# Patient Record
Sex: Female | Born: 1965
Health system: Southern US, Community
[De-identification: ages and names within clinical notes are randomized; demographics above are authoritative.]

## PROBLEM LIST (undated history)

## (undated) DIAGNOSIS — B009 Herpesviral infection, unspecified: Secondary | ICD-10-CM

## (undated) DIAGNOSIS — E785 Hyperlipidemia, unspecified: Secondary | ICD-10-CM

## (undated) DIAGNOSIS — R112 Nausea with vomiting, unspecified: Secondary | ICD-10-CM

## (undated) DIAGNOSIS — K635 Polyp of colon: Secondary | ICD-10-CM

## (undated) DIAGNOSIS — J45909 Unspecified asthma, uncomplicated: Secondary | ICD-10-CM

## (undated) DIAGNOSIS — D649 Anemia, unspecified: Secondary | ICD-10-CM

## (undated) DIAGNOSIS — T7840XA Allergy, unspecified, initial encounter: Secondary | ICD-10-CM

## (undated) DIAGNOSIS — K219 Gastro-esophageal reflux disease without esophagitis: Secondary | ICD-10-CM

## (undated) DIAGNOSIS — M1990 Unspecified osteoarthritis, unspecified site: Secondary | ICD-10-CM

## (undated) DIAGNOSIS — Z9889 Other specified postprocedural states: Secondary | ICD-10-CM

## (undated) DIAGNOSIS — J302 Other seasonal allergic rhinitis: Secondary | ICD-10-CM

## (undated) HISTORY — DX: Nausea with vomiting, unspecified: R11.2

## (undated) HISTORY — DX: Hyperlipidemia, unspecified: E78.5

## (undated) HISTORY — PX: SHOULDER ARTHROSCOPY: SHX128

## (undated) HISTORY — PX: UPPER GASTROINTESTINAL ENDOSCOPY: SHX188

## (undated) HISTORY — DX: Herpesviral infection, unspecified: B00.9

## (undated) HISTORY — DX: Other specified postprocedural states: Z98.890

## (undated) HISTORY — DX: Anemia, unspecified: D64.9

## (undated) HISTORY — PX: COLONOSCOPY: SHX174

## (undated) HISTORY — DX: Unspecified osteoarthritis, unspecified site: M19.90

## (undated) HISTORY — PX: WISDOM TOOTH EXTRACTION: SHX21

## (undated) HISTORY — DX: Polyp of colon: K63.5

## (undated) HISTORY — DX: Allergy, unspecified, initial encounter: T78.40XA

## (undated) HISTORY — DX: Unspecified asthma, uncomplicated: J45.909

## (undated) HISTORY — PX: ABDOMINAL HYSTERECTOMY: SHX81

## (undated) HISTORY — PX: DILATION AND CURETTAGE OF UTERUS: SHX78

## (undated) SURGERY — Surgical Case
Anesthesia: *Unknown

---

## 1997-08-07 ENCOUNTER — Emergency Department (HOSPITAL_COMMUNITY): Admission: EM | Admit: 1997-08-07 | Discharge: 1997-08-07 | Payer: Self-pay | Admitting: Emergency Medicine

## 1998-11-18 ENCOUNTER — Emergency Department (HOSPITAL_COMMUNITY): Admission: EM | Admit: 1998-11-18 | Discharge: 1998-11-19 | Payer: Self-pay | Admitting: Emergency Medicine

## 1999-07-24 ENCOUNTER — Other Ambulatory Visit: Admission: RE | Admit: 1999-07-24 | Discharge: 1999-07-24 | Payer: Self-pay | Admitting: Obstetrics and Gynecology

## 2000-07-21 ENCOUNTER — Other Ambulatory Visit: Admission: RE | Admit: 2000-07-21 | Discharge: 2000-07-21 | Payer: Self-pay | Admitting: *Deleted

## 2001-10-31 ENCOUNTER — Other Ambulatory Visit: Admission: RE | Admit: 2001-10-31 | Discharge: 2001-10-31 | Payer: Self-pay | Admitting: Obstetrics and Gynecology

## 2002-04-05 HISTORY — PX: OVARIAN CYST REMOVAL: SHX89

## 2002-11-02 ENCOUNTER — Other Ambulatory Visit: Admission: RE | Admit: 2002-11-02 | Discharge: 2002-11-02 | Payer: Self-pay | Admitting: Obstetrics and Gynecology

## 2003-03-11 ENCOUNTER — Inpatient Hospital Stay (HOSPITAL_COMMUNITY): Admission: RE | Admit: 2003-03-11 | Discharge: 2003-03-13 | Payer: Self-pay | Admitting: Obstetrics and Gynecology

## 2003-03-11 ENCOUNTER — Encounter (INDEPENDENT_AMBULATORY_CARE_PROVIDER_SITE_OTHER): Payer: Self-pay | Admitting: *Deleted

## 2003-03-11 ENCOUNTER — Encounter (INDEPENDENT_AMBULATORY_CARE_PROVIDER_SITE_OTHER): Payer: Self-pay

## 2003-04-16 ENCOUNTER — Encounter: Admission: RE | Admit: 2003-04-16 | Discharge: 2003-04-16 | Payer: Self-pay | Admitting: Obstetrics and Gynecology

## 2004-02-03 ENCOUNTER — Ambulatory Visit: Payer: Self-pay

## 2004-10-16 ENCOUNTER — Ambulatory Visit: Payer: Self-pay | Admitting: Internal Medicine

## 2005-09-30 ENCOUNTER — Encounter: Admission: RE | Admit: 2005-09-30 | Discharge: 2005-09-30 | Payer: Self-pay | Admitting: Internal Medicine

## 2006-01-14 ENCOUNTER — Encounter (INDEPENDENT_AMBULATORY_CARE_PROVIDER_SITE_OTHER): Payer: Self-pay | Admitting: Specialist

## 2006-01-14 ENCOUNTER — Ambulatory Visit (HOSPITAL_COMMUNITY): Admission: RE | Admit: 2006-01-14 | Discharge: 2006-01-14 | Payer: Self-pay | Admitting: Obstetrics and Gynecology

## 2006-10-03 ENCOUNTER — Encounter: Admission: RE | Admit: 2006-10-03 | Discharge: 2006-10-03 | Payer: Self-pay | Admitting: Internal Medicine

## 2007-07-05 ENCOUNTER — Ambulatory Visit: Payer: Self-pay | Admitting: Internal Medicine

## 2007-07-17 ENCOUNTER — Ambulatory Visit: Payer: Self-pay | Admitting: Internal Medicine

## 2007-08-04 ENCOUNTER — Ambulatory Visit: Payer: Self-pay | Admitting: Internal Medicine

## 2007-10-05 ENCOUNTER — Encounter: Admission: RE | Admit: 2007-10-05 | Discharge: 2007-10-05 | Payer: Self-pay | Admitting: Internal Medicine

## 2007-11-14 ENCOUNTER — Other Ambulatory Visit: Payer: Self-pay

## 2007-11-14 ENCOUNTER — Emergency Department: Payer: Self-pay | Admitting: Emergency Medicine

## 2007-12-01 ENCOUNTER — Ambulatory Visit (HOSPITAL_COMMUNITY): Admission: RE | Admit: 2007-12-01 | Discharge: 2007-12-01 | Payer: Self-pay | Admitting: Obstetrics and Gynecology

## 2007-12-01 ENCOUNTER — Encounter (INDEPENDENT_AMBULATORY_CARE_PROVIDER_SITE_OTHER): Payer: Self-pay | Admitting: Obstetrics and Gynecology

## 2008-01-24 ENCOUNTER — Ambulatory Visit: Payer: Self-pay | Admitting: Internal Medicine

## 2008-02-15 ENCOUNTER — Ambulatory Visit: Payer: Self-pay | Admitting: Gastroenterology

## 2008-10-10 ENCOUNTER — Encounter: Admission: RE | Admit: 2008-10-10 | Discharge: 2008-10-10 | Payer: Self-pay | Admitting: Internal Medicine

## 2008-10-10 ENCOUNTER — Encounter: Admission: RE | Admit: 2008-10-10 | Discharge: 2008-10-10 | Payer: Self-pay | Admitting: Orthopedic Surgery

## 2008-12-05 ENCOUNTER — Ambulatory Visit (HOSPITAL_BASED_OUTPATIENT_CLINIC_OR_DEPARTMENT_OTHER): Admission: RE | Admit: 2008-12-05 | Discharge: 2008-12-05 | Payer: Self-pay | Admitting: Orthopedic Surgery

## 2009-02-03 ENCOUNTER — Ambulatory Visit: Payer: Self-pay | Admitting: Internal Medicine

## 2009-02-17 ENCOUNTER — Ambulatory Visit: Payer: Self-pay | Admitting: Gastroenterology

## 2009-02-18 ENCOUNTER — Ambulatory Visit: Payer: Self-pay | Admitting: Internal Medicine

## 2009-03-05 ENCOUNTER — Ambulatory Visit: Payer: Self-pay | Admitting: Internal Medicine

## 2009-03-18 ENCOUNTER — Ambulatory Visit: Payer: Self-pay | Admitting: Gastroenterology

## 2009-04-05 ENCOUNTER — Ambulatory Visit: Payer: Self-pay | Admitting: Internal Medicine

## 2009-04-16 ENCOUNTER — Ambulatory Visit: Payer: Self-pay | Admitting: Internal Medicine

## 2009-05-06 ENCOUNTER — Ambulatory Visit: Payer: Self-pay | Admitting: Internal Medicine

## 2009-07-04 ENCOUNTER — Encounter: Admission: RE | Admit: 2009-07-04 | Discharge: 2009-07-04 | Payer: Self-pay | Admitting: Orthopedic Surgery

## 2009-08-30 ENCOUNTER — Ambulatory Visit: Payer: Self-pay | Admitting: Internal Medicine

## 2009-10-13 ENCOUNTER — Encounter: Admission: RE | Admit: 2009-10-13 | Discharge: 2009-10-13 | Payer: Self-pay | Admitting: Internal Medicine

## 2010-07-10 LAB — POCT HEMOGLOBIN-HEMACUE: Hemoglobin: 13.2 g/dL (ref 12.0–15.0)

## 2010-08-18 NOTE — Op Note (Signed)
NAME:  JERSEE, WINIARSKI NO.:  [id number]   MEDICAL RECORD NO.:  [id number]          PATIENT TYPE:  AMB   LOCATION:  DSC                          FACILITY:  [facility]   PHYSICIAN:  Loreta Ave, M.D. DATE OF BIRTH:  [birth year]   DATE OF PROCEDURE:  12/05/2008  DATE OF DISCHARGE:                               OPERATIVE REPORT   PREOPERATIVE DIAGNOSES:  1. Left shoulder impingement.  2. Distal clavicle osteolysis.  3. Rotator cuff tendonitis.   POSTOPERATIVE DIAGNOSES:  1. Left shoulder impingement.  2. Distal clavicle osteolysis.  3. Rotator cuff tendonitis.  4. Some mild tearing, superior labrum.   PROCEDURE:  Left shoulder exam under anesthesia arthroscopy, debridement  of labrum.  Bursectomy, acromioplasty, and coracoacromial ligament  release.  Excision, distal clavicle.   SURGEON:  Loreta Ave, MD   ASSISTANT:  Genene Churn. Barry Dienes, [place]   ANESTHESIA:  General.   BLOOD LOSS:  Minimal.   SPECIMENS:  None.   CULTURES:  None.   COMPLICATIONS:  None.   DRESSING:  Soft compressive with sling.   PROCEDURE:  The patient was brought to the operating room and placed on  the operating table in supine position.  After adequate anesthesia had  been obtained, shoulder examined.  Full motion and stable shoulder.  Placed in beach-chair position on the shoulder positioner and prepped  and draped in usual sterile fashion.  Three portals anterior, posterior,  and lateral.  Shoulder entered with blunt obturator.  Arthroscope  introduced, shoulder distended and inspected.  Little fraying of the top  of labrum debrided.  Biceps tendon, biceps anchor, undersurface cuff,  articular cartilage, and capsule ligamentous structures all intact.  Cannula redirected subacromially.  Roughening on the top of the cuff  debrided.  Adhesive bursitis resected.  Acromioplasty from type 3 to a  type 1 acromion with shaver and high-speed bur releasing CA ligament  with cautery.   Distal clavicle exposed.  Marked spurring grade 4 changes  osteolysis.  Periarticular spurs and lateral centimeter of clavicle  resected.  Adequacy of decompression and  debridement confirmed.  Instruments and fluid removed.  Portals,  shoulder, and bursa injected with Marcaine.  Portals closed with 4-0  nylon.  Sterile compressive dressing applied.  Anesthesia reversed.  Brought recovery room.  Tolerated surgery well.  No complications.      Loreta Ave, M.D.  Electronically Signed     [facility]/MEDQ  D:  12/05/2008  T:  12/06/2008  Job:  [id number]

## 2010-08-18 NOTE — Op Note (Signed)
NAME:  [name], Kelly LUFF NO.:  [id number]   MEDICAL RECORD NO.:  [id number]          PATIENT TYPE:  AMB   LOCATION:  SDC                           [facility]   PHYSICIAN:  Maxie Better, M.D.DATE OF BIRTH:  18-Jul-1965   DATE OF PROCEDURE:  12/01/2007  DATE OF DISCHARGE:                               OPERATIVE REPORT   PREOPERATIVE DIAGNOSIS:  Missed abortion.   PROCEDURE:  Suction dilation and  evacuation.   POSTOPERATIVE DIAGNOSIS:  Missed abortion.   ANESTHESIA:  MAC paracervical block.   SURGEON:  Maxie Better, MD   PROCEDURE:  Under adequate monitored anesthesia, the patient was placed  in a dorsal lithotomy position.  She was sterilely prepped and draped in  usual fashion.  Bladder was catheterized, small amount of urine.  Examination under anesthesia revealed a 8-week size uterus with a  palpable posterior cystic mass known to be a left ovarian cyst.  Bivalve  speculum was placed into the vagina, 200 mL of 1% Nesacaine was injected  at 3 and 9 o'clock para cervically.  The cervix was then clamped.  Anterior lip, cervix dilated up to #27 Pratt dilator and #7 mm curved  suction cannula was introduced in the uterine cavity.  Moderate amount  of products of conception was obtained.  Cavity was suctioned, curetted  suctioned until all tissue was felt to been removed; at which time, all  instruments were then removed from the vagina.  Specimen labeled  products of conception was sent to pathology.  Estimated blood loss was  minimal.  Complication was none.  The patient tolerated the procedure  well, and was transferred to recovery in stable condition.      Maxie Better, M.D.  Electronically Signed     [place]/MEDQ  D:  12/01/2007  T:  12/02/2007  Job:  [id number]

## 2010-08-21 NOTE — Op Note (Signed)
NAME:  Kelly Massey, Kelly Massey NO.:  [id number]   MEDICAL RECORD NO.:  [id number]          PATIENT TYPE:  AMB   LOCATION:  SDC                           [facility]   PHYSICIAN:  Maxie Better, M.D.DATE OF BIRTH:  [birth year]   DATE OF PROCEDURE:  01/14/2006  DATE OF DISCHARGE:                                 OPERATIVE REPORT   PREOPERATIVE DIAGNOSIS:  Dysfunctional uterine bleeding, endometrial mass.   PROCEDURE:  Diagnostic hysteroscopy, hysteroscopic resection of endometrial  polyp, dilation and curettage.   POSTOPERATIVE DIAGNOSIS:  Dysfunction uterine bleeding and endometrial  polyps.   ANESTHESIA:  General, paracervical block.   SURGEON:  Maxie Better, M.D.   PROCEDURE:  Under adequate general anesthesia the patient is placed in  dorsal lithotomy position.  She was sterilely prepped and draped in usual  fashion.  The bladder was catheterized for small amount of urine.  Examination under anesthesia revealed anteverted uterus, no adnexal masses  could be appreciated.  A bivalve speculum placed in the vagina.  Single-  tooth tenaculum was placed on the anterior lip of the cervix.  10 mL of one  percent Nesicaine was injected paracervically at 3 and 9 o'clock. The cervix  was then serially dilated up to #21 [facility] dilator.  A diagnostic  hysteroscope was introduced, both tubal ostia could be seen.  Two polypoid  lesions were noted adjacent to each other on the anterior  uterine wall.  No  lesions in the cervical canal was noted.  The hysteroscope was removed, the  cervix was then further dilated up to #25 [facility] dilator.  A hysteroscope  with a single loop was then introduced into the uterus.  Both polypoid  lesions were removed/resected without incident.  The hysteroscope was  removed.  The cavity was curetted for moderate amount of tissue.  The  hysteroscope was then reinserted to inspect the cavity.  Additional polypoid  lesion noted in the left upper  anterior wall and this was resected.  When  that was done the cavity was without any polypoid lesions.  The resectoscope  was then removed, the cavity curetted and then all instruments were then  removed from the vagina.  Specimen labeled endometrial curettings and  endometrial polyps was sent to pathology.  Estimated blood loss was minimal.  Fluid deficit was 130 mL.  Complications none.  The patient tolerated the  procedure, was transferred to recovery in stable condition.      Maxie Better, M.D.  Electronically Signed     [place]/MEDQ  D:  01/14/2006  T:  01/17/2006  Job:  [id number]

## 2010-08-21 NOTE — H&P (Signed)
NAME:  Kelly Massey, Kelly Massey                         ACCOUNT NO.:  [id number]   MEDICAL RECORD NO.:  [id number]                   PATIENT TYPE:  INP   LOCATION:  NA                                   [facility]   PHYSICIAN:  Maxie Better, M.D.            DATE OF BIRTH:  [birth year]   DATE OF ADMISSION:  03/11/2003  DATE OF DISCHARGE:                                HISTORY & PHYSICAL   CHIEF COMPLAINT:  Large left ovarian cyst, heavy menses.   HISTORY OF PRESENT ILLNESS:  This is a 45 year old G0, married black female,  last menstrual period of November 2004, who is now being admitted for  exploratory laparotomy and left ovarian cystectomy, diagnostic hysteroscopy  and D&C secondary to a persistent left ovarian cyst and menorrhagia.  The  patient underwent an ultrasound on November 26, 2002, for enlarged uterus.  Findings at that time were a normal right ovary, left ovary with an 8.3 x  5.2 x 7.5 cm simple cyst.  The uterus was normal.  Follow-up ultrasound on  January 16, 2003, showed that this cyst has persisted and measured 7.3 x 5.3  x 8.5 cm.  The right ovary had simple follicular cyst at that time.  Incidentally, over the past four months the patient has noted heavy menses  with severe dysmenorrhea requiring her missing work.  The patient has also  passed clots.  Her cycles are otherwise on a regular basis.  Since the  findings of the left ovarian cyst, the patient had noted some left-sided  discomfort.  She has had normal bowel movements, no nausea or vomiting, and  no prior history of any pelvic inflammatory disease.   PAST MEDICAL HISTORY:  Allergies to PENICILLIN, IBUPROFEN.   Medication is Protonix, multivitamin, Zyrtec.   Past medical history of GERD, hyperlipidemia, seasonal allergies,  leukopenia, cervical stenosis.   Her surgical history is negative.   FAMILY HISTORY:  Maternal aunts with breast cancer. No ovarian or colon  cancer.   SOCIAL HISTORY:  No  children.  Nonsmoker.  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   REVIEW OF SYSTEMS:  Negative except as noted in the history of present  illness.   PHYSICAL EXAMINATION:  GENERAL:  A well-developed, well-nourished black  female in no acute distress.  VITAL SIGNS:  Blood pressure 110/78, temperature of 98.6, weight of 173  pounds.  SKIN:  No lesions.  HEENT:  Anicteric sclerae, pink conjunctivae, oropharynx negative.  CARDIAC:  Regular rate and rhythm without murmur.  BREASTS:  Soft, nontender, no palpable mass.  CHEST:  Lungs are clear to auscultation.  LYMPHATIC:  No palpable supraclavicular, inguinal, or axillary nodes.  ABDOMEN:  Soft, nondistended, no organomegaly.  PELVIC:  Vulva showed no lesions.  The vagina had no discharge.  Cervix was  pinpoint os, no lesion.  The uterus was anteverted, normal, deviated to the  right.  Adnexa:  Right nontender,  no palpable mass; left, fullness, slightly  tender.  RECTAL:  Deferred.   IMPRESSION:  1. Persistent left simple ovarian cyst.  2. Menorrhagia.   PLAN:  Exploratory laparotomy, left ovarian cystectomy, D&C hysteroscopy.  The procedures are reviewed in detail.  Risk discussed including but not  limited to infection, bleeding, possible loss of the left ovary along with  the tube, injury to surrounding organ structures, uterine perforation, fluid  overload and its management, thermal injury, the possible need for a blood  transfusion.  Risk of blood transfusion is discussed.  Anti-embolic  stockings, antibiotic prophylaxis, postop care, and criteria for discharge  were reviewed.  All questions answered.                                               Maxie Better, M.D.    [place]/MEDQ  D:  02/27/2003  T:  02/27/2003  Job:  [id number]

## 2010-08-21 NOTE — Discharge Summary (Signed)
NAME:  Kelly Massey, Kelly Massey                         ACCOUNT NO.:  [id number]   MEDICAL RECORD NO.:  [id number]                   PATIENT TYPE:  INP   LOCATION:  9303                                 [facility]ILITY[facility]   PHYSICIAN:  Maxie Better, M.D.            DATE OF BIRTH:  1965/06/12   DATE OF ADMISSION:  03/11/2003  DATE OF DISCHARGE:  03/13/2003                                 DISCHARGE SUMMARY   ADMISSION DIAGNOSES:  1. Persistent simple left ovarian cyst.  2. Menorrhagia.   DISCHARGE DIAGNOSES:  1. Menorrhagia.  2. Left paratubal cyst.   PROCEDURE:  Diagnostic hysteroscopy, dilatation and curettage, exploratory  laparotomy, left paratubal cystectomy.   HISTORY OF PRESENT ILLNESS:  This is a 45 year old female with menorrhagia  and persistent simple __________ left ovarian cyst who was admitted for  surgical evaluation and management.   HOSPITAL COURSE:  The patient was admitted to [facility]. She was  taken to the operating room where she underwent a D&C, diagnostic  hysteroscopy, exploratory laparotomy, and left paratubal cystectomy. Both  tubal ostia were seen at the time of hysteroscopy. No endometrial lesions  were noted. Normal ovaries were noted bilaterally. Normal right tube. The  left tube with an approximately 8 cm paratubal cyst. Retrocecal appendix was  noted. The patient's postoperative course was notable for a temperature of  100.8 on postoperative day #1. CBC on postoperative day #1 showed hemoglobin  of 11.8, hematocrit 34.5, white count 8.4.  The low-grade temperature was  felt secondary to her atelectasis and incentive spirometry was used. The  patient subsequently defervesced, was tolerating a regular diet, passing  flatus. The incision, which has staples, had no erythema, induration, or  exudates. The abdomen was nondistended with active bowel sounds. She was  deemed well to be discharged after 24 hours without any fever. The pathology  consists of  a 9.4 cm left paratubal cyst. The patient, having remained  afebrile, was deemed well to be discharged home.   DISPOSITION:  Home.   CONDITION ON DISCHARGE:  Stable.   DISCHARGE MEDICATIONS:  1. Tylox #20 one to two tablets q.24h. p.r.n. pain.  2. Motrin 800 mg one p.o. q.4-6h. p.r.n. pain.   DISCHARGE INSTRUCTIONS:  Call if temperature greater or equal to 100.4,  nothing in the vagina for four to six weeks, no heavy lifting or driving for  two weeks, __________ frequently, redness or drainage from the incision  site.   FOLLOWUP APPOINTMENT:  At [facility] at four to six weeks postoperative  and for staple removal in the office on March 18, 2003.                                               Maxie Better, M.D.    [place]/MEDQ  D:  04/27/2003  T:  04/27/2003  Job:  [id number]

## 2010-08-21 NOTE — Op Note (Signed)
NAME:  Kelly Massey, Kelly Massey                         ACCOUNT NO.:  [id number]   MEDICAL RECORD NO.:  [id number]                   PATIENT TYPE:  INP   LOCATION:  9303                                 [facility]   PHYSICIAN:  Maxie Better, M.D.            DATE OF BIRTH:  [birth year]   DATE OF PROCEDURE:  03/11/2003  DATE OF DISCHARGE:                                 OPERATIVE REPORT   PREOPERATIVE DIAGNOSES:  1. Persistent simple left ovarian cyst.  2. Menorrhagia.   PROCEDURES:  1. Diagnostic hysteroscopy.  2. Dilation and curettage.  3. Exploratory laparotomy.  4. Left paratubal cystectomy.   POSTOPERATIVE DIAGNOSES:  1. Left paratubal cyst.  2. Menorrhagia.   ANESTHESIA:  General.   SURGEON:  Maxie Better, M.D.   ASSISTANT:  Pershing Cox, M.D.   INDICATIONS:  This is a 45 year old female, last menstrual period is  February 28, 2003, with menorrhagia and a persistent simple left ovarian  cyst, who now presents for surgical evaluation and management.  The patient  underwent an ultrasound for exam suggestive of an enlarged uterus and was  found to have a 7 cm simple left ovarian cyst.  This was followed by several  ultrasounds and had remained large in size, with the last ultrasound being  done on December 3, which showed the cyst approximately 9 cm and simple.  The patient also complained of heavy menses.  Ultrasound did not reveal an  intracavitary lesion.  She had two small fibroids on ultrasound and the  patient desires to further evaluate this at the same time.  Risks and  benefits of the procedure have been explained.  Consent was signed.  The  patient was transferred to the operating room.   DESCRIPTION OF PROCEDURE:  Under adequate general anesthesia the patient was  placed in the dorsal lithotomy position, her abdomen, perineum, and vagina  were sterilely prepped and draped in the usual fashion for a laparoscopic-  assisted procedure.  An  indwelling Foley catheter was sterilely placed.  Examination under anesthesia revealed a small, anteverted uterus deviated to  the right with a palpable large 8 cm mass, nonmobile, in the left adnexa.  The right adnexa was without any palpable mass.  A bivalve speculum was  placed in the vagina, a single-tooth tenaculum was placed on the anterior  lip of the cervix.  The cervix was then serially dilated up to a #25 Pratt  dilator.  A diagnostic hysteroscope was introduced into the uterine cavity  without incident.  Both tubal ostia could be seen.  The left side wall of  the uterine cavity was suggestive of an indentation or external compression.  There was the suggestion of an anterior right mass.  No other lesions were  seen, the endocervical lesion noted.  The diagnostic hysteroscope was  removed.  The cavity was then curetted for a moderate amount of tissue.  Reinsertion of the diagnostic  hysteroscope showed the possibility of a right  anterior mass.  A decision was therefore made to remove the diagnostic  hysteroscope, further dilate the cervix up to #31 [facility] dilator, and a  resectoscope with a double loop was introduced into the cavity without  incident.  At this point the cavity was further distended and no  intracavitary mass was noted.  The double loop was used to palpate really  the anterior endometrial cavity, and no suggestion of an indentation was  noted.  At that point the resectoscope was then removed.  The cavity was  once again curetted for additional tissue, and the bivalve speculum was  removed.  Using the sterile technique, attention was then turned to the  abdomen.  Marcaine 0.25% 9 mL was injected along the planned Pfannenstiel  skin incision.  A Pfannenstiel skin incision was then made, carried down to  the rectus fascia using Bovie cautery.  The rectus fascia was incised in the  midline and extended bilaterally.  The rectus fascia was then bluntly and  with cautery  dissected off the rectus muscle in a superior and inferior  fashion.  The rectus muscle was split in the midline.  The parietal  peritoneum was entered bluntly, at which time 500 mL of sterile fluid was  introduced into the abdomen for peritoneal washings, 475 mL was then  removed.  The parietal peritoneum was further opened.  The abdomen was  explored.  The upper liver edge was normal palpable, normal kidneys on  palpation.  Attention was then turned to the pelvis, where the small uterus  was noted with a 1 cm posterior subserosal fibroid seen.  No other fibroid  palpated.  Right tube and ovary were normal.  The left ovary was actually  normal, and there was a large 8+ cm mass distending the mesosalpinx area  with the left fallopian tube, other than extremely elongated, was otherwise  normal.  A self-retaining Balfour retractor was then placed.  The bowel was  displaced upwardly and the left adnexal mass was brought up into the field.  With inspection it appeared to be a clear, fluid-filled cystic mass.  The  needle-point cautery was then used to open the surface of the mass and the  cystic mass was then shelled out using Metzenbaum scissors.  This was  removed intact.  Small bleeding near the surface of the left ovary from the  removal process was then cauterized and then subsequently hemostased using 4-  0 Vicryl suture.  The disrupted undersurface of the fallopian tube with  respect to the adventitial area was closed with interrupted 4-0 Vicryl  sutures.  With good hemostasis then noted and no other lesions noted, the  abdomen was irrigated and suctioned of debris.  The retractors were removed.  The packings were removed.  The appendix appears to be normal but slightly  retrocecal.  The parietal peritoneum was not closed.  The undersurface of  the rectus fascia was inspected, small bleeders cauterized.  The rectus fascia was closed with 0 Vicryl x2.  The skin was approximated using  Ethicon  staples after the subcutaneous area was irrigated and small bleeders  cauterized.   SPECIMENS:  The ovarian cystic mass, endometrial curettings.   ESTIMATED BLOOD LOSS:  About 100 mL.   INTRAOPERATIVE FLUIDS REPLACED:  2 L.   URINE OUTPUT:  200 mL clear yellow urine.   Sponge and instrument counts x2 were correct.   COMPLICATIONS:  None.   The patient  tolerated the procedure well and was transferred to the recovery  room in stable condition.                                               Maxie Better, M.D.    [place]/MEDQ  D:  03/11/2003  T:  03/12/2003  Job:  [id number]

## 2010-09-02 ENCOUNTER — Other Ambulatory Visit: Payer: Self-pay | Admitting: Internal Medicine

## 2010-09-02 DIAGNOSIS — Z1231 Encounter for screening mammogram for malignant neoplasm of breast: Secondary | ICD-10-CM

## 2010-10-12 ENCOUNTER — Ambulatory Visit: Payer: Self-pay | Admitting: Gastroenterology

## 2010-10-12 DIAGNOSIS — K219 Gastro-esophageal reflux disease without esophagitis: Secondary | ICD-10-CM

## 2010-10-12 HISTORY — DX: Gastro-esophageal reflux disease without esophagitis: K21.9

## 2010-10-12 LAB — HM COLONOSCOPY

## 2010-10-14 LAB — PATHOLOGY REPORT

## 2010-10-15 ENCOUNTER — Ambulatory Visit
Admission: RE | Admit: 2010-10-15 | Discharge: 2010-10-15 | Disposition: A | Discharge status: Home / Self Care | Payer: 59 | Source: Ambulatory Visit | Attending: Internal Medicine | Admitting: Internal Medicine

## 2010-10-15 DIAGNOSIS — Z1231 Encounter for screening mammogram for malignant neoplasm of breast: Secondary | ICD-10-CM

## 2010-10-16 ENCOUNTER — Other Ambulatory Visit: Payer: Self-pay | Admitting: Internal Medicine

## 2010-10-16 DIAGNOSIS — R928 Other abnormal and inconclusive findings on diagnostic imaging of breast: Secondary | ICD-10-CM

## 2010-10-22 ENCOUNTER — Ambulatory Visit
Admission: RE | Admit: 2010-10-22 | Discharge: 2010-10-22 | Disposition: A | Discharge status: Home / Self Care | Payer: 59 | Source: Ambulatory Visit | Attending: Internal Medicine | Admitting: Internal Medicine

## 2010-10-22 DIAGNOSIS — R928 Other abnormal and inconclusive findings on diagnostic imaging of breast: Secondary | ICD-10-CM

## 2011-07-21 ENCOUNTER — Ambulatory Visit: Payer: Self-pay | Admitting: Gastroenterology

## 2011-07-22 ENCOUNTER — Ambulatory Visit: Payer: Self-pay | Admitting: Gastroenterology

## 2011-10-04 ENCOUNTER — Encounter (HOSPITAL_COMMUNITY): Payer: Self-pay | Admitting: Pharmacist

## 2011-10-15 ENCOUNTER — Other Ambulatory Visit: Payer: Self-pay | Admitting: Obstetrics and Gynecology

## 2011-10-15 ENCOUNTER — Encounter (HOSPITAL_COMMUNITY): Payer: Self-pay

## 2011-10-15 ENCOUNTER — Encounter (HOSPITAL_COMMUNITY)
Admission: RE | Admit: 2011-10-15 | Discharge: 2011-10-15 | Disposition: A | Discharge status: Home / Self Care | Payer: 59 | Source: Ambulatory Visit | Attending: Obstetrics and Gynecology | Admitting: Obstetrics and Gynecology

## 2011-10-15 HISTORY — DX: Gastro-esophageal reflux disease without esophagitis: K21.9

## 2011-10-15 LAB — CBC
HCT: 39.5 % (ref 36.0–46.0)
Hemoglobin: 13 g/dL (ref 12.0–15.0)
MCH: 30.1 pg (ref 26.0–34.0)
MCHC: 32.9 g/dL (ref 30.0–36.0)
MCV: 91.4 fL (ref 78.0–100.0)
Platelets: 299 10*3/uL (ref 150–400)
RBC: 4.32 MIL/uL (ref 3.87–5.11)
RDW: 13.9 % (ref 11.5–15.5)
WBC: 4.5 10*3/uL (ref 4.0–10.5)

## 2011-10-15 LAB — SURGICAL PCR SCREEN
MRSA, PCR: NEGATIVE
Staphylococcus aureus: NEGATIVE

## 2011-10-15 NOTE — Patient Instructions (Addendum)
20 Kelly Massey  10/15/2011   Your procedure is scheduled on:  10/21/11  Enter through the Main Entrance of [facility] at 6 AM.  Pick up the phone at the desk and dial [phone].   Call this number if you have problems the morning of surgery: [phone]   Remember:   Do not eat food:After Midnight.  Do not drink clear liquids: After Midnight.  Take these medicines the morning of surgery with A SIP OF WATER: Dexilant   Do not wear jewelry, make-up or nail polish.  Do not wear lotions, powders, or perfumes. You may wear deodorant.  Do not shave 48 hours prior to surgery.  Do not bring valuables to the hospital.  Contacts, dentures or bridgework may not be worn into surgery.  Leave suitcase in the car. After surgery it may be brought to your room.  For patients admitted to the hospital, checkout time is 11:00 AM the day of discharge.   Patients discharged the day of surgery will not be allowed to drive home.  Name and phone number of your driver: na  Special Instructions: CHG Shower Use Special Wash: 1/2 bottle night before surgery and 1/2 bottle morning of surgery.   Please read over the following fact sheets that you were given: MRSA Information

## 2011-10-20 MED ORDER — CIPROFLOXACIN IN D5W 400 MG/200ML IV SOLN
400.0000 mg | INTRAVENOUS | Status: AC
  Administered 2011-10-21: 400 mg via INTRAVENOUS
  Filled 2011-10-20: qty 200

## 2011-10-20 MED ORDER — CLINDAMYCIN PHOSPHATE 900 MG/50ML IV SOLN
900.0000 mg | INTRAVENOUS | Status: AC
  Administered 2011-10-21: 900 mg via INTRAVENOUS
  Filled 2011-10-20: qty 50

## 2011-10-21 ENCOUNTER — Ambulatory Visit (HOSPITAL_COMMUNITY): Payer: 59 | Admitting: Anesthesiology

## 2011-10-21 ENCOUNTER — Observation Stay (HOSPITAL_COMMUNITY)
Admission: RE | Admit: 2011-10-21 | Discharge: 2011-10-21 | Disposition: A | Discharge status: Home / Self Care | Payer: 59 | Source: Ambulatory Visit | Attending: Obstetrics and Gynecology | Admitting: Obstetrics and Gynecology

## 2011-10-21 ENCOUNTER — Encounter (HOSPITAL_COMMUNITY): Payer: Self-pay | Admitting: *Deleted

## 2011-10-21 ENCOUNTER — Encounter (HOSPITAL_COMMUNITY): Payer: Self-pay | Admitting: Anesthesiology

## 2011-10-21 ENCOUNTER — Encounter (HOSPITAL_COMMUNITY)
Admission: RE | Disposition: A | Discharge status: Home / Self Care | Payer: Self-pay | Source: Ambulatory Visit | Attending: Obstetrics and Gynecology

## 2011-10-21 DIAGNOSIS — N803 Endometriosis of pelvic peritoneum, unspecified: Secondary | ICD-10-CM | POA: Insufficient documentation

## 2011-10-21 DIAGNOSIS — N946 Dysmenorrhea, unspecified: Secondary | ICD-10-CM | POA: Insufficient documentation

## 2011-10-21 DIAGNOSIS — D250 Submucous leiomyoma of uterus: Secondary | ICD-10-CM | POA: Insufficient documentation

## 2011-10-21 DIAGNOSIS — D252 Subserosal leiomyoma of uterus: Secondary | ICD-10-CM | POA: Insufficient documentation

## 2011-10-21 DIAGNOSIS — D251 Intramural leiomyoma of uterus: Secondary | ICD-10-CM | POA: Insufficient documentation

## 2011-10-21 DIAGNOSIS — Z90710 Acquired absence of both cervix and uterus: Secondary | ICD-10-CM

## 2011-10-21 DIAGNOSIS — N920 Excessive and frequent menstruation with regular cycle: Principal | ICD-10-CM | POA: Insufficient documentation

## 2011-10-21 LAB — BASIC METABOLIC PANEL
BUN: 6 mg/dL (ref 6–23)
BUN: 5 mg/dL — ABNORMAL LOW (ref 6–23)
CO2: 29 mEq/L (ref 19–32)
CO2: 26 mEq/L (ref 19–32)
Calcium: 9.2 mg/dL (ref 8.4–10.5)
Calcium: 8.6 mg/dL (ref 8.4–10.5)
Chloride: 101 mEq/L (ref 96–112)
Chloride: 99 mEq/L (ref 96–112)
Creatinine, Ser: 0.83 mg/dL (ref 0.50–1.10)
Creatinine, Ser: 0.71 mg/dL (ref 0.50–1.10)
GFR calc Af Amer: >90 mL/min (ref >90)
GFR calc Af Amer: >90 mL/min (ref >90)
GFR calc non Af Amer: 83 mL/min — ABNORMAL LOW (ref >90)
GFR calc non Af Amer: >90 mL/min (ref >90)
Glucose, Bld: 94 mg/dL (ref 70–99)
Glucose, Bld: 163 mg/dL — ABNORMAL HIGH (ref 70–99)
Potassium: 3.7 mEq/L (ref 3.5–5.1)
Potassium: 4.6 mEq/L (ref 3.5–5.1)
Sodium: 138 mEq/L (ref 135–145)
Sodium: 134 mEq/L — ABNORMAL LOW (ref 135–145)

## 2011-10-21 LAB — CBC
HCT: 41.4 % (ref 36.0–46.0)
HCT: 38.3 % (ref 36.0–46.0)
Hemoglobin: 13.5 g/dL (ref 12.0–15.0)
Hemoglobin: 12.7 g/dL (ref 12.0–15.0)
MCH: 29.9 pg (ref 26.0–34.0)
MCH: 30.2 pg (ref 26.0–34.0)
MCHC: 32.6 g/dL (ref 30.0–36.0)
MCHC: 33.2 g/dL (ref 30.0–36.0)
MCV: 91.8 fL (ref 78.0–100.0)
MCV: 91.2 fL (ref 78.0–100.0)
Platelets: 292 10*3/uL (ref 150–400)
Platelets: 247 10*3/uL (ref 150–400)
RBC: 4.51 MIL/uL (ref 3.87–5.11)
RBC: 4.2 MIL/uL (ref 3.87–5.11)
RDW: 13.9 % (ref 11.5–15.5)
RDW: 13.8 % (ref 11.5–15.5)
WBC: 3.6 10*3/uL — ABNORMAL LOW (ref 4.0–10.5)
WBC: 9 10*3/uL (ref 4.0–10.5)

## 2011-10-21 SURGERY — ROBOTIC ASSISTED TOTAL HYSTERECTOMY
Anesthesia: General | Site: Abdomen | Wound class: Clean Contaminated

## 2011-10-21 MED ORDER — SCOPOLAMINE 1 MG/3DAYS TD PT72
MEDICATED_PATCH | TRANSDERMAL | Status: AC
  Administered 2011-10-21: 1.5 mg
  Filled 2011-10-21: qty 1

## 2011-10-21 MED ORDER — ROCURONIUM BROMIDE 100 MG/10ML IV SOLN
INTRAVENOUS | Status: DC | PRN
  Administered 2011-10-21: 10 mg via INTRAVENOUS
  Administered 2011-10-21: 50 mg via INTRAVENOUS
  Administered 2011-10-21 (×2): 10 mg via INTRAVENOUS

## 2011-10-21 MED ORDER — ONDANSETRON HCL 4 MG/2ML IJ SOLN
INTRAMUSCULAR | Status: DC | PRN
  Administered 2011-10-21: 4 mg via INTRAVENOUS

## 2011-10-21 MED ORDER — ROCURONIUM BROMIDE 50 MG/5ML IV SOLN
INTRAVENOUS | Status: AC
Start: 2011-10-21 — End: 2011-10-21
  Filled 2011-10-21: qty 1

## 2011-10-21 MED ORDER — HYDROMORPHONE HCL PF 1 MG/ML IJ SOLN
INTRAMUSCULAR | Status: AC
  Administered 2011-10-21: 0.5 mg via INTRAVENOUS
  Filled 2011-10-21: qty 1

## 2011-10-21 MED ORDER — OXYCODONE-ACETAMINOPHEN 5-325 MG PO TABS: 1.0000 | ORAL_TABLET | ORAL | Status: DC | PRN

## 2011-10-21 MED ORDER — GLYCOPYRROLATE 0.2 MG/ML IJ SOLN
INTRAMUSCULAR | Status: DC | PRN
  Administered 2011-10-21: .4 mg via INTRAVENOUS
  Administered 2011-10-21: 0.2 mg via INTRAVENOUS
  Administered 2011-10-21: .6 mg via INTRAVENOUS

## 2011-10-21 MED ORDER — ZOLPIDEM TARTRATE 5 MG PO TABS: 5.0000 mg | ORAL_TABLET | Freq: Every evening | ORAL | Status: DC | PRN

## 2011-10-21 MED ORDER — FENTANYL CITRATE 0.05 MG/ML IJ SOLN
INTRAMUSCULAR | Status: AC
  Filled 2011-10-21: qty 5

## 2011-10-21 MED ORDER — DEXTROSE IN LACTATED RINGERS 5 % IV SOLN
INTRAVENOUS | Status: DC
  Administered 2011-10-21: 14:00:00 via INTRAVENOUS

## 2011-10-21 MED ORDER — ESMOLOL HCL 10 MG/ML IV SOLN
INTRAVENOUS | Status: DC | PRN
  Administered 2011-10-21 (×2): 20 mg via INTRAVENOUS

## 2011-10-21 MED ORDER — HYDROMORPHONE HCL PF 1 MG/ML IJ SOLN: 0.2000 mg | INTRAMUSCULAR | Status: DC | PRN

## 2011-10-21 MED ORDER — ESMOLOL HCL 10 MG/ML IV SOLN
INTRAVENOUS | Status: AC
  Filled 2011-10-21: qty 10

## 2011-10-21 MED ORDER — DEXAMETHASONE SODIUM PHOSPHATE 10 MG/ML IJ SOLN
INTRAMUSCULAR | Status: AC
  Filled 2011-10-21: qty 1

## 2011-10-21 MED ORDER — MIDAZOLAM HCL 5 MG/5ML IJ SOLN
INTRAMUSCULAR | Status: DC | PRN
  Administered 2011-10-21: 2 mg via INTRAVENOUS

## 2011-10-21 MED ORDER — ONDANSETRON HCL 4 MG/2ML IJ SOLN
4.0000 mg | Freq: Four times a day (QID) | INTRAMUSCULAR | Status: DC | PRN
  Administered 2011-10-21: 4 mg via INTRAVENOUS
  Filled 2011-10-21: qty 2

## 2011-10-21 MED ORDER — ACETAMINOPHEN 10 MG/ML IV SOLN
1000.0000 mg | Freq: Four times a day (QID) | INTRAVENOUS | Status: DC
  Administered 2011-10-21: 1000 mg via INTRAVENOUS
  Filled 2011-10-21 (×4): qty 100

## 2011-10-21 MED ORDER — MIDAZOLAM HCL 2 MG/2ML IJ SOLN
INTRAMUSCULAR | Status: AC
  Filled 2011-10-21: qty 2

## 2011-10-21 MED ORDER — BUPIVACAINE HCL (PF) 0.25 % IJ SOLN
INTRAMUSCULAR | Status: DC | PRN
  Administered 2011-10-21: 11 mL

## 2011-10-21 MED ORDER — ARTIFICIAL TEARS OP OINT
TOPICAL_OINTMENT | OPHTHALMIC | Status: AC
  Filled 2011-10-21: qty 3.5

## 2011-10-21 MED ORDER — DEXAMETHASONE SODIUM PHOSPHATE 4 MG/ML IJ SOLN
INTRAMUSCULAR | Status: DC | PRN
  Administered 2011-10-21: 10 mg via INTRAVENOUS

## 2011-10-21 MED ORDER — SCOPOLAMINE 1 MG/3DAYS TD PT72
1.0000 | MEDICATED_PATCH | Freq: Once | TRANSDERMAL | Status: DC
  Filled 2011-10-21: qty 1

## 2011-10-21 MED ORDER — MENTHOL 3 MG MT LOZG: 1.0000 | LOZENGE | OROMUCOSAL | Status: DC | PRN

## 2011-10-21 MED ORDER — ONDANSETRON HCL 4 MG/2ML IJ SOLN
INTRAMUSCULAR | Status: AC
  Filled 2011-10-21: qty 2

## 2011-10-21 MED ORDER — MEPERIDINE HCL 25 MG/ML IJ SOLN: 6.2500 mg | INTRAMUSCULAR | Status: DC | PRN

## 2011-10-21 MED ORDER — PROPOFOL 10 MG/ML IV EMUL
INTRAVENOUS | Status: AC
  Filled 2011-10-21: qty 20

## 2011-10-21 MED ORDER — ONDANSETRON HCL 4 MG PO TABS: 4.0000 mg | ORAL_TABLET | Freq: Four times a day (QID) | ORAL | Status: DC | PRN

## 2011-10-21 MED ORDER — HYDROMORPHONE HCL PF 1 MG/ML IJ SOLN
0.2500 mg | INTRAMUSCULAR | Status: DC | PRN
  Administered 2011-10-21: 0.5 mg via INTRAVENOUS

## 2011-10-21 MED ORDER — PROMETHAZINE HCL 25 MG/ML IJ SOLN: 6.2500 mg | INTRAMUSCULAR | Status: DC | PRN

## 2011-10-21 MED ORDER — LACTATED RINGERS IV SOLN
INTRAVENOUS | Status: DC
  Administered 2011-10-21 (×2): via INTRAVENOUS

## 2011-10-21 MED ORDER — NEOSTIGMINE METHYLSULFATE 1 MG/ML IJ SOLN
INTRAMUSCULAR | Status: AC
  Filled 2011-10-21: qty 10

## 2011-10-21 MED ORDER — LIDOCAINE HCL (CARDIAC) 20 MG/ML IV SOLN
INTRAVENOUS | Status: DC | PRN
  Administered 2011-10-21 (×2): 50 mg via INTRAVENOUS

## 2011-10-21 MED ORDER — LACTATED RINGERS IR SOLN
Status: DC | PRN
  Administered 2011-10-21: 3000 mL

## 2011-10-21 MED ORDER — OXYCODONE-ACETAMINOPHEN 5-325 MG PO TABS: 1.0000 | ORAL_TABLET | ORAL | Status: AC | PRN

## 2011-10-21 MED ORDER — NEOSTIGMINE METHYLSULFATE 1 MG/ML IJ SOLN
INTRAMUSCULAR | Status: DC | PRN
  Administered 2011-10-21: 2 mg via INTRAVENOUS
  Administered 2011-10-21: 3 mg via INTRAVENOUS

## 2011-10-21 MED ORDER — LIDOCAINE HCL (CARDIAC) 20 MG/ML IV SOLN
INTRAVENOUS | Status: AC
  Filled 2011-10-21: qty 5

## 2011-10-21 MED ORDER — GLYCOPYRROLATE 0.2 MG/ML IJ SOLN
INTRAMUSCULAR | Status: AC
  Filled 2011-10-21: qty 1

## 2011-10-21 MED ORDER — PROPOFOL 10 MG/ML IV EMUL
INTRAVENOUS | Status: DC | PRN
  Administered 2011-10-21: 150 mg via INTRAVENOUS
  Administered 2011-10-21: 50 mg via INTRAVENOUS

## 2011-10-21 MED ORDER — BUPIVACAINE HCL (PF) 0.25 % IJ SOLN
INTRAMUSCULAR | Status: AC
  Filled 2011-10-21: qty 30

## 2011-10-21 MED ORDER — FENTANYL CITRATE 0.05 MG/ML IJ SOLN
INTRAMUSCULAR | Status: DC | PRN
  Administered 2011-10-21: 150 ug via INTRAVENOUS
  Administered 2011-10-21: 100 ug via INTRAVENOUS

## 2011-10-21 MED ORDER — PANTOPRAZOLE SODIUM 40 MG PO TBEC
40.0000 mg | DELAYED_RELEASE_TABLET | Freq: Every day | ORAL | Status: DC
  Filled 2011-10-21 (×2): qty 1

## 2011-10-21 SURGICAL SUPPLY — 54 items
ADH SKN CLS APL DERMABOND .7 (GAUZE/BANDAGES/DRESSINGS) ×1
BAG URINE DRAINAGE (UROLOGICAL SUPPLIES) ×3 IMPLANT
BARRIER ADHS 3X4 INTERCEED (GAUZE/BANDAGES/DRESSINGS) ×3 IMPLANT
BRR ADH 4X3 ABS CNTRL BYND (GAUZE/BANDAGES/DRESSINGS) ×2
CABLE HIGH FREQUENCY MONO STRZ (ELECTRODE) ×3 IMPLANT
CATH FOLEY 3WAY  5CC 16FR (CATHETERS) ×1
CATH FOLEY 3WAY 5CC 16FR (CATHETERS) ×2 IMPLANT
CHLORAPREP W/TINT 26ML (MISCELLANEOUS) ×3 IMPLANT
CLOTH BEACON ORANGE TIMEOUT ST (SAFETY) ×3 IMPLANT
CONT PATH 16OZ SNAP LID 3702 (MISCELLANEOUS) ×3 IMPLANT
COVER MAYO STAND STRL (DRAPES) ×3 IMPLANT
COVER TABLE BACK 60X90 (DRAPES) ×6 IMPLANT
COVER TIP SHEARS 8 DVNC (MISCELLANEOUS) ×2 IMPLANT
COVER TIP SHEARS 8MM DA VINCI (MISCELLANEOUS) ×1
DERMABOND ADVANCED (GAUZE/BANDAGES/DRESSINGS) ×1
DERMABOND ADVANCED .7 DNX12 (GAUZE/BANDAGES/DRESSINGS) ×2 IMPLANT
DILATOR CANAL MILEX (MISCELLANEOUS) ×3 IMPLANT
DRAPE HUG U DISPOSABLE (DRAPE) ×3 IMPLANT
DRAPE LG THREE QUARTER DISP (DRAPES) ×6 IMPLANT
DRAPE WARM FLUID 44X44 (DRAPE) ×3 IMPLANT
ELECT REM PT RETURN 9FT ADLT (ELECTROSURGICAL) ×3
ELECTRODE REM PT RTRN 9FT ADLT (ELECTROSURGICAL) ×2 IMPLANT
EVACUATOR SMOKE 8.L (FILTER) ×3 IMPLANT
GAUZE VASELINE 3X9 (GAUZE/BANDAGES/DRESSINGS) IMPLANT
GLOVE BIO SURGEON STRL SZ 6.5 (GLOVE) ×12 IMPLANT
GLOVE BIOGEL PI IND STRL 7.0 (GLOVE) ×6 IMPLANT
GLOVE BIOGEL PI INDICATOR 7.0 (GLOVE) ×3
GOWN STRL REIN XL XLG (GOWN DISPOSABLE) ×18 IMPLANT
KIT ACCESSORY DA VINCI DISP (KITS) ×1
KIT ACCESSORY DVNC DISP (KITS) ×2 IMPLANT
NEEDLE INSUFFLATION 14GA 120MM (NEEDLE) ×3 IMPLANT
OCCLUDER COLPOPNEUMO (BALLOONS) ×3 IMPLANT
PACK LAVH (CUSTOM PROCEDURE TRAY) ×3 IMPLANT
PAD PREP 24X48 CUFFED NSTRL (MISCELLANEOUS) ×6 IMPLANT
PLUG CATH AND CAP STER (CATHETERS) ×3 IMPLANT
PROTECTOR NERVE ULNAR (MISCELLANEOUS) ×6 IMPLANT
SET IRRIG TUBING LAPAROSCOPIC (IRRIGATION / IRRIGATOR) ×3 IMPLANT
SOLUTION ELECTROLUBE (MISCELLANEOUS) ×3 IMPLANT
SUT VIC AB 0 CT1 27 (SUTURE) ×14
SUT VIC AB 0 CT1 27XBRD ANTBC (SUTURE) ×14 IMPLANT
SUT VICRYL 0 UR6 27IN ABS (SUTURE) ×6 IMPLANT
SUT VICRYL 4-0 PS2 18IN ABS (SUTURE) ×6 IMPLANT
SYR 50ML LL SCALE MARK (SYRINGE) ×3 IMPLANT
SYRINGE 10CC LL (SYRINGE) ×3 IMPLANT
TIP UTERINE 6.7X6CM WHT DISP (MISCELLANEOUS) ×3 IMPLANT
TIP UTERINE 6.7X8CM BLUE DISP (MISCELLANEOUS) ×3 IMPLANT
TOWEL OR 17X24 6PK STRL BLUE (TOWEL DISPOSABLE) ×9 IMPLANT
TROCAR DISP BLADELESS 8 DVNC (TROCAR) ×2 IMPLANT
TROCAR DISP BLADELESS 8MM (TROCAR) ×1
TROCAR XCEL NON-BLD 5MMX100MML (ENDOMECHANICALS) ×3 IMPLANT
TROCAR Z-THREAD 12X150 (TROCAR) ×3 IMPLANT
TUBING FILTER THERMOFLATOR (ELECTROSURGICAL) ×3 IMPLANT
WARMER LAPAROSCOPE (MISCELLANEOUS) ×3 IMPLANT
WATER STERILE IRR 1000ML POUR (IV SOLUTION) ×9 IMPLANT

## 2011-10-21 NOTE — Progress Notes (Signed)
Pt teaching complete  pts MD called to   Change po pain med   Md will call med to  [facility] in [place]    Pt voiding well  And eating  No nausea  Noted  Out in wheelchair

## 2011-10-21 NOTE — Transfer of Care (Signed)
Immediate Anesthesia Transfer of Care Note  Patient: Kelly Massey  Procedure(s) Performed: Procedure(s) (LRB): ROBOTIC ASSISTED TOTAL HYSTERECTOMY (N/A) BILATERAL SALPINGECTOMY (Bilateral)  Patient Location: PACU  Anesthesia Type: General  Level of Consciousness: sedated and patient cooperative  Airway & Oxygen Therapy: Patient Spontanous Breathing and Patient connected to nasal cannula oxygen  Post-op Assessment: Report given to PACU RN and Post -op Vital signs reviewed and stable  Post vital signs: Reviewed and stable  Complications: No apparent anesthesia complications

## 2011-10-21 NOTE — Anesthesia Preprocedure Evaluation (Signed)
Anesthesia Evaluation  Patient identified by MRN, date of birth, ID band Patient awake    Reviewed: Allergy & Precautions, H&P , NPO status , Patient's Chart, lab work & pertinent test results  Airway Mallampati: II TM Distance: >3 FB Neck ROM: full    Dental No notable dental hx.    Pulmonary neg pulmonary ROS,  breath sounds clear to auscultation  Pulmonary exam normal       Cardiovascular negative cardio ROS  Rhythm:regular Rate:Normal     Neuro/Psych negative neurological ROS  negative psych ROS   GI/Hepatic Neg liver ROS, GERD-  Medicated and Controlled,  Endo/Other  negative endocrine ROS  Renal/GU negative Renal ROS  negative genitourinary   Musculoskeletal negative musculoskeletal ROS (+)   Abdominal Normal abdominal exam  (+)   Peds  Hematology negative hematology ROS (+)   Anesthesia Other Findings   Reproductive/Obstetrics negative OB ROS                           Anesthesia Physical Anesthesia Plan  ASA: II  Anesthesia Plan: General   Post-op Pain Management:    Induction: Intravenous  Airway Management Planned: Oral ETT  Additional Equipment:   Intra-op Plan:   Post-operative Plan: Extubation in OR  Informed Consent: I have reviewed the patients History and Physical, chart, labs and discussed the procedure including the risks, benefits and alternatives for the proposed anesthesia with the patient or authorized representative who has indicated his/her understanding and acceptance.   Dental advisory given  Plan Discussed with: CRNA and Surgeon  Anesthesia Plan Comments:         Anesthesia Quick Evaluation

## 2011-10-21 NOTE — Brief Op Note (Signed)
10/21/2011  11:07 AM  PATIENT:  Greggory Keen T[place]te  46 y.o. female  PRE-OPERATIVE DIAGNOSIS:  Dysmenorrhea, Menorrhagia, Fibroids  POST-OPERATIVE DIAGNOSIS:  Dysmenorrhea, Menorrhagia, Fibroids, Stage 1 pelvic endometriosis  PROCEDURE:  Procedure(s) (LRB):DAVINCI ROBOTIC ASSISTED TOTAL HYSTERECTOMY (N/A) BILATERAL SALPINGECTOMY (Bilateral) , RESECTION OF PELVIC ENDOMETRIOSIS  SURGEON:  Surgeon(s) and Role:    * Maigan Bittinger Cathie Beams, MD - Primary  PHYSICIAN ASSISTANT:   ASSISTANTS: Viviann Spare, MD   ANESTHESIA:   general  EBL:  Total I/O In: 1200 [I.V.:1200] Out: 350 [Urine:300; Blood:50]  BLOOD ADMINISTERED:none FINDINGS: PELVIC ENDOMETRIOSIS( POST CUL DE SAC AND LEFT WALL. FIBROID UTERUS, NL APPENDIX, NL OVARIES, LEFT TUBE ADHERENT TO BOWEL, NL RIGHT TUBE, NL LIVER EDGE DRAINS: none   LOCAL MEDICATIONS USED:  MARCAINE     SPECIMEN:  Source of Specimen:  uterus w/ cervix, bilateral fallopian tubes, endometriotic implants  DISPOSITION OF SPECIMEN:  PATHOLOGY  COUNTS:  YES  TOURNIQUET:  * No tourniquets in log *  DICTATION: .Other Dictation: Dictation Number [id number]  PLAN OF CARE: placed in observation  PATIENT DISPOSITION:  PACU - hemodynamically stable.   Delay start of Pharmacological VTE agent (>24hrs) due to surgical blood loss or risk of bleeding: no

## 2011-10-21 NOTE — Anesthesia Postprocedure Evaluation (Signed)
  Anesthesia Post-op Note  Patient: Kelly Massey  Procedure(s) Performed: Procedure(s) (LRB): ROBOTIC ASSISTED TOTAL HYSTERECTOMY (N/A) BILATERAL SALPINGECTOMY (Bilateral)  Patient Location: [facility]  Anesthesia Type: General  Level of Consciousness: awake, alert  and patient cooperative  Airway and Oxygen Therapy: Patient Spontanous Breathing  Post-op Pain: none  Post-op Assessment: Patient's Cardiovascular Status Stable, Respiratory Function Stable, Patent Airway, No signs of Nausea or vomiting, Adequate PO intake and Pain level controlled  Post-op Vital Signs: Reviewed and stable  Complications: No apparent anesthesia complications

## 2011-10-21 NOTE — Anesthesia Procedure Notes (Signed)
Procedure Name: Intubation Date/Time: 10/21/2011 7:34 AM Performed by: Isabella Bowens R Pre-anesthesia Checklist: Patient identified, Emergency Drugs available, Suction available, Patient being monitored and Timeout performed Patient Re-evaluated:Patient Re-evaluated prior to inductionOxygen Delivery Method: Circle system utilized Preoxygenation: Pre-oxygenation with 100% oxygen Intubation Type: IV induction Ventilation: Mask ventilation without difficulty Laryngoscope Size: Mac and 3 Grade View: Grade II Tube type: Oral Tube size: 7.0 mm Number of attempts: 2 Airway Equipment and Method: Stylet Placement Confirmation: ETT inserted through vocal cords under direct vision,  positive ETCO2 and breath sounds checked- equal and bilateral Secured at: 20 cm Tube secured with: Tape Dental Injury: Teeth and Oropharynx as per pre-operative assessment  Difficulty Due To: Difficulty was unanticipated

## 2011-10-22 NOTE — Op Note (Signed)
NAME:  Kelly Massey, Kelly Massey NO.:  [id number]  MEDICAL RECORD NO.:  [id number]  LOCATION:  9317                          FACILITY:  [facility]  PHYSICIAN:  Maxie Better, M.D.DATE OF BIRTH:  19-Sep-1965  DATE OF PROCEDURE:  10/21/2011 DATE OF DISCHARGE:  10/21/2011                              OPERATIVE REPORT   PREOPERATIVE DIAGNOSES:  Menorrhagia, dysmenorrhea, and uterine fibroids.  PROCEDURE:  Da Vinci robotic total hysterectomy, bilateral salpingectomy, resection of pelvic endometriosis.  POSTOPERATIVE DIAGNOSES:  Menorrhagia, uterine fibroids, dysmenorrhea, stage I pelvic endometriosis.  ANESTHESIA:  General.  SURGEON:  Maxie Better, MD  ASSISTANTS:  Lendon Colonel, MD.  PROCEDURE:  Under adequate general anesthesia, the patient was placed in the dorsal lithotomy position.  She was positioned for robotic surgery. Examination under anesthesia revealed an irregular retroflexed uterus about 10-week size.  No adnexal masses could be appreciated.  The patient was sterilely prepped and draped in the usual fashion and a three-way Foley was sterilely placed.  Sims retractor was used in the vagina and a weighted speculum was used.  The cervix was grasped with a single-toothed tenaculum.  A 0 Vicryl figure-of-eight sutures was placed on the anterior and posterior lip of the cervix.  The cervical os was serially dilated up to a #25 Pratt dilator.  The uterus sounded to 8 cm. The small size RUMI cup along with a #8 uterine manipulator was introduced into the uterine cavity without incident.  The retractors were removed and attention was then turned to the abdomen.  A 0.25% Marcaine was injected supraumbilically.  Supraumbilical vertical incision was made.  Veress needle was introduced.  Opening pressure 5 was noted.  A 2.5 L of CO2 was insufflated.  The Veress needle was removed.  The Veress needle had been tested prior to insufflation.  The 12-mm disposable  trocar was introduced.  However, the placement appeared to be above the peritoneum. The trocar was removed.  Some loss of gas was noted and the Veress needle was introduced again.  Additional carbon dioxide was insufflated and the 12-mm trocar with sleeve was introduced into the abdomen without incident.  The robotic camera port was then placed.  The patient was placed in deep Trendelenburg position.  The upper abdomen was inspected.  Normal liver edge was noted.  The uterus was noted to have multiple fibroids.  Once the patient was placed in deep Trendelenburg, two 8-mm ports were placed, a handsbreadth away from each other on the left.  An 8-mm robotic port was placed on the right and a 5 mm assistant port was placed in the right lower quadrant.  Under direct visualization, the robotic ports sites were placed.  The robot was then docked to the ports.  The pelvis had been inspected. Endometriotic implants were noticed in the posterior cul-de-sac.  The right tube and ovaries was noted to be normal.  The left tube was not initially seen and there was adhesions of the bowel to the left adnexal area.  Once the robot was docked, I went to the surgical console.  At the surgical console, the pelvis was inspected and there was then noted that the left fallopian tube was  adherent to the bowel and was looped underneath the left ovary.  Adhesions of the bowel to that left adnexa was noted.  The posterior cul-de-sac was noted to be several endometriotic implants.  The bladder was otherwise normal.  The ureter was noted to be peristalsing on the right.  The procedure was started on the right side with the instruments that had been placed for the robot included the monopolar scissors in arm 1, the PK dissector in the arm 2 and the probe grasper in the arm 3.  Using the probe grasp, the fallopian tube was held.  The mesosalpinx was serially clamped, cauterized, and then cut followed by the round  ligament on the right, which being clamped, cauterized, and cut.  The right utero-ovarian ligament was subsequently clamped, cauterized, and cut.  The anterior- posterior leaf of the broad ligament was then opened and the vesicouterine peritoneum was opened transversely.  The bladder was then bluntly and sharply dissected off the lower uterine segment.  The uterine vessels on the right was noted to be tortuous, but they were isolated, subsequently cauterized but not cut.  Attention was then turned to the opposite side.  Given the look of the fallopian tube being adherent to the bowels, care was taken to start the adhesions removal off the bowels with non-cauterized dissection.  This was carefully performed throughout, which subsequently released of the fallopian tube off of the bowels and the left mesosalpinx being serially clamped, cauterized, and then cut, carried to the utero-ovarian ligament on the left as well as the round ligament to be clamped, cauterized, and cut. The anterior and posterior leaf of the broad ligament at that point was also opened.  The bladder dissection was then carried down anteriorly. The uterine vessels were skeletonized and serially clamped, cauterized, and subsequently cut.  The attention was then turned back to the right side where the uterine vessels were then re-cauterized and cut.  Once the uterus was noted to be blanching and dusky, the bladder being displaced inferiorly, the cervical vaginal junction was then cut up just above the RUMI cup circumferentially.  The fibroids were opened, but not removed from the uterus, and the uterus with cervix and tubes were then removed through the vagina.  The bladder was then displaced further inferiorly.  The monopolar scissors and the PK dissector was then replaced with a long tip forceps and a large needle driver.  The vaginal cuff with small cauterization of bleeders on that site was then subsequently closed  with 0 Vicryl figure-of-eight sutures.  The ureter on the left was ultimately seen.  The endometriotic implants were then dissected off in the 2 locations posteriorly.  The abdomen was then copiously irrigated, suctioned.  The appendix was noted to be normal. At that point, the robot was undocked and #8 robotic camera port was placed in one of the robotic port sites and the sutures of 0 Vicryl and the needles were all removed.  The abdomen was then irrigated and suctioned.  Good hemostasis was subsequently noted.  The port sites were removed, abdomen was deflated.  The vaginal cuff had been inspected during the case and postsurgery was digitally examined.  The incisions were closed with fascial stitch at the supraumbilical site of 0 Vicryl figure-of-eight and 4-0 Vicryl subcuticular stitches for the remaining surfaces and Dermabond was placed over the area.  SPECIMENS:  Uterus with cervix and fallopian tubes as well as resection of the peritoneum and pelvic endometriosis, all sent to pathology.  ESTIMATED BLOOD LOSS:  50 mL.  INTRAOPERATIVE FLUIDS:  900 mL.  URINE OUTPUT:  300 mL clear yellow urine.  Sponge and instrument counts x2 was correct.  COMPLICATIONS:  None.  The patient tolerated the procedure well, was transferred to recovery in stable condition.    Maxie Better, M.D.    [facility]  D:  10/21/2011  T:  10/22/2011  Job:  [id number]

## 2011-11-01 ENCOUNTER — Other Ambulatory Visit: Payer: Self-pay | Admitting: Obstetrics and Gynecology

## 2011-11-01 DIAGNOSIS — Z1231 Encounter for screening mammogram for malignant neoplasm of breast: Secondary | ICD-10-CM

## 2011-11-02 NOTE — Progress Notes (Signed)
Post discharge UR review completed. 

## 2011-11-16 ENCOUNTER — Ambulatory Visit
Admission: RE | Admit: 2011-11-16 | Discharge: 2011-11-16 | Disposition: A | Discharge status: Home / Self Care | Payer: 59 | Source: Ambulatory Visit | Attending: Obstetrics and Gynecology | Admitting: Obstetrics and Gynecology

## 2011-11-16 DIAGNOSIS — Z1231 Encounter for screening mammogram for malignant neoplasm of breast: Secondary | ICD-10-CM

## 2012-01-24 ENCOUNTER — Telehealth: Payer: Self-pay | Admitting: Internal Medicine

## 2012-01-24 NOTE — Telephone Encounter (Signed)
Pt called wanted to know if she could be see sooner than jan She is having pain on left side. Pt has hysterometry in July Pt stated it started last Wednesday

## 2012-01-25 NOTE — Telephone Encounter (Signed)
Pt aware of appointment 

## 2012-01-25 NOTE — Telephone Encounter (Signed)
I can see her on 01/27/12 at 11:30.  Work in for this problem.  Let me know if problems.

## 2012-01-27 ENCOUNTER — Encounter: Payer: Self-pay | Admitting: Internal Medicine

## 2012-01-27 ENCOUNTER — Ambulatory Visit: Payer: 59 | Admitting: Internal Medicine

## 2012-02-08 ENCOUNTER — Encounter: Payer: Self-pay | Admitting: Internal Medicine

## 2012-02-08 ENCOUNTER — Ambulatory Visit (INDEPENDENT_AMBULATORY_CARE_PROVIDER_SITE_OTHER): Payer: 59 | Admitting: Internal Medicine

## 2012-02-08 VITALS — BP 126/72 | HR 72 | Temp 98.7°F | Ht 66.0 in | Wt 183.0 lb

## 2012-02-08 DIAGNOSIS — D72819 Decreased white blood cell count, unspecified: Secondary | ICD-10-CM

## 2012-02-08 DIAGNOSIS — E7800 Pure hypercholesterolemia, unspecified: Secondary | ICD-10-CM

## 2012-02-08 DIAGNOSIS — R5381 Other malaise: Secondary | ICD-10-CM

## 2012-02-08 DIAGNOSIS — R5383 Other fatigue: Secondary | ICD-10-CM

## 2012-02-08 DIAGNOSIS — Z9109 Other allergy status, other than to drugs and biological substances: Secondary | ICD-10-CM

## 2012-02-08 DIAGNOSIS — E049 Nontoxic goiter, unspecified: Secondary | ICD-10-CM

## 2012-02-08 DIAGNOSIS — K219 Gastro-esophageal reflux disease without esophagitis: Secondary | ICD-10-CM | POA: Insufficient documentation

## 2012-02-08 NOTE — Patient Instructions (Signed)
It was good seeing you today.  I am glad your surgery went well.  We will get your labs scheduled.  Let me know if you have any problems.

## 2012-02-13 ENCOUNTER — Encounter: Payer: Self-pay | Admitting: Internal Medicine

## 2012-02-13 DIAGNOSIS — Z9109 Other allergy status, other than to drugs and biological substances: Secondary | ICD-10-CM | POA: Insufficient documentation

## 2012-02-13 DIAGNOSIS — E049 Nontoxic goiter, unspecified: Secondary | ICD-10-CM | POA: Insufficient documentation

## 2012-02-13 NOTE — Assessment & Plan Note (Signed)
Did report some fatigue, but overall doing well.  Check cbc, met c and tsh.

## 2012-02-13 NOTE — Assessment & Plan Note (Signed)
Previous ultrasound revealed no nodules.  Check TSH.

## 2012-02-13 NOTE — Assessment & Plan Note (Signed)
Low cholesterol diet and exercise.  Follow.  Check lipid panel.   

## 2012-02-13 NOTE — Assessment & Plan Note (Signed)
Receiving injections now.  Follow.  

## 2012-02-13 NOTE — Assessment & Plan Note (Signed)
Has known Barretts.  EGD 11/12/10 - bx c/w reflux gastroesophagitis.  Continue Dexilant.  Doing better.  Follow.   

## 2012-02-13 NOTE — Progress Notes (Signed)
  Subjective:    Patient ID: Kelly Massey, female    DOB: May 04, 1965, 46 y.o.   MRN: [id number]  HPI 46 year old female with past history of hypercholesterolemia and GERD (with Barretts) who comes in today for a scheduled follow up.  She just recently underwent hysterectomy and what sounds like lysis of adhesions.  Ovaries not removed.  Sees Dr Cherly Hensen.  Is doing well.  Feels good.  Was previously having some left side pain.  Lasted about 2 weeks.  (left lateral side).  Resolved.  Not an issue for her now.   Had some previous minimal constipation, but this has resolved.  Overall she feels he is doing well.    Past Medical History  Diagnosis Date  . Arthritis   . Hypertension   . Hyperlipidemia   . Colon polyps   . HSV infection     History  . GERD (gastroesophageal reflux disease) 10/12/10    EGD, positive H. pylori  . Barrett's esophagus   . Post-operative nausea and vomiting     Review of Systems Patient denies any headache, lightheadedness or dizziness.  No significant allergy or sinus symptoms.   No chest pain, tightness or palpitations.  No increased shortness of breath, cough or congestion.  Acid reflux - controlled.  No nausea or vomiting.  No abdominal pain or cramping.  No bowel change, such as diarrhea, constipation, BRBPR or melana.  No urine change.        Objective:   Physical Exam Filed Vitals:   02/08/12 1603  BP: 126/72  Pulse: 72  Temp: 98.7 F (31.57 C)   45 year old female in no acute distress.   HEENT:  Nares - clear.  OP- without lesions or erythema.  NECK:  Supple, nontender.  No audible bruit.   HEART:  Appears to be regular. LUNGS:  Without crackles or wheezing audible.  Respirations even and unlabored.   RADIAL PULSE:  Equal bilaterally.  ABDOMEN:  Soft, nontender.  No audible abdominal bruit.   EXTREMITIES:  No increased edema to be present.                   Assessment & Plan:  GYN.  S/P hysterectomy and doing well.  Follow.    CARDIOVASCULAR.   Currently asymptomatic.    ELEVATED BLOOD PRESSURE.  Blood pressure doing well on no meds.  Follow.    PREVIOUS ABNORMAL MAMMOGRAM.  Mammogram 10/12/10 rec follow up views.  These were performed 10/15/10 - revealed what was read as a benign lymph node - Birads II.  Saw Dr Lemar Livings.  Getting regular mammograms in [place].  Mammogram 11/16/11 - BiRADS I.  Recommended follow up mammogram in one year.   HEALTH MAINTENANCE.  Physical 09/09/11.  Pelvics through GYN (Dr Cherly Hensen).  Colonoscopy 10/12/10 with polyps removed.  Continues to follow with GI.

## 2012-02-13 NOTE — Assessment & Plan Note (Signed)
Has a history of leukopenia and neutrapenia.  HIV and ANA checked in the past and negative.  TSH, B12, folate and SIEP all negative.  She was taken off Zegrid and Aciphex secondary to the leukopenia.  On Dexilant.  Recheck cbc.

## 2012-02-24 ENCOUNTER — Telehealth: Payer: Self-pay | Admitting: Internal Medicine

## 2012-02-24 NOTE — Telephone Encounter (Signed)
Pt called to let you know that noone has called her from GI dr

## 2012-02-28 ENCOUNTER — Other Ambulatory Visit: Payer: Self-pay | Admitting: Internal Medicine

## 2012-02-28 ENCOUNTER — Other Ambulatory Visit (INDEPENDENT_AMBULATORY_CARE_PROVIDER_SITE_OTHER): Payer: 59

## 2012-02-28 DIAGNOSIS — R5381 Other malaise: Secondary | ICD-10-CM

## 2012-02-28 DIAGNOSIS — D72819 Decreased white blood cell count, unspecified: Secondary | ICD-10-CM

## 2012-02-28 DIAGNOSIS — E7800 Pure hypercholesterolemia, unspecified: Secondary | ICD-10-CM

## 2012-02-28 DIAGNOSIS — R5383 Other fatigue: Secondary | ICD-10-CM

## 2012-02-28 LAB — COMPREHENSIVE METABOLIC PANEL
ALT: 20 U/L (ref 0–35)
AST: 19 U/L (ref 0–37)
Albumin: 4 g/dL (ref 3.5–5.2)
Alkaline Phosphatase: 47 U/L (ref 39–117)
BUN: 8 mg/dL (ref 6–23)
CO2: 29 mEq/L (ref 19–32)
Calcium: 9.1 mg/dL (ref 8.4–10.5)
Chloride: 102 mEq/L (ref 96–112)
Creatinine, Ser: 0.7 mg/dL (ref 0.4–1.2)
GFR: 117.44 mL/min (ref >60.00)
Glucose, Bld: 88 mg/dL (ref 70–99)
Potassium: 4 mEq/L (ref 3.5–5.1)
Sodium: 137 mEq/L (ref 135–145)
Total Bilirubin: 0.9 mg/dL (ref 0.3–1.2)
Total Protein: 7.3 g/dL (ref 6.0–8.3)

## 2012-02-28 LAB — CBC WITH DIFFERENTIAL/PLATELET
Basophils Absolute: 0 10*3/uL (ref 0.0–0.1)
Basophils Relative: 0.8 % (ref 0.0–3.0)
Eosinophils Absolute: 0 10*3/uL (ref 0.0–0.7)
Eosinophils Relative: 0.6 % (ref 0.0–5.0)
HCT: 42.5 % (ref 36.0–46.0)
Hemoglobin: 14 g/dL (ref 12.0–15.0)
Lymphocytes Relative: 45.6 % (ref 12.0–46.0)
Lymphs Abs: 1.5 10*3/uL (ref 0.7–4.0)
MCHC: 32.9 g/dL (ref 30.0–36.0)
MCV: 93.2 fl (ref 78.0–100.0)
Monocytes Absolute: 0.3 10*3/uL (ref 0.1–1.0)
Monocytes Relative: 8.5 % (ref 3.0–12.0)
Neutro Abs: 1.5 10*3/uL (ref 1.4–7.7)
Neutrophils Relative %: 44.5 % (ref 43.0–77.0)
Platelets: 177 10*3/uL (ref 150.0–400.0)
RBC: 4.56 Mil/uL (ref 3.87–5.11)
RDW: 14.1 % (ref 11.5–14.6)
WBC: 3.4 10*3/uL — ABNORMAL LOW (ref 4.5–10.5)

## 2012-02-28 LAB — LIPID PANEL
Cholesterol: 192 mg/dL (ref 0–200)
HDL: 80.4 mg/dL (ref >39.00)
LDL Cholesterol: 102 mg/dL — ABNORMAL HIGH (ref 0–99)
Total CHOL/HDL Ratio: 2
Triglycerides: 49 mg/dL (ref 0.0–149.0)
VLDL: 9.8 mg/dL (ref 0.0–40.0)

## 2012-02-28 LAB — TSH: TSH: 0.64 u[IU]/mL (ref 0.35–5.50)

## 2012-02-28 NOTE — Progress Notes (Signed)
Ordered a follow up cbc.

## 2012-02-29 ENCOUNTER — Other Ambulatory Visit: Payer: 59

## 2012-03-20 ENCOUNTER — Other Ambulatory Visit: Payer: 59

## 2012-03-20 ENCOUNTER — Other Ambulatory Visit: Payer: Self-pay | Admitting: Internal Medicine

## 2012-03-20 ENCOUNTER — Other Ambulatory Visit (INDEPENDENT_AMBULATORY_CARE_PROVIDER_SITE_OTHER): Payer: 59

## 2012-03-20 DIAGNOSIS — D72819 Decreased white blood cell count, unspecified: Secondary | ICD-10-CM

## 2012-03-20 LAB — CBC WITH DIFFERENTIAL/PLATELET
Basophils Absolute: 0 10*3/uL (ref 0.0–0.1)
Basophils Relative: 0.9 % (ref 0.0–3.0)
Eosinophils Absolute: 0 10*3/uL (ref 0.0–0.7)
Eosinophils Relative: 1.1 % (ref 0.0–5.0)
HCT: 40.2 % (ref 36.0–46.0)
Hemoglobin: 13.6 g/dL (ref 12.0–15.0)
Lymphocytes Relative: 42.4 % (ref 12.0–46.0)
Lymphs Abs: 1.5 10*3/uL (ref 0.7–4.0)
MCHC: 33.7 g/dL (ref 30.0–36.0)
MCV: 91.8 fl (ref 78.0–100.0)
Monocytes Absolute: 0.3 10*3/uL (ref 0.1–1.0)
Monocytes Relative: 9.4 % (ref 3.0–12.0)
Neutro Abs: 1.6 10*3/uL (ref 1.4–7.7)
Neutrophils Relative %: 46.2 % (ref 43.0–77.0)
Platelets: 221 10*3/uL (ref 150.0–400.0)
RBC: 4.38 Mil/uL (ref 3.87–5.11)
RDW: 13.7 % (ref 11.5–14.6)
WBC: 3.6 10*3/uL — ABNORMAL LOW (ref 4.5–10.5)

## 2012-05-08 ENCOUNTER — Telehealth: Payer: Self-pay | Admitting: Internal Medicine

## 2012-05-08 DIAGNOSIS — K2270 Barrett's esophagus without dysplasia: Secondary | ICD-10-CM

## 2012-05-08 NOTE — Telephone Encounter (Signed)
Pt called and was wanting to know if she could get a referral to [facility]. She stated that she dicussed this with you at the last visit but decided not to go until 2014 but I did not see an order in her chart. She says she would like to stay within [place].

## 2012-05-10 ENCOUNTER — Encounter: Payer: Self-pay | Admitting: Gastroenterology

## 2012-05-10 NOTE — Telephone Encounter (Signed)
Order placed for referral to GI (Luke)

## 2012-05-10 NOTE — Telephone Encounter (Signed)
Notify pt I have placed order for GI referral.  Amber should be calling her with an appt.

## 2012-05-10 NOTE — Telephone Encounter (Signed)
Left message with husband.

## 2012-05-16 ENCOUNTER — Telehealth: Payer: Self-pay | Admitting: General Practice

## 2012-05-16 MED ORDER — MOMETASONE FUROATE 50 MCG/ACT NA SUSP: 2.0000 | Freq: Every day | NASAL | Status: DC

## 2012-05-16 NOTE — Telephone Encounter (Signed)
Notify pt I sent in rx for nasonex.

## 2012-05-16 NOTE — Telephone Encounter (Signed)
Pt called wanting to know if Nasonex could be called into [facility] in [place].

## 2012-05-16 NOTE — Telephone Encounter (Signed)
rx sent in for nasonex.

## 2012-05-17 ENCOUNTER — Ambulatory Visit: Payer: 59 | Admitting: Gastroenterology

## 2012-05-17 NOTE — Telephone Encounter (Signed)
Patient notified

## 2012-06-12 ENCOUNTER — Encounter: Payer: Self-pay | Admitting: Gastroenterology

## 2012-06-12 ENCOUNTER — Ambulatory Visit (INDEPENDENT_AMBULATORY_CARE_PROVIDER_SITE_OTHER): Payer: 59 | Admitting: Gastroenterology

## 2012-06-12 VITALS — BP 120/72 | HR 84 | Ht 66.0 in | Wt 183.6 lb

## 2012-06-12 DIAGNOSIS — R1310 Dysphagia, unspecified: Secondary | ICD-10-CM | POA: Insufficient documentation

## 2012-06-12 DIAGNOSIS — K219 Gastro-esophageal reflux disease without esophagitis: Secondary | ICD-10-CM

## 2012-06-12 DIAGNOSIS — K2270 Barrett's esophagus without dysplasia: Secondary | ICD-10-CM

## 2012-06-12 NOTE — Assessment & Plan Note (Addendum)
While pyrosis is moderately well-controlled, symptoms of hoarseness and sore throat could be a manifestation of ongoing acid reflux. Note patient has had leukopenia associated with acid blocker therapy  Recommendations #1 continue current medications #248 hour bravo pH probe study while patient is taking medications

## 2012-06-12 NOTE — Patient Instructions (Addendum)
Your Endoscopy has been scheduled on 06/20/2012 At [facility] Separate instructions have been given

## 2012-06-12 NOTE — Assessment & Plan Note (Signed)
Suspect peptic stricture  Recommendations #1 upper endoscopy with dilation as indicated 

## 2012-06-12 NOTE — Assessment & Plan Note (Signed)
History of Barrett's esophagus. Apparently endoscopy from 2-3 years ago did not demonstrate this.  Recommendations #1 upper endoscopy

## 2012-06-12 NOTE — Progress Notes (Signed)
History of Present Illness: Pleasant 47 year old African female referred at the request of Dr. Lorin Picket for evaluation of reflux and dysphagia. She's had reflux for years for which she is taking various PPIs and H2 receptor antagonists. She apparently has developed leukopenia felt secondary to her medications. She currently is on a regimen of dexilant in the mornings and Zantac each bedtime. She has intermittent break through pyrosis but not at any particular time of day. She complains of dysphagia to solids. Sore throat and hoarseness are frequent. She is not coughing.  Patient also has a history of Barrett's esophagus. Last examined 2-3 years ago apparently did not demonstrate Barrett's. She had a colonoscopy within the last 2 years that apparently was normal.    Past Medical History  Diagnosis Date  . Arthritis   . Hypertension     ?  Marland Kitchen Hyperlipidemia     ?  Marland Kitchen Colon polyps   . HSV infection     History  . GERD (gastroesophageal reflux disease) 10/12/10    EGD, positive H. pylori  . Barrett's esophagus   . Post-operative nausea and vomiting   . Anemia    Past Surgical History  Procedure Laterality Date  . Shoulder arthroscopy Left   . Dilation and curettage of uterus      SAB  . Abdominal hysterectomy    . Ovarian cyst removal     family history includes Arthritis in her mother; Breast cancer in her maternal aunt; Dementia in her mother; Diabetes in her brother, maternal uncle, paternal grandfather, paternal grandmother, and sister; Heart failure in her mother; Hyperlipidemia in her other; Hypertension in her mother and other; Lung cancer in her father; Prostate cancer in her maternal uncle; Stroke in her mother; and Throat cancer in her maternal aunt.  There is no history of Colon cancer. Current Outpatient Prescriptions  Medication Sig Dispense Refill  . acyclovir (ZOVIRAX) 400 MG tablet Take 400 mg by mouth 2 (two) times daily as needed. For outbreaks      . BIOTIN PO Take 1 tablet  by mouth daily.      . Calcium Carbonate-Vitamin D (CALCIUM 600+D3) 600-400 MG-UNIT per tablet Take 1 tablet by mouth daily.      . Cholecalciferol (VITAMIN D PO) Take 2 tablets by mouth daily. Gummy formulation      . dexlansoprazole (DEXILANT) 60 MG capsule Take 60 mg by mouth daily.      . diphenhydrAMINE (BENADRYL) 25 mg capsule Take 25 mg by mouth at bedtime as needed. For allergies      . ferrous sulfate 325 (65 FE) MG tablet Take 325 mg by mouth 2 (two) times daily.      . mometasone (NASONEX) 50 MCG/ACT nasal spray Place 2 sprays into the nose daily.  17 g  1  . ranitidine (ZANTAC) 150 MG tablet Take 300 mg by mouth daily.       No current facility-administered medications for this visit.   Allergies as of 06/12/2012 - Review Complete 06/12/2012  Allergen Reaction Noted  . Adhesive (tape) Other (See Comments) 10/15/2011  . Aspirin Nausea And Vomiting 02/08/2012  . Ibuprofen Nausea And Vomiting 02/08/2012  . Penicillins Diarrhea 02/08/2012  . Nsaids Other (See Comments) 10/04/2011  . Oxycodone Other (See Comments) 02/08/2012  . Shrimp (shellfish allergy) Nausea And Vomiting 10/21/2011    reports that she has never smoked. She has never used smokeless tobacco. She reports that she does not drink alcohol or use illicit drugs.  Review of Systems: Pertinent positive and negative review of systems were noted in the above HPI section. All other review of systems were otherwise negative.  Vital signs were reviewed in today's medical record Physical Exam: General: Well developed , well nourished, no acute distress Skin: anicteric Head: Normocephalic and atraumatic Eyes:  sclerae anicteric, EOMI Ears: Normal auditory acuity Mouth: No deformity or lesions Neck: Supple, no masses or thyromegaly Lungs: Clear throughout to auscultation Heart: Regular rate and rhythm; no murmurs, rubs or bruits Abdomen: Soft, non tender and non distended. No masses, hepatosplenomegaly or hernias  noted. Normal Bowel sounds Rectal:deferred Musculoskeletal: Symmetrical with no gross deformities  Skin: No lesions on visible extremities Pulses:  Normal pulses noted Extremities: No clubbing, cyanosis, edema or deformities noted Neurological: Alert oriented x 4, grossly nonfocal Cervical Nodes:  No significant cervical adenopathy Inguinal Nodes: No significant inguinal adenopathy Psychological:  Alert and cooperative. Normal mood and affect

## 2012-06-13 ENCOUNTER — Encounter: Payer: Self-pay | Admitting: Gastroenterology

## 2012-06-26 ENCOUNTER — Encounter (HOSPITAL_COMMUNITY): Admission: RE | Payer: Self-pay | Source: Ambulatory Visit

## 2012-06-26 ENCOUNTER — Telehealth: Payer: Self-pay | Admitting: Gastroenterology

## 2012-06-26 ENCOUNTER — Ambulatory Visit (HOSPITAL_COMMUNITY): Admission: RE | Admit: 2012-06-26 | Payer: 59 | Source: Ambulatory Visit | Admitting: Gastroenterology

## 2012-06-26 ENCOUNTER — Other Ambulatory Visit: Payer: 59

## 2012-06-26 ENCOUNTER — Telehealth: Payer: Self-pay | Admitting: Internal Medicine

## 2012-06-26 SURGERY — EGD (ESOPHAGOGASTRODUODENOSCOPY)
Anesthesia: Moderate Sedation

## 2012-06-26 NOTE — Telephone Encounter (Signed)
Will need to keep appt for evaluation for possible infection

## 2012-06-26 NOTE — Telephone Encounter (Signed)
Did schedule pt appt for 3/25 at 11am.  This can be cancelled if pt can be called in the ZPak.

## 2012-06-26 NOTE — Telephone Encounter (Signed)
Just an FYI

## 2012-06-26 NOTE — Telephone Encounter (Signed)
Pt asking for a ZPak for sinus infection.  Pt was scheduled to have an endoscopy but was unable due to sinus infection so that will have to be r/s.  Pt asking for the ZPak to get this cleared up ASAP for the endoscopy.  Please advise if this is possible or if pt has to come in to be seen.

## 2012-06-27 ENCOUNTER — Ambulatory Visit (INDEPENDENT_AMBULATORY_CARE_PROVIDER_SITE_OTHER): Payer: 59 | Admitting: Internal Medicine

## 2012-06-27 ENCOUNTER — Other Ambulatory Visit (INDEPENDENT_AMBULATORY_CARE_PROVIDER_SITE_OTHER): Payer: 59

## 2012-06-27 ENCOUNTER — Encounter: Payer: Self-pay | Admitting: Internal Medicine

## 2012-06-27 VITALS — BP 130/80 | HR 89 | Temp 98.0°F | Ht 66.0 in | Wt 184.5 lb

## 2012-06-27 DIAGNOSIS — K219 Gastro-esophageal reflux disease without esophagitis: Secondary | ICD-10-CM

## 2012-06-27 DIAGNOSIS — K2270 Barrett's esophagus without dysplasia: Secondary | ICD-10-CM

## 2012-06-27 DIAGNOSIS — D72819 Decreased white blood cell count, unspecified: Secondary | ICD-10-CM

## 2012-06-27 LAB — CBC WITH DIFFERENTIAL/PLATELET
Basophils Absolute: 0 10*3/uL (ref 0.0–0.1)
Basophils Relative: 1.2 % (ref 0.0–3.0)
Eosinophils Absolute: 0 10*3/uL (ref 0.0–0.7)
Eosinophils Relative: 1.4 % (ref 0.0–5.0)
HCT: 40 % (ref 36.0–46.0)
Hemoglobin: 13.4 g/dL (ref 12.0–15.0)
Lymphocytes Relative: 43.9 % (ref 12.0–46.0)
Lymphs Abs: 1.3 10*3/uL (ref 0.7–4.0)
MCHC: 33.6 g/dL (ref 30.0–36.0)
MCV: 92.3 fl (ref 78.0–100.0)
Monocytes Absolute: 0.4 10*3/uL (ref 0.1–1.0)
Monocytes Relative: 14.1 % — ABNORMAL HIGH (ref 3.0–12.0)
Neutro Abs: 1.1 10*3/uL — ABNORMAL LOW (ref 1.4–7.7)
Neutrophils Relative %: 39.4 % — ABNORMAL LOW (ref 43.0–77.0)
Platelets: 211 10*3/uL (ref 150.0–400.0)
RBC: 4.33 Mil/uL (ref 3.87–5.11)
RDW: 14.8 % — ABNORMAL HIGH (ref 11.5–14.6)
WBC: 2.9 10*3/uL — ABNORMAL LOW (ref 4.5–10.5)

## 2012-06-27 MED ORDER — ACYCLOVIR 400 MG PO TABS: ORAL_TABLET | ORAL | Status: DC

## 2012-06-27 MED ORDER — CEFUROXIME AXETIL 250 MG PO TABS: 250.0000 mg | ORAL_TABLET | Freq: Two times a day (BID) | ORAL | Status: DC

## 2012-06-28 ENCOUNTER — Other Ambulatory Visit: Payer: Self-pay | Admitting: Internal Medicine

## 2012-06-28 DIAGNOSIS — D72819 Decreased white blood cell count, unspecified: Secondary | ICD-10-CM

## 2012-06-28 NOTE — Telephone Encounter (Signed)
Spoke with pt and she went to her PCP and was given antibiotics to take for about 2 weeks per pt. Pt states she will call us back to schedule the EGD when the sinus infection is all cleared up.

## 2012-06-28 NOTE — Progress Notes (Signed)
Order placed for a follow up cbc

## 2012-06-30 ENCOUNTER — Other Ambulatory Visit: Payer: Self-pay | Admitting: *Deleted

## 2012-06-30 ENCOUNTER — Telehealth: Payer: Self-pay | Admitting: Gastroenterology

## 2012-07-03 ENCOUNTER — Other Ambulatory Visit: Payer: Self-pay | Admitting: *Deleted

## 2012-07-03 ENCOUNTER — Encounter: Payer: Self-pay | Admitting: Internal Medicine

## 2012-07-03 ENCOUNTER — Other Ambulatory Visit: Payer: Self-pay | Admitting: Gastroenterology

## 2012-07-03 DIAGNOSIS — R1310 Dysphagia, unspecified: Secondary | ICD-10-CM

## 2012-07-03 MED ORDER — DEXLANSOPRAZOLE 60 MG PO CPDR: 60.0000 mg | DELAYED_RELEASE_CAPSULE | Freq: Every day | ORAL | Status: DC

## 2012-07-03 NOTE — Assessment & Plan Note (Addendum)
Has known Barretts.  EGD 11/12/10 - bx c/w reflux gastroesophagitis.  Continue Dexilant.  Doing better.  Follow.  Due EGD.

## 2012-07-03 NOTE — Telephone Encounter (Signed)
Pts procedure rescheduled at the hospital 07/24/12@8am . Pt scheduled for EGD with dil and bravo ph study. New prep instructions mailed to pt.

## 2012-07-03 NOTE — Progress Notes (Signed)
Subjective:    Patient ID: Kelly Massey, female    DOB: 06-26-1965, 47 y.o.   MRN: [id number]  HPI 47 year old female with past history of hypercholesterolemia and GERD (with Barretts) who comes in today as a work in with concerns regarding increased congestion and cough.  States symptoms started several days ago.  Increased nasal congestion and sinus pressure.  Productive colored mucus.  Increased drainage.  Previous sore throat.  Decreased appetite.  Cough - productive.  Using saline.  Some decreased appetite.  No nausea or vomiting.  No fever.  Has taken childrens benadryl and mucinex.  Was due to have EGD this week.  Postponed because of infection.    Past Medical History  Diagnosis Date  . Arthritis   . Hypertension     ?  Marland Kitchen Hyperlipidemia     ?  Marland Kitchen Colon polyps   . HSV infection     History  . GERD (gastroesophageal reflux disease) 10/12/10    EGD, positive H. pylori  . Barrett's esophagus   . Post-operative nausea and vomiting   . Anemia      Current Outpatient Prescriptions on File Prior to Visit  Medication Sig Dispense Refill  . BIOTIN PO Take 1 tablet by mouth daily.      . Calcium Carbonate-Vitamin D (CALCIUM 600+D3) 600-400 MG-UNIT per tablet Take 1 tablet by mouth daily.      . Cholecalciferol (VITAMIN D PO) Take 2 tablets by mouth daily. Gummy formulation      . dexlansoprazole (DEXILANT) 60 MG capsule Take 60 mg by mouth daily.      . diphenhydrAMINE (BENADRYL) 25 mg capsule Take 25 mg by mouth at bedtime as needed. For allergies      . ferrous sulfate 325 (65 FE) MG tablet Take 325 mg by mouth 2 (two) times daily.      . mometasone (NASONEX) 50 MCG/ACT nasal spray Place 2 sprays into the nose daily.  17 g  1  . ranitidine (ZANTAC) 150 MG tablet Take 300 mg by mouth daily.       No current facility-administered medications on file prior to visit.    Review of Systems Patient denies any headache, lightheadedness or dizziness.  Does report increased sinus  pressure and nasal congestion.  Increased drainage.  No chest pain, tightness or palpitations.  No increased shortness of breath.  Does report increased cough - productive.   No nausea or vomiting.  No abdominal pain or cramping.  No bowel change, such as diarrhea, constipation, BRBPR or melana.  No urine change.        Objective:   Physical Exam  Filed Vitals:   06/27/12 1048  BP: 130/80  Pulse: 89  Temp: 98 F (36.7 C)   Blood pressure recheck:  122/82, pulse 37  47 year old female in no acute distress.   HEENT:  Nares - erythematous turbinates.  Minimal tenderness to palpation over the sinuses.  OP- without lesions or erythema.  TMs visualized - without erythema.   NECK:  Supple, nontender.   HEART:  Appears to be regular. LUNGS:  Without crackles or wheezing audible.  Respirations even and unlabored.     Assessment & Plan:  PROBABLE SINUSITIS/URI.  With need for EGD, will go ahead and treat with Ceftin 250mg  bid x 10 days.  Nasonex as directed.  Saline nasal flushes as directed.  Stop the Benadryl.  Mucinex as directed.  Follow.  Stay hydrated.  Notify me or  be reevaluated if symptoms change, worsen or do not resolve.   GYN.  S/P hysterectomy and doing well.  Follow.    CARDIOVASCULAR.  Currently asymptomatic.    ELEVATED BLOOD PRESSURE.  Blood pressure doing well on no meds.  Follow.    PREVIOUS ABNORMAL MAMMOGRAM.  Mammogram 10/12/10 rec follow up views.  These were performed 10/15/10 - revealed what was read as a benign lymph node - Birads II.  Saw Dr Lemar Livings.  Getting regular mammograms in [place].  Mammogram 11/16/11 - BiRADS I.  Recommended follow up mammogram in one year.   HEALTH MAINTENANCE.  Physical 09/09/11.  Pelvics through GYN (Dr Cherly Hensen).  Colonoscopy 10/12/10 with polyps removed.  Continues to follow with GI.

## 2012-07-03 NOTE — Assessment & Plan Note (Signed)
Due follow up EGD.  Treat infection. Plan follow up EGD.

## 2012-07-03 NOTE — Telephone Encounter (Signed)
Rx sent in to pharmacy. 

## 2012-07-11 ENCOUNTER — Telehealth: Payer: Self-pay | Admitting: Gastroenterology

## 2012-07-11 NOTE — Telephone Encounter (Signed)
Explained to pt that Dr. Arlyce Dice does not do these procedures on the same day due to billing issues. Let pt know that Dr. Arlyce Dice is out of town this week also.

## 2012-07-17 ENCOUNTER — Other Ambulatory Visit (INDEPENDENT_AMBULATORY_CARE_PROVIDER_SITE_OTHER): Payer: 59

## 2012-07-17 ENCOUNTER — Telehealth: Payer: Self-pay | Admitting: Gastroenterology

## 2012-07-17 DIAGNOSIS — D72819 Decreased white blood cell count, unspecified: Secondary | ICD-10-CM

## 2012-07-17 NOTE — Telephone Encounter (Signed)
Patient is scheduled for an upper endoscopy with bravo pH placement. This is a separate procedure from colonoscopy.  If she wants a colonoscopy for screening purposes I am agreeable to doing this but it should be done at the [facility] where it is less expensive. We do not 2 probable pH testing in the [facility].

## 2012-07-17 NOTE — Telephone Encounter (Signed)
Left a message for patient to call me. 

## 2012-07-17 NOTE — Telephone Encounter (Signed)
Spoke with patient and she is interested in having colonoscopy with EGD. Explained to patient that MD would have to decide if she needs to have colonoscopy and he may not want both procedures done at the same time.  Also, told patient the procedure might be moved to another day due to time constraints.She states if he does not want to do them at the same time, she will go back to [facility] because they will do them together. Please, advise.

## 2012-07-17 NOTE — Telephone Encounter (Signed)
Spoke with patient and gave her Dr. Marzetta Board recommendations. She wants to keep the EGD as scheduled. She will schedule colonoscopy after this test is completed.

## 2012-07-18 LAB — CBC WITH DIFFERENTIAL/PLATELET
Basophils Absolute: 0 10*3/uL (ref 0.0–0.1)
Basophils Relative: 0.8 % (ref 0.0–3.0)
Eosinophils Absolute: 0 10*3/uL (ref 0.0–0.7)
Eosinophils Relative: 0.5 % (ref 0.0–5.0)
HCT: 40.2 % (ref 36.0–46.0)
Hemoglobin: 13.7 g/dL (ref 12.0–15.0)
Lymphocytes Relative: 44.8 % (ref 12.0–46.0)
Lymphs Abs: 2 10*3/uL (ref 0.7–4.0)
MCHC: 34.1 g/dL (ref 30.0–36.0)
MCV: 91.9 fl (ref 78.0–100.0)
Monocytes Absolute: 0.4 10*3/uL (ref 0.1–1.0)
Monocytes Relative: 8.9 % (ref 3.0–12.0)
Neutro Abs: 2 10*3/uL (ref 1.4–7.7)
Neutrophils Relative %: 45 % (ref 43.0–77.0)
Platelets: 220 10*3/uL (ref 150.0–400.0)
RBC: 4.38 Mil/uL (ref 3.87–5.11)
RDW: 13.8 % (ref 11.5–14.6)
WBC: 4.4 10*3/uL — ABNORMAL LOW (ref 4.5–10.5)

## 2012-07-19 ENCOUNTER — Encounter: Payer: Self-pay | Admitting: *Deleted

## 2012-07-24 ENCOUNTER — Encounter (HOSPITAL_COMMUNITY)
Admission: RE | Disposition: A | Discharge status: Home / Self Care | Payer: Self-pay | Source: Ambulatory Visit | Attending: Gastroenterology

## 2012-07-24 ENCOUNTER — Encounter (HOSPITAL_COMMUNITY): Payer: Self-pay

## 2012-07-24 ENCOUNTER — Ambulatory Visit (HOSPITAL_COMMUNITY)
Admission: RE | Admit: 2012-07-24 | Discharge: 2012-07-24 | Disposition: A | Discharge status: Home / Self Care | Payer: 59 | Source: Ambulatory Visit | Attending: Gastroenterology | Admitting: Gastroenterology

## 2012-07-24 DIAGNOSIS — R1310 Dysphagia, unspecified: Secondary | ICD-10-CM | POA: Insufficient documentation

## 2012-07-24 DIAGNOSIS — K219 Gastro-esophageal reflux disease without esophagitis: Secondary | ICD-10-CM | POA: Insufficient documentation

## 2012-07-24 DIAGNOSIS — K2270 Barrett's esophagus without dysplasia: Secondary | ICD-10-CM

## 2012-07-24 DIAGNOSIS — Z880 Allergy status to penicillin: Secondary | ICD-10-CM | POA: Insufficient documentation

## 2012-07-24 DIAGNOSIS — Z888 Allergy status to other drugs, medicaments and biological substances status: Secondary | ICD-10-CM | POA: Insufficient documentation

## 2012-07-24 DIAGNOSIS — Z79899 Other long term (current) drug therapy: Secondary | ICD-10-CM | POA: Insufficient documentation

## 2012-07-24 DIAGNOSIS — D649 Anemia, unspecified: Secondary | ICD-10-CM | POA: Insufficient documentation

## 2012-07-24 DIAGNOSIS — Z9109 Other allergy status, other than to drugs and biological substances: Secondary | ICD-10-CM | POA: Insufficient documentation

## 2012-07-24 DIAGNOSIS — Z91013 Allergy to seafood: Secondary | ICD-10-CM | POA: Insufficient documentation

## 2012-07-24 DIAGNOSIS — Z886 Allergy status to analgesic agent status: Secondary | ICD-10-CM | POA: Insufficient documentation

## 2012-07-24 HISTORY — PX: BRAVO PH STUDY: SHX5421

## 2012-07-24 HISTORY — PX: ESOPHAGOGASTRODUODENOSCOPY: SHX5428

## 2012-07-24 HISTORY — PX: BALLOON DILATION: SHX5330

## 2012-07-24 SURGERY — EGD (ESOPHAGOGASTRODUODENOSCOPY)
Anesthesia: Moderate Sedation

## 2012-07-24 MED ORDER — MIDAZOLAM HCL 10 MG/2ML IJ SOLN
INTRAMUSCULAR | Status: DC | PRN
  Administered 2012-07-24 (×2): 2.5 mg via INTRAVENOUS
  Administered 2012-07-24 (×2): 2 mg via INTRAVENOUS
  Administered 2012-07-24: 1 mg via INTRAVENOUS

## 2012-07-24 MED ORDER — FENTANYL CITRATE 0.05 MG/ML IJ SOLN
INTRAMUSCULAR | Status: DC | PRN
  Administered 2012-07-24 (×4): 25 ug via INTRAVENOUS

## 2012-07-24 MED ORDER — FENTANYL CITRATE 0.05 MG/ML IJ SOLN
INTRAMUSCULAR | Status: AC
  Filled 2012-07-24: qty 2

## 2012-07-24 MED ORDER — SODIUM CHLORIDE 0.9 % IV SOLN
INTRAVENOUS | Status: DC
Start: 2012-07-24 — End: 2012-07-24

## 2012-07-24 MED ORDER — MIDAZOLAM HCL 10 MG/2ML IJ SOLN
INTRAMUSCULAR | Status: AC
  Filled 2012-07-24: qty 2

## 2012-07-24 MED ORDER — BUTAMBEN-TETRACAINE-BENZOCAINE 2-2-14 % EX AERO
INHALATION_SPRAY | CUTANEOUS | Status: DC | PRN
  Administered 2012-07-24: 2 via TOPICAL

## 2012-07-24 MED ORDER — SODIUM CHLORIDE 0.9 % IV SOLN
INTRAVENOUS | Status: DC
  Administered 2012-07-24: 09:00:00 via INTRAVENOUS

## 2012-07-24 NOTE — H&P (Signed)
History of Present Illness: Kelly Massey 47 year old African female referred at the request of Dr. Lorin Picket for evaluation of reflux and dysphagia. She's had reflux for years for which she is taking various PPIs and H2 receptor antagonists. She apparently has developed leukopenia felt secondary to her medications. She currently is on a regimen of dexilant in the mornings and Zantac each bedtime. She has intermittent break through pyrosis but not at any particular time of day. She complains of dysphagia to solids. Sore throat and hoarseness are frequent. She is not coughing.   Patient also has a history of Barrett's esophagus. Last examined 2-3 years ago apparently did not demonstrate Barrett's. She had a colonoscopy within the last 2 years that apparently was normal.        Past Medical History   Diagnosis  Date   .  Arthritis     .  Hypertension         ?   Marland Kitchen  Hyperlipidemia         ?   Marland Kitchen  Colon polyps     .  HSV infection         History   .  GERD (gastroesophageal reflux disease)  10/12/10       EGD, positive H. pylori   .  Barrett's esophagus     .  Post-operative nausea and vomiting     .  Anemia         Past Surgical History   Procedure  Laterality  Date   .  Shoulder arthroscopy  Left     .  Dilation and curettage of uterus           SAB   .  Abdominal hysterectomy       .  Ovarian cyst removal          family history includes Arthritis in her mother; Breast cancer in her maternal aunt; Dementia in her mother; Diabetes in her brother, maternal uncle, paternal grandfather, paternal grandmother, and sister; Heart failure in her mother; Hyperlipidemia in her other; Hypertension in her mother and other; Lung cancer in her father; Prostate cancer in her maternal uncle; Stroke in her mother; and Throat cancer in her maternal aunt.  There is no history of Colon cancer. Current Outpatient Prescriptions   Medication  Sig  Dispense  Refill   .  acyclovir (ZOVIRAX)  400 MG tablet  Take 400 mg by mouth 2 (two) times daily as needed. For outbreaks         .  BIOTIN PO  Take 1 tablet by mouth daily.         .  Calcium Carbonate-Vitamin D (CALCIUM 600+D3) 600-400 MG-UNIT per tablet  Take 1 tablet by mouth daily.         .  Cholecalciferol (VITAMIN D PO)  Take 2 tablets by mouth daily. Gummy formulation         .  dexlansoprazole (DEXILANT) 60 MG capsule  Take 60 mg by mouth daily.         .  diphenhydrAMINE (BENADRYL) 25 mg capsule  Take 25 mg by mouth at bedtime as needed. For allergies         .  ferrous sulfate 325 (65 FE) MG tablet  Take 325 mg by mouth 2 (two) times daily.         .  mometasone (NASONEX)  50 MCG/ACT nasal spray  Place 2 sprays into the nose daily.   17 g   1   .  ranitidine (ZANTAC) 150 MG tablet  Take 300 mg by mouth daily.             No current facility-administered medications for this visit.       Allergies as of 06/12/2012 - Review Complete 06/12/2012   Allergen  Reaction  Noted   .  Adhesive (tape)  Other (See Comments)  10/15/2011   .  Aspirin  Nausea And Vomiting  02/08/2012   .  Ibuprofen  Nausea And Vomiting  02/08/2012   .  Penicillins  Diarrhea  02/08/2012   .  Nsaids  Other (See Comments)  10/04/2011   .  Oxycodone  Other (See Comments)  02/08/2012   .  Shrimp (shellfish allergy)  Nausea And Vomiting  10/21/2011       reports that she has never smoked. She has never used smokeless tobacco. She reports that she does not drink alcohol or use illicit drugs.         Review of Systems: Pertinent positive and negative review of systems were noted in the above HPI section. All other review of systems were otherwise negative.   Vital signs were reviewed in today's medical record Physical Exam: General: Well developed , well nourished, no acute distress Skin: anicteric Head: Normocephalic and atraumatic Eyes:  sclerae anicteric, EOMI Ears: Normal auditory acuity Mouth: No deformity or lesions Neck: Supple, no  masses or thyromegaly Lungs: Clear throughout to auscultation Heart: Regular rate and rhythm; no murmurs, rubs or bruits Abdomen: Soft, non tender and non distended. No masses, hepatosplenomegaly or hernias noted. Normal Bowel sounds Rectal:deferred Musculoskeletal: Symmetrical with no gross deformities   Skin: No lesions on visible extremities Pulses:  Normal pulses noted Extremities: No clubbing, cyanosis, edema or deformities noted Neurological: Alert oriented x 4, grossly nonfocal Cervical Nodes:  No significant cervical adenopathy Inguinal Nodes: No significant inguinal adenopathy Psychological:  Alert and cooperative. Normal mood and affect                      Barrett's esophagus - Louis Meckel, MD at 06/12/2012  4:13 PM    Status: Written Related Problem: Barrett's esophagus           History of Barrett's esophagus. Apparently endoscopy from 2-3 years ago did not demonstrate this.   Recommendations #1 upper endoscopy         Dysphagia, unspecified - Louis Meckel, MD at 06/12/2012  4:13 PM    Status: Written Related Problem: Dysphagia, unspecified           Suspect peptic stricture.   Recommendations #1 upper endoscopy with dilation as indicated         GERD (gastroesophageal reflux disease) - Louis Meckel, MD at 06/12/2012  4:14 PM    Status: Linus Orn Related Problem: GERD (gastroesophageal reflux disease)           While pyrosis is moderately well-controlled, symptoms of hoarseness and sore throat could be a manifestation of ongoing acid reflux. Note patient has had leukopenia associated with acid blocker therapy   Recommendations #1 continue current medications #248 hour bravo pH probe study while patient is taking medications           Revision History

## 2012-07-24 NOTE — Op Note (Addendum)
[facility] [address] [place], [address]   ENDOSCOPY PROCEDURE REPORT  PATIENT: Kelly Massey, Kelly Massey  MR#: [id number] BIRTHDATE: 1965/11/27 , 47  yrs. old GENDER: Female ENDOSCOPIST: Louis Meckel, MD REFERRED BY: PROCEDURE DATE:  07/24/2012 PROCEDURE:  EGD w/ Bravo capsule placement, EGD w/ biopsy , and Maloney dilation of esophagus; h/o Barrett's esophagus ASA CLASS:     Class II INDICATIONS:  Dysphagia.   History of esophageal reflux. MEDICATIONS: These medications were titrated to patient response per physician's verbal order, Versed 10 mg IV, and Fentanyl 100 mcg IV  TOPICAL ANESTHETIC: Cetacaine Spray  DESCRIPTION OF PROCEDURE: After the risks benefits and alternatives of the procedure were thoroughly explained, informed consent was obtained.  The Pentax Gastroscope [id number] endoscope was introduced through the mouth and advanced to the third portion of the duodenum. Without limitations.  The instrument was slowly withdrawn as the mucosa was fully examined.      The upper, middle and distal third of the esophagus were carefully inspected and no abnormalities were noted.  The z-line was well seen at the GEJ at 44cm from the incisors.   The endoscope was pushed into the fundus which was normal including a retroflexed view.  The antrum, gastric body, first and second part of the duodenum were unremarkable.        Because of a history of Barrett's esophagus, multiple biopsies were taken at the GE junction. The scope was then withdrawn from the patient.  a 52 Maloney dilator was passed because of complaints of dysphagia. There was no resistance and no heme. Following this a bravo pH probe was placed at 38 cm from the incisors.  COMPLICATIONS: There were no complications.  ENDOSCOPIC IMPRESSION: 1.  GERD-status post Elease Hashimoto dilation because of dysphagia without stricture, status post probable pH probe placement  RECOMMENDATIONS: 1.  continue  current medications #2 office visit 2- 3 weeks REPEAT EXAM:  eSigned:  Louis Meckel, MD 07/24/2012 9:30 AM Revised: 07/24/2012 9:30 AM  CC: Dale Booker, MD

## 2012-07-25 ENCOUNTER — Encounter: Payer: Self-pay | Admitting: Gastroenterology

## 2012-07-25 ENCOUNTER — Telehealth: Payer: Self-pay | Admitting: Gastroenterology

## 2012-07-25 ENCOUNTER — Encounter (HOSPITAL_COMMUNITY): Payer: Self-pay | Admitting: Gastroenterology

## 2012-07-25 NOTE — Telephone Encounter (Signed)
Pt states she is having some chest discomfort and back pain following the EGD with dil and bravo ph yesterday. Pt was told to hold her dexilant yesterday and she does not know if she should take the dexilant now. Called pt back and in the meantime she called [facility] endo unit and Dr. Arlyce Dice is going to call the pt.

## 2012-07-26 ENCOUNTER — Telehealth: Payer: Self-pay | Admitting: Gastroenterology

## 2012-07-26 NOTE — Telephone Encounter (Signed)
Spoke with pt and she was instructed to take the black box along with her diary back to the hospital. Pt verbalized understanding.

## 2012-07-31 ENCOUNTER — Encounter: Payer: Self-pay | Admitting: Gastroenterology

## 2012-07-31 DIAGNOSIS — K219 Gastro-esophageal reflux disease without esophagitis: Secondary | ICD-10-CM

## 2012-07-31 NOTE — Progress Notes (Unsigned)
Patient ID: Kelly Massey, female   DOB: 03/30/1966, 47 y.o.   MRN: [id number] Patient has persistent versus and sore throat despite taking dexilant and ranitidine.  48 hour bravo pH study was performed with the patient taking dexilant and ranitidine.  On day one the percentage of time with pH less than 4 was under 2%. Total DeMeester score for day one was 5.7.  On day 2 the percentage of time below pH 4 was 0.1%. Total DeMeester score was 0.9.  Findings are diagnostic for the absence of significant acid reflux during the 48-hour period.

## 2012-08-07 ENCOUNTER — Ambulatory Visit (INDEPENDENT_AMBULATORY_CARE_PROVIDER_SITE_OTHER): Payer: 59 | Admitting: Internal Medicine

## 2012-08-07 ENCOUNTER — Encounter: Payer: Self-pay | Admitting: Internal Medicine

## 2012-08-07 VITALS — BP 110/80 | HR 90 | Temp 98.4°F | Ht 66.0 in | Wt 188.5 lb

## 2012-08-07 DIAGNOSIS — Z9109 Other allergy status, other than to drugs and biological substances: Secondary | ICD-10-CM

## 2012-08-07 DIAGNOSIS — E049 Nontoxic goiter, unspecified: Secondary | ICD-10-CM

## 2012-08-07 DIAGNOSIS — E7800 Pure hypercholesterolemia, unspecified: Secondary | ICD-10-CM

## 2012-08-07 DIAGNOSIS — K219 Gastro-esophageal reflux disease without esophagitis: Secondary | ICD-10-CM

## 2012-08-07 DIAGNOSIS — R319 Hematuria, unspecified: Secondary | ICD-10-CM

## 2012-08-07 DIAGNOSIS — K2270 Barrett's esophagus without dysplasia: Secondary | ICD-10-CM

## 2012-08-07 DIAGNOSIS — D72819 Decreased white blood cell count, unspecified: Secondary | ICD-10-CM

## 2012-08-08 ENCOUNTER — Encounter: Payer: Self-pay | Admitting: Internal Medicine

## 2012-08-08 ENCOUNTER — Telehealth: Payer: Self-pay | Admitting: Internal Medicine

## 2012-08-08 NOTE — Assessment & Plan Note (Signed)
Receiving injections now.  Follow.  

## 2012-08-08 NOTE — Telephone Encounter (Signed)
Schedule a physical in 3 months.  Thanks.

## 2012-08-08 NOTE — Progress Notes (Signed)
Subjective:    Patient ID: Kelly Massey, female    DOB: 13-Sep-1965, 47 y.o.   MRN: [id number]  HPI 47 year old female with past history of hypercholesterolemia and GERD (with Barretts) who comes in today for a scheduled follow up.  She recently had a GI evaluation.  Had EGD and esophageal pH probe.  States she has follow up with GI on 08/22/12.  States was told her "aced level was normal".  She is back on her Dexilant.  Having no burning now.  Some burping.  No abdominal pain.  Some minimal constipation.  Last bowel movement today.  She did have some right mid back pain.  No radiation down her leg.  Did notice some faint blood in her urine.  Back pain has improved now.  Is reproducible to palpation.  No further blood noticed in her urine.  No dysuria.  No increased urinary frequency.  No vaginal symptoms.  Has done well since her hysterectomy.  Eating and drinking well.    Past Medical History  Diagnosis Date  . Arthritis   . Hyperlipidemia     ?  Marland Kitchen Colon polyps   . HSV infection     History  . GERD (gastroesophageal reflux disease) 10/12/10    EGD, positive H. pylori  . Barrett's esophagus   . Anemia   . Post-operative nausea and vomiting      Current Outpatient Prescriptions on File Prior to Visit  Medication Sig Dispense Refill  . acyclovir (ZOVIRAX) 400 MG tablet Take one tablet bid  60 tablet  2  . BIOTIN PO Take 1 tablet by mouth daily.      . Calcium Carbonate-Vitamin D (CALCIUM 600+D3) 600-400 MG-UNIT per tablet Take 1 tablet by mouth daily.      . Cholecalciferol (VITAMIN D PO) Take 2 tablets by mouth daily. Gummy formulation      . dexlansoprazole (DEXILANT) 60 MG capsule Take 1 capsule (60 mg total) by mouth daily.  30 capsule  5  . diphenhydrAMINE (BENADRYL) 25 mg capsule Take 25 mg by mouth at bedtime as needed. For allergies      . ferrous sulfate 325 (65 FE) MG tablet Take 325 mg by mouth 2 (two) times daily.      . mometasone (NASONEX) 50 MCG/ACT nasal spray Place 2  sprays into the nose daily.  17 g  1  . ranitidine (ZANTAC) 150 MG tablet Take 300 mg by mouth daily.       No current facility-administered medications on file prior to visit.    Review of Systems Patient denies any headache, lightheadedness or dizziness.  No significant sinus or allergy symptoms.  No chest pain, tightness or palpitations.  No increased shortness of breath.  No cough or congestion.  No nausea or vomiting.  No abdominal pain or cramping.  No bowel change, such as diarrhea, BRBPR or melana.  Some minimal constipation.  Last bowel movement today.  Some blood noticed with wiping.  Was assuming blood in the urine.  No vaginal symptoms.  Back pain as outlined.       Objective:   Physical Exam  Filed Vitals:   08/07/12 1634  BP: 110/80  Pulse: 90  Temp: 98.4 F (36.9 C)   Pulse 42  47 year old female in no acute distress.   HEENT:  Nares- clear.  Oropharynx - without lesions. NECK:  Supple.  Nontender.  No audible bruit.  HEART:  Appears to be regular. LUNGS:  No crackles or wheezing audible.  Respirations even and unlabored.  RADIAL PULSE:  Equal bilaterally. ABDOMEN:  Soft, nontender.  Bowel sounds present and normal.  No audible abdominal bruit.  BACK:  Some increased tenderness to palpation over the mid right lateral back.  Reproducible pain to palpation.   EXTREMITIES:  No increased edema present.  DP pulses palpable and equal bilaterally.        Assessment & Plan:  BACK PAIN.  Appears to be more msk in origin.  Reproducible to palpation.  Tylenol as directed.  Follow.  She was questioning a possible kidney stone.  Does not appear to be c/w a stone given the reproducible pain on exam.  Will check urine to assess for blood.     GYN.  S/P hysterectomy and doing well.  Follow.    CARDIOVASCULAR.  Currently asymptomatic.    ELEVATED BLOOD PRESSURE.  Blood pressure doing well on no meds.  Follow.    PREVIOUS ABNORMAL MAMMOGRAM.  Mammogram 10/12/10 rec follow up  views.  These were performed 10/15/10 - revealed what was read as a benign lymph node - Birads II.  Saw Dr Lemar Livings.  Getting regular mammograms in [place].  Mammogram 11/16/11 - BiRADS I.  Recommended follow up mammogram in one year.   HEALTH MAINTENANCE.  Physical 09/09/11.  Pelvics through GYN (Dr Cherly Hensen).  Colonoscopy 10/12/10 with polyps removed.  Continues to follow with GI.

## 2012-08-08 NOTE — Assessment & Plan Note (Signed)
Has a history of leukopenia and neutrapenia.  HIV and ANA checked in the past and negative.  TSH, B12, folate and SIEP all negative.  She was taken off Zegrid and Aciphex secondary to the leukopenia.  On Dexilant.  Follow cbc.   

## 2012-08-08 NOTE — Assessment & Plan Note (Signed)
Previous ultrasound revealed no nodules.  Follow TSH.     

## 2012-08-08 NOTE — Assessment & Plan Note (Signed)
Just had follow up EGD.  See report for details.  On Dexilant.  Symptoms controlled now.  Follow. Keep follow up appt with GI.

## 2012-08-08 NOTE — Assessment & Plan Note (Signed)
Has known Barretts.  EGD 11/12/10 - bx c/w reflux gastroesophagitis.  Just had a follow up EGD.  See report for details.  Continue Dexilant.  Doing better.  Follow.

## 2012-08-08 NOTE — Assessment & Plan Note (Signed)
Low cholesterol diet and exercise.  Follow.  Follow lipid panel.   

## 2012-08-08 NOTE — Telephone Encounter (Signed)
appointmet 8/14 @ 3 pt aware

## 2012-08-09 ENCOUNTER — Ambulatory Visit: Payer: 59 | Admitting: Gastroenterology

## 2012-08-22 ENCOUNTER — Encounter: Payer: Self-pay | Admitting: Gastroenterology

## 2012-08-22 ENCOUNTER — Ambulatory Visit (INDEPENDENT_AMBULATORY_CARE_PROVIDER_SITE_OTHER): Payer: 59 | Admitting: Gastroenterology

## 2012-08-22 VITALS — BP 106/72 | HR 88 | Ht 66.0 in | Wt 189.1 lb

## 2012-08-22 DIAGNOSIS — K2270 Barrett's esophagus without dysplasia: Secondary | ICD-10-CM

## 2012-08-22 DIAGNOSIS — R1310 Dysphagia, unspecified: Secondary | ICD-10-CM

## 2012-08-22 NOTE — Assessment & Plan Note (Signed)
The last 2 endoscopies failed to demonstrate Barrett's epithelium. There is no documentation of Barrett's but just a note in her past medical history of a history of Barrett's. Endoscopically the GE junction appeared normal.  Recommendations #1 continue PPI therapy #2 no further surveillance examination

## 2012-08-22 NOTE — Assessment & Plan Note (Signed)
Resolved following dilatation therapy

## 2012-08-22 NOTE — Progress Notes (Signed)
History of Present Illness:  The patient has returned following upper endoscopy with dilatation and 48 hour bravo pH probe study.  No stricture was seen but she was empirically dilated. Biopsies were taken of the GE junction which showed inflammatory changes only. Bravo study did not demonstrate any significant acid reflux. The patient's only complaint is a lump feeling in the back of her throat when she awakens in the mornings which she attributes to sinus discharge. With Mucinex she tends to cough and easily breaks up the fullness she feels.   Review of Systems: Pertinent positive and negative review of systems were noted in the above HPI section. All other review of systems were otherwise negative.    Current Medications, Allergies, Past Medical History, Past Surgical History, Family History and Social History were reviewed in [employer] electronic medical record  Vital signs were reviewed in today's medical record. Physical Exam: General: Well developed , well nourished, no acute distress

## 2012-08-22 NOTE — Patient Instructions (Addendum)
Follow up as needed

## 2012-08-24 ENCOUNTER — Telehealth: Payer: Self-pay | Admitting: *Deleted

## 2012-08-24 NOTE — Telephone Encounter (Signed)
(  FYI)-Pt walked-in requesting a cough syrup after she has been up x 3 nights (coughing). Requesting the "yellow" syrup, not the "red" one this time. Informed pt that Dr. Lorin Picket is not in the office today & she would need to be seen for a Rx. She is scheduled to see Raquel tomorrow at 8:45

## 2012-08-25 ENCOUNTER — Ambulatory Visit: Payer: 59 | Admitting: Adult Health

## 2012-10-18 ENCOUNTER — Other Ambulatory Visit: Payer: Self-pay

## 2012-10-18 DIAGNOSIS — Z1231 Encounter for screening mammogram for malignant neoplasm of breast: Secondary | ICD-10-CM

## 2012-10-31 ENCOUNTER — Ambulatory Visit (INDEPENDENT_AMBULATORY_CARE_PROVIDER_SITE_OTHER): Payer: 59 | Admitting: Internal Medicine

## 2012-10-31 ENCOUNTER — Encounter: Payer: Self-pay | Admitting: Internal Medicine

## 2012-10-31 VITALS — BP 118/80 | HR 85 | Temp 98.5°F | Ht 66.0 in | Wt 191.2 lb

## 2012-10-31 DIAGNOSIS — N390 Urinary tract infection, site not specified: Secondary | ICD-10-CM

## 2012-10-31 DIAGNOSIS — R109 Unspecified abdominal pain: Secondary | ICD-10-CM

## 2012-10-31 DIAGNOSIS — K219 Gastro-esophageal reflux disease without esophagitis: Secondary | ICD-10-CM

## 2012-10-31 LAB — POCT URINALYSIS DIPSTICK
Bilirubin, UA: NEGATIVE
Glucose, UA: NEGATIVE
Ketones, UA: NEGATIVE
Leukocytes, UA: NEGATIVE
Nitrite, UA: NEGATIVE
Protein, UA: NEGATIVE
Spec Grav, UA: 1.025
Urobilinogen, UA: 0.2
pH, UA: 7.5

## 2012-11-01 ENCOUNTER — Ambulatory Visit: Payer: Self-pay | Admitting: Internal Medicine

## 2012-11-01 LAB — CBC WITH DIFFERENTIAL/PLATELET
Basophils Absolute: 0 10*3/uL (ref 0.0–0.1)
Basophils Relative: 0.7 % (ref 0.0–3.0)
Eosinophils Absolute: 0 10*3/uL (ref 0.0–0.7)
Eosinophils Relative: 0.8 % (ref 0.0–5.0)
HCT: 40.2 % (ref 36.0–46.0)
Hemoglobin: 13.5 g/dL (ref 12.0–15.0)
Lymphocytes Relative: 41.5 % (ref 12.0–46.0)
Lymphs Abs: 1.7 10*3/uL (ref 0.7–4.0)
MCHC: 33.7 g/dL (ref 30.0–36.0)
MCV: 93.4 fl (ref 78.0–100.0)
Monocytes Absolute: 0.6 10*3/uL (ref 0.1–1.0)
Monocytes Relative: 13.7 % — ABNORMAL HIGH (ref 3.0–12.0)
Neutro Abs: 1.7 10*3/uL (ref 1.4–7.7)
Neutrophils Relative %: 43.3 % (ref 43.0–77.0)
Platelets: 219 10*3/uL (ref 150.0–400.0)
RBC: 4.3 Mil/uL (ref 3.87–5.11)
RDW: 14.2 % (ref 11.5–14.6)
WBC: 4 10*3/uL — ABNORMAL LOW (ref 4.5–10.5)

## 2012-11-01 LAB — BASIC METABOLIC PANEL
BUN: 6 mg/dL (ref 6–23)
CO2: 29 mEq/L (ref 19–32)
Calcium: 9.1 mg/dL (ref 8.4–10.5)
Chloride: 104 mEq/L (ref 96–112)
Creatinine, Ser: 1 mg/dL (ref 0.4–1.2)
GFR: 78.11 mL/min (ref >60.00)
Glucose, Bld: 81 mg/dL (ref 70–99)
Potassium: 4.2 mEq/L (ref 3.5–5.1)
Sodium: 137 mEq/L (ref 135–145)

## 2012-11-01 LAB — HEPATIC FUNCTION PANEL
ALT: 17 U/L (ref 0–35)
AST: 18 U/L (ref 0–37)
Albumin: 4 g/dL (ref 3.5–5.2)
Alkaline Phosphatase: 48 U/L (ref 39–117)
Bilirubin, Direct: 0.1 mg/dL (ref 0.0–0.3)
Total Bilirubin: 0.5 mg/dL (ref 0.3–1.2)
Total Protein: 6.7 g/dL (ref 6.0–8.3)

## 2012-11-02 ENCOUNTER — Other Ambulatory Visit: Payer: Self-pay | Admitting: Internal Medicine

## 2012-11-02 ENCOUNTER — Encounter: Payer: Self-pay | Admitting: Internal Medicine

## 2012-11-02 DIAGNOSIS — R1032 Left lower quadrant pain: Secondary | ICD-10-CM

## 2012-11-02 NOTE — Assessment & Plan Note (Signed)
Has known Barretts.  EGD 11/12/10 - bx c/w reflux gastroesophagitis.  Continue Dexilant.  Doing better.  Follow.   

## 2012-11-02 NOTE — Progress Notes (Signed)
Subjective:    Patient ID: Kelly Massey, female    DOB: 10-Aug-1965, 47 y.o.   MRN: [id number]  Abdominal Pain  47 year old female with past history of hypercholesterolemia and GERD (with Barretts) who comes in today as a work in with concerns regarding increased left lower quadrant pain.  She recently had a GI evaluation.  Had EGD and esophageal pH probe.  She is back on her Dexilant.  No significant acid reflux now.  Last bowel movement today.   No dysuria.  some increased urinary frequency, but she has been drinking more water.  No vaginal symptoms.  She reports that the left lower quadrant pain is intermittent.  This episode started yesterday.  Localized to to LLQ and lower abdomen to midline.  No back pain.  Eating and drinking well.  Is s/p hysterectomy.  Still has ovaries.     Past Medical History  Diagnosis Date  . Arthritis   . Hyperlipidemia     ?  Marland Kitchen Colon polyps   . HSV infection     History  . GERD (gastroesophageal reflux disease) 10/12/10    EGD, positive H. pylori  . Barrett's esophagus   . Anemia   . Post-operative nausea and vomiting     Current Outpatient Prescriptions on File Prior to Visit  Medication Sig Dispense Refill  . acyclovir (ZOVIRAX) 400 MG tablet Take one tablet bid  60 tablet  2  . BIOTIN PO Take 1 tablet by mouth daily.      . Calcium Carbonate-Vitamin D (CALCIUM 600+D3) 600-400 MG-UNIT per tablet Take 1 tablet by mouth daily.      . Cholecalciferol (VITAMIN D PO) Take 2 tablets by mouth daily. Gummy formulation      . dexlansoprazole (DEXILANT) 60 MG capsule Take 1 capsule (60 mg total) by mouth daily.  30 capsule  5  . diphenhydrAMINE (BENADRYL) 25 mg capsule Take 25 mg by mouth at bedtime as needed. For allergies      . ferrous sulfate 325 (65 FE) MG tablet Take 325 mg by mouth 2 (two) times daily.      . mometasone (NASONEX) 50 MCG/ACT nasal spray Place 2 sprays into the nose daily.  17 g  1  . ranitidine (ZANTAC) 150 MG tablet Take 300 mg by  mouth daily.       No current facility-administered medications on file prior to visit.    Review of Systems  Gastrointestinal: Positive for abdominal pain.  Patient denies any fever.  No nausea or vomiting.  Acid reflux controlled.  On Dexilant.   Eating and drinking well.  No bowel change, such as constipation, diarrhea, BRBPR or melana.  Last bowel movement today.   No vaginal symptoms.  No back pain.  LLQ pain as outlined.  Intermittent.  No known triggers.  Does not appear to change with eating or having bowel movements.        Objective:   Physical Exam  Filed Vitals:   10/31/12 1616  BP: 118/80  Pulse: 85  Temp: 98.5 F (36.9 C)   Pulse 43  47 year old female in no acute distress.  NECK:  Supple.  Nontender.    HEART:  Appears to be regular. LUNGS:  No crackles or wheezing audible.  Respirations even and unlabored.  RADIAL PULSE:  Equal bilaterally. ABDOMEN:  Soft.  Some tenderness to palpation over the LLQ.  No rebound or guarding.  Bowel sounds present and normal.  No audible abdominal  bruit.  BACK: non tender.  No CVA tenderness.       Assessment & Plan:  LLQ PAIN.  Urine dip negative.  Check cbc, met b and liver panel.  No bowel change.  Given persistent intermittent pain, will obtain CT abdomen and pelvis.  Tylenol prn as directed.      GYN.  S/P hysterectomy.  Ovaries left in place.  Check CT as outlined.  No vaginal symptoms.    CARDIOVASCULAR.  Currently asymptomatic.    ELEVATED BLOOD PRESSURE.  Blood pressure doing well on no meds.  Follow.    PREVIOUS ABNORMAL MAMMOGRAM.  Mammogram 10/12/10 rec follow up views.  These were performed 10/15/10 - revealed what was read as a benign lymph node - Birads II.  Saw Dr Lemar Livings.  Getting regular mammograms in [place].  Mammogram 11/16/11 - BiRADS I.  Recommended follow up mammogram in one year.   HEALTH MAINTENANCE.  Pelvics through GYN (Dr Cherly Hensen).  Colonoscopy 10/12/10 with polyps removed.  Continues to follow with GI.

## 2012-11-02 NOTE — Progress Notes (Signed)
Pt notified of CT scan results and ovarian cyst with septation vs multiple ovarian cysts.   GYN referral ordered.

## 2012-11-06 ENCOUNTER — Telehealth: Payer: Self-pay | Admitting: Internal Medicine

## 2012-11-09 ENCOUNTER — Encounter: Payer: Self-pay | Admitting: Internal Medicine

## 2012-11-16 ENCOUNTER — Encounter: Payer: Self-pay | Admitting: Internal Medicine

## 2012-11-16 ENCOUNTER — Ambulatory Visit (INDEPENDENT_AMBULATORY_CARE_PROVIDER_SITE_OTHER): Payer: 59 | Admitting: Internal Medicine

## 2012-11-16 VITALS — BP 110/80 | HR 88 | Temp 97.9°F | Ht 65.5 in | Wt 190.5 lb

## 2012-11-16 DIAGNOSIS — M25552 Pain in left hip: Secondary | ICD-10-CM

## 2012-11-16 DIAGNOSIS — D72819 Decreased white blood cell count, unspecified: Secondary | ICD-10-CM

## 2012-11-16 DIAGNOSIS — K219 Gastro-esophageal reflux disease without esophagitis: Secondary | ICD-10-CM

## 2012-11-16 DIAGNOSIS — Z9109 Other allergy status, other than to drugs and biological substances: Secondary | ICD-10-CM

## 2012-11-16 DIAGNOSIS — E7800 Pure hypercholesterolemia, unspecified: Secondary | ICD-10-CM

## 2012-11-16 DIAGNOSIS — M25559 Pain in unspecified hip: Secondary | ICD-10-CM

## 2012-11-16 DIAGNOSIS — K2270 Barrett's esophagus without dysplasia: Secondary | ICD-10-CM

## 2012-11-16 DIAGNOSIS — E049 Nontoxic goiter, unspecified: Secondary | ICD-10-CM

## 2012-11-18 ENCOUNTER — Encounter: Payer: Self-pay | Admitting: Internal Medicine

## 2012-11-18 NOTE — Assessment & Plan Note (Signed)
Has a history of leukopenia and neutrapenia.  HIV and ANA checked in the past and negative.  TSH, B12, folate and SIEP all negative.  She was taken off Zegrid and Aciphex secondary to the leukopenia.  On Dexilant.  Follow cbc.   

## 2012-11-18 NOTE — Assessment & Plan Note (Signed)
Low cholesterol diet and exercise.  Follow.  Follow lipid panel.   

## 2012-11-18 NOTE — Assessment & Plan Note (Signed)
Receiving injections now.  Follow.  

## 2012-11-18 NOTE — Assessment & Plan Note (Signed)
Previous ultrasound revealed no nodules.  Follow TSH.     

## 2012-11-18 NOTE — Assessment & Plan Note (Signed)
Just had follow up EGD.  See report for details.  On Dexilant.  Symptoms controlled now.  Follow. Keep follow up appt with GI.

## 2012-11-18 NOTE — Progress Notes (Signed)
Subjective:    Patient ID: Kelly Massey, female    DOB: 1965/07/17, 47 y.o.   MRN: [id number]  HPI 47 year old female with past history of hypercholesterolemia and GERD (with Barretts) who comes in today to follow up on these issues as well as for a complete physical exam.  She recently had a GI evaluation.  Had EGD and esophageal pH probe.  She is on Dexilant.  Having no burning now.  Acid reflux better.  Last visit, she had some increased left lower quadrant pain.  Had CT that revealed left ovarian cyst.  Saw gyn.  Has follow up planned 12/08/12.  Pain is better.  Still notices some discomfort at times.  Eating and drinking well.  No dysuria.  No increased urinary frequency.  No vaginal symptoms.  Has done well since her hysterectomy.  Eating and drinking well.    Past Medical History  Diagnosis Date  . Arthritis   . Hyperlipidemia     ?  Marland Kitchen Colon polyps   . HSV infection     History  . GERD (gastroesophageal reflux disease) 10/12/10    EGD, positive H. pylori  . Barrett's esophagus   . Anemia   . Post-operative nausea and vomiting      Current Outpatient Prescriptions on File Prior to Visit  Medication Sig Dispense Refill  . acyclovir (ZOVIRAX) 400 MG tablet Take one tablet bid  60 tablet  2  . BIOTIN PO Take 1 tablet by mouth daily.      . Calcium Carbonate-Vitamin D (CALCIUM 600+D3) 600-400 MG-UNIT per tablet Take 1 tablet by mouth daily.      . Cholecalciferol (VITAMIN D PO) Take 2 tablets by mouth daily. Gummy formulation      . dexlansoprazole (DEXILANT) 60 MG capsule Take 1 capsule (60 mg total) by mouth daily.  30 capsule  5  . diphenhydrAMINE (BENADRYL) 25 mg capsule Take 25 mg by mouth at bedtime as needed. For allergies      . ferrous sulfate 325 (65 FE) MG tablet Take 325 mg by mouth daily.       . mometasone (NASONEX) 50 MCG/ACT nasal spray Place 2 sprays into the nose daily.  17 g  1  . ranitidine (ZANTAC) 150 MG tablet Take 300 mg by mouth daily.       No current  facility-administered medications on file prior to visit.    Review of Systems Patient denies any headache, lightheadedness or dizziness.  No significant sinus or allergy symptoms.  No chest pain, tightness or palpitations.  No increased shortness of breath.  No cough or congestion.  No nausea or vomiting.  No significant abdominal pain or cramping currently.  No bowel change, such as diarrhea, BRBPR or melana.  Bowels stable.  No vaginal symptoms.  Increased left hip pain.  Persistent.        Objective:   Physical Exam  Filed Vitals:   11/16/12 1522  BP: 110/80  Pulse: 88  Temp: 97.9 F (36.6 C)   Pulse 48  47 year old female in no acute distress.   HEENT:  Nares- clear.  Oropharynx - without lesions. NECK:  Supple.  Nontender.  No audible bruit.  HEART:  Appears to be regular. LUNGS:  No crackles or wheezing audible.  Respirations even and unlabored.  RADIAL PULSE:  Equal bilaterally.    BREASTS:  No nipple discharge or nipple retraction present.  Could not appreciate any distinct nodules or axillary adenopathy.  ABDOMEN:  Soft, nontender.  Bowel sounds present and normal.  No audible abdominal bruit.  GU:  Performed by gyn.     EXTREMITIES:  No increased edema present.  DP pulses palpable and equal bilaterally.        Assessment & Plan:  GYN.  S/P hysterectomy.  Found to have a left ovarian cyst.  Due follow up with gyn 12/08/12.  Pain is better.    CARDIOVASCULAR.  Currently asymptomatic.    ELEVATED BLOOD PRESSURE.  Blood pressure doing well on no meds.  Follow.    PREVIOUS ABNORMAL MAMMOGRAM.  Mammogram 10/12/10 rec follow up views.  These were performed 10/15/10 - revealed what was read as a benign lymph node - Birads II.  Saw Dr Lemar Livings.  Getting regular mammograms in [place].  Mammogram 11/16/11 - BiRADS I.  Recommended follow up mammogram in one year.  Scheduled.    MSK.  Persistent left hip pain.  Unable to take antiinflammatories.  Will refer to ortho for evaluation and  possible injection.  Has been seen at [facility]    HEALTH MAINTENANCE.  Physical today.  Pelvics through GYN (Dr Cherly Hensen).  Colonoscopy 10/12/10 with polyps removed.  Continues to follow with GI.

## 2012-11-18 NOTE — Assessment & Plan Note (Signed)
Has known Barretts.  EGD 11/12/10 - bx c/w reflux gastroesophagitis.  Continue Dexilant.  Doing better.  Follow.   

## 2012-11-21 ENCOUNTER — Ambulatory Visit
Admission: RE | Admit: 2012-11-21 | Discharge: 2012-11-21 | Disposition: A | Discharge status: Home / Self Care | Payer: 59 | Source: Ambulatory Visit

## 2012-11-21 DIAGNOSIS — Z1231 Encounter for screening mammogram for malignant neoplasm of breast: Secondary | ICD-10-CM

## 2012-11-22 LAB — HM MAMMOGRAPHY

## 2012-11-23 ENCOUNTER — Encounter: Payer: Self-pay | Admitting: Internal Medicine

## 2013-01-01 ENCOUNTER — Other Ambulatory Visit: Payer: Self-pay | Admitting: Internal Medicine

## 2013-01-01 MED ORDER — DEXLANSOPRAZOLE 60 MG PO CPDR: 60.0000 mg | DELAYED_RELEASE_CAPSULE | Freq: Every day | ORAL | Status: DC

## 2013-01-01 NOTE — Telephone Encounter (Signed)
Sent through [employer]

## 2013-01-01 NOTE — Telephone Encounter (Signed)
dexlansoprazole (DEXILANT) 60 MG capsule

## 2013-01-05 ENCOUNTER — Telehealth: Payer: Self-pay | Admitting: Internal Medicine

## 2013-01-05 MED ORDER — ACYCLOVIR 400 MG PO TABS: ORAL_TABLET | ORAL | Status: DC

## 2013-01-05 NOTE — Telephone Encounter (Signed)
Refilled.  I sent in to pharmacy

## 2013-01-05 NOTE — Telephone Encounter (Signed)
Okay to refill? 

## 2013-01-05 NOTE — Telephone Encounter (Signed)
Acyclovir refill needed.  States she thought Dr. Lorin Picket refilled this at her last appt.  Pharmacy told her to contact her doctor for the refill.  Pharmacy:  [facility] [place].

## 2013-02-27 ENCOUNTER — Ambulatory Visit: Payer: Self-pay | Admitting: Family Medicine

## 2013-02-27 LAB — RAPID STREP-A WITH REFLX: Micro Text Report: NEGATIVE

## 2013-03-01 LAB — BETA STREP CULTURE(ARMC)

## 2013-03-26 ENCOUNTER — Other Ambulatory Visit: Payer: Self-pay | Admitting: Internal Medicine

## 2013-05-26 ENCOUNTER — Ambulatory Visit: Payer: Self-pay | Admitting: Internal Medicine

## 2013-05-30 ENCOUNTER — Other Ambulatory Visit: Payer: Self-pay | Admitting: Internal Medicine

## 2013-05-30 NOTE — Telephone Encounter (Signed)
Ok refill? Last visit 11/16/12

## 2013-05-30 NOTE — Telephone Encounter (Signed)
I refilled acyclovir x 1, but needs a f/u appt scheduled.  (64min appt).

## 2013-06-18 ENCOUNTER — Ambulatory Visit (INDEPENDENT_AMBULATORY_CARE_PROVIDER_SITE_OTHER): Payer: 59 | Admitting: Adult Health

## 2013-06-18 ENCOUNTER — Encounter: Payer: Self-pay | Admitting: Adult Health

## 2013-06-18 VITALS — BP 118/70 | HR 83 | Temp 98.2°F | Resp 16 | Wt 195.5 lb

## 2013-06-18 DIAGNOSIS — N63 Unspecified lump in unspecified breast: Secondary | ICD-10-CM

## 2013-06-18 NOTE — Patient Instructions (Addendum)
  I am referring you for a diagnostic mammogram.  We will call you with an appointment tomorrow.  If you have not heard from [place] by 11:30 AM please call the office and ask to speak to [employer]  Try an over the counter allergy medication such as Allegra, Zyrtec or Claritin. These are once a day medication.

## 2013-06-18 NOTE — Progress Notes (Signed)
Patient ID: AARON BOEH, female   DOB: Nov 11, 1965, 48 y.o.   MRN: [id number]   Subjective:    Patient ID: MAEGAN BULLER, female    DOB: 06/17/1965, 48 y.o.   MRN: [id number]  Cough    Pt is a pleasant 48 y/o female who presents to clinic with concerns about lump in her left breast. Last mammogram was in July 2014 and was normal. She reports having fibrocystic breast tissue. She palpated the lump while performing self breast exam a couple of days ago. Denies nipple discharge, inverted nipple or skin changes. She has some tenderness in the left breast.  Past Medical History  Diagnosis Date  . Arthritis   . Hyperlipidemia     ?  Marland Kitchen Colon polyps   . HSV infection     History  . GERD (gastroesophageal reflux disease) 10/12/10    EGD, positive H. pylori  . Barrett's esophagus   . Anemia   . Post-operative nausea and vomiting     Current Outpatient Prescriptions on File Prior to Visit  Medication Sig Dispense Refill  . acyclovir (ZOVIRAX) 400 MG tablet TAKE 1 TABLET BY MOUTH TWICE A DAY  60 tablet  0  . Calcium Carbonate-Vitamin D (CALCIUM 600+D3) 600-400 MG-UNIT per tablet Take 1 tablet by mouth daily.      . Cholecalciferol (VITAMIN D PO) Take 2 tablets by mouth daily. Gummy formulation      . dexlansoprazole (DEXILANT) 60 MG capsule Take 1 capsule (60 mg total) by mouth daily.  30 capsule  5  . diphenhydrAMINE (BENADRYL) 25 mg capsule Take 25 mg by mouth at bedtime as needed. For allergies      . ferrous sulfate 325 (65 FE) MG tablet Take 325 mg by mouth daily.       . ranitidine (ZANTAC) 150 MG tablet Take 300 mg by mouth daily.      . mometasone (NASONEX) 50 MCG/ACT nasal spray Place 2 sprays into the nose daily.  17 g  1   No current facility-administered medications on file prior to visit.     Review of Systems  Respiratory: Positive for cough.   All other systems reviewed and are negative.  Breast: Lump on left breast. No nipple discharge, inverted nipple or skin  changes     Objective:  BP 118/70  Pulse 83  Temp(Src) 98.2 F (36.8 C) (Oral)  Resp 16  Wt 195 lb 8 oz (88.678 kg)  SpO2 99%  LMP 10/06/2011   Physical Exam  Constitutional: She appears well-developed and well-nourished. No distress.  Pulmonary/Chest: Effort normal and breath sounds normal. No respiratory distress. She has no wheezes. She has no rales. Right breast exhibits mass and tenderness. Right breast exhibits no inverted nipple, no nipple discharge and no skin change. Left breast exhibits mass and tenderness. Left breast exhibits no inverted nipple, no nipple discharge and no skin change. Breasts are symmetrical.         Assessment & Plan:   1. Breast lump Pt with lump on left breast at ~ 6 o'clock and on the right breast at ~ 4-5 o'clock. Send for diagnostic mammogram.  - MM Digital Diagnostic Bilat; Future

## 2013-06-18 NOTE — Progress Notes (Signed)
Pre visit review using our clinic review tool, if applicable. No additional management support is needed unless otherwise documented below in the visit note. 

## 2013-06-19 ENCOUNTER — Other Ambulatory Visit: Payer: Self-pay | Admitting: *Deleted

## 2013-06-19 DIAGNOSIS — N6310 Unspecified lump in the right breast, unspecified quadrant: Secondary | ICD-10-CM

## 2013-06-19 DIAGNOSIS — N6320 Unspecified lump in the left breast, unspecified quadrant: Secondary | ICD-10-CM

## 2013-06-25 ENCOUNTER — Other Ambulatory Visit: Payer: Self-pay | Admitting: Internal Medicine

## 2013-06-28 ENCOUNTER — Ambulatory Visit
Admission: RE | Admit: 2013-06-28 | Discharge: 2013-06-28 | Disposition: A | Discharge status: Home / Self Care | Payer: 59 | Source: Ambulatory Visit | Attending: Adult Health | Admitting: Adult Health

## 2013-06-28 DIAGNOSIS — N63 Unspecified lump in unspecified breast: Secondary | ICD-10-CM

## 2013-06-28 DIAGNOSIS — N6310 Unspecified lump in the right breast, unspecified quadrant: Secondary | ICD-10-CM

## 2013-06-28 DIAGNOSIS — N6320 Unspecified lump in the left breast, unspecified quadrant: Secondary | ICD-10-CM

## 2013-07-26 NOTE — Telephone Encounter (Signed)
Made appoiintment for 6/26 @ 3  Left message for pt to call office

## 2013-09-28 ENCOUNTER — Ambulatory Visit (INDEPENDENT_AMBULATORY_CARE_PROVIDER_SITE_OTHER): Payer: 59 | Admitting: Internal Medicine

## 2013-09-28 ENCOUNTER — Encounter: Payer: Self-pay | Admitting: Internal Medicine

## 2013-09-28 VITALS — BP 118/80 | HR 83 | Temp 99.0°F | Ht 65.5 in | Wt 178.2 lb

## 2013-09-28 DIAGNOSIS — D72819 Decreased white blood cell count, unspecified: Secondary | ICD-10-CM

## 2013-09-28 DIAGNOSIS — Z9109 Other allergy status, other than to drugs and biological substances: Secondary | ICD-10-CM

## 2013-09-28 DIAGNOSIS — K2270 Barrett's esophagus without dysplasia: Secondary | ICD-10-CM

## 2013-09-28 DIAGNOSIS — K2100 Gastro-esophageal reflux disease with esophagitis, without bleeding: Secondary | ICD-10-CM

## 2013-09-28 DIAGNOSIS — E049 Nontoxic goiter, unspecified: Secondary | ICD-10-CM

## 2013-09-28 DIAGNOSIS — N644 Mastodynia: Secondary | ICD-10-CM

## 2013-09-28 DIAGNOSIS — E7800 Pure hypercholesterolemia, unspecified: Secondary | ICD-10-CM

## 2013-09-28 NOTE — Progress Notes (Signed)
Pre visit review using our clinic review tool, if applicable. No additional management support is needed unless otherwise documented below in the visit note. 

## 2013-09-29 ENCOUNTER — Encounter: Payer: Self-pay | Admitting: Internal Medicine

## 2013-09-29 DIAGNOSIS — N644 Mastodynia: Secondary | ICD-10-CM | POA: Insufficient documentation

## 2013-09-29 NOTE — Assessment & Plan Note (Signed)
Low cholesterol diet and exercise.  Follow.  Follow lipid panel.

## 2013-09-29 NOTE — Assessment & Plan Note (Signed)
Has known Barretts.  EGD 11/12/10 - bx c/w reflux gastroesophagitis.  Continue Dexilant.  Doing better.  Follow.

## 2013-09-29 NOTE — Progress Notes (Signed)
Subjective:    Patient ID: Kelly Massey, female    DOB: [birth year], 48 y.o.   MRN: [id number]  HPI 48 year old female with past history of hypercholesterolemia and GERD (with Barretts) who comes in today for a scheduled follow up.   She recently had a GI evaluation.  Had EGD and esophageal pH probe.  She is on Dexilant.  Having no burning now.  Acid reflux better.  Last visit, she had some increased left lower quadrant pain.  Had CT that revealed left ovarian cyst.  Saw gyn.  Has been doing well.  Did notice a similar pain last week.  This has resolved now.  Pain is better.  Eating and drinking well.  No dysuria.  No increased urinary frequency.  No vaginal symptoms.  Has done well since her hysterectomy.  Eating and drinking well.  She saw Raquel recently for breast tenderness.  See her note for details.  Had bilateral diagnostic mammogram and ultrasound.  Mammogram and ultrasound - ok.  She is taking vitamin E now.  Does not ingest increased amount of caffeine or chocolate.  Overall she feels she is doing well.     Past Medical History  Diagnosis Date  . Arthritis   . Hyperlipidemia     ?  Marland Kitchen Colon polyps   . HSV infection     History  . GERD (gastroesophageal reflux disease) 10/12/10    EGD, positive H. pylori  . Barrett's esophagus   . Anemia   . Post-operative nausea and vomiting     Current Outpatient Prescriptions on File Prior to Visit  Medication Sig Dispense Refill  . acyclovir (ZOVIRAX) 400 MG tablet TAKE 1 TABLET BY MOUTH TWICE A DAY  60 tablet  2  . Calcium Carbonate-Vitamin D (CALCIUM 600+D3) 600-400 MG-UNIT per tablet Take 1 tablet by mouth daily.      . Cholecalciferol (VITAMIN D PO) Take 2 tablets by mouth daily. Gummy formulation      . DEXILANT 60 MG capsule TAKE 1 CAPSULE (60 MG TOTAL) BY MOUTH DAILY.  30 capsule  5  . diphenhydrAMINE (BENADRYL) 25 mg capsule Take 25 mg by mouth at bedtime as needed. For allergies      . ferrous sulfate 325 (65 FE) MG tablet Take 325  mg by mouth daily.       . mometasone (NASONEX) 50 MCG/ACT nasal spray Place 2 sprays into the nose daily.  17 g  1  . ranitidine (ZANTAC) 150 MG tablet Take 300 mg by mouth daily.       No current facility-administered medications on file prior to visit.    Review of Systems Patient denies any headache, lightheadedness or dizziness.  No significant sinus or allergy symptoms.  No chest pain, tightness or palpitations.  No increased shortness of breath.  No cough or congestion.  No nausea or vomiting.  No significant abdominal pain or cramping currently.  The previous left lower quadrant pain resolved.   No bowel change, such as diarrhea, BRBPR or melana.  Bowels stable.  No vaginal symptoms.  Still with increased intermittent breast tenderness.  No palpable lumps.  No constant pain.         Objective:   Physical Exam  Filed Vitals:   09/28/13 1514  BP: 118/80  Pulse: 83  Temp: 99 F (25.80 C)   48 year old female in no acute distress.   HEENT:  Nares- clear.  Oropharynx - without lesions. NECK:  Supple.  Nontender.  No audible bruit.  HEART:  Appears to be regular. LUNGS:  No crackles or wheezing audible.  Respirations even and unlabored.  RADIAL PULSE:  Equal bilaterally.    BREASTS:  No nipple discharge or nipple retraction present.  Could not appreciate any distinct nodules or axillary adenopathy.  ABDOMEN:  Soft, nontender.  Bowel sounds present and normal.  No audible abdominal bruit.     EXTREMITIES:  No increased edema present.  DP pulses palpable and equal bilaterally.        Assessment & Plan:  GYN.  S/P hysterectomy.  Found to have a left ovarian cyst previously.  No pain now.    CARDIOVASCULAR.  Currently asymptomatic.    ELEVATED BLOOD PRESSURE.  Blood pressure doing well on no meds.  Follow.    PREVIOUS ABNORMAL MAMMOGRAM.  Mammogram 10/12/10 rec follow up views.  These were performed 10/15/10 - revealed what was read as a benign lymph node - Birads II.  Saw Dr  Bary Castilla.  Getting regular mammograms in [place].  Mammogram 11/16/11 - BiRADS I.  Just had mammogram (diagnostic) 3/16 /15 - Birads II.     HEALTH MAINTENANCE.  Physical 11/16/12.  Pelvics through GYN (Dr Garwin Brothers).  Colonoscopy 10/12/10 with polyps removed.  Continues to follow with GI.   Mammogram as outlined.

## 2013-09-29 NOTE — Assessment & Plan Note (Signed)
Previous ultrasound revealed no nodules.  Follow TSH.

## 2013-09-29 NOTE — Assessment & Plan Note (Signed)
Has a history of leukopenia and neutrapenia.  HIV and ANA checked in the past and negative.  TSH, B12, folate and SIEP all negative.  She was taken off Zegrid and Aciphex secondary to the leukopenia.  On Dexilant.  Follow cbc.

## 2013-09-29 NOTE — Assessment & Plan Note (Signed)
Breast exam as outlined.  No palpable nodule.  Pt cannot palpate any abnormality.  Diagnostic mammogram 3/15 - Birads II.  Doing well.  Follow.  Notify me if any change.

## 2013-09-29 NOTE — Assessment & Plan Note (Addendum)
Just had follow up EGD.  See report for details.  On Dexilant.  Symptoms controlled now.  Follow.  Continue to f/u with GI.

## 2013-09-29 NOTE — Assessment & Plan Note (Signed)
Receiving injections now.  Follow.  

## 2013-10-12 ENCOUNTER — Encounter: Payer: Self-pay | Admitting: *Deleted

## 2013-10-12 ENCOUNTER — Other Ambulatory Visit (INDEPENDENT_AMBULATORY_CARE_PROVIDER_SITE_OTHER): Payer: 59

## 2013-10-12 DIAGNOSIS — R5383 Other fatigue: Secondary | ICD-10-CM

## 2013-10-12 DIAGNOSIS — E7800 Pure hypercholesterolemia, unspecified: Secondary | ICD-10-CM

## 2013-10-12 DIAGNOSIS — D72819 Decreased white blood cell count, unspecified: Secondary | ICD-10-CM

## 2013-10-12 DIAGNOSIS — E049 Nontoxic goiter, unspecified: Secondary | ICD-10-CM

## 2013-10-12 DIAGNOSIS — R5381 Other malaise: Secondary | ICD-10-CM

## 2013-10-12 LAB — LIPID PANEL
Cholesterol: 198 mg/dL (ref 0–200)
HDL: 78.1 mg/dL (ref >39.00)
LDL Cholesterol: 111 mg/dL — ABNORMAL HIGH (ref 0–99)
NonHDL: 119.9
Total CHOL/HDL Ratio: 3
Triglycerides: 43 mg/dL (ref 0.0–149.0)
VLDL: 8.6 mg/dL (ref 0.0–40.0)

## 2013-10-12 LAB — COMPREHENSIVE METABOLIC PANEL
ALT: 14 U/L (ref 0–35)
AST: 13 U/L (ref 0–37)
Albumin: 4.1 g/dL (ref 3.5–5.2)
Alkaline Phosphatase: 42 U/L (ref 39–117)
BUN: 6 mg/dL (ref 6–23)
CO2: 29 mEq/L (ref 19–32)
Calcium: 9.2 mg/dL (ref 8.4–10.5)
Chloride: 104 mEq/L (ref 96–112)
Creatinine, Ser: 0.7 mg/dL (ref 0.4–1.2)
GFR: 107.58 mL/min (ref >60.00)
Glucose, Bld: 90 mg/dL (ref 70–99)
Potassium: 4.2 mEq/L (ref 3.5–5.1)
Sodium: 138 mEq/L (ref 135–145)
Total Bilirubin: 0.7 mg/dL (ref 0.2–1.2)
Total Protein: 6.8 g/dL (ref 6.0–8.3)

## 2013-10-12 LAB — CBC WITH DIFFERENTIAL/PLATELET
Basophils Absolute: 0 10*3/uL (ref 0.0–0.1)
Basophils Relative: 0.6 % (ref 0.0–3.0)
Eosinophils Absolute: 0 10*3/uL (ref 0.0–0.7)
Eosinophils Relative: 0.6 % (ref 0.0–5.0)
HCT: 40.7 % (ref 36.0–46.0)
Hemoglobin: 13.7 g/dL (ref 12.0–15.0)
Lymphocytes Relative: 45.7 % (ref 12.0–46.0)
Lymphs Abs: 1.5 10*3/uL (ref 0.7–4.0)
MCHC: 33.7 g/dL (ref 30.0–36.0)
MCV: 92.3 fl (ref 78.0–100.0)
Monocytes Absolute: 0.4 10*3/uL (ref 0.1–1.0)
Monocytes Relative: 11.3 % (ref 3.0–12.0)
Neutro Abs: 1.4 10*3/uL (ref 1.4–7.7)
Neutrophils Relative %: 41.8 % — ABNORMAL LOW (ref 43.0–77.0)
Platelets: 224 10*3/uL (ref 150.0–400.0)
RBC: 4.41 Mil/uL (ref 3.87–5.11)
RDW: 13.8 % (ref 11.5–15.5)
WBC: 3.3 10*3/uL — ABNORMAL LOW (ref 4.0–10.5)

## 2013-10-12 LAB — TSH: TSH: 0.54 u[IU]/mL (ref 0.35–4.50)

## 2013-10-15 ENCOUNTER — Encounter: Payer: Self-pay | Admitting: *Deleted

## 2013-11-05 ENCOUNTER — Ambulatory Visit (INDEPENDENT_AMBULATORY_CARE_PROVIDER_SITE_OTHER): Payer: 59 | Admitting: Internal Medicine

## 2013-11-05 ENCOUNTER — Encounter: Payer: Self-pay | Admitting: Internal Medicine

## 2013-11-05 VITALS — BP 120/80 | HR 84 | Temp 98.3°F | Ht 66.0 in | Wt 177.2 lb

## 2013-11-05 DIAGNOSIS — D72819 Decreased white blood cell count, unspecified: Secondary | ICD-10-CM

## 2013-11-05 DIAGNOSIS — E7800 Pure hypercholesterolemia, unspecified: Secondary | ICD-10-CM

## 2013-11-05 DIAGNOSIS — R198 Other specified symptoms and signs involving the digestive system and abdomen: Secondary | ICD-10-CM

## 2013-11-05 DIAGNOSIS — K2270 Barrett's esophagus without dysplasia: Secondary | ICD-10-CM

## 2013-11-05 DIAGNOSIS — E049 Nontoxic goiter, unspecified: Secondary | ICD-10-CM

## 2013-11-05 DIAGNOSIS — Z9109 Other allergy status, other than to drugs and biological substances: Secondary | ICD-10-CM

## 2013-11-05 DIAGNOSIS — K2100 Gastro-esophageal reflux disease with esophagitis, without bleeding: Secondary | ICD-10-CM

## 2013-11-05 MED ORDER — MOMETASONE FUROATE 50 MCG/ACT NA SUSP: 2.0000 | Freq: Every day | NASAL | Status: DC

## 2013-11-05 NOTE — Progress Notes (Signed)
Pre visit review using our clinic review tool, if applicable. No additional management support is needed unless otherwise documented below in the visit note. 

## 2013-11-11 ENCOUNTER — Encounter: Payer: Self-pay | Admitting: Internal Medicine

## 2013-11-11 DIAGNOSIS — R198 Other specified symptoms and signs involving the digestive system and abdomen: Secondary | ICD-10-CM | POA: Insufficient documentation

## 2013-11-11 NOTE — Assessment & Plan Note (Signed)
Has known Barretts.  EGD 11/12/10 - bx c/w reflux gastroesophagitis.  Continue Dexilant.  Doing better.  Follow.

## 2013-11-11 NOTE — Assessment & Plan Note (Signed)
Low cholesterol diet and exercise.  Follow.  Follow lipid panel.  Cholesterol just checked 10/12/13 revealed total cholesterol 198, triglycerides 43, HDL 78 and LDL 111.

## 2013-11-11 NOTE — Assessment & Plan Note (Signed)
Just had follow up EGD.  See report for details.  On Dexilant in the am and zantac in the evening.  Symptoms better now.  Follow.  Continue to f/u with GI.

## 2013-11-11 NOTE — Progress Notes (Signed)
Subjective:    Patient ID: Kelly Massey, female    DOB: Apr 24, 1965, 48 y.o.   MRN: [id number]  HPI 48 year old female with past history of hypercholesterolemia and GERD (with Barretts) who comes in today to follow up on these issues as well as for a complete physical exam.  She recently had a GI evaluation.  Had EGD and esophageal pH probe.  She is on Dexilant.  Having no burning now.  Acid reflux better.  Occasionally may notice some mild symptoms, but overall improved.  Some change in her bowel movements.  Used to having more than one bowel movement per day.  Had only one bowel movement yesterday.  We discussed adding fiber.  Previous visit, she had some increased left lower quadrant pain.  Had CT that revealed left ovarian cyst.  Saw gyn.  Has been doing well.  No significant pain or problems since her last visit here.   Eating and drinking well.  No dysuria.  No increased urinary frequency.  No vaginal symptoms.  Has done well since her hysterectomy.   Overall she feels she is doing well.     Past Medical History  Diagnosis Date  . Arthritis   . Hyperlipidemia     ?  Marland Kitchen Colon polyps   . HSV infection     History  . GERD (gastroesophageal reflux disease) 10/12/10    EGD, positive H. pylori  . Barrett's esophagus   . Anemia   . Post-operative nausea and vomiting     Current Outpatient Prescriptions on File Prior to Visit  Medication Sig Dispense Refill  . acyclovir (ZOVIRAX) 400 MG tablet TAKE 1 TABLET BY MOUTH TWICE A DAY  60 tablet  2  . Calcium Carbonate-Vitamin D (CALCIUM 600+D3) 600-400 MG-UNIT per tablet Take 1 tablet by mouth daily.      . Cholecalciferol (VITAMIN D PO) Take 2 tablets by mouth daily. Gummy formulation      . DEXILANT 60 MG capsule TAKE 1 CAPSULE (60 MG TOTAL) BY MOUTH DAILY.  30 capsule  5  . diphenhydrAMINE (BENADRYL) 25 mg capsule Take 25 mg by mouth at bedtime as needed. For allergies      . ferrous sulfate 325 (65 FE) MG tablet Take 325 mg by mouth daily.        . ranitidine (ZANTAC) 150 MG tablet Take 300 mg by mouth daily.       No current facility-administered medications on file prior to visit.    Review of Systems Patient denies any headache, lightheadedness or dizziness.  No significant sinus or allergy symptoms.  No chest pain, tightness or palpitations.  No increased shortness of breath.  No cough or congestion.  No nausea or vomiting.  No significant abdominal pain or cramping currently.  The previous left lower quadrant pain resolved.   No BRBPR or melana.  Has noticed some decrease in the number of bowel movements.  See above.  Add fiber.   No vaginal symptoms.          Objective:   Physical Exam  Filed Vitals:   11/05/13 0838  BP: 120/80  Pulse: 84  Temp: 98.3 F (36.8 C)   Blood pressure recheck:  11/37  48 year old female in no acute distress.   HEENT:  Nares- clear.  Oropharynx - without lesions. NECK:  Supple.  Nontender.  No audible bruit.  HEART:  Appears to be regular. LUNGS:  No crackles or wheezing audible.  Respirations even and  unlabored.  RADIAL PULSE:  Equal bilaterally.    BREASTS:  No nipple discharge or nipple retraction present.  Could not appreciate any distinct nodules or axillary adenopathy.  ABDOMEN:  Soft, nontender.  Bowel sounds present and normal.  No audible abdominal bruit. GU:  Performed by gyn.       EXTREMITIES:  No increased edema present.  DP pulses palpable and equal bilaterally.        Assessment & Plan:  GYN.  S/P hysterectomy.  Found to have a left ovarian cyst previously.  No pain now.  Continue follow up with gyn.   CARDIOVASCULAR.  Currently asymptomatic.    ELEVATED BLOOD PRESSURE.  Blood pressure doing well on no meds.  Follow.    PREVIOUS ABNORMAL MAMMOGRAM.  Mammogram 10/12/10 rec follow up views.  These were performed 10/15/10 - revealed what was read as a benign lymph node - Birads II.  Saw Dr Bary Castilla.  Getting regular mammograms in [place].  Mammogram 11/16/11 - BiRADS I.  Just  had mammogram (diagnostic) 3/16 /15 - Birads II.     HEALTH MAINTENANCE.  Physical today.  Pelvics through GYN (Dr Garwin Brothers).  Colonoscopy 10/12/10 with polyps removed.  Continues to follow with GI.   Mammogram as outlined.    I spent 25 minutes with the patient and more than 50% of the time was spent in consultation regarding the above.

## 2013-11-11 NOTE — Assessment & Plan Note (Signed)
Previous ultrasound revealed no nodules.  Follow TSH.

## 2013-11-11 NOTE — Assessment & Plan Note (Signed)
Still having regular bowel movements, but less than her normal.  Add fiber.  Stay hydrated.   Follow.

## 2013-11-11 NOTE — Assessment & Plan Note (Signed)
Receiving injections now.  Follow.  No reported problems today.

## 2013-11-11 NOTE — Assessment & Plan Note (Signed)
Has a history of leukopenia and neutrapenia.  HIV and ANA checked in the past and negative.  TSH, B12, folate and SIEP all negative.  She was taken off Zegrid and Aciphex secondary to the leukopenia.  On Dexilant.  Follow cbc.  White blood cell count 3.3 (10/12/13) - stable.

## 2013-12-24 ENCOUNTER — Other Ambulatory Visit: Payer: Self-pay | Admitting: Internal Medicine

## 2013-12-25 ENCOUNTER — Encounter: Payer: Self-pay | Admitting: Family Medicine

## 2013-12-25 ENCOUNTER — Ambulatory Visit: Payer: Self-pay | Admitting: Family Medicine

## 2013-12-25 ENCOUNTER — Ambulatory Visit (INDEPENDENT_AMBULATORY_CARE_PROVIDER_SITE_OTHER): Payer: 59 | Admitting: Family Medicine

## 2013-12-25 VITALS — BP 124/82 | HR 74 | Temp 98.4°F | Ht 66.0 in | Wt 179.0 lb

## 2013-12-25 DIAGNOSIS — R102 Pelvic and perineal pain: Secondary | ICD-10-CM

## 2013-12-25 DIAGNOSIS — Z8742 Personal history of other diseases of the female genital tract: Secondary | ICD-10-CM

## 2013-12-25 DIAGNOSIS — N949 Unspecified condition associated with female genital organs and menstrual cycle: Secondary | ICD-10-CM

## 2013-12-25 DIAGNOSIS — R109 Unspecified abdominal pain: Secondary | ICD-10-CM

## 2013-12-25 LAB — POCT URINALYSIS DIPSTICK
Bilirubin, UA: NEGATIVE
Blood, UA: NEGATIVE
Glucose, UA: NEGATIVE
Ketones, UA: NEGATIVE
Leukocytes, UA: NEGATIVE
Nitrite, UA: NEGATIVE
Protein, UA: NEGATIVE
Spec Grav, UA: <=1.005
Urobilinogen, UA: 0.2
pH, UA: 7.5

## 2013-12-25 MED ORDER — TRAMADOL HCL 50 MG PO TABS: 50.0000 mg | ORAL_TABLET | Freq: Three times a day (TID) | ORAL | Status: DC | PRN

## 2013-12-25 NOTE — Progress Notes (Signed)
Subjective:    Patient ID: Kelly Massey, female    DOB: 1965-06-03, 48 y.o.   MRN: [id number]  HPI Here with pain over lower abdomen  Is a nagging pain  Started yesterday evening out of the blue  Quite intense for several hours -and it eased a bit to go to bed -worse again this am  Hurt to go up and down stairs this am   Had a hysterectomy over a year ago  Still has ovaries however    Last ovarian cyst was about a month ago -by symptoms  Did not have an ultrasound   No GI symptoms except mild nausea over the weekend  No stool changes or blood  Not on any hormones   Has had ovarian cysts in the past  She sees Dr Garwin Brothers in [place]   Has not used med or heat    Patient Active Problem List   Diagnosis Date Noted  . Pelvic pain in female 12/25/2013  . Change in bowel movement 11/11/2013  . breast tenderness 09/29/2013  . Dysphagia, unspecified(787.20) 06/12/2012  . Barrett's esophagus 06/12/2012  . Environmental allergies 02/13/2012  . Enlarged thyroid 02/13/2012  . GERD (gastroesophageal reflux disease) 02/08/2012  . Leukopenia 02/08/2012  . Fatigue 02/08/2012  . Hypercholesterolemia 02/08/2012   Past Medical History  Diagnosis Date  . Arthritis   . Hyperlipidemia     ?  Marland Kitchen Colon polyps   . HSV infection     History  . GERD (gastroesophageal reflux disease) 10/12/10    EGD, positive H. pylori  . Barrett's esophagus   . Anemia   . Post-operative nausea and vomiting    Past Surgical History  Procedure Laterality Date  . Shoulder arthroscopy Left   . Dilation and curettage of uterus      SAB  . Abdominal hysterectomy    . Ovarian cyst removal    . Esophagogastroduodenoscopy N/A 07/24/2012    Procedure: ESOPHAGOGASTRODUODENOSCOPY (EGD);  Surgeon: Inda Castle, MD;  Location: [facility];  Service: Endoscopy;  Laterality: N/A;  . Bravo ph study N/A 07/24/2012    Procedure: BRAVO [place] STUDY;  Surgeon: Inda Castle, MD;  Location: [facility];  Service:  Endoscopy;  Laterality: N/A;  . Balloon dilation N/A 07/24/2012    Procedure: BALLOON DILATION;  Surgeon: Inda Castle, MD;  Location: [facility];  Service: Endoscopy;  Laterality: N/A;   History  Substance Use Topics  . Smoking status: Never Smoker   . Smokeless tobacco: Never Used  . Alcohol Use: No   Family History  Problem Relation Age of Onset  . Arthritis Mother   . Breast cancer Maternal Aunt   . Stroke Mother   . Hypertension Mother   . Heart failure Mother   . Colon cancer Neg Hx   . Prostate cancer Maternal Uncle     x 2  . Diabetes Sister   . Diabetes Brother     x 2  . Diabetes Paternal Grandmother   . Hypertension Other   . Hyperlipidemia Other   . Diabetes Paternal Grandfather   . Lung cancer Father   . Diabetes Maternal Uncle   . Throat cancer Maternal Aunt   . Dementia Mother    Allergies  Allergen Reactions  . Adhesive [Tape] Other (See Comments)    Skin appeared "burned" after last surg.  . Aspirin Nausea And Vomiting  . Ibuprofen Nausea And Vomiting  . Penicillins Diarrhea  . Nsaids Other (See Comments)  GI pain  . Oxycodone Other (See Comments)  . Shrimp [Shellfish Allergy] Nausea And Vomiting   Current Outpatient Prescriptions on File Prior to Visit  Medication Sig Dispense Refill  . acyclovir (ZOVIRAX) 400 MG tablet TAKE 1 TABLET BY MOUTH TWICE A DAY  60 tablet  2  . Calcium Carbonate-Vitamin D (CALCIUM 600+D3) 600-400 MG-UNIT per tablet Take 1 tablet by mouth daily.      . Cholecalciferol (VITAMIN D PO) Take 2 tablets by mouth daily. Gummy formulation      . DEXILANT 60 MG capsule TAKE 1 CAPSULE (60 MG TOTAL) BY MOUTH DAILY.  30 capsule  5  . diphenhydrAMINE (BENADRYL) 25 mg capsule Take 25 mg by mouth at bedtime as needed. For allergies      . ferrous sulfate 325 (65 FE) MG tablet Take 325 mg by mouth daily.       . mometasone (NASONEX) 50 MCG/ACT nasal spray Place 2 sprays into the nose daily.  51 g  3  . ranitidine (ZANTAC) 150 MG  tablet Take 300 mg by mouth daily.       No current facility-administered medications on file prior to visit.    Review of Systems    Review of Systems  Constitutional: Negative for fever, appetite change, fatigue and unexpected weight change.  Eyes: Negative for pain and visual disturbance.  Respiratory: Negative for cough and shortness of breath.   Cardiovascular: Negative for cp or palpitations    Gastrointestinal: Negative for nausea, diarrhea and constipation.  Genitourinary: Negative for urgency and frequency. neg for vaginal d/c or odor or exp to std  Skin: Negative for pallor or rash   Neurological: Negative for weakness, light-headedness, numbness and headaches.  Hematological: Negative for adenopathy. Does not bruise/bleed easily.  Psychiatric/Behavioral: Negative for dysphoric mood. The patient is not nervous/anxious.      Objective:   Physical Exam  Constitutional: She appears well-developed and well-nourished. No distress.  HENT:  Head: Normocephalic and atraumatic.  Mouth/Throat: Oropharynx is clear and moist.  Eyes: Conjunctivae and EOM are normal. Pupils are equal, round, and reactive to light. No scleral icterus.  Neck: Normal range of motion. Neck supple.  Cardiovascular: Normal rate, regular rhythm and normal heart sounds.   Pulmonary/Chest: Effort normal and breath sounds normal. She has no rales. She exhibits no tenderness.  Abdominal: Soft. Bowel sounds are normal. She exhibits no distension and no mass. There is no hepatosplenomegaly. There is tenderness in the suprapubic area. There is no rebound, no guarding and no CVA tenderness.  Tenderness worse low/pelvic on left - mild with no rebound or guarding or mass   Musculoskeletal: She exhibits no edema.  Lymphadenopathy:    She has no cervical adenopathy.  Neurological: She is alert.  Skin: Skin is warm and dry. No rash noted. No pallor.  Psychiatric: She has a normal mood and affect.            Assessment & Plan:   Problem List Items Addressed This Visit     Other   Pelvic pain in female     Intermittent -per pt due to ovarian cysts  Has had a partial hysterectomy in the past with Dr Garwin Brothers and is likely perimenopausal  Will order pelvic US  She can tolerate limited ibuprofen if taken with meals - will take 400 tid with meal prn  Also given px for tramadol if needed for more severe pain -pt states she will not likely need it  Reassuring exam Nl  ua  Will update with result     Relevant Orders      US Pelvis Complete      US Transvaginal Non-OB    Other Visit Diagnoses   Abdominal pain, unspecified site    -  Primary    Relevant Orders       Urinalysis Dipstick (Completed)

## 2013-12-25 NOTE — Patient Instructions (Signed)
Ibuprofen 400 mg over the counter with a meal up to three times per day if needed  If pain is more severe you can try the tramadol  Stop at check out to refer for a pelvic ultrasound  If symptoms worsen please let us know

## 2013-12-25 NOTE — Assessment & Plan Note (Signed)
Intermittent -per pt due to ovarian cysts  Has had a partial hysterectomy in the past with Dr Garwin Brothers and is likely perimenopausal  Will order pelvic US  She can tolerate limited ibuprofen if taken with meals - will take 400 tid with meal prn  Also given px for tramadol if needed for more severe pain -pt states she will not likely need it  Reassuring exam Nl ua  Will update with result

## 2013-12-25 NOTE — Progress Notes (Signed)
Pre visit review using our clinic review tool, if applicable. No additional management support is needed unless otherwise documented below in the visit note. 

## 2013-12-26 ENCOUNTER — Telehealth: Payer: Self-pay | Admitting: Internal Medicine

## 2013-12-26 ENCOUNTER — Encounter: Payer: Self-pay | Admitting: Family Medicine

## 2013-12-26 NOTE — Telephone Encounter (Signed)
Radiology called and reported pelvic ultrasound negative.  Spoke to pt.  She is comfortable going home.  Will call me tonight if any worsening problems.  Will be evaluated if any acute change.  Keep [place] posted.

## 2014-01-01 ENCOUNTER — Encounter: Payer: 59 | Admitting: Internal Medicine

## 2014-01-10 ENCOUNTER — Telehealth: Payer: Self-pay | Admitting: Internal Medicine

## 2014-01-10 NOTE — Telephone Encounter (Signed)
Noted.  Let us know if needs anything.

## 2014-01-10 NOTE — Telephone Encounter (Signed)
FYI

## 2014-01-10 NOTE — Telephone Encounter (Signed)
Pt called to state that she is have abd. Pain again. Pt has appt with her GYN tomorrow. Pt will call after appt to let her know findings.msn

## 2014-02-01 ENCOUNTER — Other Ambulatory Visit: Payer: Self-pay | Admitting: Obstetrics and Gynecology

## 2014-02-01 ENCOUNTER — Ambulatory Visit: Payer: 59 | Admitting: Nurse Practitioner

## 2014-02-04 ENCOUNTER — Encounter (HOSPITAL_COMMUNITY): Payer: Self-pay | Admitting: *Deleted

## 2014-02-07 ENCOUNTER — Encounter: Payer: Self-pay | Admitting: Internal Medicine

## 2014-02-07 ENCOUNTER — Telehealth: Payer: Self-pay | Admitting: Internal Medicine

## 2014-02-07 ENCOUNTER — Ambulatory Visit (HOSPITAL_COMMUNITY): Payer: 59 | Admitting: Anesthesiology

## 2014-02-07 ENCOUNTER — Encounter (HOSPITAL_COMMUNITY)
Admission: RE | Discharge status: Absent without leave | Payer: Self-pay | Source: Ambulatory Visit | Attending: Obstetrics and Gynecology

## 2014-02-07 ENCOUNTER — Ambulatory Visit (HOSPITAL_COMMUNITY)
Admission: RE | Admit: 2014-02-07 | Discharge: 2014-02-07 | Discharge status: Against Medical Advice | Payer: 59 | Source: Ambulatory Visit | Attending: Obstetrics and Gynecology | Admitting: Obstetrics and Gynecology

## 2014-02-07 ENCOUNTER — Ambulatory Visit (INDEPENDENT_AMBULATORY_CARE_PROVIDER_SITE_OTHER): Payer: 59 | Admitting: Internal Medicine

## 2014-02-07 ENCOUNTER — Encounter: Payer: Self-pay | Admitting: *Deleted

## 2014-02-07 VITALS — BP 120/80 | HR 78 | Temp 98.4°F | Ht 66.0 in | Wt 176.2 lb

## 2014-02-07 DIAGNOSIS — K2100 Gastro-esophageal reflux disease with esophagitis, without bleeding: Secondary | ICD-10-CM

## 2014-02-07 DIAGNOSIS — Z8742 Personal history of other diseases of the female genital tract: Secondary | ICD-10-CM

## 2014-02-07 DIAGNOSIS — J069 Acute upper respiratory infection, unspecified: Secondary | ICD-10-CM

## 2014-02-07 SURGERY — ROBOTIC ASSISTED LAPAROSCOPIC OVARIAN CYSTECTOMY
Anesthesia: General | Laterality: Left

## 2014-02-07 MED ORDER — AZITHROMYCIN 250 MG PO TABS: ORAL_TABLET | ORAL | Status: DC

## 2014-02-07 SURGICAL SUPPLY — 56 items
BARRIER ADHS 3X4 INTERCEED (GAUZE/BANDAGES/DRESSINGS) IMPLANT
BRR ADH 4X3 ABS CNTRL BYND (GAUZE/BANDAGES/DRESSINGS)
CATH FOLEY 3WAY  5CC 16FR (CATHETERS)
CATH FOLEY 3WAY 5CC 16FR (CATHETERS) IMPLANT
CHLORAPREP W/TINT 26ML (MISCELLANEOUS) IMPLANT
CONT PATH 16OZ SNAP LID 3702 (MISCELLANEOUS) IMPLANT
COVER BACK TABLE 60X90IN (DRAPES) IMPLANT
COVER TIP SHEARS 8 DVNC (MISCELLANEOUS) IMPLANT
COVER TIP SHEARS 8MM DA VINCI (MISCELLANEOUS)
DECANTER SPIKE VIAL GLASS SM (MISCELLANEOUS) IMPLANT
DEVICE TROCAR PUNCTURE CLOSURE (ENDOMECHANICALS) IMPLANT
DRAPE HUG U DISPOSABLE (DRAPE) IMPLANT
DRAPE WARM FLUID 44X44 (DRAPE) IMPLANT
DRSG COVADERM PLUS 2X2 (GAUZE/BANDAGES/DRESSINGS) IMPLANT
DRSG OPSITE POSTOP 3X4 (GAUZE/BANDAGES/DRESSINGS) IMPLANT
ELECT REM PT RETURN 9FT ADLT (ELECTROSURGICAL)
ELECTRODE REM PT RTRN 9FT ADLT (ELECTROSURGICAL) IMPLANT
EVACUATOR SMOKE 8.L (FILTER) IMPLANT
GAUZE VASELINE 3X9 (GAUZE/BANDAGES/DRESSINGS) IMPLANT
GLOVE BIOGEL PI IND STRL 7.0 (GLOVE) IMPLANT
GLOVE BIOGEL PI INDICATOR 7.0 (GLOVE)
GLOVE ECLIPSE 6.5 STRL STRAW (GLOVE) IMPLANT
GOWN STRL REUS W/TWL LRG LVL3 (GOWN DISPOSABLE) IMPLANT
KIT ACCESSORY DA VINCI DISP (KITS)
KIT ACCESSORY DVNC DISP (KITS) IMPLANT
LEGGING LITHOTOMY PAIR STRL (DRAPES) IMPLANT
LIQUID BAND (GAUZE/BANDAGES/DRESSINGS) IMPLANT
NEEDLE INSUFFLATION 120MM (ENDOMECHANICALS) IMPLANT
NS IRRIG 1000ML POUR BTL (IV SOLUTION) IMPLANT
OCCLUDER COLPOPNEUMO (BALLOONS) IMPLANT
PACK ROBOT WH (CUSTOM PROCEDURE TRAY) IMPLANT
PAD PREP 24X48 CUFFED NSTRL (MISCELLANEOUS) IMPLANT
PAD TRENDELENBURG POSITION (MISCELLANEOUS) IMPLANT
PROTECTOR NERVE ULNAR (MISCELLANEOUS) IMPLANT
SCISSORS LAP 5X35 DISP (ENDOMECHANICALS) IMPLANT
SET CYSTO W/LG BORE CLAMP LF (SET/KITS/TRAYS/PACK) IMPLANT
SET IRRIG TUBING LAPAROSCOPIC (IRRIGATION / IRRIGATOR) IMPLANT
SUT VIC AB 0 CT1 27 (SUTURE)
SUT VIC AB 0 CT1 27XBRD ANTBC (SUTURE) IMPLANT
SUT VICRYL 0 UR6 27IN ABS (SUTURE) IMPLANT
SUT VICRYL 4-0 PS2 18IN ABS (SUTURE) IMPLANT
SYR 50ML LL SCALE MARK (SYRINGE) IMPLANT
SYSTEM CONVERTIBLE TROCAR (TROCAR) IMPLANT
TIP UTERINE 5.1X6CM LAV DISP (MISCELLANEOUS) IMPLANT
TIP UTERINE 6.7X10CM GRN DISP (MISCELLANEOUS) IMPLANT
TIP UTERINE 6.7X6CM WHT DISP (MISCELLANEOUS) IMPLANT
TIP UTERINE 6.7X8CM BLUE DISP (MISCELLANEOUS) IMPLANT
TOWEL OR 17X24 6PK STRL BLUE (TOWEL DISPOSABLE) IMPLANT
TRAY FOLEY BAG SILVER LF 14FR (CATHETERS) IMPLANT
TROCAR 12M 150ML BLUNT (TROCAR) IMPLANT
TROCAR DISP BLADELESS 8 DVNC (TROCAR) IMPLANT
TROCAR DISP BLADELESS 8MM (TROCAR)
TROCAR OPTI TIP 12M 100M (ENDOMECHANICALS) IMPLANT
TROCAR XCEL NON-BLD 5MMX100MML (ENDOMECHANICALS) IMPLANT
TROCAR Z-THREAD 12X150 (TROCAR) IMPLANT
WARMER LAPAROSCOPE (MISCELLANEOUS) IMPLANT

## 2014-02-07 NOTE — Telephone Encounter (Signed)
Appt scheduled today at 10:15, pt aware

## 2014-02-07 NOTE — Telephone Encounter (Signed)
Kelly Massey needs a letter for work to let them know she was here today and why she was out. Will you please print one for her? She's here in the office. If you need her to come back please let me know. Thank you.

## 2014-02-07 NOTE — Patient Instructions (Signed)
Take the antibiotic as directed.   Saline nasal spray - flush nose at least 2-3x/day.   Nasacort - 2 sprays each nostril one time per day.  Do this in the evening.    Robitussin DM as directed.

## 2014-02-07 NOTE — Telephone Encounter (Signed)
Note was given to patient while in lobby

## 2014-02-07 NOTE — Telephone Encounter (Signed)
Ms. Ju called saying she was supposed to have surgery today but couldn't due to having the following symptoms: sore throat, losing her voice, and coughing up yellow mucus x's 3 days. Her surgeon wants her to try to get in to see her doctor today or asap so the surgery can be rescheduled. I didn't see anything on Dr. Bary Leriche schedule today. Please call the patient. Pt ph# [phone] Thank you.

## 2014-02-10 DIAGNOSIS — J069 Acute upper respiratory infection, unspecified: Secondary | ICD-10-CM | POA: Insufficient documentation

## 2014-02-10 NOTE — Progress Notes (Signed)
Subjective:    Patient ID: Kelly Massey, female    DOB: 10-Nov-1965, 48 y.o.   MRN: [id number]  Cough  Sore Throat  Associated symptoms include coughing.  48 year old female with past history of hypercholesterolemia and GERD (with Barretts) who comes in today as a work in with concerns regarding sore throat, nasal congestion and chest congestion.  Symptoms started several days ago.  Was scheduled to have surgery this morning.  Was postponed until infection could be cleared.  Started with sore throat.  Headache.  No fever.  Runny nose.  Chest congestion.  Increased cough.  Left earache.  Blood tinged sputum.  Feels from drainage down.  No gross hemoptysis.  No sob.  No chest tightness.      Past Medical History  Diagnosis Date  . Arthritis   . Hyperlipidemia     ?  Marland Kitchen Colon polyps   . HSV infection     History  . GERD (gastroesophageal reflux disease) 10/12/10    EGD, positive H. pylori  . Barrett's esophagus   . Anemia   . Post-operative nausea and vomiting     Current Outpatient Prescriptions on File Prior to Visit  Medication Sig Dispense Refill  . acyclovir (ZOVIRAX) 400 MG tablet TAKE 1 TABLET BY MOUTH TWICE A DAY 60 tablet 2  . Calcium Carbonate-Vitamin D (CALCIUM 600+D3) 600-400 MG-UNIT per tablet Take 1 tablet by mouth daily.    . Cholecalciferol (VITAMIN D PO) Take 2,000 Int'l Units by mouth daily. Gummy formulation    . DEXILANT 60 MG capsule TAKE 1 CAPSULE (60 MG TOTAL) BY MOUTH DAILY. 30 capsule 5  . diphenhydrAMINE (BENADRYL) 25 mg capsule Take 12.5 mg by mouth at bedtime as needed. For allergies    . ferrous sulfate 325 (65 FE) MG tablet Take 325 mg by mouth daily.     . mometasone (NASONEX) 50 MCG/ACT nasal spray Place 2 sprays into the nose daily. (Patient taking differently: Place 2 sprays into the nose daily as needed. ) 51 g 3  . ranitidine (ZANTAC) 150 MG tablet Take 300 mg by mouth daily as needed for heartburn.     . traMADol (ULTRAM) 50 MG tablet Take 1 tablet  (50 mg total) by mouth every 8 (eight) hours as needed for moderate pain (with food). 20 tablet 0  . vitamin E 400 UNIT capsule Take 400 Units by mouth daily.     No current facility-administered medications on file prior to visit.    Review of Systems  Respiratory: Positive for cough.   headache and runny nose as outlined.  Sore throat.  Increased chest congestion.  No chest pain, tightness or palpitations.  No increased shortness of breath.   No nausea or vomiting.  No diarrhea.  Left earache.           Objective:   Physical Exam  Filed Vitals:   02/07/14 1019  BP: 120/80  Pulse: 78  Temp: 98.4 F (19.88 C)   48 year old female in no acute distress.   HEENT:  Nares- slightly erythematous turbinates.  Oropharynx - without lesions.  No significant tenderness to palpation.  NECK:  Supple.  Nontender.  No audible bruit.  HEART:  Appears to be regular. LUNGS:  No crackles or wheezing audible.  Respirations even and unlabored.      Assessment & Plan:  1. Gastroesophageal reflux disease with esophagitis Controlled on current regimen.   2. Personal history of ovarian cyst With large cyst.  Planning for surgery.  Will need to get infection clear first.    3. URI (upper respiratory infection) Increased cough and congestion as outlined.  Treat with zpak as directed.  Robitussin as directed.  Saline nasal spray and nasacort as directed.  Follow.

## 2014-02-18 ENCOUNTER — Other Ambulatory Visit: Payer: Self-pay | Admitting: Obstetrics and Gynecology

## 2014-03-07 ENCOUNTER — Encounter: Payer: Self-pay | Admitting: Internal Medicine

## 2014-03-07 ENCOUNTER — Ambulatory Visit (INDEPENDENT_AMBULATORY_CARE_PROVIDER_SITE_OTHER): Payer: 59 | Admitting: Internal Medicine

## 2014-03-07 ENCOUNTER — Encounter (INDEPENDENT_AMBULATORY_CARE_PROVIDER_SITE_OTHER): Payer: Self-pay

## 2014-03-07 VITALS — BP 120/80 | HR 87 | Temp 98.7°F | Ht 66.0 in | Wt 183.8 lb

## 2014-03-07 DIAGNOSIS — K2100 Gastro-esophageal reflux disease with esophagitis, without bleeding: Secondary | ICD-10-CM

## 2014-03-07 DIAGNOSIS — R102 Pelvic and perineal pain: Secondary | ICD-10-CM

## 2014-03-07 DIAGNOSIS — D72819 Decreased white blood cell count, unspecified: Secondary | ICD-10-CM

## 2014-03-07 DIAGNOSIS — Z91048 Other nonmedicinal substance allergy status: Secondary | ICD-10-CM

## 2014-03-07 DIAGNOSIS — E7800 Pure hypercholesterolemia, unspecified: Secondary | ICD-10-CM

## 2014-03-07 DIAGNOSIS — K2270 Barrett's esophagus without dysplasia: Secondary | ICD-10-CM

## 2014-03-07 DIAGNOSIS — Z8742 Personal history of other diseases of the female genital tract: Secondary | ICD-10-CM

## 2014-03-07 DIAGNOSIS — Z9109 Other allergy status, other than to drugs and biological substances: Secondary | ICD-10-CM

## 2014-03-07 NOTE — Progress Notes (Signed)
Pre visit review using our clinic review tool, if applicable. No additional management support is needed unless otherwise documented below in the visit note. 

## 2014-03-10 LAB — CBC WITH DIFFERENTIAL/PLATELET
Basophils Absolute: 0 10*3/uL (ref 0.0–0.1)
Basophils Relative: 0.1 % (ref 0.0–3.0)
Eosinophils Absolute: 0 10*3/uL (ref 0.0–0.7)
Eosinophils Relative: 0.9 % (ref 0.0–5.0)
HCT: 39.1 % (ref 36.0–46.0)
Hemoglobin: 12.8 g/dL (ref 12.0–15.0)
Lymphocytes Relative: 51.4 % — ABNORMAL HIGH (ref 12.0–46.0)
Lymphs Abs: 2 10*3/uL (ref 0.7–4.0)
MCHC: 32.8 g/dL (ref 30.0–36.0)
MCV: 93.2 fl (ref 78.0–100.0)
Monocytes Absolute: 0.4 10*3/uL (ref 0.1–1.0)
Monocytes Relative: 9 % (ref 3.0–12.0)
Neutro Abs: 1.5 10*3/uL (ref 1.4–7.7)
Neutrophils Relative %: 38.6 % — ABNORMAL LOW (ref 43.0–77.0)
Platelets: 203 10*3/uL (ref 150.0–400.0)
RBC: 4.19 Mil/uL (ref 3.87–5.11)
RDW: 13.9 % (ref 11.5–15.5)
WBC: 4 10*3/uL (ref 4.0–10.5)

## 2014-03-11 ENCOUNTER — Encounter: Payer: Self-pay | Admitting: Internal Medicine

## 2014-03-11 NOTE — Progress Notes (Signed)
Subjective:    Patient ID: Kelly Massey, female    DOB: September 03, 1965, 48 y.o.   MRN: [id number]  HPI 48 year old female with past history of hypercholesterolemia and GERD (with Barretts) who comes in today for a scheduled follow up.  She recently had a GI evaluation.  Had EGD and esophageal pH probe.  She is on Dexilant.  Having no burning now.  Acid reflux better.  Bowels stable.  Previous visit, she had some increased left lower quadrant pain.  Had CT that revealed left ovarian cyst.  Saw gyn.  Has had persistent intermittent flares.  Planning for surgery.  The respiratory infection has resolved.  No significant pain or problems since her last visit here.   Eating and drinking well.  No dysuria.  No increased urinary frequency.  No vaginal symptoms.  Has done well since her hysterectomy.   Overall she feels she is doing well.  Surgery is planned for 03/21/14.  No chest pain or tightness with increased activity or exertion.  Has noticed some right side chest soreness with palpation or sneezing.  No severe pain.  Discussed avoiding putting her arm in a strained position.  Pain is reproducible on exam.  No sob.  Overall feels good.     Past Medical History  Diagnosis Date  . Arthritis   . Hyperlipidemia     ?  Marland Kitchen Colon polyps   . HSV infection     History  . GERD (gastroesophageal reflux disease) 10/12/10    EGD, positive H. pylori  . Barrett's esophagus   . Anemia   . Post-operative nausea and vomiting     Current Outpatient Prescriptions on File Prior to Visit  Medication Sig Dispense Refill  . acyclovir (ZOVIRAX) 400 MG tablet TAKE 1 TABLET BY MOUTH TWICE A DAY 60 tablet 2  . Calcium Carbonate-Vitamin D (CALCIUM 600+D3) 600-400 MG-UNIT per tablet Take 1 tablet by mouth daily.    . Cholecalciferol (VITAMIN D PO) Take 2,000 Int'l Units by mouth daily. Gummy formulation    . DEXILANT 60 MG capsule TAKE 1 CAPSULE (60 MG TOTAL) BY MOUTH DAILY. 30 capsule 5  . diphenhydrAMINE (BENADRYL) 25 mg  capsule Take 12.5 mg by mouth at bedtime as needed. For allergies    . ferrous sulfate 325 (65 FE) MG tablet Take 325 mg by mouth daily.     . mometasone (NASONEX) 50 MCG/ACT nasal spray Place 2 sprays into the nose daily. (Patient taking differently: Place 2 sprays into the nose daily as needed. ) 51 g 3  . ranitidine (ZANTAC) 150 MG tablet Take 300 mg by mouth daily as needed for heartburn.     . traMADol (ULTRAM) 50 MG tablet Take 1 tablet (50 mg total) by mouth every 8 (eight) hours as needed for moderate pain (with food). 20 tablet 0  . vitamin E 400 UNIT capsule Take 400 Units by mouth daily.     No current facility-administered medications on file prior to visit.    Review of Systems Patient denies any headache, lightheadedness or dizziness.  No significant sinus or allergy symptoms.  No chest pain, tightness or palpitations with increased activity or exertion.  Reproducible pain as outlined.   No increased shortness of breath.  No cough or congestion.  No nausea or vomiting.  No abdominal pain or cramping currently.   No BRBPR or melana.  Bowels stable.    No vaginal symptoms.          Objective:  Physical Exam  Filed Vitals:   03/07/14 1536  BP: 120/80  Pulse: 87  Temp: 98.7 F (37.1 C)   Blood pressure recheck:  33/53  48 year old female in no acute distress.   HEENT:  Nares- clear.  Oropharynx - without lesions. NECK:  Supple.  Nontender.  No audible bruit.  HEART:  Appears to be regular. LUNGS:  No crackles or wheezing audible.  Respirations even and unlabored.  CHEST WALL:  Minimal reproducible pain to palpation over the right upper anterior chest.   RADIAL PULSE:  Equal bilaterally.  ABDOMEN:  Soft, nontender.  Bowel sounds present and normal.  No audible abdominal bruit     EXTREMITIES:  No increased edema present.  DP pulses palpable and equal bilaterally.        Assessment & Plan:  Leukopenia White count has been stable. Was worked up by hematology.  - CBC  with Differential  Pelvic pain in female None now.  Has an ovarian cyst.  Planning for surgery.    Gastroesophageal reflux disease with esophagitis Symptoms controlled on current regimen.  Follow.  Sees GI.   Barrett's esophagus Following with GI. Currently doing well.    Hypercholesterolemia Low cholesterol diet and exercise.  Follow lipid panel.  Lab Results  Component Value Date   CHOL 198 10/12/2013   HDL 78.10 10/12/2013   LDLCALC 111* 10/12/2013   TRIG 43.0 10/12/2013   CHOLHDL 3 10/12/2013   Environmental allergies Appear to be controlled.    Personal history of ovarian cyst Seeing gyn - Dr Garwin Brothers.  Planning for surgery 03/21/14.    CHEST WALL PAIN.  Reproducible on exam.  Tylenol.  Follow.  Avoid strained position. Let me know if persistent.   GYN.  S/P hysterectomy.  Found to have a left ovarian cyst previously.  Planning for surgery.     CARDIOVASCULAR.  Currently asymptomatic.    ELEVATED BLOOD PRESSURE.  Blood pressure doing well on no meds.  Follow.    PREVIOUS ABNORMAL MAMMOGRAM.  Mammogram 10/12/10 rec follow up views.  These were performed 10/15/10 - revealed what was read as a benign lymph node - Birads II.  Saw Dr Bary Castilla.  Getting regular mammograms in [place].  Mammogram 11/16/11 - BiRADS I.  Just had mammogram (diagnostic) 3/16 /15 - Birads II.     HEALTH MAINTENANCE.  Physical 11/05/13.  Pelvics through GYN (Dr Garwin Brothers).  Colonoscopy 10/12/10 with polyps removed.  Continues to follow with GI.   Mammogram as outlined.

## 2014-03-13 ENCOUNTER — Encounter: Payer: Self-pay | Admitting: *Deleted

## 2014-03-13 ENCOUNTER — Telehealth: Payer: Self-pay | Admitting: Internal Medicine

## 2014-03-13 NOTE — Telephone Encounter (Signed)
She should not have the surgery until her respiratory symptoms are clear.  Also, need to clarify the bloody mucus.  If coughing up blood, I would recommended being seen today.  If feels from drainage or just a tinge from nasal or sinus irritation - ok for appt scheduled as long as no other acute symptoms.

## 2014-03-13 NOTE — Telephone Encounter (Signed)
Spoke with pt, advised of MDs message.  Pt states she is not coughing up blood.  She is keeping appoint 12.10.15.

## 2014-03-13 NOTE — Telephone Encounter (Signed)
Unable to contact pt.  However as per Kelly Massey pt is wanting to know whether she should postpone her surgery.  Please advise

## 2014-03-13 NOTE — Telephone Encounter (Signed)
The patient has a sore throat and she has coughed up mucus mixed with blood . She wants to know what she should do ,because she is having surgery on 12.17.15.

## 2014-03-13 NOTE — Telephone Encounter (Signed)
The patient has an appointment with [facility] on 12.10.15 . She spoke with [facility] who instructed that she needed to be seen with in 24 hrs.

## 2014-03-14 ENCOUNTER — Ambulatory Visit (INDEPENDENT_AMBULATORY_CARE_PROVIDER_SITE_OTHER)
Admission: RE | Admit: 2014-03-14 | Discharge: 2014-03-14 | Disposition: A | Discharge status: Home / Self Care | Payer: 59 | Source: Ambulatory Visit | Attending: Nurse Practitioner | Admitting: Nurse Practitioner

## 2014-03-14 ENCOUNTER — Encounter: Payer: Self-pay | Admitting: Nurse Practitioner

## 2014-03-14 ENCOUNTER — Ambulatory Visit (INDEPENDENT_AMBULATORY_CARE_PROVIDER_SITE_OTHER): Payer: 59 | Admitting: Nurse Practitioner

## 2014-03-14 VITALS — BP 133/85 | HR 87 | Temp 98.3°F | Resp 12 | Ht 66.0 in | Wt 184.0 lb

## 2014-03-14 DIAGNOSIS — J069 Acute upper respiratory infection, unspecified: Secondary | ICD-10-CM

## 2014-03-14 DIAGNOSIS — R059 Cough, unspecified: Secondary | ICD-10-CM

## 2014-03-14 DIAGNOSIS — H65192 Other acute nonsuppurative otitis media, left ear: Secondary | ICD-10-CM

## 2014-03-14 DIAGNOSIS — R05 Cough: Secondary | ICD-10-CM

## 2014-03-14 MED ORDER — AZITHROMYCIN 250 MG PO TABS: ORAL_TABLET | ORAL | Status: DC

## 2014-03-14 NOTE — Progress Notes (Signed)
Pre visit review using our clinic review tool, if applicable. No additional management support is needed unless otherwise documented below in the visit note. 

## 2014-03-14 NOTE — Assessment & Plan Note (Signed)
Left TM was injected. Right TM visible landmarks/pearly gray. Z-pack for 5 days. FU prn.

## 2014-03-14 NOTE — Patient Instructions (Addendum)
   Your procedure is scheduled on:  Thursday, Dec 17  Enter through the [employer] [facility] at: 12:15 PM Pick up the phone at the desk and dial [phone] and inform us of your arrival.  Please call this number if you have any problems the morning of surgery: [phone]  Remember: Do not eat food after midnight: Wednesday Do not drink clear liquids after: 6 AM Thursday, day of surgery Take these medicines the morning of surgery with a SIP OF WATER:  Del[place]ant   Do not wear jewelry, make-up, or FINGER nail polish No metal in your hair or on your body. Do not wear lotions, powders, perfumes.  You may wear deodorant.  Do not bring valuables to the hospital. Contacts, dentures or bridgework may not be worn into surgery.  Leave suitcase in the car. After Surgery it may be brought to your room. For patients being admitted to the hospital, checkout time is 11:00am the day of discharge.  Home with husband Horris Latino cell [phone]

## 2014-03-14 NOTE — Patient Instructions (Addendum)
Your Chest X-ray will be done at [facility] office. They have the order and you can go anytime they are open. Preferably today.  You can gargle with salt water, use nasal saline spray, and take benadryl 25 mg at night for post nasal drip.   Take the Z-pack as directed and finish for ear infection.   Call us if you get a fever over 100.4, worsening symptoms, or fail to improve.

## 2014-03-14 NOTE — Assessment & Plan Note (Signed)
Chest X-ray came back negative. Discussed using saline nasal spray, gargling with salt water, and benadryl 25 mg at night to help dry up secretions. Instructed to return if fever over 100.4, worsening of symptoms, failure to improve.

## 2014-03-14 NOTE — Progress Notes (Signed)
Subjective:    Patient ID: Kelly Massey, female    DOB: [birth year], 48 y.o.   MRN: [id number]  HPI  Ms. Gemme is a 48 yo female with a CC of cough with mucous and blood tinged sputum.   1) Cough x 3 days, sore throat, morning time is worst. Cough produces yellow sputum with blood tinge once. When coughing or deep breathing she feels discomfort on right side. Robitussin, saline nasal spray treatment to date and is minimally helpful. Rhinorrhea is clear. 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 Z-pack last month for same symptoms. She is having surgery next week and she has had to reschedule once due to illness.   Review of Systems  Constitutional: Positive for fatigue. Negative for fever, chills and diaphoresis.  HENT: Positive for congestion, ear pain, postnasal drip, rhinorrhea, sneezing, sore throat, trouble swallowing and voice change. Negative for ear discharge.        Left ear pain, lots of mucous (has to swallow a lot to get it down).   Eyes: Negative for visual disturbance.  Respiratory: Positive for cough and chest tightness. Negative for shortness of breath and wheezing.        Feels "funny" on right and middle of chest only with deep breathing.   Cardiovascular: Negative for chest pain, palpitations and leg swelling.  Gastrointestinal: Negative for nausea, vomiting and diarrhea.  Musculoskeletal: Positive for back pain. Negative for neck stiffness.       Upper right back/near shoulder blade  Skin: Negative for rash.  Neurological: Positive for headaches. Negative for dizziness.   Past Medical History  Diagnosis Date  . Arthritis   . Hyperlipidemia     ?  Marland Kitchen Colon polyps   . HSV infection     History  . GERD (gastroesophageal reflux disease) 10/12/10    EGD, positive H. pylori  . Barrett's esophagus   . Anemia   . Post-operative nausea and vomiting     History   Social History  . Marital Status: Married    Spouse Name: N/A    Number of Children: 0  .  Years of Education: N/A   Occupational History  . RESEARCH [employer]   Social History Main Topics  . Smoking status: Never Smoker   . Smokeless tobacco: Never Used  . Alcohol Use: No  . Drug Use: No  . Sexual Activity: Not on file   Other Topics Concern  . Not on file   Social History Narrative    Past Surgical History  Procedure Laterality Date  . Shoulder arthroscopy Left   . Dilation and curettage of uterus      SAB  . Abdominal hysterectomy    . Ovarian cyst removal    . Esophagogastroduodenoscopy N/A 07/24/2012    Procedure: ESOPHAGOGASTRODUODENOSCOPY (EGD);  Surgeon: Inda Castle, MD;  Location: [facility];  Service: Endoscopy;  Laterality: N/A;  . Bravo ph study N/A 07/24/2012    Procedure: BRAVO Toast STUDY;  Surgeon: Inda Castle, MD;  Location: [facility];  Service: Endoscopy;  Laterality: N/A;  . Balloon dilation N/A 07/24/2012    Procedure: BALLOON DILATION;  Surgeon: Inda Castle, MD;  Location: [facility];  Service: Endoscopy;  Laterality: N/A;    Family History  Problem Relation Age of Onset  . Arthritis Mother   . Breast cancer Maternal Aunt   . Stroke Mother   . Hypertension Mother   . Heart failure Mother   . Colon  cancer Neg Hx   . Prostate cancer Maternal Uncle     x 2  . Diabetes Sister   . Diabetes Brother     x 2  . Diabetes Paternal Grandmother   . Hypertension Other   . Hyperlipidemia Other   . Diabetes Paternal Grandfather   . Lung cancer Father   . Diabetes Maternal Uncle   . Throat cancer Maternal Aunt   . Dementia Mother     Allergies  Allergen Reactions  . Adhesive [Tape] Other (See Comments)    Skin appeared "burned" after last surg.  . Aspirin Nausea And Vomiting  . Ibuprofen Nausea And Vomiting  . Penicillins Diarrhea  . Nsaids Other (See Comments)    GI pain  . Oxycodone Other (See Comments)    Severe abdominal cramps  . Shrimp [Shellfish Allergy] Nausea And Vomiting    Current Outpatient  Prescriptions on File Prior to Visit  Medication Sig Dispense Refill  . acyclovir (ZOVIRAX) 400 MG tablet TAKE 1 TABLET BY MOUTH TWICE A DAY 60 tablet 2  . Calcium Carbonate-Vitamin D (CALCIUM 600+D3) 600-400 MG-UNIT per tablet Take 1 tablet by mouth daily.    . Cholecalciferol (VITAMIN D PO) Take 2,000 Int'l Units by mouth daily. Gummy formulation    . DEXILANT 60 MG capsule TAKE 1 CAPSULE (60 MG TOTAL) BY MOUTH DAILY. 30 capsule 5  . diphenhydrAMINE (BENADRYL) 25 mg capsule Take 12.5 mg by mouth at bedtime as needed. For allergies    . ferrous sulfate 325 (65 FE) MG tablet Take 325 mg by mouth daily.     . mometasone (NASONEX) 50 MCG/ACT nasal spray Place 2 sprays into the nose daily. (Patient taking differently: Place 2 sprays into the nose daily as needed. ) 51 g 3  . ranitidine (ZANTAC) 150 MG tablet Take 300 mg by mouth daily as needed for heartburn.     . traMADol (ULTRAM) 50 MG tablet Take 1 tablet (50 mg total) by mouth every 8 (eight) hours as needed for moderate pain (with food). 20 tablet 0  . vitamin E 400 UNIT capsule Take 400 Units by mouth daily.     No current facility-administered medications on file prior to visit.       Objective:   Physical Exam  Constitutional: She is oriented to person, place, and time. She appears well-developed and well-nourished.  Eyes: Conjunctivae and EOM are normal. Pupils are equal, round, and reactive to light. Right eye exhibits no discharge. Left eye exhibits no discharge. No scleral icterus.  Left TM injected  Neck: Normal range of motion. Neck supple. No thyromegaly present.  Cardiovascular: Normal rate and regular rhythm.   Pulmonary/Chest: Effort normal and breath sounds normal.  Musculoskeletal: Normal range of motion. She exhibits no edema or tenderness.  Lymphadenopathy:    She has no cervical adenopathy.  Neurological: She is alert and oriented to person, place, and time.  Skin: Skin is warm and dry. No rash noted.  Psychiatric:  She has a normal mood and affect. Her behavior is normal. Judgment and thought content normal.   BP 133/85 mmHg  Pulse 87  Temp(Src) 98.3 F (36.8 C)  Resp 12  Ht 5\' 6"  (1.676 m)  Wt 184 lb (83.462 kg)  BMI 29.71 kg/m2  SpO2 100%  LMP 10/06/2011     Assessment & Plan:

## 2014-03-14 NOTE — Patient Instructions (Signed)
   Your procedure is scheduled on:  Thursday, Dec 17  Enter through the [employer] of [facility] at: 12:15 PM Pick up the phone at the desk and dial [phone] and inform us of your arrival.  Please call this number if you have any problems the morning of surgery: [phone]  Remember: Do not eat food after midnight: Wednesday Do not drink clear liquids after: 6 AM Thursday, day of surgery Take these medicines the morning of surgery with a SIP OF WATER:  Do not wear jewelry, make-up, or FINGER nail polish No metal in your hair or on your body. Do not wear lotions, powders, perfumes.  You may wear deodorant.  Do not bring valuables to the hospital. Contacts, dentures or bridgework may not be worn into surgery.  Leave suitcase in the car. After Surgery it may be brought to your room. For patients being admitted to the hospital, checkout time is 11:00am the day of discharge.

## 2014-03-15 ENCOUNTER — Encounter: Payer: Self-pay | Admitting: Nurse Practitioner

## 2014-03-15 ENCOUNTER — Encounter (HOSPITAL_COMMUNITY): Payer: Self-pay

## 2014-03-15 ENCOUNTER — Encounter (HOSPITAL_COMMUNITY)
Admission: RE | Admit: 2014-03-15 | Discharge: 2014-03-15 | Disposition: A | Discharge status: Home / Self Care | Payer: 59 | Source: Ambulatory Visit | Attending: Obstetrics and Gynecology | Admitting: Obstetrics and Gynecology

## 2014-03-15 DIAGNOSIS — Z01812 Encounter for preprocedural laboratory examination: Secondary | ICD-10-CM | POA: Insufficient documentation

## 2014-03-15 HISTORY — DX: Other seasonal allergic rhinitis: J30.2

## 2014-03-15 LAB — CBC
HCT: 40.1 % (ref 36.0–46.0)
Hemoglobin: 13.4 g/dL (ref 12.0–15.0)
MCH: 31.1 pg (ref 26.0–34.0)
MCHC: 33.4 g/dL (ref 30.0–36.0)
MCV: 93 fL (ref 78.0–100.0)
Platelets: 226 10*3/uL (ref 150–400)
RBC: 4.31 MIL/uL (ref 3.87–5.11)
RDW: 14.1 % (ref 11.5–15.5)
WBC: 5.5 10*3/uL (ref 4.0–10.5)

## 2014-03-15 NOTE — Pre-Procedure Instructions (Signed)
Patient was dx with an URI yesterday, started on z-pack x 5 days.  Spoke with Dr Lyndle Herrlich concerning patient's illness, continue with PAT appt.  If patient not fully recovered from URI, her surgery may be cancelled.  Patient informed.

## 2014-03-18 ENCOUNTER — Inpatient Hospital Stay (HOSPITAL_COMMUNITY)
Admission: RE | Admit: 2014-03-18 | Discharge: 2014-03-18 | Disposition: A | Discharge status: Home / Self Care | Payer: 59 | Source: Ambulatory Visit

## 2014-03-19 ENCOUNTER — Telehealth: Payer: Self-pay | Admitting: Internal Medicine

## 2014-03-19 NOTE — Telephone Encounter (Signed)
The patient wanted to inform the doctor that her throat is still sore and she is finished with her Z PAC . The patient is scheduled for surgery this Thursday at 1:45. Do you need to see the patient ?

## 2014-03-20 NOTE — Telephone Encounter (Signed)
Noted  

## 2014-03-20 NOTE — Telephone Encounter (Signed)
If she is having persistent symptoms, I would recommend complete recovery prior to surgery.  She can call Dr Garwin Brothers (her gyn doing the surgery) and confirm with them.

## 2014-03-20 NOTE — Telephone Encounter (Signed)
The patient called back and stated she has spoken with Dr.Cousins and she has been cleared for surgery.  She stated her symptoms have resolved.

## 2014-03-20 NOTE — Anesthesia Preprocedure Evaluation (Signed)
Anesthesia Evaluation  Patient identified by MRN, date of birth, ID band Patient awake    Reviewed: Allergy & Precautions, H&P , NPO status , Patient's Chart, lab work & pertinent test results  History of Anesthesia Complications (+) PONV and history of anesthetic complications  Airway Mallampati: II  TM Distance: >3 FB Neck ROM: Full    Dental no notable dental hx. (+) Dental Advisory Given   Pulmonary neg pulmonary ROS,  breath sounds clear to auscultation  Pulmonary exam normal       Cardiovascular negative cardio ROS  Rhythm:Regular Rate:Normal     Neuro/Psych negative neurological ROS  negative psych ROS   GI/Hepatic Neg liver ROS, GERD-  Medicated and Controlled,  Endo/Other  obesity  Renal/GU negative Renal ROS  negative genitourinary   Musculoskeletal  (+) Arthritis -, Osteoarthritis,    Abdominal   Peds negative pediatric ROS (+)  Hematology negative hematology ROS (+)   Anesthesia Other Findings   Reproductive/Obstetrics negative OB ROS                             Anesthesia Physical Anesthesia Plan  ASA: II  Anesthesia Plan: General   Post-op Pain Management:    Induction: Intravenous  Airway Management Planned: Oral ETT  Additional Equipment:   Intra-op Plan:   Post-operative Plan: Extubation in OR  Informed Consent: I have reviewed the patients History and Physical, chart, labs and discussed the procedure including the risks, benefits and alternatives for the proposed anesthesia with the patient or authorized representative who has indicated his/her understanding and acceptance.   Dental advisory given  Plan Discussed with: CRNA  Anesthesia Plan Comments:         Anesthesia Quick Evaluation

## 2014-03-21 ENCOUNTER — Ambulatory Visit (HOSPITAL_COMMUNITY): Payer: 59 | Admitting: Anesthesiology

## 2014-03-21 ENCOUNTER — Encounter (HOSPITAL_COMMUNITY)
Admission: RE | Disposition: A | Discharge status: Home / Self Care | Payer: Self-pay | Source: Ambulatory Visit | Attending: Obstetrics and Gynecology

## 2014-03-21 ENCOUNTER — Ambulatory Visit (HOSPITAL_COMMUNITY)
Admission: RE | Admit: 2014-03-21 | Discharge: 2014-03-21 | Disposition: A | Discharge status: Home / Self Care | Payer: 59 | Source: Ambulatory Visit | Attending: Obstetrics and Gynecology | Admitting: Obstetrics and Gynecology

## 2014-03-21 ENCOUNTER — Encounter (HOSPITAL_COMMUNITY): Payer: Self-pay | Admitting: Certified Registered Nurse Anesthetist

## 2014-03-21 DIAGNOSIS — E785 Hyperlipidemia, unspecified: Secondary | ICD-10-CM | POA: Insufficient documentation

## 2014-03-21 DIAGNOSIS — N736 Female pelvic peritoneal adhesions (postinfective): Secondary | ICD-10-CM | POA: Diagnosis not present

## 2014-03-21 DIAGNOSIS — Z880 Allergy status to penicillin: Secondary | ICD-10-CM | POA: Diagnosis not present

## 2014-03-21 DIAGNOSIS — N8320 Unspecified ovarian cysts: Secondary | ICD-10-CM | POA: Insufficient documentation

## 2014-03-21 DIAGNOSIS — Z886 Allergy status to analgesic agent status: Secondary | ICD-10-CM | POA: Diagnosis not present

## 2014-03-21 DIAGNOSIS — N83202 Unspecified ovarian cyst, left side: Secondary | ICD-10-CM

## 2014-03-21 DIAGNOSIS — Z79899 Other long term (current) drug therapy: Secondary | ICD-10-CM | POA: Diagnosis not present

## 2014-03-21 DIAGNOSIS — K219 Gastro-esophageal reflux disease without esophagitis: Secondary | ICD-10-CM | POA: Diagnosis not present

## 2014-03-21 HISTORY — PX: ROBOTIC ASSISTED LAPAROSCOPIC LYSIS OF ADHESION: SHX6080

## 2014-03-21 HISTORY — PX: ROBOTIC ASSISTED SALPINGO OOPHERECTOMY: SHX6082

## 2014-03-21 SURGERY — ROBOTIC ASSISTED SALPINGO OOPHORECTOMY
Anesthesia: General | Site: Abdomen

## 2014-03-21 MED ORDER — ALBUTEROL SULFATE (2.5 MG/3ML) 0.083% IN NEBU
2.5000 mg | INHALATION_SOLUTION | Freq: Once | RESPIRATORY_TRACT | Status: AC
  Administered 2014-03-21: 2.5 mg via RESPIRATORY_TRACT

## 2014-03-21 MED ORDER — LIDOCAINE HCL (PF) 1 % IJ SOLN
INTRAMUSCULAR | Status: AC
  Filled 2014-03-21: qty 5

## 2014-03-21 MED ORDER — NEOSTIGMINE METHYLSULFATE 10 MG/10ML IV SOLN
INTRAVENOUS | Status: DC | PRN
  Administered 2014-03-21: 5 mg via INTRAVENOUS

## 2014-03-21 MED ORDER — LIDOCAINE HCL (CARDIAC) 20 MG/ML IV SOLN
INTRAVENOUS | Status: DC | PRN
  Administered 2014-03-21: 30 mg via INTRAVENOUS

## 2014-03-21 MED ORDER — ALBUTEROL SULFATE (2.5 MG/3ML) 0.083% IN NEBU
INHALATION_SOLUTION | RESPIRATORY_TRACT | Status: AC
  Administered 2014-03-21: 2.5 mg via RESPIRATORY_TRACT
  Filled 2014-03-21: qty 3

## 2014-03-21 MED ORDER — FENTANYL CITRATE 0.05 MG/ML IJ SOLN
INTRAMUSCULAR | Status: AC
Start: 2014-03-21 — End: 2014-03-21
  Filled 2014-03-21: qty 5

## 2014-03-21 MED ORDER — HYDROMORPHONE HCL 1 MG/ML IJ SOLN
INTRAMUSCULAR | Status: AC
  Filled 2014-03-21: qty 1

## 2014-03-21 MED ORDER — MIDAZOLAM HCL 2 MG/2ML IJ SOLN
INTRAMUSCULAR | Status: DC | PRN
Start: 2014-03-21 — End: 2014-03-21
  Administered 2014-03-21: 2 mg via INTRAVENOUS

## 2014-03-21 MED ORDER — DEXAMETHASONE SODIUM PHOSPHATE 4 MG/ML IJ SOLN
INTRAMUSCULAR | Status: AC
  Filled 2014-03-21: qty 1

## 2014-03-21 MED ORDER — BUPIVACAINE HCL (PF) 0.25 % IJ SOLN
INTRAMUSCULAR | Status: DC | PRN
  Administered 2014-03-21: 9 mL

## 2014-03-21 MED ORDER — FENTANYL CITRATE 0.05 MG/ML IJ SOLN: 25.0000 ug | INTRAMUSCULAR | Status: DC | PRN

## 2014-03-21 MED ORDER — SCOPOLAMINE 1 MG/3DAYS TD PT72
MEDICATED_PATCH | TRANSDERMAL | Status: AC
  Administered 2014-03-21: 1.5 mg via TRANSDERMAL
  Filled 2014-03-21: qty 1

## 2014-03-21 MED ORDER — GLYCOPYRROLATE 0.2 MG/ML IJ SOLN
INTRAMUSCULAR | Status: AC
  Filled 2014-03-21: qty 1

## 2014-03-21 MED ORDER — LACTATED RINGERS IR SOLN
Status: DC | PRN
Start: 2014-03-21 — End: 2014-03-21
  Administered 2014-03-21: 3000 mL

## 2014-03-21 MED ORDER — ONDANSETRON HCL 4 MG/2ML IJ SOLN
INTRAMUSCULAR | Status: AC
Start: 2014-03-21 — End: 2014-03-21
  Filled 2014-03-21: qty 2

## 2014-03-21 MED ORDER — HYDROMORPHONE HCL 1 MG/ML IJ SOLN
INTRAMUSCULAR | Status: DC | PRN
  Administered 2014-03-21 (×2): 1 mg via INTRAVENOUS

## 2014-03-21 MED ORDER — ROPIVACAINE HCL 5 MG/ML IJ SOLN
INTRAMUSCULAR | Status: AC
  Filled 2014-03-21: qty 60

## 2014-03-21 MED ORDER — PROPOFOL 10 MG/ML IV EMUL
INTRAVENOUS | Status: AC
  Filled 2014-03-21: qty 20

## 2014-03-21 MED ORDER — ONDANSETRON HCL 4 MG/2ML IJ SOLN
INTRAMUSCULAR | Status: DC | PRN
  Administered 2014-03-21: 4 mg via INTRAVENOUS

## 2014-03-21 MED ORDER — SODIUM CHLORIDE 0.9 % IJ SOLN
INTRAMUSCULAR | Status: AC
Start: 2014-03-21 — End: 2014-03-21
  Filled 2014-03-21: qty 10

## 2014-03-21 MED ORDER — ALBUTEROL SULFATE (2.5 MG/3ML) 0.083% IN NEBU: 2.5000 mg | INHALATION_SOLUTION | Freq: Once | RESPIRATORY_TRACT | Status: DC

## 2014-03-21 MED ORDER — ROCURONIUM BROMIDE 100 MG/10ML IV SOLN
INTRAVENOUS | Status: DC | PRN
  Administered 2014-03-21: 10 mg via INTRAVENOUS
  Administered 2014-03-21: 50 mg via INTRAVENOUS

## 2014-03-21 MED ORDER — OXYCODONE-ACETAMINOPHEN 5-325 MG/5ML PO SOLN: 7.5000 mL | Freq: Four times a day (QID) | ORAL | Status: DC | PRN

## 2014-03-21 MED ORDER — ONDANSETRON HCL 4 MG/2ML IJ SOLN: 4.0000 mg | Freq: Once | INTRAMUSCULAR | Status: DC | PRN

## 2014-03-21 MED ORDER — PROPOFOL 10 MG/ML IV BOLUS
INTRAVENOUS | Status: DC | PRN
  Administered 2014-03-21: 200 mg via INTRAVENOUS

## 2014-03-21 MED ORDER — SODIUM CHLORIDE 0.9 % IJ SOLN
INTRAMUSCULAR | Status: AC
  Filled 2014-03-21: qty 50

## 2014-03-21 MED ORDER — HYDROMORPHONE HCL 1 MG/ML IJ SOLN
INTRAMUSCULAR | Status: AC
Start: 2014-03-21 — End: 2014-03-21
  Filled 2014-03-21: qty 1

## 2014-03-21 MED ORDER — GLYCOPYRROLATE 0.2 MG/ML IJ SOLN
INTRAMUSCULAR | Status: AC
  Filled 2014-03-21: qty 3

## 2014-03-21 MED ORDER — SODIUM CHLORIDE 0.9 % IV SOLN
INTRAVENOUS | Status: DC | PRN
  Administered 2014-03-21: 60 mL

## 2014-03-21 MED ORDER — LACTATED RINGERS IV SOLN
INTRAVENOUS | Status: DC
  Administered 2014-03-21 (×3): via INTRAVENOUS

## 2014-03-21 MED ORDER — FENTANYL CITRATE 0.05 MG/ML IJ SOLN
INTRAMUSCULAR | Status: DC | PRN
  Administered 2014-03-21 (×3): 50 ug via INTRAVENOUS
  Administered 2014-03-21: 100 ug via INTRAVENOUS

## 2014-03-21 MED ORDER — GLYCOPYRROLATE 0.2 MG/ML IJ SOLN
INTRAMUSCULAR | Status: DC | PRN
  Administered 2014-03-21: .9 mg via INTRAVENOUS

## 2014-03-21 MED ORDER — NEOSTIGMINE METHYLSULFATE 10 MG/10ML IV SOLN
INTRAVENOUS | Status: AC
Start: 2014-03-21 — End: 2014-03-21
  Filled 2014-03-21: qty 1

## 2014-03-21 MED ORDER — MIDAZOLAM HCL 2 MG/2ML IJ SOLN
INTRAMUSCULAR | Status: AC
  Filled 2014-03-21: qty 2

## 2014-03-21 MED ORDER — DEXAMETHASONE SODIUM PHOSPHATE 10 MG/ML IJ SOLN
INTRAMUSCULAR | Status: DC | PRN
  Administered 2014-03-21: 4 mg via INTRAVENOUS

## 2014-03-21 MED ORDER — BUPIVACAINE HCL (PF) 0.25 % IJ SOLN
INTRAMUSCULAR | Status: AC
  Filled 2014-03-21: qty 60

## 2014-03-21 MED ORDER — SCOPOLAMINE 1 MG/3DAYS TD PT72
1.0000 | MEDICATED_PATCH | Freq: Once | TRANSDERMAL | Status: DC
  Administered 2014-03-21: 1.5 mg via TRANSDERMAL

## 2014-03-21 SURGICAL SUPPLY — 57 items
BAG SPEC RTRVL LRG 6X4 10 (ENDOMECHANICALS) ×3
BARRIER ADHS 3X4 INTERCEED (GAUZE/BANDAGES/DRESSINGS) IMPLANT
BRR ADH 4X3 ABS CNTRL BYND (GAUZE/BANDAGES/DRESSINGS)
CATH FOLEY 3WAY  5CC 16FR (CATHETERS) ×1
CATH FOLEY 3WAY 5CC 16FR (CATHETERS) ×3 IMPLANT
CHLORAPREP W/TINT 26ML (MISCELLANEOUS) ×4 IMPLANT
CONT PATH 16OZ SNAP LID 3702 (MISCELLANEOUS) ×4 IMPLANT
COVER BACK TABLE 60X90IN (DRAPES) ×8 IMPLANT
COVER TIP SHEARS 8 DVNC (MISCELLANEOUS) ×3 IMPLANT
COVER TIP SHEARS 8MM DA VINCI (MISCELLANEOUS) ×1
DECANTER SPIKE VIAL GLASS SM (MISCELLANEOUS) IMPLANT
DEVICE TROCAR PUNCTURE CLOSURE (ENDOMECHANICALS) IMPLANT
DRAPE WARM FLUID 44X44 (DRAPE) ×4 IMPLANT
DRSG COVADERM PLUS 2X2 (GAUZE/BANDAGES/DRESSINGS) ×16 IMPLANT
DRSG OPSITE POSTOP 3X4 (GAUZE/BANDAGES/DRESSINGS) ×4 IMPLANT
ELECT REM PT RETURN 9FT ADLT (ELECTROSURGICAL) ×4
ELECTRODE REM PT RTRN 9FT ADLT (ELECTROSURGICAL) ×3 IMPLANT
EVACUATOR SMOKE 8.L (FILTER) ×4 IMPLANT
GAUZE VASELINE 3X9 (GAUZE/BANDAGES/DRESSINGS) IMPLANT
GLOVE BIOGEL PI IND STRL 7.0 (GLOVE) ×6 IMPLANT
GLOVE BIOGEL PI INDICATOR 7.0 (GLOVE) ×2
GLOVE ECLIPSE 6.5 STRL STRAW (GLOVE) ×4 IMPLANT
GOWN STRL REUS W/TWL LRG LVL3 (GOWN DISPOSABLE) ×32 IMPLANT
KIT ACCESSORY DA VINCI DISP (KITS) ×1
KIT ACCESSORY DVNC DISP (KITS) ×3 IMPLANT
LEGGING LITHOTOMY PAIR STRL (DRAPES) ×4 IMPLANT
LIQUID BAND (GAUZE/BANDAGES/DRESSINGS) ×4 IMPLANT
NEEDLE INSUFFLATION 120MM (ENDOMECHANICALS) ×4 IMPLANT
NS IRRIG 1000ML POUR BTL (IV SOLUTION) ×12 IMPLANT
OCCLUDER COLPOPNEUMO (BALLOONS) IMPLANT
PACK ROBOT WH (CUSTOM PROCEDURE TRAY) ×4 IMPLANT
PAD PREP 24X48 CUFFED NSTRL (MISCELLANEOUS) ×8 IMPLANT
PAD TRENDELENBURG POSITION (MISCELLANEOUS) ×4 IMPLANT
POUCH SPECIMEN RETRIEVAL 10MM (ENDOMECHANICALS) ×4 IMPLANT
PROTECTOR NERVE ULNAR (MISCELLANEOUS) ×8 IMPLANT
SCISSORS LAP 5X35 DISP (ENDOMECHANICALS) IMPLANT
SET CYSTO W/LG BORE CLAMP LF (SET/KITS/TRAYS/PACK) IMPLANT
SET IRRIG TUBING LAPAROSCOPIC (IRRIGATION / IRRIGATOR) ×4 IMPLANT
SUT VIC AB 0 CT1 27 (SUTURE) ×20
SUT VIC AB 0 CT1 27XBRD ANTBC (SUTURE) ×15 IMPLANT
SUT VICRYL 0 UR6 27IN ABS (SUTURE) ×4 IMPLANT
SUT VICRYL 4-0 PS2 18IN ABS (SUTURE) ×8 IMPLANT
SYR 50ML LL SCALE MARK (SYRINGE) ×4 IMPLANT
SYSTEM CONVERTIBLE TROCAR (TROCAR) IMPLANT
TIP UTERINE 5.1X6CM LAV DISP (MISCELLANEOUS) IMPLANT
TIP UTERINE 6.7X10CM GRN DISP (MISCELLANEOUS) IMPLANT
TIP UTERINE 6.7X6CM WHT DISP (MISCELLANEOUS) IMPLANT
TIP UTERINE 6.7X8CM BLUE DISP (MISCELLANEOUS) IMPLANT
TOWEL OR 17X24 6PK STRL BLUE (TOWEL DISPOSABLE) ×12 IMPLANT
TRAY FOLEY BAG SILVER LF 16FR (CATHETERS) ×4 IMPLANT
TROCAR 12M 150ML BLUNT (TROCAR) IMPLANT
TROCAR DISP BLADELESS 8 DVNC (TROCAR) ×3 IMPLANT
TROCAR DISP BLADELESS 8MM (TROCAR) ×1
TROCAR OPTI TIP 12M 100M (ENDOMECHANICALS) ×4 IMPLANT
TROCAR XCEL NON-BLD 5MMX100MML (ENDOMECHANICALS) IMPLANT
TROCAR Z-THREAD 12X150 (TROCAR) ×4 IMPLANT
WARMER LAPAROSCOPE (MISCELLANEOUS) ×4 IMPLANT

## 2014-03-21 NOTE — Brief Op Note (Signed)
03/21/2014  4:44 PM  PATIENT:  Kelly Massey  47 y.o. female  PRE-OPERATIVE DIAGNOSIS:  Persistent left ovarian cyst, LLQ pain POST-OPERATIVE DIAGNOSIS:  Persistent left ovarian cyst, LLQ pain, extensive left pelvic adhesions  PROCEDURE:  Procedure(s):  ROBOTIC ASSISTED LEFT OOPHORECTOMY (Left) ROBOTIC ASSISTED LAPAROSCOPIC EXTENSIVE LYSIS OF ADHESION (1 Hour) (N/A)  SURGEON:  Surgeon(s) and Role:    * Aleyza Salmi Clint Bolder, MD - Primary  PHYSICIAN ASSISTANT:   ASSISTANTS: Artelia Laroche, CNM   ANESTHESIA:   general Findings: nl right ovary, left ovary with cyst buried under bowels on the left, nl liver edge, ureter seen persistalsing EBL:  Total I/O In: 2000 [I.V.:2000] Out: 210 [Urine:200; Blood:10]  BLOOD ADMINISTERED:none  DRAINS: none   LOCAL MEDICATIONS USED:  MARCAINE     SPECIMEN:  Source of Specimen:  left ovary  DISPOSITION OF SPECIMEN:  PATHOLOGY  COUNTS:  YES  TOURNIQUET:  * No tourniquets in log *  DICTATION: .Other Dictation: Dictation Number [id number]  PLAN OF CARE: Discharge to home after PACU  PATIENT DISPOSITION:  PACU - hemodynamically stable.   Delay start of Pharmacological VTE agent (>24hrs) due to surgical blood loss or risk of bleeding: no

## 2014-03-21 NOTE — Anesthesia Postprocedure Evaluation (Signed)
Anesthesia Post Note  Patient: Kelly Massey  Procedure(s) Performed: Procedure(s) (LRB):  ROBOTIC ASSISTED LEFT OOPHORECTOMY (Left) ROBOTIC ASSISTED LAPAROSCOPIC EXTENSIVE LYSIS OF ADHESION (1 Hour) (N/A)  Anesthesia type: General  Patient location: PACU  Post pain: Pain level controlled  Post assessment: Post-op Vital signs reviewed  Last Vitals:  Filed Vitals:   03/21/14 1700  BP: 129/69  Pulse: 94  Temp:   Resp: 35    Post vital signs: Reviewed  Level of consciousness: sedated  Complications: No apparent anesthesia complications

## 2014-03-21 NOTE — Transfer of Care (Signed)
Immediate Anesthesia Transfer of Care Note  Patient: Kelly Massey  Procedure(s) Performed: Procedure(s):  ROBOTIC ASSISTED LEFT OOPHORECTOMY (Left) ROBOTIC ASSISTED LAPAROSCOPIC EXTENSIVE LYSIS OF ADHESION (1 Hour) (N/A)  Patient Location: PACU  Anesthesia Type:General  Level of Consciousness: awake, alert  and oriented  Airway & Oxygen Therapy: Patient Spontanous Breathing and Patient connected to nasal cannula oxygen  Post-op Assessment: Report given to PACU RN and Post -op Vital signs reviewed and stable  Post vital signs: Reviewed and stable  Complications: No apparent anesthesia complications

## 2014-03-21 NOTE — Discharge Instructions (Addendum)

## 2014-03-21 NOTE — H&P (Signed)
  Admit H&P     Cc: LLQ pain, left ovarian cyst  HPI: 48 yo [id number] BF  Hx hysterectomy with bilateral salpingectomy presents for surgical management of persistent left adnexal cystic mass( 8.5 cm) noted on sonogram done for intermittent LLQ pain. Pt is scheduled for daVinci robotic left oophrectomy. Pt has noted difficulty with BM and urination as a result of the mass.  PMH: all> no latex allergy PCN Advil Ibuprofen  Med: percocet Allegra dexilant Iron Zantac  Medical: GERD Hyperlipidemia Leukopenia Anemia Hx hysterectomy including cervix  surg hx:  Exp lap, left paratubal cystectomy 03/2003 Hysterioscopy, D&C 03/2003 daVinci total hysterectomy, bilateral salpingectomy, resection of endometriosis 10/2011 Left arthroscopic shoulder surgery 12/2008  FHx: breast cancer( mother, mat aunt) Lung cancer( F) Renal cancer ( bro) Negative: colon/ovarian cancer HTN( m, MGM, aunt)  OB: SAB  SH: nonsmoker, married.  ROS neg except HPI. Recent treatment for ear infection, URI  PE: see VS  WDWN BF in NAD Skin no lesion HEENT: pale pink conjunctiva, Oropharynx neg COR RRR Lungs: clear to A Abdomen: soft obese, laparoscopy, laparotomy scars Tender LLQ Pelvic; vulva no lesion Vagina: no d/c vaginal cuff tender with fullness on the left Cervix absent Uterus absent Adnexa: right nonpalp NT Left 8 cm fixed mass ( cyst)  Rectal: confirmatory  Ext; no edema or calf tenderness  IMP: persistent left adnexal mass Hx hysterectomy LLQ pain P) daVinci robotic left oophrectomy.pro Procedure explained. Surgical risk reviewed: infection, bleeding, injury to surrounding organ structures,. Previously scheduled on 02/2014 but r/s due to laryngitis

## 2014-03-22 ENCOUNTER — Encounter (HOSPITAL_COMMUNITY): Payer: Self-pay | Admitting: Obstetrics and Gynecology

## 2014-03-22 NOTE — Op Note (Signed)
NAME:  Kelly Massey, KELTZ NO.:  [id number]  MEDICAL RECORD NO.:  [id number]  LOCATION:  [facility]                          FACILITY:  [facility]  PHYSICIAN:  Servando Salina, M.D.DATE OF BIRTH:  May 05, 1965  DATE OF PROCEDURE:  03/21/2014 DATE OF DISCHARGE:  03/21/2014                              OPERATIVE REPORT   PREOPERATIVE DIAGNOSES:  Persistent left ovarian cyst, left lower quadrant pain.  PROCEDURE:  Da Vinci robotic-assisted extensive lysis of adhesions (1 hour), left oophorectomy.  POSTOPERATIVE DIAGNOSES:  Pelvic adhesions, extensive; left ovarian cyst.  ANESTHESIA:  General.  SURGEON:  Servando Salina, MD.  ASSISTANT:  Artelia Laroche, CNM.  PROCEDURE:  The patient was taken to the operating room where she was inducted under general anesthesia and placed in the dorsal lithotomy position.  She was positioned for robotic surgery.  The patient had a prior hysterectomy via the robot and therefore her incisions are still visible.  The patient was sterilely prepped and draped in usual fashion. An indwelling Foley catheter was placed.  A sponge on a ring clamp was placed in the vagina for delineation of the vaginal cuff.  Attention was then turned to the abdomen.  Marcaine 0.25% was injected along all the prior incision sites.  Vertical supraumbilical incision was made at the prior incision site.  Veress needle was introduced.  Opening pressure of 9 was noted, 2-1/2 L of CO2 was insufflated.  The Veress needle was then removed.  The Veress needle had been tested.  A 12-mm disposable trocar with sleeve was introduced into the abdomen without incident.  The robotic camera was then placed.  Inspection of the pelvis remained normal, liver edge and gallbladder visible, very clean pelvis, no evidence of endometriosis.  The right ovary was clearly visible and normal.  The left ovary could not be seen at all that is a matter of fact.  The bowels had completely taken  over the left pelvic sidewall and no visible area to support the ovary and the ovarian cyst was noted.  At that point, the additional port sites were placed under direct visualization.  The robot was then docked to the patient's left and once docked, PK dissector was placed on arm #2; in arm #1, the monopolar scissors; arm #3, the Prograf.  I then went to the surgical console.  At the surgical console, the left adnexa was inspected.  The bowel was adherent to the left sidewall.  Adhesiolysis started with the bowels to be taken off the left pelvic side wall, opening up the retroperitoneal space partially at that point.  Medially, whatever adhesions surrounding that bowels were carefully dissected with cold dissection.  The bowels at that point were lifted up and whereever clear scar tissue was noted using cold dissection, lysis of adhesions was performed.  This was continued until further deep dissection into the bundle in the left resulted in finding of some superficial blebs/peritoneal fluid content, which then led to most likely location of the ovary.  At that point, indeed part of the ovary was identified, but bowels had wrapped around it.  At that point, the bowel was further lifted up using the third arm.  Continued dissection  of the adhesions around that ovary bringing up that ovary further using the assistant to pull that portion of the ovary medially, and the dissection was then continued until the bowel roofing overlying the ovarian cyst was able to be separated away from the adnexa.  The peritoneal space on the left was then opened.  The ureter was at the scene once the ovary had been identified that was peristalsing. Distally, it was unclear as to whether or not there was partially pulled on traction from the overlying roof area.  Nonetheless, once the part of the bowel adhesion to that left lateral vaginal cuff was separated, the ureters were then noted to be further distally.   Partial dissection into the retroperitoneal space was done and proximally the ovarian vessels on the left side was isolated opening of the peritoneal space and at that point, the ureter was seen coming over the left pelvic brim.  The ovarian vessels were isolated, serially clamped, cauterized, and then cut.  At that point, the parietal peritoneum underneath the collapsed cyst and ovary was able to be further dissected off the pelvic sidewall, carried to the level of the vaginal cuff and careful dissection further to prevent ovarian remnant syndrome was done with the ovary found to be separated from its attachments.  The ureter was again noted to be clearly peristalsing all the way from the pelvic brim into the lateral ovarian fossa area.  With this being done, the pelvis was irrigated.  The camera port was switched to the #8 and using an Endobag at the supraumbilical site where the robot camera had been undocked at that point, the specimen was placed in the bag.  The robot was further completely undocked and removed.  I then went back to the patient's bedside.  The bag with the specimen was able to be brought up to the supraumbilical site.  The pelvis was then irrigated and suctioned.  Ropivacaine was placed overlying the adnexa and the robotic ports were then removed. Abdomen was deflated and the incisions were closed with 4-0 Vicryl subcuticular stitches and 0-Vicryl figure-of-eight for the fascia.  The ring clamp was removed as was the Foley.  It took 1 hour to unroof the bowels and separate it from the left ovary and bring out the ovary in view itself.  SPECIMEN:  Left ovary sent to Pathology.  ESTIMATED BLOOD LOSS:  10 mL.  INTRAOPERATIVE FLUIDS:  2 L.  URINE OUTPUT:  300 mL of clear yellow urine.  Sponge and instrument counts x2 was correct.  COMPLICATIONS:  None.  DISPOSITION:  The patient tolerated the procedure well, was transferred to the recovery room in stable  condition.     Servando Salina, M.D.     [place]/MEDQ  D:  03/21/2014  T:  03/22/2014  Job:  [id number]

## 2014-04-18 ENCOUNTER — Encounter (HOSPITAL_COMMUNITY): Payer: Self-pay | Admitting: Obstetrics and Gynecology

## 2014-06-20 ENCOUNTER — Other Ambulatory Visit: Payer: Self-pay | Admitting: Internal Medicine

## 2014-07-10 ENCOUNTER — Other Ambulatory Visit: Payer: Self-pay

## 2014-07-10 DIAGNOSIS — Z1231 Encounter for screening mammogram for malignant neoplasm of breast: Secondary | ICD-10-CM

## 2014-07-17 ENCOUNTER — Ambulatory Visit
Admission: RE | Admit: 2014-07-17 | Discharge: 2014-07-17 | Disposition: A | Discharge status: Home / Self Care | Payer: 59 | Source: Ambulatory Visit

## 2014-07-17 DIAGNOSIS — Z1231 Encounter for screening mammogram for malignant neoplasm of breast: Secondary | ICD-10-CM

## 2014-07-18 ENCOUNTER — Other Ambulatory Visit: Payer: Self-pay | Admitting: Internal Medicine

## 2014-07-18 DIAGNOSIS — N63 Unspecified lump in unspecified breast: Secondary | ICD-10-CM

## 2014-07-31 ENCOUNTER — Ambulatory Visit
Admission: RE | Admit: 2014-07-31 | Discharge: 2014-07-31 | Disposition: A | Discharge status: Home / Self Care | Payer: 59 | Source: Ambulatory Visit | Attending: Internal Medicine | Admitting: Internal Medicine

## 2014-07-31 ENCOUNTER — Other Ambulatory Visit: Payer: Self-pay | Admitting: Internal Medicine

## 2014-07-31 DIAGNOSIS — N63 Unspecified lump in unspecified breast: Secondary | ICD-10-CM

## 2014-08-21 ENCOUNTER — Other Ambulatory Visit: Payer: Self-pay | Admitting: *Deleted

## 2014-08-21 MED ORDER — ACYCLOVIR 400 MG PO TABS: 400.0000 mg | ORAL_TABLET | Freq: Two times a day (BID) | ORAL | Status: DC | PRN

## 2014-08-21 NOTE — Telephone Encounter (Signed)
ok'd refill acyclovir #30 with 1 refill.  rx sent in.

## 2014-08-21 NOTE — Telephone Encounter (Signed)
Pt requesting refill on Acyclovir. Okay to refill? Please advise. (Pt aware to check with pharmacy this afternoon if she has not heard from me)

## 2014-09-13 ENCOUNTER — Telehealth: Payer: Self-pay | Admitting: Internal Medicine

## 2014-09-13 ENCOUNTER — Emergency Department (HOSPITAL_COMMUNITY)
Admission: EM | Admit: 2014-09-13 | Discharge: 2014-09-13 | Disposition: A | Discharge status: Home / Self Care | Payer: 59 | Attending: Emergency Medicine | Admitting: Emergency Medicine

## 2014-09-13 ENCOUNTER — Encounter (HOSPITAL_COMMUNITY): Payer: Self-pay | Admitting: Emergency Medicine

## 2014-09-13 ENCOUNTER — Emergency Department (HOSPITAL_COMMUNITY): Payer: 59

## 2014-09-13 DIAGNOSIS — Z8619 Personal history of other infectious and parasitic diseases: Secondary | ICD-10-CM | POA: Diagnosis not present

## 2014-09-13 DIAGNOSIS — Z79899 Other long term (current) drug therapy: Secondary | ICD-10-CM | POA: Diagnosis not present

## 2014-09-13 DIAGNOSIS — Z7951 Long term (current) use of inhaled steroids: Secondary | ICD-10-CM | POA: Insufficient documentation

## 2014-09-13 DIAGNOSIS — Z880 Allergy status to penicillin: Secondary | ICD-10-CM | POA: Insufficient documentation

## 2014-09-13 DIAGNOSIS — M1990 Unspecified osteoarthritis, unspecified site: Secondary | ICD-10-CM | POA: Insufficient documentation

## 2014-09-13 DIAGNOSIS — Z86010 Personal history of colonic polyps: Secondary | ICD-10-CM | POA: Diagnosis not present

## 2014-09-13 DIAGNOSIS — K219 Gastro-esophageal reflux disease without esophagitis: Secondary | ICD-10-CM | POA: Insufficient documentation

## 2014-09-13 DIAGNOSIS — Z8639 Personal history of other endocrine, nutritional and metabolic disease: Secondary | ICD-10-CM | POA: Insufficient documentation

## 2014-09-13 DIAGNOSIS — R0789 Other chest pain: Secondary | ICD-10-CM

## 2014-09-13 DIAGNOSIS — D649 Anemia, unspecified: Secondary | ICD-10-CM | POA: Insufficient documentation

## 2014-09-13 DIAGNOSIS — Z862 Personal history of diseases of the blood and blood-forming organs and certain disorders involving the immune mechanism: Secondary | ICD-10-CM | POA: Insufficient documentation

## 2014-09-13 DIAGNOSIS — Z8709 Personal history of other diseases of the respiratory system: Secondary | ICD-10-CM | POA: Diagnosis not present

## 2014-09-13 LAB — BASIC METABOLIC PANEL
Anion gap: 7 (ref 5–15)
BUN: 9 mg/dL (ref 6–20)
CO2: 27 mmol/L (ref 22–32)
Calcium: 8.9 mg/dL (ref 8.9–10.3)
Chloride: 102 mmol/L (ref 101–111)
Creatinine, Ser: 0.69 mg/dL (ref 0.44–1.00)
GFR calc Af Amer: >60 mL/min (ref >60)
GFR calc non Af Amer: >60 mL/min (ref >60)
Glucose, Bld: 93 mg/dL (ref 65–99)
Potassium: 3.9 mmol/L (ref 3.5–5.1)
Sodium: 136 mmol/L (ref 135–145)

## 2014-09-13 LAB — CBC
HCT: 39.3 % (ref 36.0–46.0)
Hemoglobin: 13.1 g/dL (ref 12.0–15.0)
MCH: 30.4 pg (ref 26.0–34.0)
MCHC: 33.3 g/dL (ref 30.0–36.0)
MCV: 91.2 fL (ref 78.0–100.0)
Platelets: 211 10*3/uL (ref 150–400)
RBC: 4.31 MIL/uL (ref 3.87–5.11)
RDW: 13.3 % (ref 11.5–15.5)
WBC: 3.7 10*3/uL — ABNORMAL LOW (ref 4.0–10.5)

## 2014-09-13 LAB — I-STAT TROPONIN, ED: Troponin i, poc: 0 ng/mL (ref 0.00–0.08)

## 2014-09-13 MED ORDER — FENTANYL CITRATE (PF) 100 MCG/2ML IJ SOLN
25.0000 ug | Freq: Once | INTRAMUSCULAR | Status: AC
  Administered 2014-09-13: 25 ug via INTRAVENOUS
  Filled 2014-09-13: qty 2

## 2014-09-13 NOTE — Telephone Encounter (Signed)
Left message to return call to office.

## 2014-09-13 NOTE — ED Notes (Signed)
Pt reports ongoing right chest jabbing onset Sunday. Pt reports hx of GERD but pain unrelieved by home medications to treat for such.

## 2014-09-13 NOTE — Telephone Encounter (Signed)
Patient Name: DELLA Latella DOB: January 05, 1966 Initial Comment Caller states she is having chest pain. Also having neck pain and stiffness Nurse Assessment Nurse: Ronnald Ramp, RN, Miranda Date/Time (Eastern Time): 09/13/2014 9:19:11 AM Confirm and document reason for call. If symptomatic, describe symptoms. ---Caller states she has been having pain/stiffness in her neck for 1 week but gradually getting better. She has also been having pain in her chest off and on since last week. She has slight pain now. Has the patient traveled out of the country within the last 30 days? ---Not Applicable Does the patient require triage? ---Yes Related visit to physician within the last 2 weeks? ---No Does the PT have any chronic conditions? (i.e. diabetes, asthma, etc.) ---Yes List chronic conditions. ---GERD Did the patient indicate they were pregnant? ---No Guidelines Guideline Title Affirmed Question Affirmed Notes Chest Pain Chest pain lasts > 5 minutes (Exceptions: chest pain occurring > 3 days ago and now asymptomatic; same as previously diagnosed heartburn and has accompanying sour taste in mouth) Final Disposition User Go to ED Now (or PCP triage) Ronnald Ramp, RN, Miranda Comments Caller states she will go to [facility] for evaluation.

## 2014-09-13 NOTE — ED Notes (Signed)
Bed: WA05 Expected date:  Expected time:  Means of arrival:  Comments: 

## 2014-09-13 NOTE — Telephone Encounter (Signed)
Patient was seen at [facility]  ER and was advised her chest tightness and soreness is due to coughing. Patient stated she feels fine at this time.

## 2014-09-13 NOTE — Telephone Encounter (Signed)
Thanks.  Keep us posted 

## 2014-09-13 NOTE — Telephone Encounter (Signed)
Patient stated she is going to UC on lunch break, patient not distressed and rated pain at 2 on scale of 0-10. FYI

## 2014-09-13 NOTE — Discharge Instructions (Signed)
You were evaluated in the ED today for your chest discomfort. There does not appear to be an emergent cause for her symptoms at this time. Your exam, labs, chest x-ray and EKG are all reassuring. It is important to follow-up with your primary care for further evaluation and management of your symptoms. Return to ED for new or worsening symptoms.  Chest Pain (Nonspecific) It is often hard to give a specific diagnosis for the cause of chest pain. There is always a chance that your pain could be related to something serious, such as a heart attack or a blood clot in the lungs. You need to follow up with your health care provider for further evaluation. CAUSES   Heartburn.  Pneumonia or bronchitis.  Anxiety or stress.  Inflammation around your heart (pericarditis) or lung (pleuritis or pleurisy).  A blood clot in the lung.  A collapsed lung (pneumothorax). It can develop suddenly on its own (spontaneous pneumothorax) or from trauma to the chest.  Shingles infection (herpes zoster virus). The chest wall is composed of bones, muscles, and cartilage. Any of these can be the source of the pain.  The bones can be bruised by injury.  The muscles or cartilage can be strained by coughing or overwork.  The cartilage can be affected by inflammation and become sore (costochondritis). DIAGNOSIS  Lab tests or other studies may be needed to find the cause of your pain. Your health care provider may have you take a test called an ambulatory electrocardiogram (ECG). An ECG records your heartbeat patterns over a 24-hour period. You may also have other tests, such as:  Transthoracic echocardiogram (TTE). During echocardiography, sound waves are used to evaluate how blood flows through your heart.  Transesophageal echocardiogram (TEE).  Cardiac monitoring. This allows your health care provider to monitor your heart rate and rhythm in real time.  Holter monitor. This is a portable device that records your  heartbeat and can help diagnose heart arrhythmias. It allows your health care provider to track your heart activity for several days, if needed.  Stress tests by exercise or by giving medicine that makes the heart beat faster. TREATMENT   Treatment depends on what may be causing your chest pain. Treatment may include:  Acid blockers for heartburn.  Anti-inflammatory medicine.  Pain medicine for inflammatory conditions.  Antibiotics if an infection is present.  You may be advised to change lifestyle habits. This includes stopping smoking and avoiding alcohol, caffeine, and chocolate.  You may be advised to keep your head raised (elevated) when sleeping. This reduces the chance of acid going backward from your stomach into your esophagus. Most of the time, nonspecific chest pain will improve within 2-3 days with rest and mild pain medicine.  HOME CARE INSTRUCTIONS   If antibiotics were prescribed, take them as directed. Finish them even if you start to feel better.  For the next few days, avoid physical activities that bring on chest pain. Continue physical activities as directed.  Do not use any tobacco products, including cigarettes, chewing tobacco, or electronic cigarettes.  Avoid drinking alcohol.  Only take medicine as directed by your health care provider.  Follow your health care provider's suggestions for further testing if your chest pain does not go away.  Keep any follow-up appointments you made. If you do not go to an appointment, you could develop lasting (chronic) problems with pain. If there is any problem keeping an appointment, call to reschedule. SEEK MEDICAL CARE IF:   Your chest  pain does not go away, even after treatment.  You have a rash with blisters on your chest.  You have a fever. SEEK IMMEDIATE MEDICAL CARE IF:   You have increased chest pain or pain that spreads to your arm, neck, jaw, back, or abdomen.  You have shortness of breath.  You have  an increasing cough, or you cough up blood.  You have severe back or abdominal pain.  You feel nauseous or vomit.  You have severe weakness.  You faint.  You have chills. This is an emergency. Do not wait to see if the pain will go away. Get medical help at once. Call your local emergency services (911 in [place] Do not drive yourself to the hospital. MAKE SURE YOU:   Understand these instructions.  Will watch your condition.  Will get help right away if you are not doing well or get worse. Document Released: 12/30/2004 Document Revised: 03/27/2013 Document Reviewed: 10/26/2007 [facility] Patient Information 2015 [place], [place]. This information is not intended to replace advice given to you by your health care provider. Make sure you discuss any questions you have with your health care provider.

## 2014-09-13 NOTE — Telephone Encounter (Signed)
Please call and notify pt to update [place] after evaluated.  Thanks.

## 2014-09-13 NOTE — ED Provider Notes (Signed)
CSN: [id number]     Arrival date & time 09/13/14  1108 History   First MD Initiated Contact with Patient 09/13/14 1121     Chief Complaint  Patient presents with  . Chest Pain     (Consider location/radiation/quality/duration/timing/severity/associated sxs/prior Treatment) HPI Kelly Massey is a 49 y.o. female who comes in for evaluation of chest discomfort. Patient states since last Sunday she has had a right chest "pinching twinge" that comes and goes. It is not exertional. Unrelated to eating. She denies any shortness of breath, nausea or vomiting, diaphoresis. PERC negative. Nothing makes the pain better or worse. Rates discomfort as a 4/10. 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 Past Medical History  Diagnosis Date  . Hyperlipidemia     ? no meds - diet controlled  . Colon polyps   . HSV infection     History  . GERD (gastroesophageal reflux disease) 10/12/10    EGD, positive H. pylori  . Barrett's esophagus   . Anemia   . Post-operative nausea and vomiting   . Seasonal allergies   . URI (upper respiratory infection) 03/14/14    rx z-pack 5 days (cough, congestion, ear infection, sore throatx 3  days)  . Arthritis     knees - no meds   Past Surgical History  Procedure Laterality Date  . Shoulder arthroscopy Left   . Dilation and curettage of uterus      SAB  . Abdominal hysterectomy    . Ovarian cyst removal  2004    laparotomy -left  . Esophagogastroduodenoscopy N/A 07/24/2012    Procedure: ESOPHAGOGASTRODUODENOSCOPY (EGD);  Surgeon: Inda Castle, MD;  Location: [facility];  Service: Endoscopy;  Laterality: N/A;  . Bravo ph study N/A 07/24/2012    Procedure: BRAVO [place] STUDY;  Surgeon: Inda Castle, MD;  Location: [facility];  Service: Endoscopy;  Laterality: N/A;  . Balloon dilation N/A 07/24/2012    Procedure: BALLOON DILATION;  Surgeon: Inda Castle, MD;  Location: [facility];  Service: Endoscopy;  Laterality:  N/A;  . Wisdom tooth extraction    . Robotic assisted salpingo oopherectomy Left 03/21/2014    Procedure:  ROBOTIC ASSISTED LEFT OOPHORECTOMY;  Surgeon: Marvene Staff, MD;  Location: [facility];  Service: Gynecology;  Laterality: Left;  . Robotic assisted laparoscopic lysis of adhesion N/A 03/21/2014    Procedure: ROBOTIC ASSISTED LAPAROSCOPIC EXTENSIVE LYSIS OF ADHESION (1 Hour);  Surgeon: Marvene Staff, MD;  Location: [facility];  Service: Gynecology;  Laterality: N/A;   Family History  Problem Relation Age of Onset  . Arthritis Mother   . Breast cancer Maternal Aunt   . Stroke Mother   . Hypertension Mother   . Heart failure Mother   . Colon cancer Neg Hx   . Prostate cancer Maternal Uncle     x 2  . Diabetes Sister   . Diabetes Brother     x 2  . Diabetes Paternal Grandmother   . Hypertension Other   . Hyperlipidemia Other   . Diabetes Paternal Grandfather   . Lung cancer Father   . Diabetes Maternal Uncle   . Throat cancer Maternal Aunt   . Dementia Mother    History  Substance Use Topics  . Smoking status: Never Smoker   . Smokeless tobacco: Never Used  . Alcohol Use: No   OB History    No data available     Review of Systems A  10 point review of systems was completed and was negative except for pertinent positives and negatives as mentioned in the history of present illness     Allergies  Adhesive; Aspirin; Ibuprofen; Penicillins; Nsaids; Oxycodone; and Shrimp  Home Medications   Prior to Admission medications   Medication Sig Start Date End Date Taking? Authorizing Provider  Calcium Carbonate-Vitamin D (CALCIUM 600+D3) 600-400 MG-UNIT per tablet Take 1 tablet by mouth daily.   Yes Historical Provider, MD  Cholecalciferol (VITAMIN D PO) Take 2,000 Int'l Units by mouth daily. Gummy formulation   Yes Historical Provider, MD  DEXILANT 60 MG capsule TAKE 1 CAPSULE (60 MG TOTAL) BY MOUTH DAILY. 06/20/14  Yes Einar Pheasant, MD  diphenhydrAMINE (BENADRYL)  12.5 MG/5ML elixir Take 12.5 mg by mouth at bedtime as needed for sleep.   Yes Historical Provider, MD  ferrous sulfate 325 (65 FE) MG tablet Take 325 mg by mouth daily.    Yes Historical Provider, MD  mometasone (NASONEX) 50 MCG/ACT nasal spray Place 2 sprays into the nose daily. Patient taking differently: Place 2 sprays into the nose daily as needed.  11/05/13  Yes Einar Pheasant, MD  ranitidine (ZANTAC) 150 MG tablet Take 300 mg by mouth daily as needed for heartburn.    Yes Historical Provider, MD  vitamin E 400 UNIT capsule Take 400 Units by mouth daily.   Yes Historical Provider, MD  acyclovir (ZOVIRAX) 400 MG tablet Take 1 tablet (400 mg total) by mouth 2 (two) times daily as needed (for outbreaks). Patient not taking: Reported on 09/13/2014 08/21/14   Einar Pheasant, MD  azithromycin (ZITHROMAX Z-PAK) 250 MG tablet Take 1 by mouth daily until finished for ear infection. Patient not taking: Reported on 09/13/2014 03/14/14   Rubbie Battiest, NP  oxyCODONE-acetaminophen (ROXICET) [phone] MG/5ML solution Take 7.5 mLs by mouth every 6 (six) hours as needed for severe pain. Patient not taking: Reported on 09/13/2014 03/21/14   Servando Salina, MD  traMADol (ULTRAM) 50 MG tablet Take 1 tablet (50 mg total) by mouth every 8 (eight) hours as needed for moderate pain (with food). Patient not taking: Reported on 09/13/2014 12/25/13   Abner Greenspan, MD   BP 121/77 mmHg  Pulse 75  Temp(Src) 97.6 F (36.4 C) (Oral)  Resp 19  SpO2 99%  LMP 10/06/2011 Physical Exam  Constitutional: She is oriented to person, place, and time. She appears well-developed and well-nourished.  HENT:  Head: Normocephalic and atraumatic.  Mouth/Throat: Oropharynx is clear and moist.  Eyes: Conjunctivae are normal. Pupils are equal, round, and reactive to light. Right eye exhibits no discharge. Left eye exhibits no discharge. No scleral icterus.  Neck: Neck supple.  Cardiovascular: Normal rate, regular rhythm and normal heart  sounds.   Pulmonary/Chest: Effort normal and breath sounds normal. No respiratory distress. She has no wheezes. She has no rales.  Chest discomfort is reproduced with palpation of right pectoralis major muscles as well as intercostal spaces.  Abdominal: Soft. There is no tenderness.  Musculoskeletal: She exhibits no tenderness.  Neurological: She is alert and oriented to person, place, and time.  Cranial Nerves II-XII grossly intact  Skin: Skin is warm and dry. No rash noted.  Psychiatric: She has a normal mood and affect.  Nursing note and vitals reviewed.   ED Course  Procedures (including critical care time) Labs Review Labs Reviewed  CBC - Abnormal; Notable for the following:    WBC 3.7 (*)    All other components within normal limits  BASIC METABOLIC PANEL  Randolm Idol, ED    Imaging Review Dg Chest 2 View  09/13/2014   CLINICAL DATA:  Chest pain  EXAM: CHEST  2 VIEW  COMPARISON:  03/14/2014  FINDINGS: The heart size and mediastinal contours are within normal limits. Both lungs are clear. The visualized skeletal structures are unremarkable.  IMPRESSION: Negative chest.   Electronically Signed   By: Monte Fantasia M.D.   On: 09/13/2014 12:06     EKG Interpretation   Date/Time:  Friday September 13 2014 12:02:55 EDT Ventricular Rate:  89 PR Interval:  143 QRS Duration: 78 QT Interval:  377 QTC Calculation: 459 R Axis:   40 Text Interpretation:  Sinus rhythm Low voltage, precordial leads No  significant change since last tracing Confirmed by DOCHERTY  MD, MEGAN  [phone]) on 09/13/2014 12:18:24 PM     Meds given in ED:  Medications  fentaNYL (SUBLIMAZE) injection 25 mcg (25 mcg Intravenous Given 09/13/14 1207)    Discharge Medication List as of 09/13/2014  1:21 PM     Filed Vitals:   09/13/14 1114 09/13/14 1130 09/13/14 1407  BP: 138/84 136/84 121/77  Pulse: 86 94 75  Temp: 97.6 F (36.4 C)    TempSrc: Oral    Resp: 18 24 19   SpO2: 98% 98% 99%    MDM   Vitals stable - WNL -afebrile Pt resting comfortably in ED. minimal chest discomfort in the ED that is reproducible on physical exam. PE--Lung exam normal. Cardiac auscultation reveals no murmurs rubs or gallops. Grossly Benign Physical Exam Labwork: Initial/DeltaTroponin negative. EKG reassuring.  Labs otherwise noncontributory Imaging: CXR shows no acute cardiopulmonary pathology  DDX: Patient with chest discomfort likely due to MSK. Clinical picture and exam today not consistent with ACS/dissection. Heart score 1-2 (age and proximity to smoking, although patient is a nonsmoker). No evidence of spontaneous pneumothorax, esophageal rupture or other mediastinitis. PERC negative, doubt PE. No evidence of myocarditis, endocarditis, pericarditis.  Patient reports she will be able to take Tylenol at home for your discomfort he may experience. I discussed all relevant lab findings and imaging results with pt and they verbalized understanding. Discussed f/u with PCP within 48 hrs and return precautions, pt very amenable to plan.  Final diagnoses:  Chest discomfort       Comer Locket, PA-C 09/13/14 1627  Ernestina Patches, MD 09/15/14 1016

## 2014-09-20 ENCOUNTER — Ambulatory Visit: Payer: 59 | Admitting: Internal Medicine

## 2014-10-08 ENCOUNTER — Ambulatory Visit (INDEPENDENT_AMBULATORY_CARE_PROVIDER_SITE_OTHER): Payer: 59 | Admitting: Family Medicine

## 2014-10-08 ENCOUNTER — Ambulatory Visit: Payer: 59 | Admitting: Nurse Practitioner

## 2014-10-08 ENCOUNTER — Encounter: Payer: Self-pay | Admitting: Family Medicine

## 2014-10-08 VITALS — BP 124/68 | HR 96 | Temp 98.2°F | Ht 66.0 in | Wt 196.5 lb

## 2014-10-08 DIAGNOSIS — M7062 Trochanteric bursitis, left hip: Secondary | ICD-10-CM | POA: Diagnosis not present

## 2014-10-08 MED ORDER — ACYCLOVIR 400 MG PO TABS: 400.0000 mg | ORAL_TABLET | Freq: Two times a day (BID) | ORAL | Status: DC | PRN

## 2014-10-08 MED ORDER — PREDNISONE 10 MG PO TABS: ORAL_TABLET | ORAL | Status: DC

## 2014-10-08 NOTE — Patient Instructions (Signed)
I think you have trochanteric bursitis  Use ice and heat on the painful area  Also try the prednisone - take as directed and finish it all - it may make you hyper and hungry -this is to decrease inflammation Watch for a rash- and let us know if that occurs   Make an appt with Dr Lorelei Pont if no improvement or if worse

## 2014-10-08 NOTE — Assessment & Plan Note (Signed)
Pain and tenderness over greater trochanter of L leg - pain rad down leg  No active back pain at this time Cannot tol nsaids, instead will try a low taper of prednisone (handout given)  Disc stretches-sportmed handout on this condition also given  If no imp will make appt with Dr Lorelei Pont As always -watch for rash with all pain (poss of shingles is low but we watch for that also)  Disc symptomatic care - see instructions on AVS Update if not starting to improve in a week or if worsening

## 2014-10-08 NOTE — Progress Notes (Signed)
Pre visit review using our clinic review tool, if applicable. No additional management support is needed unless otherwise documented below in the visit note. 

## 2014-10-08 NOTE — Progress Notes (Signed)
Subjective:    Patient ID: Kelly Massey, female    DOB: 1965/06/18, 49 y.o.   MRN: [id number]  HPI Here for leg pain  L leg  In lateral upper leg - radiates down  Is throbbing and achey   No change with position or different activity  It "pulls" to dorsiflex her foot a bit   Has had xray of hip in the past   Low back occasionally bothers her -but not lately     Last week some calf /lower leg pain also  She started wearing her compression hose  That really helps   Needs a refill of acyclovir  HSV  Has appt with Dr Nicki Reaper for PE in aug    Patient Active Problem List   Diagnosis Date Noted  . Non-suppurative otitis media 03/14/2014  . URI (upper respiratory infection) 02/10/2014  . Pelvic pain in female 12/25/2013  . Personal history of ovarian cyst 12/25/2013  . Change in bowel movement 11/11/2013  . breast tenderness 09/29/2013  . Dysphagia, unspecified(787.20) 06/12/2012  . Barrett's esophagus 06/12/2012  . Environmental allergies 02/13/2012  . Enlarged thyroid 02/13/2012  . GERD (gastroesophageal reflux disease) 02/08/2012  . Leukopenia 02/08/2012  . Fatigue 02/08/2012  . Hypercholesterolemia 02/08/2012   Past Medical History  Diagnosis Date  . Hyperlipidemia     ? no meds - diet controlled  . Colon polyps   . HSV infection     History  . GERD (gastroesophageal reflux disease) 10/12/10    EGD, positive H. pylori  . Barrett's esophagus   . Anemia   . Post-operative nausea and vomiting   . Seasonal allergies   . URI (upper respiratory infection) 03/14/14    rx z-pack 5 days (cough, congestion, ear infection, sore throatx 3  days)  . Arthritis     knees - no meds   Past Surgical History  Procedure Laterality Date  . Shoulder arthroscopy Left   . Dilation and curettage of uterus      SAB  . Abdominal hysterectomy    . Ovarian cyst removal  2004    laparotomy -left  . Esophagogastroduodenoscopy N/A 07/24/2012    Procedure:  ESOPHAGOGASTRODUODENOSCOPY (EGD);  Surgeon: Inda Castle, MD;  Location: [facility];  Service: Endoscopy;  Laterality: N/A;  . Bravo ph study N/A 07/24/2012    Procedure: BRAVO [place] STUDY;  Surgeon: Inda Castle, MD;  Location: [facility];  Service: Endoscopy;  Laterality: N/A;  . Balloon dilation N/A 07/24/2012    Procedure: BALLOON DILATION;  Surgeon: Inda Castle, MD;  Location: [facility];  Service: Endoscopy;  Laterality: N/A;  . Wisdom tooth extraction    . Robotic assisted salpingo oopherectomy Left 03/21/2014    Procedure:  ROBOTIC ASSISTED LEFT OOPHORECTOMY;  Surgeon: Marvene Staff, MD;  Location: [facility];  Service: Gynecology;  Laterality: Left;  . Robotic assisted laparoscopic lysis of adhesion N/A 03/21/2014    Procedure: ROBOTIC ASSISTED LAPAROSCOPIC EXTENSIVE LYSIS OF ADHESION (1 Hour);  Surgeon: Marvene Staff, MD;  Location: [facility];  Service: Gynecology;  Laterality: N/A;   History  Substance Use Topics  . Smoking status: Never Smoker   . Smokeless tobacco: Never Used  . Alcohol Use: No   Family History  Problem Relation Age of Onset  . Arthritis Mother   . Breast cancer Maternal Aunt   . Stroke Mother   . Hypertension Mother   . Heart failure Mother   . Colon cancer Neg Hx   .  Prostate cancer Maternal Uncle     x 2  . Diabetes Sister   . Diabetes Brother     x 2  . Diabetes Paternal Grandmother   . Hypertension Other   . Hyperlipidemia Other   . Diabetes Paternal Grandfather   . Lung cancer Father   . Diabetes Maternal Uncle   . Throat cancer Maternal Aunt   . Dementia Mother    Allergies  Allergen Reactions  . Adhesive [Tape] Other (See Comments)    Skin appeared "burned" after last surg.  . Aspirin Nausea And Vomiting  . Ibuprofen Nausea And Vomiting  . Penicillins Diarrhea  . Nsaids Other (See Comments)    GI pain  . Oxycodone Other (See Comments)    Severe abdominal cramps  . Shrimp [Shellfish Allergy] Nausea And Vomiting     Current Outpatient Prescriptions on File Prior to Visit  Medication Sig Dispense Refill  . acyclovir (ZOVIRAX) 400 MG tablet Take 1 tablet (400 mg total) by mouth 2 (two) times daily as needed (for outbreaks). 30 tablet 1  . Calcium Carbonate-Vitamin D (CALCIUM 600+D3) 600-400 MG-UNIT per tablet Take 1 tablet by mouth daily.    . Cholecalciferol (VITAMIN D PO) Take 2,000 Int'l Units by mouth daily. Gummy formulation    . DEXILANT 60 MG capsule TAKE 1 CAPSULE (60 MG TOTAL) BY MOUTH DAILY. 30 capsule 5  . diphenhydrAMINE (BENADRYL) 12.5 MG/5ML elixir Take 12.5 mg by mouth at bedtime as needed for sleep.    . ferrous sulfate 325 (65 FE) MG tablet Take 325 mg by mouth daily.     . mometasone (NASONEX) 50 MCG/ACT nasal spray Place 2 sprays into the nose daily. (Patient taking differently: Place 2 sprays into the nose daily as needed. ) 51 g 3  . ranitidine (ZANTAC) 150 MG tablet Take 300 mg by mouth daily as needed for heartburn.     . vitamin E 400 UNIT capsule Take 400 Units by mouth daily.     No current facility-administered medications on file prior to visit.     Review of Systems Review of Systems  Constitutional: Negative for fever, appetite change, fatigue and unexpected weight change.  Eyes: Negative for pain and visual disturbance.  Respiratory: Negative for cough and shortness of breath.   Cardiovascular: Negative for cp or palpitations    Gastrointestinal: Negative for nausea, diarrhea and constipation.  Genitourinary: Negative for urgency and frequency.  Skin: Negative for pallor or rash   MSK pos for L leg pain / pos for occ low back pain (not now) , neg for joint swelling  Neurological: Negative for weakness, light-headedness, numbness and headaches.  Hematological: Negative for adenopathy. Does not bruise/bleed easily.  Psychiatric/Behavioral: Negative for dysphoric mood. The patient is not nervous/anxious.         Objective:   Physical Exam  Constitutional: She  appears well-nourished. No distress.  obese and well appearing   HENT:  Head: Normocephalic and atraumatic.  Eyes: Conjunctivae and EOM are normal. Pupils are equal, round, and reactive to light. No scleral icterus.  Neck: Normal range of motion. Neck supple.  Cardiovascular: Normal rate and regular rhythm.   Pulmonary/Chest: Effort normal and breath sounds normal.  Abdominal: Soft. Bowel sounds are normal.  Musculoskeletal: She exhibits tenderness. She exhibits no edema.  LLE: tender over greater trochanter and IT band  Worse pain with int rotation and full flex of hip  Neg slr No LS tenderness No leg swelling  No lower leg tenderness/palp  cords  Nl perf and sens Neg homan sign   Nl rom spine Nl gait  Lymphadenopathy:    She has no cervical adenopathy.  Neurological: She is alert. She has normal reflexes. She displays no atrophy. No sensory deficit.  Skin: Skin is warm and dry. No rash noted. No erythema. No pallor.  Psychiatric: She has a normal mood and affect.          Assessment & Plan:   Problem List Items Addressed This Visit    Trochanteric bursitis of left hip - Primary    Pain and tenderness over greater trochanter of L leg - pain rad down leg  No active back pain at this time Cannot tol nsaids, instead will try a low taper of prednisone (handout given)  Disc stretches-sportmed handout on this condition also given  If no imp will make appt with Dr Lorelei Pont As always -watch for rash with all pain (poss of shingles is low but we watch for that also)  Disc symptomatic care - see instructions on AVS Update if not starting to improve in a week or if worsening

## 2014-11-19 ENCOUNTER — Ambulatory Visit (INDEPENDENT_AMBULATORY_CARE_PROVIDER_SITE_OTHER): Payer: 59 | Admitting: Internal Medicine

## 2014-11-19 ENCOUNTER — Encounter: Payer: Self-pay | Admitting: Internal Medicine

## 2014-11-19 VITALS — BP 122/64 | HR 87 | Temp 98.8°F | Ht 65.35 in | Wt 194.6 lb

## 2014-11-19 DIAGNOSIS — K2100 Gastro-esophageal reflux disease with esophagitis, without bleeding: Secondary | ICD-10-CM

## 2014-11-19 DIAGNOSIS — D72819 Decreased white blood cell count, unspecified: Secondary | ICD-10-CM | POA: Diagnosis not present

## 2014-11-19 DIAGNOSIS — Z0000 Encounter for general adult medical examination without abnormal findings: Secondary | ICD-10-CM | POA: Diagnosis not present

## 2014-11-19 DIAGNOSIS — E7800 Pure hypercholesterolemia, unspecified: Secondary | ICD-10-CM

## 2014-11-19 DIAGNOSIS — K2270 Barrett's esophagus without dysplasia: Secondary | ICD-10-CM

## 2014-11-19 DIAGNOSIS — R1310 Dysphagia, unspecified: Secondary | ICD-10-CM | POA: Diagnosis not present

## 2014-11-19 DIAGNOSIS — E049 Nontoxic goiter, unspecified: Secondary | ICD-10-CM

## 2014-11-19 DIAGNOSIS — Z9109 Other allergy status, other than to drugs and biological substances: Secondary | ICD-10-CM

## 2014-11-19 DIAGNOSIS — Z91048 Other nonmedicinal substance allergy status: Secondary | ICD-10-CM

## 2014-11-19 DIAGNOSIS — Z86010 Personal history of colonic polyps: Secondary | ICD-10-CM

## 2014-11-19 MED ORDER — DEXLANSOPRAZOLE 60 MG PO CPDR: DELAYED_RELEASE_CAPSULE | ORAL | Status: DC

## 2014-11-19 MED ORDER — ACYCLOVIR 400 MG PO TABS: 400.0000 mg | ORAL_TABLET | Freq: Two times a day (BID) | ORAL | Status: DC | PRN

## 2014-11-19 NOTE — Progress Notes (Signed)
Patient ID: Kelly Massey, female   DOB: [birth year], 49 y.o.   MRN: [id number]   Subjective:    Patient ID: Kelly Massey, female    DOB: 1966/02/23, 49 y.o.   MRN: [id number]  HPI  Patient here for a scheduled follow up.  She is having issues with swallowing.  States in the am she will eat breakfast and then her food "comes back up".  Describes no actual acid reflux.  She is taking dexilant and zantac.  Her last EGD was 07/2012.  Is s/p dilatation.  Feels needs to be seen again.  No abdominal pain.  Bowels stable.  Is s/p hysterectomy.  No lower pelvic pain.  Increased stress with her job.  Feels she is handling things relatively well.  Does not feel needs any further intervention at this time.     Past Medical History  Diagnosis Date  . Hyperlipidemia     ? no meds - diet controlled  . Colon polyps   . HSV infection     History  . GERD (gastroesophageal reflux disease) 10/12/10    EGD, positive H. pylori  . Barrett's esophagus   . Anemia   . Post-operative nausea and vomiting   . Seasonal allergies   . URI (upper respiratory infection) 03/14/14    rx z-pack 5 days (cough, congestion, ear infection, sore throatx 3  days)  . Arthritis     knees - no meds    Family history and social history reviewed.     Outpatient Encounter Prescriptions as of 11/19/2014  Medication Sig  . acyclovir (ZOVIRAX) 400 MG tablet Take 1 tablet (400 mg total) by mouth 2 (two) times daily as needed (for outbreaks).  . Calcium Carbonate-Vitamin D (CALCIUM 600+D3) 600-400 MG-UNIT per tablet Take 1 tablet by mouth daily.  . Cholecalciferol (VITAMIN D PO) Take 2,000 Int'l Units by mouth daily. Gummy formulation  . dexlansoprazole (DEXILANT) 60 MG capsule TAKE 1 CAPSULE (60 MG TOTAL) BY MOUTH DAILY.  . diphenhydrAMINE (BENADRYL) 12.5 MG/5ML elixir Take 12.5 mg by mouth at bedtime as needed for sleep.  . ferrous sulfate 325 (65 FE) MG tablet Take 325 mg by mouth daily.   . mometasone (NASONEX) 50 MCG/ACT  nasal spray Place 2 sprays into the nose daily. (Patient taking differently: Place 2 sprays into the nose daily as needed. )  . ranitidine (ZANTAC) 150 MG tablet Take 300 mg by mouth daily as needed for heartburn.   . vitamin E 400 UNIT capsule Take 400 Units by mouth daily.  . [DISCONTINUED] acyclovir (ZOVIRAX) 400 MG tablet Take 1 tablet (400 mg total) by mouth 2 (two) times daily as needed (for outbreaks).  . [DISCONTINUED] DEXILANT 60 MG capsule TAKE 1 CAPSULE (60 MG TOTAL) BY MOUTH DAILY.  . [DISCONTINUED] predniSONE (DELTASONE) 10 MG tablet Take 3 pills once daily by mouth for 3 days, then 2 pills once daily for 3 days, then 1 pill once daily for 3 days and then stop   No facility-administered encounter medications on file as of 11/19/2014.    Review of Systems  Constitutional: Negative for fever and unexpected weight change.  HENT: Negative for congestion, sinus pressure and sore throat.   Respiratory: Negative for cough, chest tightness and shortness of breath.   Cardiovascular: Negative for chest pain, palpitations and leg swelling.  Gastrointestinal: Negative for nausea, vomiting, abdominal pain and diarrhea.       Does report the dysphagia as outlined.  Food comes up after eating  breakfast.  No acid reflux.    Genitourinary: Negative for dysuria and difficulty urinating.  Musculoskeletal: Negative for back pain and joint swelling.  Skin: Negative for color change and rash.  Neurological: Negative for dizziness, light-headedness and headaches.  Psychiatric/Behavioral: Negative for dysphoric mood and agitation.       Objective:    Physical Exam  Constitutional: She is oriented to person, place, and time. She appears well-developed and well-nourished. No distress.  HENT:  Nose: Nose normal.  Mouth/Throat: Oropharynx is clear and moist.  Eyes: Right eye exhibits no discharge. Left eye exhibits no discharge. No scleral icterus.  Neck: Neck supple.  Cardiovascular: Normal rate  and regular rhythm.   Pulmonary/Chest: Breath sounds normal. No accessory muscle usage. No tachypnea. No respiratory distress. She has no decreased breath sounds. She has no wheezes. She has no rhonchi. Right breast exhibits no inverted nipple, no mass, no nipple discharge and no tenderness (no axillary adenopathy). Left breast exhibits no inverted nipple, no mass, no nipple discharge and no tenderness (no axilarry adenopathy).  Abdominal: Soft. Bowel sounds are normal. There is no tenderness.  Musculoskeletal: She exhibits no edema or tenderness.  Lymphadenopathy:    She has no cervical adenopathy.  Neurological: She is alert and oriented to person, place, and time.  Skin: Skin is warm. No rash noted. No erythema.  Psychiatric: She has a normal mood and affect. Her behavior is normal.    BP 122/64 mmHg  Pulse 87  Temp(Src) 98.8 F (37.1 C) (Oral)  Ht 5' 5.35" (1.66 m)  Wt 194 lb 9.6 oz (88.27 kg)  BMI 32.03 kg/m2  SpO2 98%  LMP 10/06/2011 Wt Readings from Last 3 Encounters:  11/19/14 194 lb 9.6 oz (88.27 kg)  10/08/14 196 lb 8 oz (89.132 kg)  03/15/14 184 lb (83.462 kg)     Lab Results  Component Value Date   WBC 3.7* 09/13/2014   HGB 13.1 09/13/2014   HCT 39.3 09/13/2014   PLT 211 09/13/2014   GLUCOSE 93 09/13/2014   CHOL 198 10/12/2013   TRIG 43.0 10/12/2013   HDL 78.10 10/12/2013   LDLCALC 111* 10/12/2013   ALT 14 10/12/2013   AST 13 10/12/2013   NA 136 09/13/2014   K 3.9 09/13/2014   CL 102 09/13/2014   CREATININE 0.69 09/13/2014   BUN 9 09/13/2014   CO2 27 09/13/2014   TSH 0.54 10/12/2013    Dg Chest 2 View  09/13/2014   CLINICAL DATA:  Chest pain  EXAM: CHEST  2 VIEW  COMPARISON:  03/14/2014  FINDINGS: The heart size and mediastinal contours are within normal limits. Both lungs are clear. The visualized skeletal structures are unremarkable.  IMPRESSION: Negative chest.   Electronically Signed   By: Monte Fantasia M.D.   On: 09/13/2014 12:06         Assessment & Plan:   Problem List Items Addressed This Visit    Barrett's esophagus    Last EGD in 2014.  She is taking dexilant and zantac.  With the swallowing issues as outlined.  Refer back to GI.       Dysphagia    Has a history of Barrett's.  Dysphagia as outlined.  Refer back to GI.       Relevant Orders   Ambulatory referral to Gastroenterology   Enlarged thyroid    Previous ultrasound reveals no nodules.  No change appreciated on exam.  Follow.        Environmental allergies  Receiving injections now.  Follow.       GERD (gastroesophageal reflux disease)   Relevant Medications   dexlansoprazole (DEXILANT) 60 MG capsule   Health care maintenance    Physical today 07/20/14.  Is s/p hysterectomy.  Followed by gyn.  Mammogram 07/31/14 - Birads I.  Colonoscopy 10/2010 - hyperplastic polyps.        History of colonic polyps    Colonoscopy 10/2010 - hyperplastic polyps.        Hypercholesterolemia    Low cholesterol diet and exercise.  Follow lipid panel.        Relevant Orders   Comprehensive metabolic panel   Lipid panel   Leukopenia - Primary    Recheck cbc with next labs.  Has a history of leukopenia and neutrapenia.  HIV and ANA - negative.  TSH, B12 and SIEP al negative.  Was taken off zegrid and aciphex secondary to leukopenia.  Has seen hematology.        Relevant Orders   CBC with Differential/Platelet   TSH       Einar Pheasant, MD

## 2014-11-19 NOTE — Progress Notes (Signed)
Pre visit review using our clinic review tool, if applicable. No additional management support is needed unless otherwise documented below in the visit note. 

## 2014-11-21 ENCOUNTER — Encounter: Payer: Self-pay | Admitting: Internal Medicine

## 2014-11-21 DIAGNOSIS — Z0000 Encounter for general adult medical examination without abnormal findings: Secondary | ICD-10-CM | POA: Insufficient documentation

## 2014-11-21 DIAGNOSIS — Z86010 Personal history of colonic polyps: Secondary | ICD-10-CM | POA: Insufficient documentation

## 2014-11-21 NOTE — Assessment & Plan Note (Signed)
Last EGD in 2014.  She is taking dexilant and zantac.  With the swallowing issues as outlined.  Refer back to GI.

## 2014-11-21 NOTE — Assessment & Plan Note (Signed)
Receiving injections now.  Follow.

## 2014-11-21 NOTE — Assessment & Plan Note (Signed)
Colonoscopy 10/2010 - hyperplastic polyps.   

## 2014-11-21 NOTE — Assessment & Plan Note (Signed)
Low cholesterol diet and exercise.  Follow lipid panel.   

## 2014-11-21 NOTE — Assessment & Plan Note (Signed)
Previous ultrasound reveals no nodules.  No change appreciated on exam.  Follow.

## 2014-11-21 NOTE — Assessment & Plan Note (Signed)
Recheck cbc with next labs.  Has a history of leukopenia and neutrapenia.  HIV and ANA - negative.  TSH, B12 and SIEP al negative.  Was taken off zegrid and aciphex secondary to leukopenia.  Has seen hematology.

## 2014-11-21 NOTE — Assessment & Plan Note (Signed)
Has a history of Barrett's.  Dysphagia as outlined.  Refer back to GI.

## 2014-11-21 NOTE — Assessment & Plan Note (Signed)
Physical today 07/20/14.  Is s/p hysterectomy.  Followed by gyn.  Mammogram 07/31/14 - Birads I.  Colonoscopy 10/2010 - hyperplastic polyps.

## 2015-01-07 ENCOUNTER — Ambulatory Visit (INDEPENDENT_AMBULATORY_CARE_PROVIDER_SITE_OTHER): Payer: 59 | Admitting: Gastroenterology

## 2015-01-07 ENCOUNTER — Encounter: Payer: Self-pay | Admitting: Gastroenterology

## 2015-01-07 VITALS — BP 122/82 | HR 80 | Ht 66.0 in | Wt 197.1 lb

## 2015-01-07 DIAGNOSIS — F458 Other somatoform disorders: Secondary | ICD-10-CM | POA: Diagnosis not present

## 2015-01-07 DIAGNOSIS — K219 Gastro-esophageal reflux disease without esophagitis: Secondary | ICD-10-CM

## 2015-01-07 DIAGNOSIS — R1310 Dysphagia, unspecified: Secondary | ICD-10-CM

## 2015-01-07 DIAGNOSIS — R198 Other specified symptoms and signs involving the digestive system and abdomen: Secondary | ICD-10-CM

## 2015-01-07 DIAGNOSIS — R0989 Other specified symptoms and signs involving the circulatory and respiratory systems: Secondary | ICD-10-CM

## 2015-01-07 NOTE — Patient Instructions (Signed)

## 2015-01-07 NOTE — Progress Notes (Signed)
HPI :  49 y/o female here for follow up for dysphagia and history of shot segment Barrett's, previously seen by Dr. Deatra Ina, last in 2014. Patient has a longstanding history of reflux and dysphagia, with multiple prior EGDs. She has a reported history of short segment nondysplastic Barrett's remotely however on the last 2 EGDs she has not had evidence of this (2014 and 2012). She has had empiric dilations up to 52Fr [facility] in the past which has provided benefit for her dysphagia. Over time her symptoms have recurred.   She is here today for symptoms of dysphagia and globus. She report ongoing globus sensation of something in her throat / neck, this has been ongoing for roughly 2 months or so. She states she has had this in the past and has had an ENT evaluation. She also reports when she is eating she has some dysphagia to solids, not liquids. She reports prior dilation has helped her symptoms for a period of time. She reports sometimes she eats and it "fills up" in her esophagus, has a hard time passing. If she eats too fast she will have to spit back up her food. She tries to push water to help push it down but it comes back up at times. She also has some dysphagia to pills. She is taking her Dexilant every day. She is also taking zantac PRN. She reports her heartburn is well controlled on present dosing of Dexilant.  She reported about a month ago she started having some post prandial vomiting, which has been occuring intermittently, as well as some nausea. This occurs usually in the morning, but can occur at any time. In the past few weeks she has not had trouble with this however. No weight loss.   Endoscopic history: EGD 4/14 - no GEJ, no BE, empiric dilation to 52Fr [place] EGD 7/12 - irregular z-line Colonoscopy 7/12 - left sided benign hyperplastic polyps, no adenomas       Past Medical History  Diagnosis Date  . Hyperlipidemia     ? no meds - diet controlled  . Colon polyps   . HSV  infection     History  . GERD (gastroesophageal reflux disease) 10/12/10    EGD, positive H. pylori  . Barrett's esophagus   . Anemia   . Post-operative nausea and vomiting   . Seasonal allergies   . URI (upper respiratory infection) 03/14/14    rx z-pack 5 days (cough, congestion, ear infection, sore throatx 3  days)  . Arthritis     knees - no meds     Past Surgical History  Procedure Laterality Date  . Shoulder arthroscopy Left   . Dilation and curettage of uterus      SAB  . Abdominal hysterectomy    . Ovarian cyst removal  2004    laparotomy -left  . Esophagogastroduodenoscopy N/A 07/24/2012    Procedure: ESOPHAGOGASTRODUODENOSCOPY (EGD);  Surgeon: Inda Castle, MD;  Location: [facility];  Service: Endoscopy;  Laterality: N/A;  . Bravo ph study N/A 07/24/2012    Procedure: BRAVO [place] STUDY;  Surgeon: Inda Castle, MD;  Location: [facility];  Service: Endoscopy;  Laterality: N/A;  . Balloon dilation N/A 07/24/2012    Procedure: BALLOON DILATION;  Surgeon: Inda Castle, MD;  Location: [facility];  Service: Endoscopy;  Laterality: N/A;  . Wisdom tooth extraction    . Robotic assisted salpingo oopherectomy Left 03/21/2014    Procedure:  ROBOTIC ASSISTED LEFT OOPHORECTOMY;  Surgeon:  Marvene Staff, MD;  Location: [facility];  Service: Gynecology;  Laterality: Left;  . Robotic assisted laparoscopic lysis of adhesion N/A 03/21/2014    Procedure: ROBOTIC ASSISTED LAPAROSCOPIC EXTENSIVE LYSIS OF ADHESION (1 Hour);  Surgeon: Marvene Staff, MD;  Location: [facility];  Service: Gynecology;  Laterality: N/A;   Family History  Problem Relation Age of Onset  . Arthritis Mother   . Breast cancer Maternal Aunt   . Stroke Mother   . Hypertension Mother   . Heart failure Mother   . Colon cancer Neg Hx   . Prostate cancer Maternal Uncle     x 2  . Diabetes Sister   . Diabetes Brother     x 2  . Diabetes Paternal Grandmother   . Hypertension Other   . Hyperlipidemia  Other   . Diabetes Paternal Grandfather   . Lung cancer Father   . Diabetes Maternal Uncle   . Throat cancer Maternal Aunt     Smoker  . Dementia Mother    Social History  Substance Use Topics  . Smoking status: Never Smoker   . Smokeless tobacco: Never Used  . Alcohol Use: No   Current Outpatient Prescriptions  Medication Sig Dispense Refill  . acyclovir (ZOVIRAX) 400 MG tablet Take 1 tablet (400 mg total) by mouth 2 (two) times daily as needed (for outbreaks). 60 tablet 2  . Calcium Carbonate-Vitamin D (CALCIUM 600+D3) 600-400 MG-UNIT per tablet Take 1 tablet by mouth daily.    . Cholecalciferol (VITAMIN D PO) Take 2,000 Int'l Units by mouth daily. Gummy formulation    . dexlansoprazole (DEXILANT) 60 MG capsule TAKE 1 CAPSULE (60 MG TOTAL) BY MOUTH DAILY. 30 capsule 2  . diphenhydrAMINE (BENADRYL) 12.5 MG/5ML elixir Take 12.5 mg by mouth at bedtime as needed for sleep.    . ferrous sulfate 325 (65 FE) MG tablet Take 325 mg by mouth daily.     . mometasone (NASONEX) 50 MCG/ACT nasal spray Place 2 sprays into the nose daily. (Patient taking differently: Place 2 sprays into the nose daily as needed. ) 51 g 3  . ranitidine (ZANTAC) 150 MG tablet Take 300 mg by mouth daily as needed for heartburn.     . vitamin E 400 UNIT capsule Take 400 Units by mouth daily.     No current facility-administered medications for this visit.   Allergies  Allergen Reactions  . Adhesive [Tape] Other (See Comments)    Skin appeared "burned" after last surg.  . Aspirin Nausea And Vomiting  . Ibuprofen Nausea And Vomiting  . Penicillins Diarrhea  . Nsaids Other (See Comments)    GI pain  . Oxycodone Other (See Comments)    Severe abdominal cramps  . Shrimp [Shellfish Allergy] Nausea And Vomiting     Review of Systems: All systems reviewed and negative except where noted in HPI.    Normal CBC and BMP in June 2016, Epic labs reviewed  Physical Exam: BP 122/82 mmHg  Pulse 80  Ht 5\' 6"  (1.676  m)  Wt 197 lb 2 oz (89.415 kg)  BMI 31.83 kg/m2  LMP 10/06/2011 Constitutional: Pleasant,well-developed, female in no acute distress. HEENT: Normocephalic and atraumatic. Conjunctivae are normal. No scleral icterus. Neck supple.  Cardiovascular: Normal rate, regular rhythm.  Pulmonary/chest: Effort normal and breath sounds normal. No wheezing, rales or rhonchi. Abdominal: Soft, nondistended, nontender. Bowel sounds active throughout. There are no masses palpable. No hepatomegaly. Extremities: no edema Lymphadenopathy: No cervical adenopathy noted. Neurological: Alert and  oriented to person place and time. Skin: Skin is warm and dry. No rashes noted. Psychiatric: Normal mood and affect. Behavior is normal.   ASSESSMENT AND PLAN: 49 y/o female, patient of Dr. Deatra Ina, here for reassessment for dysphagia, globus, and history of reflux.   History as outlined above. She has had multiple EGDs with dilation in the past and appears to have benefit from dilations. Last done in 2014 with now recurrence of symptoms for a few months. Last EGD did not show obvious stenosis but she had empiric dilation. Given her history of benefit with dilations I offered her another EGD with dilation. She does not appear to have evidence of EoE. She has never had esophageal manometry and would recommend it after her EGD to ensure no evidence of a motility disorder, which could also explain her globus. She has had globus previously, unclear if related to reflux, motility disorder, or other. Prior ENT evaluation patient endorses was negative. Will await EGD, and plan on referring for manometry if no obvious stenosis. Otherwise, we can evaluate her more recent postprandial complaints as well to ensure no H pylori etc, although suspect she has a component of functional dyspepsia. Regarding her reflux symptoms, her typical heartburn symptoms seem well controlled at present time and think it less likely reflux is causing her  globus. She will continue Dexilant for now, may consider pH testing pending her course. Of note, she has normal renal function on PPI, needs to have this monitored once yearly while on chronic PPI. Regarding her history of BE, this was not noted on last 2 endoscopies. We will evaluate the GEJ as part of this exam but if it continues to be normal she does not need surveillance endoscopy for this issue.   The indications, risks, and benefits of EGD were explained to the patient in detail. Risks include but are not limited to bleeding, perforation, adverse reaction to medications, and cardiopulmonary compromise. Sequelae include but are not limited to the possibility of surgery, hospitalization, and mortality. The patient verbalized understanding and wished to proceed. All questions answered, referred to scheduler. Further recommendations pending results of the exam.   Plainfield Cellar, MD [facility]

## 2015-01-09 ENCOUNTER — Other Ambulatory Visit: Payer: Self-pay

## 2015-01-09 DIAGNOSIS — R1310 Dysphagia, unspecified: Secondary | ICD-10-CM

## 2015-01-09 DIAGNOSIS — K219 Gastro-esophageal reflux disease without esophagitis: Secondary | ICD-10-CM

## 2015-01-21 ENCOUNTER — Ambulatory Visit (AMBULATORY_SURGERY_CENTER): Payer: 59 | Admitting: Gastroenterology

## 2015-01-21 ENCOUNTER — Encounter: Payer: Self-pay | Admitting: Gastroenterology

## 2015-01-21 VITALS — BP 133/95 | HR 76 | Temp 97.6°F | Resp 16 | Ht 66.0 in | Wt 197.0 lb

## 2015-01-21 DIAGNOSIS — K219 Gastro-esophageal reflux disease without esophagitis: Secondary | ICD-10-CM

## 2015-01-21 DIAGNOSIS — R1310 Dysphagia, unspecified: Secondary | ICD-10-CM | POA: Diagnosis present

## 2015-01-21 DIAGNOSIS — R1013 Epigastric pain: Secondary | ICD-10-CM

## 2015-01-21 MED ORDER — SODIUM CHLORIDE 0.9 % IV SOLN: 500.0000 mL | INTRAVENOUS | Status: DC

## 2015-01-21 NOTE — Progress Notes (Signed)
Called to room to assist during endoscopic procedure.  Patient ID and intended procedure confirmed with present staff. Received instructions for my participation in the procedure from the performing physician.  

## 2015-01-21 NOTE — Progress Notes (Signed)
Report to PACU, RN, vss, BBS= Clear.  

## 2015-01-21 NOTE — Op Note (Addendum)
[facility]  [address] [place], [id number]   ENDOSCOPY PROCEDURE REPORT  PATIENT: Kelly Massey, Kelly Massey  MR#: [id number] BIRTHDATE: 17-Apr-1965 , 49  yrs. old GENDER: female ENDOSCOPIST: Yetta Flock, MD REFERRED BY: PROCEDURE DATE:  01/21/2015 PROCEDURE:  EGD w/ balloon dilation and EGD w/ biopsy ASA CLASS:     Class II INDICATIONS:  dysphagia and dyspepsia. MEDICATIONS: Propofol 200 mg IV TOPICAL ANESTHETIC:  DESCRIPTION OF PROCEDURE: After the risks benefits and alternatives of the procedure were thoroughly explained, informed consent was obtained.  The LB [id number] endoscope was introduced through the mouth and advanced to the second portion of the duodenum , Without limitations.  The instrument was slowly withdrawn as the mucosa was fully examined.  FINDINGS: The esophagus was entirely normal without inflammatory changes.  DH, GEJ, and SCJ were located 40cm from the incisors. Biopsies taken of the mid and distal esophagus to rule out eosinophilic esophagitis.  Given the patient's prior positive response to balloon dilation, an 30mm TTS balloon was inflated at the GEJ without resistance and no mucosal wrent was noted.  The 72mm balloon was dragged proximally up the esophagus without resistance and no evidence of stenosis was appreciated.  The stomach was remarkable for a 8mm polyp in the fundus which was removed with cold forceps.  The remainder of the examined stomach was normal without focal ulcerations or erosions.  Random biopsies were taken to rule out H pylori given the patient's dyspepsia.  The duodenal bulb and 2nd portion of the duodenum were otherwise normal.  Retroflexed views revealed no abnormalities.     The scope was then withdrawn from the patient and the procedure completed. Of note, the gastric polyp in the picture noted above is in the fundus, not the GEJ - I was not able to delete "GEJ" from the picture  COMPLICATIONS:  There were no immediate complications.  ENDOSCOPIC IMPRESSION: Normal appearing esophagus. Biopsies obtained to rule out EoE. Empiric dilation performed with 14mm TTS balloon given prior positive response to dilation, in which no resistance was noted and no mucosal wrent appreciated Benign appearing small gastric polyp, removed Normal remainder of examined stomach, biopsies obtained to rule out H pylori. Normal duodenum  RECOMMENDATIONS: Await pathology results and course following dilation Resume diet Resume medications Recommend esophageal manometry to rule out esophageal dysmotility if symptoms of dysphagia persist following dilation and otherwise if without a clear etiology    eSigned:  Yetta Flock, MD 01/21/2015 10:42 AM Revised: 01/21/2015 10:42 AM   CC: the patient  PATIENT NAME:  Kelly Massey, Kelly Massey MR#: [id number]

## 2015-01-21 NOTE — Progress Notes (Signed)
Dental advisory given to patient 

## 2015-01-21 NOTE — Patient Instructions (Signed)
Discharge instructions given. Handouts on a dilatation diet. Resume previous medications. YOU HAD AN ENDOSCOPIC PROCEDURE TODAY AT [facility]:   Refer to the procedure report that was given to you for any specific questions about what was found during the examination.  If the procedure report does not answer your questions, please call your gastroenterologist to clarify.  If you requested that your care partner not be given the details of your procedure findings, then the procedure report has been included in a sealed envelope for you to review at your convenience later.  YOU SHOULD EXPECT: Some feelings of bloating in the abdomen. Passage of more gas than usual.  Walking can help get rid of the air that was put into your GI tract during the procedure and reduce the bloating. If you had a lower endoscopy (such as a colonoscopy or flexible sigmoidoscopy) you may notice spotting of blood in your stool or on the toilet paper. If you underwent a bowel prep for your procedure, you may not have a normal bowel movement for a few days.  Please Note:  You might notice some irritation and congestion in your nose or some drainage.  This is from the oxygen used during your procedure.  There is no need for concern and it should clear up in a day or so.  SYMPTOMS TO REPORT IMMEDIATELY:    Following upper endoscopy (EGD)  Vomiting of blood or coffee ground material  New chest pain or pain under the shoulder blades  Painful or persistently difficult swallowing  New shortness of breath  Fever of 100F or higher  Black, tarry-looking stools  For urgent or emergent issues, a gastroenterologist can be reached at any hour by calling [phone].   DIET: Your first meal following the procedure should be a small meal and then it is ok to progress to your normal diet. Heavy or fried foods are harder to digest and may make you feel nauseous or bloated.  Likewise, meals heavy in dairy and  vegetables can increase bloating.  Drink plenty of fluids but you should avoid alcoholic beverages for 24 hours.  ACTIVITY:  You should plan to take it easy for the rest of today and you should NOT DRIVE or use heavy machinery until tomorrow (because of the sedation medicines used during the test).    FOLLOW UP: Our staff will call the number listed on your records the next business day following your procedure to check on you and address any questions or concerns that you may have regarding the information given to you following your procedure. If we do not reach you, we will leave a message.  However, if you are feeling well and you are not experiencing any problems, there is no need to return our call.  We will assume that you have returned to your regular daily activities without incident.  If any biopsies were taken you will be contacted by phone or by letter within the next 1-3 weeks.  Please call us at [phone] if you have not heard about the biopsies in 3 weeks.    SIGNATURES/CONFIDENTIALITY: You and/or your care partner have signed paperwork which will be entered into your electronic medical record.  These signatures attest to the fact that that the information above on your After Visit Summary has been reviewed and is understood.  Full responsibility of the confidentiality of this discharge information lies with you and/or your care-partner.

## 2015-01-22 ENCOUNTER — Telehealth: Payer: Self-pay

## 2015-01-22 ENCOUNTER — Ambulatory Visit: Payer: 59 | Admitting: Gastroenterology

## 2015-01-22 NOTE — Telephone Encounter (Signed)
Left message on answering machine. 

## 2015-02-10 ENCOUNTER — Other Ambulatory Visit (INDEPENDENT_AMBULATORY_CARE_PROVIDER_SITE_OTHER): Payer: 59

## 2015-02-10 DIAGNOSIS — E7800 Pure hypercholesterolemia, unspecified: Secondary | ICD-10-CM

## 2015-02-10 DIAGNOSIS — D72819 Decreased white blood cell count, unspecified: Secondary | ICD-10-CM

## 2015-02-10 LAB — COMPREHENSIVE METABOLIC PANEL
ALT: 13 U/L (ref 0–35)
AST: 18 U/L (ref 0–37)
Albumin: 4.2 g/dL (ref 3.5–5.2)
Alkaline Phosphatase: 43 U/L (ref 39–117)
BUN: 8 mg/dL (ref 6–23)
CO2: 29 mEq/L (ref 19–32)
Calcium: 9.5 mg/dL (ref 8.4–10.5)
Chloride: 102 mEq/L (ref 96–112)
Creatinine, Ser: 0.73 mg/dL (ref 0.40–1.20)
GFR: 108.69 mL/min (ref >60.00)
Glucose, Bld: 99 mg/dL (ref 70–99)
Potassium: 4.2 mEq/L (ref 3.5–5.1)
Sodium: 138 mEq/L (ref 135–145)
Total Bilirubin: 0.5 mg/dL (ref 0.2–1.2)
Total Protein: 7 g/dL (ref 6.0–8.3)

## 2015-02-10 LAB — LIPID PANEL
Cholesterol: 222 mg/dL — ABNORMAL HIGH (ref 0–200)
HDL: 78.5 mg/dL (ref >39.00)
LDL Cholesterol: 129 mg/dL — ABNORMAL HIGH (ref 0–99)
NonHDL: 143.03
Total CHOL/HDL Ratio: 3
Triglycerides: 68 mg/dL (ref 0.0–149.0)
VLDL: 13.6 mg/dL (ref 0.0–40.0)

## 2015-02-10 LAB — CBC WITH DIFFERENTIAL/PLATELET
Basophils Absolute: 0 10*3/uL (ref 0.0–0.1)
Basophils Relative: 0.7 % (ref 0.0–3.0)
Eosinophils Absolute: 0 10*3/uL (ref 0.0–0.7)
Eosinophils Relative: 0.7 % (ref 0.0–5.0)
HCT: 43.3 % (ref 36.0–46.0)
Hemoglobin: 14.3 g/dL (ref 12.0–15.0)
Lymphocytes Relative: 45 % (ref 12.0–46.0)
Lymphs Abs: 1.8 10*3/uL (ref 0.7–4.0)
MCHC: 33.1 g/dL (ref 30.0–36.0)
MCV: 92.5 fl (ref 78.0–100.0)
Monocytes Absolute: 0.5 10*3/uL (ref 0.1–1.0)
Monocytes Relative: 11.4 % (ref 3.0–12.0)
Neutro Abs: 1.7 10*3/uL (ref 1.4–7.7)
Neutrophils Relative %: 42.2 % — ABNORMAL LOW (ref 43.0–77.0)
Platelets: 238 10*3/uL (ref 150.0–400.0)
RBC: 4.69 Mil/uL (ref 3.87–5.11)
RDW: 14.5 % (ref 11.5–15.5)
WBC: 4 10*3/uL (ref 4.0–10.5)

## 2015-02-10 LAB — TSH: TSH: 0.86 u[IU]/mL (ref 0.35–4.50)

## 2015-02-11 ENCOUNTER — Encounter: Payer: Self-pay | Admitting: *Deleted

## 2015-02-18 ENCOUNTER — Encounter: Payer: Self-pay | Admitting: *Deleted

## 2015-02-24 ENCOUNTER — Ambulatory Visit (INDEPENDENT_AMBULATORY_CARE_PROVIDER_SITE_OTHER): Payer: 59 | Admitting: Internal Medicine

## 2015-02-24 ENCOUNTER — Encounter: Payer: Self-pay | Admitting: Internal Medicine

## 2015-02-24 VITALS — BP 128/80 | HR 94 | Temp 98.3°F | Resp 18 | Ht 66.0 in | Wt 197.0 lb

## 2015-02-24 DIAGNOSIS — R1310 Dysphagia, unspecified: Secondary | ICD-10-CM | POA: Diagnosis not present

## 2015-02-24 DIAGNOSIS — E049 Nontoxic goiter, unspecified: Secondary | ICD-10-CM | POA: Diagnosis not present

## 2015-02-24 DIAGNOSIS — Z9109 Other allergy status, other than to drugs and biological substances: Secondary | ICD-10-CM

## 2015-02-24 DIAGNOSIS — Z86010 Personal history of colonic polyps: Secondary | ICD-10-CM

## 2015-02-24 DIAGNOSIS — Z91048 Other nonmedicinal substance allergy status: Secondary | ICD-10-CM

## 2015-02-24 DIAGNOSIS — K2100 Gastro-esophageal reflux disease with esophagitis, without bleeding: Secondary | ICD-10-CM

## 2015-02-24 DIAGNOSIS — D72819 Decreased white blood cell count, unspecified: Secondary | ICD-10-CM

## 2015-02-24 DIAGNOSIS — E7800 Pure hypercholesterolemia, unspecified: Secondary | ICD-10-CM

## 2015-02-24 MED ORDER — MOMETASONE FUROATE 50 MCG/ACT NA SUSP: 2.0000 | Freq: Every day | NASAL | Status: DC

## 2015-02-24 NOTE — Progress Notes (Signed)
Patient ID: Kelly Massey, female   DOB: 1965/05/04, 49 y.o.   MRN: [id number]   Subjective:    Patient ID: Kelly Massey, female    DOB: 04/13/1965, 49 y.o.   MRN: [id number]  HPI  Patient with past history of GERD, anemia and hypercholesterolemia.  She comes in today to follow up on these issues.  She was recently evaluated by GI.  S/p EGD. S/p dilatation.  Swallowing better.  "Not gagging as bad".  no lump in her throat.  No nausea or vomiting.  Bowels stable.  Saw Dr Percell Miller.  Had right elbow injection.  Better.  Tries to stay active.  No cardiac symptoms with increased activity or exertion.  No sob.  Some allergy symptoms.  Needs refill on her nasonex.     Past Medical History  Diagnosis Date  . Hyperlipidemia     ? no meds - diet controlled  . Colon polyps   . HSV infection     History  . GERD (gastroesophageal reflux disease) 10/12/10    EGD, positive H. pylori  . Barrett's esophagus   . Anemia   . Post-operative nausea and vomiting   . Seasonal allergies   . URI (upper respiratory infection) 03/14/14    rx z-pack 5 days (cough, congestion, ear infection, sore throatx 3  days)  . Arthritis     knees - no meds   Past Surgical History  Procedure Laterality Date  . Shoulder arthroscopy Left   . Dilation and curettage of uterus      SAB  . Abdominal hysterectomy    . Ovarian cyst removal  2004    laparotomy -left  . Esophagogastroduodenoscopy N/A 07/24/2012    Procedure: ESOPHAGOGASTRODUODENOSCOPY (EGD);  Surgeon: Inda Castle, MD;  Location: [facility];  Service: Endoscopy;  Laterality: N/A;  . Bravo ph study N/A 07/24/2012    Procedure: BRAVO [place] STUDY;  Surgeon: Inda Castle, MD;  Location: [facility];  Service: Endoscopy;  Laterality: N/A;  . Balloon dilation N/A 07/24/2012    Procedure: BALLOON DILATION;  Surgeon: Inda Castle, MD;  Location: [facility];  Service: Endoscopy;  Laterality: N/A;  . Wisdom tooth extraction    . Robotic assisted salpingo  oopherectomy Left 03/21/2014    Procedure:  ROBOTIC ASSISTED LEFT OOPHORECTOMY;  Surgeon: Marvene Staff, MD;  Location: [facility];  Service: Gynecology;  Laterality: Left;  . Robotic assisted laparoscopic lysis of adhesion N/A 03/21/2014    Procedure: ROBOTIC ASSISTED LAPAROSCOPIC EXTENSIVE LYSIS OF ADHESION (1 Hour);  Surgeon: Marvene Staff, MD;  Location: [facility];  Service: Gynecology;  Laterality: N/A;   Family History  Problem Relation Age of Onset  . Arthritis Mother   . Breast cancer Maternal Aunt   . Stroke Mother   . Hypertension Mother   . Heart failure Mother   . Colon cancer Neg Hx   . Prostate cancer Maternal Uncle     x 2  . Diabetes Sister   . Diabetes Brother     x 2  . Diabetes Paternal Grandmother   . Hypertension Other   . Hyperlipidemia Other   . Diabetes Paternal Grandfather   . Lung cancer Father   . Diabetes Maternal Uncle   . Throat cancer Maternal Aunt     Smoker  . Dementia Mother    Social History   Social History  . Marital Status: Married    Spouse Name: N/A  . Number of Children: 0  .  Years of Education: N/A   Occupational History  . RESEARCH [employer]   Social History Main Topics  . Smoking status: Never Smoker   . Smokeless tobacco: Never Used  . Alcohol Use: No  . Drug Use: No  . Sexual Activity: Yes    Birth Control/ Protection: None     Comment: hysterectomy   Other Topics Concern  . None   Social History Narrative    Outpatient Encounter Prescriptions as of 02/24/2015  Medication Sig  . acyclovir (ZOVIRAX) 400 MG tablet Take 1 tablet (400 mg total) by mouth 2 (two) times daily as needed (for outbreaks).  . Calcium Carbonate-Vitamin D (CALCIUM 600+D3) 600-400 MG-UNIT per tablet Take 1 tablet by mouth daily.  . Cholecalciferol (VITAMIN D PO) Take 2,000 Int'l Units by mouth daily. Gummy formulation  . dexlansoprazole (DEXILANT) 60 MG capsule TAKE 1 CAPSULE (60 MG TOTAL) BY MOUTH DAILY.  . diphenhydrAMINE  (BENADRYL) 12.5 MG/5ML elixir Take 12.5 mg by mouth at bedtime as needed for sleep.  . ferrous sulfate 325 (65 FE) MG tablet Take 325 mg by mouth daily.   . fluocinonide ointment (LIDEX) 0.05 % APPLY TO AFFECTED AREA OF THE SCALP 3-4 TIMES A WEEKLY  . mometasone (NASONEX) 50 MCG/ACT nasal spray Place 2 sprays into the nose daily.  . ranitidine (ZANTAC) 150 MG tablet Take 300 mg by mouth daily as needed for heartburn.   . vitamin E 400 UNIT capsule Take 400 Units by mouth daily.  . [DISCONTINUED] mometasone (NASONEX) 50 MCG/ACT nasal spray Place 2 sprays into the nose daily.   No facility-administered encounter medications on file as of 02/24/2015.    Review of Systems  Constitutional: Negative for appetite change and unexpected weight change.  HENT: Negative for sinus pressure.        Some allergy symptoms.    Respiratory: Negative for cough, chest tightness and shortness of breath.   Cardiovascular: Negative for chest pain, palpitations and leg swelling.  Gastrointestinal: Negative for nausea, vomiting, abdominal pain and diarrhea.       No lump in her throat since the dilatation.    Genitourinary: Negative for dysuria and difficulty urinating.  Musculoskeletal: Negative for back pain and joint swelling.       Right elbow pain as outlined.   Skin: Negative for color change and rash.  Neurological: Negative for dizziness, light-headedness and headaches.  Psychiatric/Behavioral: Negative for dysphoric mood and agitation.       Objective:    Physical Exam  Constitutional: She appears well-developed and well-nourished. No distress.  HENT:  Nose: Nose normal.  Mouth/Throat: Oropharynx is clear and moist.  Eyes: Conjunctivae are normal. Right eye exhibits no discharge. Left eye exhibits no discharge.  Neck: Neck supple. No thyromegaly present.  Cardiovascular: Normal rate and regular rhythm.   Pulmonary/Chest: Breath sounds normal. No respiratory distress. She has no wheezes.    Abdominal: Soft. Bowel sounds are normal. There is no tenderness.  Musculoskeletal: She exhibits no edema or tenderness.  Lymphadenopathy:    She has no cervical adenopathy.  Skin: No rash noted. No erythema.  Psychiatric: She has a normal mood and affect. Her behavior is normal.    BP 128/80 mmHg  Pulse 94  Temp(Src) 98.3 F (36.8 C) (Oral)  Resp 18  Ht 5\' 6"  (1.676 m)  Wt 197 lb (89.359 kg)  BMI 31.81 kg/m2  SpO2 98%  LMP 10/06/2011 Wt Readings from Last 3 Encounters:  02/24/15 197 lb (89.359 kg)  01/21/15 197  lb (89.359 kg)  01/07/15 197 lb 2 oz (89.415 kg)     Lab Results  Component Value Date   WBC 4.0 02/10/2015   HGB 14.3 02/10/2015   HCT 43.3 02/10/2015   PLT 238.0 02/10/2015   GLUCOSE 99 02/10/2015   CHOL 222* 02/10/2015   TRIG 68.0 02/10/2015   HDL 78.50 02/10/2015   LDLCALC 129* 02/10/2015   ALT 13 02/10/2015   AST 18 02/10/2015   NA 138 02/10/2015   K 4.2 02/10/2015   CL 102 02/10/2015   CREATININE 0.73 02/10/2015   BUN 8 02/10/2015   CO2 29 02/10/2015   TSH 0.86 02/10/2015    Dg Chest 2 View  09/13/2014  CLINICAL DATA:  Chest pain EXAM: CHEST  2 VIEW COMPARISON:  03/14/2014 FINDINGS: The heart size and mediastinal contours are within normal limits. Both lungs are clear. The visualized skeletal structures are unremarkable. IMPRESSION: Negative chest. Electronically Signed   By: Monte Fantasia M.D.   On: 09/13/2014 12:06       Assessment & Plan:   Problem List Items Addressed This Visit    Dysphagia    Swallowing improved after EGD/dilatation.  Continue dexilant.  Follow.        Enlarged thyroid    Previous ultrasound revealed no nodules.  Follow thyroid function tests.        Environmental allergies    Has been receiving allergy injections.  Refilled nasonex.  Restart.        GERD (gastroesophageal reflux disease) - Primary    Has a history of Barretts.  EGD just performed.  See report.  S/p dilatation.  Continue dexilant.  No lump in  her throat now.  Follow.        History of colonic polyps    Colonoscopy 10/2010 - hyperplastic polyps.        Hypercholesterolemia    Low cholesterol diet and exercise.  Follow lipid panel.  LDL 129 on recent check.  Hold on medication.  Discussed diet and exercise.  Follow.        Leukopenia    Last white count wnl.  HIV and ANA negative.  TSH, B12 and SIEP negative.  Has seen hematology.  Follow cbc.           Einar Pheasant, MD

## 2015-02-24 NOTE — Patient Instructions (Signed)
Saline nasal spray - flush nose at least 2-3x/day  nasonex nasal spray - 2 sprays each nostril one time per day.  Do this in the evening.

## 2015-02-24 NOTE — Progress Notes (Signed)
Pre-visit discussion using our clinic review tool. No additional management support is needed unless otherwise documented below in the visit note.  

## 2015-03-02 ENCOUNTER — Encounter: Payer: Self-pay | Admitting: Internal Medicine

## 2015-03-02 NOTE — Assessment & Plan Note (Signed)
Colonoscopy 10/2010 - hyperplastic polyps.   

## 2015-03-02 NOTE — Assessment & Plan Note (Signed)
Last white count wnl.  HIV and ANA negative.  TSH, B12 and SIEP negative.  Has seen hematology.  Follow cbc.

## 2015-03-02 NOTE — Assessment & Plan Note (Signed)
Has a history of Barretts.  EGD just performed.  See report.  S/p dilatation.  Continue dexilant.  No lump in her throat now.  Follow.

## 2015-03-02 NOTE — Assessment & Plan Note (Signed)
Previous ultrasound revealed no nodules.  Follow thyroid function tests.

## 2015-03-02 NOTE — Assessment & Plan Note (Signed)
Swallowing improved after EGD/dilatation.  Continue dexilant.  Follow.

## 2015-03-02 NOTE — Assessment & Plan Note (Signed)
Has been receiving allergy injections.  Refilled nasonex.  Restart.

## 2015-03-02 NOTE — Assessment & Plan Note (Addendum)
Low cholesterol diet and exercise.  Follow lipid panel.  LDL 129 on recent check.  Hold on medication.  Discussed diet and exercise.  Follow.

## 2015-03-22 ENCOUNTER — Other Ambulatory Visit: Payer: Self-pay | Admitting: Internal Medicine

## 2015-03-24 NOTE — Telephone Encounter (Signed)
Patient called has not received medication at pharmacy.

## 2015-04-06 HISTORY — PX: OTHER SURGICAL HISTORY: SHX169

## 2015-04-17 ENCOUNTER — Telehealth: Payer: Self-pay | Admitting: Internal Medicine

## 2015-04-17 NOTE — Telephone Encounter (Signed)
Pt called wanting to know when she had her physical appts. I gave her the information for 11/19/2014. I did not see any other prior. Call pt @ [phone]. Thank You!

## 2015-04-17 NOTE — Telephone Encounter (Signed)
That is correct, do I need to call her?

## 2015-04-17 NOTE — Telephone Encounter (Signed)
Yes, she had a follow up, but the physical was the 8/16 date.

## 2015-04-17 NOTE — Telephone Encounter (Signed)
So that was the only date for physical that she had?

## 2015-04-17 NOTE — Telephone Encounter (Signed)
I called pt back and gave her that message about her physical date. Thank You!

## 2015-04-21 ENCOUNTER — Other Ambulatory Visit: Payer: Self-pay | Admitting: Physician Assistant

## 2015-04-21 NOTE — H&P (Signed)
  Kelly Massey comes in for Kelly Massey right elbow.  Continues to have significant pain right off the lateral epicondyle.  Pain with resisted use of Kelly Massey extensors and some referred pain down the extensors, really not sounding like radial tunnel.  This has now been going on for almost six months.  MRI showing tendinosis with a partial tear of the deep common extensor tendon.  I went over the scan and the report and discussed it with Kelly Massey.  EXAMINATION: Lungs clear to auscultation bilaterally.  Heart sounds normal.  Specifically, point tender right at the epicondyle.  Not getting much over the radial tunnel.  Full motion, stable elbow otherwise.    DISPOSITION:  This is now getting more and more longstanding.  Only transient improvement with injection, but it didn't help dramatically for about a week.  Spent more than 25 minutes face-to-face covering options.  She would like to proceed with definitive treatment.  At the end of the day I don't think it is going to improve until it is repaired.  I don't think we have to do anything with the radial tunnel.  We have discussed lateral exploration, debridement and repair of the common extensor tendons.  Procedure, risks, benefits and complications reviewed.  Paperwork complete.  All questions answered.  Anticipated postoperative course outlined as well.  We will proceed and I will see Kelly Massey at the time of operative intervention.    [name], M.D.

## 2015-04-24 ENCOUNTER — Encounter (HOSPITAL_BASED_OUTPATIENT_CLINIC_OR_DEPARTMENT_OTHER): Payer: Self-pay | Admitting: *Deleted

## 2015-05-01 ENCOUNTER — Ambulatory Visit (HOSPITAL_BASED_OUTPATIENT_CLINIC_OR_DEPARTMENT_OTHER): Payer: 59 | Admitting: Anesthesiology

## 2015-05-01 ENCOUNTER — Ambulatory Visit (HOSPITAL_BASED_OUTPATIENT_CLINIC_OR_DEPARTMENT_OTHER)
Admission: RE | Admit: 2015-05-01 | Discharge: 2015-05-01 | Disposition: A | Discharge status: Home / Self Care | Payer: 59 | Source: Ambulatory Visit | Attending: Orthopedic Surgery | Admitting: Orthopedic Surgery

## 2015-05-01 ENCOUNTER — Encounter (HOSPITAL_BASED_OUTPATIENT_CLINIC_OR_DEPARTMENT_OTHER)
Admission: RE | Disposition: A | Discharge status: Home / Self Care | Payer: Self-pay | Source: Ambulatory Visit | Attending: Orthopedic Surgery

## 2015-05-01 ENCOUNTER — Encounter (HOSPITAL_BASED_OUTPATIENT_CLINIC_OR_DEPARTMENT_OTHER): Payer: Self-pay | Admitting: *Deleted

## 2015-05-01 DIAGNOSIS — S56511A Strain of other extensor muscle, fascia and tendon at forearm level, right arm, initial encounter: Secondary | ICD-10-CM | POA: Insufficient documentation

## 2015-05-01 DIAGNOSIS — M7711 Lateral epicondylitis, right elbow: Secondary | ICD-10-CM | POA: Diagnosis not present

## 2015-05-01 DIAGNOSIS — X58XXXA Exposure to other specified factors, initial encounter: Secondary | ICD-10-CM | POA: Insufficient documentation

## 2015-05-01 HISTORY — PX: TENNIS ELBOW RELEASE/NIRSCHEL PROCEDURE: SHX6651

## 2015-05-01 SURGERY — TENNIS ELBOW RELEASE/NIRSCHEL PROCEDURE
Anesthesia: General | Site: Elbow | Laterality: Right

## 2015-05-01 MED ORDER — DEXAMETHASONE SODIUM PHOSPHATE 4 MG/ML IJ SOLN
INTRAMUSCULAR | Status: DC | PRN
  Administered 2015-05-01: 10 mg via INTRAVENOUS

## 2015-05-01 MED ORDER — CLINDAMYCIN PHOSPHATE 900 MG/50ML IV SOLN
900.0000 mg | INTRAVENOUS | Status: AC
  Administered 2015-05-01: 900 mg via INTRAVENOUS

## 2015-05-01 MED ORDER — HYDROCODONE-ACETAMINOPHEN 5-325 MG PO TABS
1.0000 | ORAL_TABLET | Freq: Once | ORAL | Status: AC | PRN
  Administered 2015-05-01: 1 via ORAL

## 2015-05-01 MED ORDER — SCOPOLAMINE 1 MG/3DAYS TD PT72
1.0000 | MEDICATED_PATCH | Freq: Once | TRANSDERMAL | Status: DC | PRN
  Administered 2015-05-01: 1.5 mg via TRANSDERMAL

## 2015-05-01 MED ORDER — FENTANYL CITRATE (PF) 100 MCG/2ML IJ SOLN
INTRAMUSCULAR | Status: AC
  Filled 2015-05-01: qty 2

## 2015-05-01 MED ORDER — HYDROMORPHONE HCL 1 MG/ML IJ SOLN
0.2500 mg | INTRAMUSCULAR | Status: DC | PRN
  Administered 2015-05-01: 0.5 mg via INTRAVENOUS

## 2015-05-01 MED ORDER — PROPOFOL 10 MG/ML IV BOLUS
INTRAVENOUS | Status: DC | PRN
Start: 2015-05-01 — End: 2015-05-01
  Administered 2015-05-01: 200 mg via INTRAVENOUS

## 2015-05-01 MED ORDER — CHLORHEXIDINE GLUCONATE 4 % EX LIQD: 60.0000 mL | Freq: Once | CUTANEOUS | Status: DC

## 2015-05-01 MED ORDER — GLYCOPYRROLATE 0.2 MG/ML IJ SOLN: 0.2000 mg | Freq: Once | INTRAMUSCULAR | Status: DC | PRN

## 2015-05-01 MED ORDER — PROPOFOL 500 MG/50ML IV EMUL
INTRAVENOUS | Status: AC
  Filled 2015-05-01: qty 50

## 2015-05-01 MED ORDER — LACTATED RINGERS IV SOLN: 500.0000 mL | INTRAVENOUS | Status: DC

## 2015-05-01 MED ORDER — LIDOCAINE HCL (CARDIAC) 20 MG/ML IV SOLN
INTRAVENOUS | Status: DC | PRN
  Administered 2015-05-01: 50 mg via INTRAVENOUS

## 2015-05-01 MED ORDER — HYDROCODONE-ACETAMINOPHEN 10-325 MG PO TABS: 1.0000 | ORAL_TABLET | ORAL | Status: DC | PRN

## 2015-05-01 MED ORDER — DEXAMETHASONE SODIUM PHOSPHATE 10 MG/ML IJ SOLN
INTRAMUSCULAR | Status: AC
  Filled 2015-05-01: qty 1

## 2015-05-01 MED ORDER — SCOPOLAMINE 1 MG/3DAYS TD PT72
MEDICATED_PATCH | TRANSDERMAL | Status: AC
  Filled 2015-05-01: qty 1

## 2015-05-01 MED ORDER — OXYCODONE HCL 5 MG PO TABS: 5.0000 mg | ORAL_TABLET | Freq: Once | ORAL | Status: DC | PRN

## 2015-05-01 MED ORDER — CLINDAMYCIN PHOSPHATE 900 MG/50ML IV SOLN
INTRAVENOUS | Status: AC
  Filled 2015-05-01: qty 50

## 2015-05-01 MED ORDER — ONDANSETRON HCL 4 MG/2ML IJ SOLN
INTRAMUSCULAR | Status: AC
  Filled 2015-05-01: qty 2

## 2015-05-01 MED ORDER — LIDOCAINE HCL (CARDIAC) 20 MG/ML IV SOLN
INTRAVENOUS | Status: AC
  Filled 2015-05-01: qty 5

## 2015-05-01 MED ORDER — ONDANSETRON HCL 4 MG PO TABS: 4.0000 mg | ORAL_TABLET | Freq: Three times a day (TID) | ORAL | Status: DC | PRN

## 2015-05-01 MED ORDER — MIDAZOLAM HCL 2 MG/2ML IJ SOLN
1.0000 mg | INTRAMUSCULAR | Status: DC | PRN
  Administered 2015-05-01: 2 mg via INTRAVENOUS

## 2015-05-01 MED ORDER — OXYCODONE HCL 5 MG/5ML PO SOLN: 5.0000 mg | Freq: Once | ORAL | Status: DC | PRN

## 2015-05-01 MED ORDER — MIDAZOLAM HCL 2 MG/2ML IJ SOLN
INTRAMUSCULAR | Status: AC
  Filled 2015-05-01: qty 2

## 2015-05-01 MED ORDER — LACTATED RINGERS IV SOLN
INTRAVENOUS | Status: DC
  Administered 2015-05-01: 09:00:00 via INTRAVENOUS

## 2015-05-01 MED ORDER — LACTATED RINGERS IV SOLN: INTRAVENOUS | Status: DC

## 2015-05-01 MED ORDER — BUPIVACAINE HCL 0.5 % IJ SOLN
INTRAMUSCULAR | Status: DC | PRN
  Administered 2015-05-01: 10 mL

## 2015-05-01 MED ORDER — HYDROCODONE-ACETAMINOPHEN 5-325 MG PO TABS
ORAL_TABLET | ORAL | Status: AC
  Filled 2015-05-01: qty 1

## 2015-05-01 MED ORDER — MEPERIDINE HCL 25 MG/ML IJ SOLN: 6.2500 mg | INTRAMUSCULAR | Status: DC | PRN

## 2015-05-01 MED ORDER — ONDANSETRON HCL 4 MG/2ML IJ SOLN
INTRAMUSCULAR | Status: DC | PRN
  Administered 2015-05-01: 4 mg via INTRAVENOUS

## 2015-05-01 MED ORDER — FENTANYL CITRATE (PF) 100 MCG/2ML IJ SOLN
50.0000 ug | INTRAMUSCULAR | Status: DC | PRN
  Administered 2015-05-01: 50 ug via INTRAVENOUS

## 2015-05-01 MED ORDER — HYDROMORPHONE HCL 1 MG/ML IJ SOLN
INTRAMUSCULAR | Status: AC
  Filled 2015-05-01: qty 1

## 2015-05-01 SURGICAL SUPPLY — 61 items
APL SKNCLS STERI-STRIP NONHPOA (GAUZE/BANDAGES/DRESSINGS)
BANDAGE ACE 4X5 VEL STRL LF (GAUZE/BANDAGES/DRESSINGS) ×4 IMPLANT
BENZOIN TINCTURE PRP APPL 2/3 (GAUZE/BANDAGES/DRESSINGS) IMPLANT
BLADE SURG 15 STRL LF DISP TIS (BLADE) ×1 IMPLANT
BLADE SURG 15 STRL SS (BLADE) ×1
BNDG CMPR 9X4 STRL LF SNTH (GAUZE/BANDAGES/DRESSINGS) ×1
BNDG COHESIVE 4X5 TAN STRL (GAUZE/BANDAGES/DRESSINGS) ×2 IMPLANT
BNDG ESMARK 4X9 LF (GAUZE/BANDAGES/DRESSINGS) ×2 IMPLANT
CANISTER SUCT 1200ML W/VALVE (MISCELLANEOUS) ×2 IMPLANT
COVER BACK TABLE 60X90IN (DRAPES) ×2 IMPLANT
CUFF TOURN SGL LL 18 NRW (TOURNIQUET CUFF) ×4 IMPLANT
CUFF TOURNIQUET SINGLE 18IN (TOURNIQUET CUFF) ×2 IMPLANT
DECANTER SPIKE VIAL GLASS SM (MISCELLANEOUS) IMPLANT
DRAPE EXTREMITY T 121X128X90 (DRAPE) ×2 IMPLANT
DRAPE U 20/CS (DRAPES) ×2 IMPLANT
DRAPE U-SHAPE 47X51 STRL (DRAPES) ×2 IMPLANT
DRSG PAD ABDOMINAL 8X10 ST (GAUZE/BANDAGES/DRESSINGS) ×2 IMPLANT
DURAPREP 26ML APPLICATOR (WOUND CARE) ×2 IMPLANT
ELECT REM PT RETURN 9FT ADLT (ELECTROSURGICAL) ×2
ELECTRODE REM PT RTRN 9FT ADLT (ELECTROSURGICAL) ×1 IMPLANT
GAUZE SPONGE 4X4 12PLY STRL (GAUZE/BANDAGES/DRESSINGS) ×2 IMPLANT
GAUZE XEROFORM 1X8 LF (GAUZE/BANDAGES/DRESSINGS) IMPLANT
GLOVE BIO SURGEON STRL SZ 6.5 (GLOVE) ×2 IMPLANT
GLOVE BIOGEL PI IND STRL 7.0 (GLOVE) ×2 IMPLANT
GLOVE BIOGEL PI IND STRL 8 (GLOVE) ×1 IMPLANT
GLOVE BIOGEL PI INDICATOR 7.0 (GLOVE) ×2
GLOVE BIOGEL PI INDICATOR 8 (GLOVE) ×1
GLOVE ECLIPSE 7.0 STRL STRAW (GLOVE) ×2 IMPLANT
GLOVE SURG ORTHO 8.0 STRL STRW (GLOVE) ×2 IMPLANT
GOWN STRL REUS W/ TWL LRG LVL3 (GOWN DISPOSABLE) ×2 IMPLANT
GOWN STRL REUS W/ TWL XL LVL3 (GOWN DISPOSABLE) ×1 IMPLANT
GOWN STRL REUS W/TWL LRG LVL3 (GOWN DISPOSABLE) ×4
GOWN STRL REUS W/TWL XL LVL3 (GOWN DISPOSABLE) ×4 IMPLANT
NEEDLE 1/2 CIR CATGUT .05X1.09 (NEEDLE) IMPLANT
NEEDLE HYPO 25X1 1.5 SAFETY (NEEDLE) ×2 IMPLANT
NS IRRIG 1000ML POUR BTL (IV SOLUTION) ×2 IMPLANT
PACK BASIN DAY SURGERY FS (CUSTOM PROCEDURE TRAY) ×2 IMPLANT
PAD CAST 4YDX4 CTTN HI CHSV (CAST SUPPLIES) ×3 IMPLANT
PADDING CAST ABS 4INX4YD NS (CAST SUPPLIES) ×1
PADDING CAST ABS COTTON 4X4 ST (CAST SUPPLIES) ×1 IMPLANT
PADDING CAST COTTON 4X4 STRL (CAST SUPPLIES) ×6
PENCIL BUTTON HOLSTER BLD 10FT (ELECTRODE) ×2 IMPLANT
SLEEVE SCD COMPRESS KNEE MED (MISCELLANEOUS) IMPLANT
SLING ARM FOAM STRAP LRG (SOFTGOODS) IMPLANT
SLING ARM MED ADULT FOAM STRAP (SOFTGOODS) IMPLANT
STOCKINETTE 4X48 STRL (DRAPES) ×2 IMPLANT
STRIP CLOSURE SKIN 1/2X4 (GAUZE/BANDAGES/DRESSINGS) IMPLANT
SUCTION FRAZIER HANDLE 10FR (MISCELLANEOUS) ×1
SUCTION TUBE FRAZIER 10FR DISP (MISCELLANEOUS) ×1 IMPLANT
SUT FIBERWIRE #2 38 T-5 BLUE (SUTURE) ×2
SUT VIC AB 0 CT1 27 (SUTURE) ×2
SUT VIC AB 0 CT1 27XBRD ANBCTR (SUTURE) ×1 IMPLANT
SUT VIC AB 2-0 SH 27 (SUTURE) ×2
SUT VIC AB 2-0 SH 27XBRD (SUTURE) ×1 IMPLANT
SUT VICRYL 3-0 CR8 SH (SUTURE) IMPLANT
SUTURE FIBERWR #2 38 T-5 BLUE (SUTURE) ×1 IMPLANT
SYR BULB 3OZ (MISCELLANEOUS) ×2 IMPLANT
TOWEL OR 17X24 6PK STRL BLUE (TOWEL DISPOSABLE) ×2 IMPLANT
TOWEL OR NON WOVEN STRL DISP B (DISPOSABLE) ×2 IMPLANT
TUBE CONNECTING 20X1/4 (TUBING) ×2 IMPLANT
UNDERPAD 30X30 (UNDERPADS AND DIAPERS) ×2 IMPLANT

## 2015-05-01 NOTE — Interval H&P Note (Signed)
History and Physical Interval Note:  05/01/2015 7:43 AM  Kelly Massey  has presented today for surgery, with the diagnosis of LATERAL EPICONDYLITIS RIGHT ELBOW   The various methods of treatment have been discussed with the patient and family. After consideration of risks, benefits and other options for treatment, the patient has consented to  Procedure(s): RIGHT ELBOW DEBRIDEMENT AND TENDON REPAIR (Right) as a surgical intervention .  The patient's history has been reviewed, patient examined, no change in status, stable for surgery.  I have reviewed the patient's chart and labs.  Questions were answered to the patient's satisfaction.     [name]

## 2015-05-01 NOTE — Discharge Instructions (Signed)
Wear sling for comfort.  Do not remove bandage.  Do not get bandage wet.  May apply ice for up to 20 min at a time for pain and swelling.  Follow up appointment in one week.   Post Anesthesia Home Care Instructions  Activity: Get plenty of rest for the remainder of the day. A responsible adult should stay with you for 24 hours following the procedure.  For the next 24 hours, DO NOT: -Drive a car -Paediatric nurse -Drink alcoholic beverages -Take any medication unless instructed by your physician -Make any legal decisions or sign important papers.  Meals: Start with liquid foods such as gelatin or soup. Progress to regular foods as tolerated. Avoid greasy, spicy, heavy foods. If nausea and/or vomiting occur, drink only clear liquids until the nausea and/or vomiting subsides. Call your physician if vomiting continues.  Special Instructions/Symptoms: Your throat may feel dry or sore from the anesthesia or the breathing tube placed in your throat during surgery. If this causes discomfort, gargle with warm salt water. The discomfort should disappear within 24 hours.  If you had a scopolamine patch placed behind your ear for the management of post- operative nausea and/or vomiting:  1. The medication in the patch is effective for 72 hours, after which it should be removed.  Wrap patch in a tissue and discard in the trash. Wash hands thoroughly with soap and water. 2. You may remove the patch earlier than 72 hours if you experience unpleasant side effects which may include dry mouth, dizziness or visual disturbances. 3. Avoid touching the patch. Wash your hands with soap and water after contact with the patch.

## 2015-05-01 NOTE — Anesthesia Procedure Notes (Signed)
Procedure Name: LMA Insertion Performed by: Addalee Kavanagh W Pre-anesthesia Checklist: Patient identified, Emergency Drugs available, Suction available and Patient being monitored Patient Re-evaluated:Patient Re-evaluated prior to inductionOxygen Delivery Method: Circle System Utilized Preoxygenation: Pre-oxygenation with 100% oxygen Intubation Type: IV induction Ventilation: Mask ventilation without difficulty LMA: LMA inserted LMA Size: 4.0 Number of attempts: 1 Airway Equipment and Method: Bite block Placement Confirmation: positive ETCO2 Tube secured with: Tape Dental Injury: Teeth and Oropharynx as per pre-operative assessment      

## 2015-05-01 NOTE — Anesthesia Postprocedure Evaluation (Signed)
Anesthesia Post Note  Patient: Kelly Massey  Procedure(s) Performed: Procedure(s) (LRB): RIGHT ELBOW DEBRIDEMENT AND TENDON REPAIR (Right)  Patient location during evaluation: PACU Anesthesia Type: General Level of consciousness: awake and alert Pain management: pain level controlled Vital Signs Assessment: post-procedure vital signs reviewed and stable Respiratory status: spontaneous breathing, nonlabored ventilation and respiratory function stable Cardiovascular status: blood pressure returned to baseline and stable Postop Assessment: no signs of nausea or vomiting Anesthetic complications: no    Last Vitals:  Filed Vitals:   05/01/15 1115 05/01/15 1130  BP: 127/77   Pulse: 81 80  Temp:    Resp: 21 23    Last Pain:  Filed Vitals:   05/01/15 1132  PainSc: 5                  [name] A

## 2015-05-01 NOTE — Anesthesia Preprocedure Evaluation (Signed)
Anesthesia Evaluation  Patient identified by MRN, date of birth, ID band Patient awake    Reviewed: Allergy & Precautions, NPO status , Patient's Chart, lab work & pertinent test results  History of Anesthesia Complications (+) PONV  Airway Mallampati: I   Neck ROM: Full    Dental  (+) Teeth Intact, Dental Advisory Given   Pulmonary    breath sounds clear to auscultation       Cardiovascular  Rhythm:Regular Rate:Normal     Neuro/Psych    GI/Hepatic GERD  Medicated and Controlled,  Endo/Other    Renal/GU      Musculoskeletal   Abdominal   Peds  Hematology   Anesthesia Other Findings   Reproductive/Obstetrics                             Anesthesia Physical Anesthesia Plan  ASA: II  Anesthesia Plan: General   Post-op Pain Management:    Induction: Intravenous  Airway Management Planned: LMA  Additional Equipment:   Intra-op Plan:   Post-operative Plan: Extubation in OR  Informed Consent: I have reviewed the patients History and Physical, chart, labs and discussed the procedure including the risks, benefits and alternatives for the proposed anesthesia with the patient or authorized representative who has indicated his/her understanding and acceptance.   Dental advisory given  Plan Discussed with: CRNA, Anesthesiologist and Surgeon  Anesthesia Plan Comments:         Anesthesia Quick Evaluation

## 2015-05-01 NOTE — Transfer of Care (Signed)
Immediate Anesthesia Transfer of Care Note  Patient: Kelly Massey  Procedure(s) Performed: Procedure(s): RIGHT ELBOW DEBRIDEMENT AND TENDON REPAIR (Right)  Patient Location: PACU  Anesthesia Type:General  Level of Consciousness: awake and sedated  Airway & Oxygen Therapy: Patient Spontanous Breathing and Patient connected to face mask oxygen  Post-op Assessment: Report given to RN and Post -op Vital signs reviewed and stable  Post vital signs: Reviewed and stable  Last Vitals:  Filed Vitals:   05/01/15 0822  BP: 149/81  Pulse: 73  Temp: 36.5 C  Resp: 20    Complications: No apparent anesthesia complications

## 2015-05-02 ENCOUNTER — Encounter (HOSPITAL_BASED_OUTPATIENT_CLINIC_OR_DEPARTMENT_OTHER): Payer: Self-pay | Admitting: Orthopedic Surgery

## 2015-05-02 NOTE — Op Note (Signed)
NAME:  Kelly Massey, Kelly Massey NO.:  [id number]  MEDICAL RECORD NO.:  [id number]  LOCATION:                                 FACILITY:  PHYSICIAN:  [name], M.D. DATE OF BIRTH:  11-Jul-1965  DATE OF PROCEDURE:  05/01/2015 DATE OF DISCHARGE:                              OPERATIVE REPORT   PREOPERATIVE DIAGNOSIS:  Chronic lateral epicondylitis, right elbow, with tearing extensor carpi radialis brevis (ECRB) tendon.  POSTOPERATIVE DIAGNOSIS:  Chronic lateral epicondylitis, right elbow, with tearing extensor carpi radialis brevis (ECRB) tendon.  PROCEDURE:  Right elbow exploration.  Extensive debridement, ECRB tendon.  Debridement drilling, lateral epicondyle.  Repair of superficial extensors over defect.  SURGEON:  [name], M.D.  ASSISTANT:  Elmyra Ricks, PA.  ANESTHESIA:  General.  BLOOD LOSS:  Minimal.  SPECIMENS:  None.  COMPLICATIONS:  None.  DRESSINGS:  Soft compressive with a sugar-tong splint.  TOURNIQUET TIME:  45 minutes.  DESCRIPTION OF PROCEDURE:  The patient was brought to the operating room, placed on operating table in supine position.  After adequate anesthesia had been obtained, tourniquet applied.  Prepped and draped in usual sterile fashion.  Exsanguinated with elevation of Esmarch. Tourniquet inflated to 250 mmHg.  Longitudinal incision above the epicondyle distally.  Skin and subcutaneous tissue divided.  Superficial extensors intact and divided longitudinally.  Marked tearing mucinous degeneration, proximal 1 cm ECRB tendon and capsule.  All of the abnormal inflamed degenerative tissue excised down the level of the radial head.  The joint explored without degenerative changes.  No evidence of instability.  Epicondyle debrided, then drilled and then irrigated.  I then repaired the superficial extensors over the defect with running Vicryl.  Subcutaneous and subcuticular closure. Margins were injected with Marcaine.   Sterile compressive dressing applied.  Well-padded sugar-tong splint.  Tourniquet removed. Anesthesia reversed.  Brought to the recovery room.  Tolerated the surgery well.  No complications.     [name], M.D.     DFM/MEDQ  D:  05/01/2015  T:  05/01/2015  Job:  [id number]

## 2015-05-14 ENCOUNTER — Encounter: Payer: Self-pay | Admitting: Physical Therapy

## 2015-05-14 ENCOUNTER — Ambulatory Visit: Payer: 59 | Attending: Orthopedic Surgery | Admitting: Physical Therapy

## 2015-05-14 DIAGNOSIS — M25511 Pain in right shoulder: Secondary | ICD-10-CM | POA: Diagnosis not present

## 2015-05-14 DIAGNOSIS — M25621 Stiffness of right elbow, not elsewhere classified: Secondary | ICD-10-CM | POA: Insufficient documentation

## 2015-05-14 DIAGNOSIS — M25521 Pain in right elbow: Secondary | ICD-10-CM | POA: Diagnosis present

## 2015-05-14 DIAGNOSIS — M6281 Muscle weakness (generalized): Secondary | ICD-10-CM | POA: Insufficient documentation

## 2015-05-14 NOTE — Therapy (Cosign Needed)
[facility] [address] [place], [place], [address] Phone: [phone]   Fax:  [phone]  Physical Therapy Evaluation  Patient Details  Name: Kelly Massey MRN: [id number] Date of Birth: 13-Sep-1965 Referring Provider: Dr. Percell Miller  Encounter Date: 05/14/2015      PT End of Session - 05/14/15 1523    Visit Number 1   Number of Visits 8   PT Start Time [id number]   PT Stop Time 1445   PT Time Calculation (min) 63 min   Activity Tolerance Patient tolerated treatment well   Behavior During Therapy [facility] for tasks assessed/performed      Past Medical History  Diagnosis Date  . Hyperlipidemia     ? no meds - diet controlled  . Colon polyps   . HSV infection     History  . GERD (gastroesophageal reflux disease) 10/12/10    EGD, positive H. pylori  . Anemia   . Post-operative nausea and vomiting   . Seasonal allergies   . Arthritis     knees - no meds    Past Surgical History  Procedure Laterality Date  . Shoulder arthroscopy Left   . Dilation and curettage of uterus      SAB  . Abdominal hysterectomy    . Ovarian cyst removal  2004    laparotomy -left  . Esophagogastroduodenoscopy N/A 07/24/2012    Procedure: ESOPHAGOGASTRODUODENOSCOPY (EGD);  Surgeon: Inda Castle, MD;  Location[facility];  Service: Endoscopy;  Laterality: N/A;  . Bravo ph study N/A 07/24/2012    Procedure: BRAVO [place] STUDY;  Surgeon: Inda Castle, MD;  [facility]: [facility];  Service: Endoscopy;  Laterality: N/A;  . Balloon dilation N/A 07/24/2012    Procedure: BALLOON DILATION;  Surgeon: Inda Castle, MD;  Location: [facility];  Service: Endoscopy;  Laterality: N/A;  . Wisdom tooth extraction    . Robotic assisted salpingo oopherectomy Left 03/21/2014    Procedure:  ROBOTIC ASSISTED LEFT OOPHORECTOMY;  Surgeon: Marvene Staff, MD;  Location: [facility];  Service: Gynecology;  Laterality: Left;  . Robotic assisted laparoscopic lysis of adhesion  N/A 03/21/2014    Procedure: ROBOTIC ASSISTED LAPAROSCOPIC EXTENSIVE LYSIS OF ADHESION (1 Hour);  Surgeon: Marvene Staff, MD;  Location: [facility];  Service: Gynecology;  Laterality: N/A;  . Tennis elbow release/nirschel procedure Right 05/01/2015    Procedure: RIGHT ELBOW DEBRIDEMENT AND TENDON REPAIR;  Surgeon: [name], MD;  Location: [place];  Service: Orthopedics;  Laterality: Right;    There were no vitals filed for this visit.  Visit Diagnosis:  Right shoulder pain  Right elbow pain  Joint stiffness of elbow, right  Muscle right arm weakness      Subjective Assessment - 05/14/15 1349    Subjective Pt. reports 3/10 R elbow pain currently at rest and c/o increase R shoulder pain at night/ sleeping.  Pt. is aware of MD restrictions for R elbow/ UE and currently wearing R wrist splint.  Pt. not able to drive at this time.     Pertinent History Pt. out of work for 3 months per MD order   Limitations Lifting;Writing;House hold activities;Other (comment)   Patient Stated Goals Increase R elbow/ UE ROM and strength to promote return to pain-free work/ household tasks.     Currently in Pain? Yes   Pain Score 3    Pain Location Elbow   Pain Orientation Right   Pain Type Surgical pain  Pain Onset 1 to 4 weeks ago   Pain Frequency Constant   Multiple Pain Sites Yes   Pain Score 0   Pain Location Shoulder   Pain Orientation Right   Pain Radiating Towards pain is much worse at night/ has to sleep on back.        B shoulder AROM WNL (all planes).  L shoulder strength grossly 5/5 MMT.  R shoulder strength grossly 5/5 MMT except sh. Abd. 4+/5 MMT. No c/o elbow pain with R shoulder ROM.    R elbow PROM: -19 deg. Extension      110 deg. Flexion  R wrist PROM: 78 deg. Flexion     52 deg. Ext.   SEE MD PROTOCOL         PT Education - 05/14/15 1439    Education provided Yes   Education Details No R wrist extension until 5 weeks.  See HEP and MD  protocol.     Person(s) Educated Patient   Methods Explanation;Demonstration;Handout   Comprehension Verbalized understanding;Returned demonstration;Verbal cues required             PT Long Term Goals - 05/14/15 1716    PT LONG TERM GOAL #1   Title Pt will increase R elbow flex PROM to >125 deg. in order to return to improve mobility.   Baseline R elbow flex 110 on 2/8   Time 4   Period Weeks   Status New   PT LONG TERM GOAL #2   Title Pt will improve self-perceived QuickDash score to <50% in order to return to knitting.   Baseline 91% on 2/8   Time 4   Period Weeks   Status New   PT LONG TERM GOAL #3   Title Pt will increase R shoulder abd MMT to 5/5 in order to complete work tasks.   Baseline R shoulder abd: 4+/5 on 2/8   Time 4   Period Weeks   Status New   PT LONG TERM GOAL #4   Title Pt will increase R wrist extension AROM to >55 deg. in order to return to knitting.   Baseline AROM wrist not measured yet   Time 4   Period Weeks   Status New   PT LONG TERM GOAL #5   Title Pt will report <2/10 R elbow pain at worst in order to complete household activities with ease.   Baseline 3-4/10 on 2/8   Time 4   Period Weeks   Status New            Plan - 05/14/15 1524    Clinical Impression Statement Pt. is a pleasant 50 y/o female who presents to PT post R elbow epicondylectomy on 05/01/15. Pt states she used ice on R elbow after surgery but has stopped since. Pt reports a 3-4/10 tingling, sore, and tender pain at lateral elbow region currently. 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 Pt states she started feeling lateral elbow pain within two weeks . Pt states that MD told her she tore a tendon and required surgery, pt will not be working for three months. Pt states she has no trouble making the bed and is able to complete some tasks with her L hand (pt  is R hand dominant), but she is unable to wash her back and lift items up such as her purse. Pt's daughter and husband have been  helping out and completing most household tasks. Pt states that after the surgery she was not able to sleep through the night due to severe R shoulder aching/throbbing pain. Pt also mentions last night was the first night she was able to sleep well without being woken by pain in the middle of the night. Pt is currently only able to sleep on her back. Pt states her hobbies include knitting hats and walknig outside. Pt's has no complaints of increased pain with PROM, only a pulling sensation with passive wrist extension. Pt also experiences pulling sensation with R shoulder flexion. Shoulder MMT: L flex: 5/5. R flex: 5/5. L abd: 5/5. No reports of elbow pain with MMT. R abd: 4+/5.  Shoulder ROM WNL. Elbow PROM: flex: 110 deg. ext: -19 deg. R wrist PROM: flex: 78 deg. ext: 52 deg. Pt will benefit from skilled PT to increase ROM  and strength in order to return to work/hobbies and prior level of function.    Pt will benefit from skilled therapeutic intervention in order to improve on the following deficits Decreased mobility;Decreased strength;Decreased scar mobility;Decreased range of motion;Hypomobility;Impaired flexibility;Impaired UE functional use;Pain;Increased fascial restricitons   Rehab Potential Good   PT Frequency 2x / week   PT Duration 4 weeks   PT Treatment/Interventions ADLs/Self Care Home Management;Cryotherapy;Electrical Stimulation;Iontophoresis 4mg /ml Dexamethasone;Moist Heat;Functional mobility training;Therapeutic activities;Therapeutic exercise;Patient/family education;Manual techniques;Passive range of motion;Ultrasound   PT Next Visit Plan PROM of elbow/Isometrics/scar massage   PT Home Exercise Plan see above   Consulted and Agree with Plan of Care Patient         Problem List Patient Active Problem List   Diagnosis Date Noted  . Health care  maintenance 11/21/2014  . History of colonic polyps 11/21/2014  . Dysphagia 11/19/2014  . Trochanteric bursitis of left hip 10/08/2014  . URI (upper respiratory infection) 02/10/2014  . Pelvic pain in female 12/25/2013  . Personal history of ovarian cyst 12/25/2013  . Change in bowel movement 11/11/2013  . breast tenderness 09/29/2013  . Dysphagia, unspecified(787.20) 06/12/2012  . Barrett's esophagus 06/12/2012  . Environmental allergies 02/13/2012  . Enlarged thyroid 02/13/2012  . GERD (gastroesophageal reflux disease) 02/08/2012  . Leukopenia 02/08/2012  . Fatigue 02/08/2012  . Hypercholesterolemia 02/08/2012   Pura Spice, PT, DPT # [id number]   05/15/2015, 1:30 PM  [facility] [address] [place], [place], [address] Phone: [phone]   Fax:  [phone]  Name: Kelly Massey MRN: [id number] Date of Birth: 1965/04/17

## 2015-05-20 ENCOUNTER — Ambulatory Visit: Payer: 59 | Admitting: Physical Therapy

## 2015-05-20 ENCOUNTER — Encounter: Payer: Self-pay | Admitting: Physical Therapy

## 2015-05-20 DIAGNOSIS — M6281 Muscle weakness (generalized): Secondary | ICD-10-CM

## 2015-05-20 DIAGNOSIS — M25511 Pain in right shoulder: Secondary | ICD-10-CM

## 2015-05-20 DIAGNOSIS — M25521 Pain in right elbow: Secondary | ICD-10-CM

## 2015-05-20 DIAGNOSIS — M25621 Stiffness of right elbow, not elsewhere classified: Secondary | ICD-10-CM

## 2015-05-20 NOTE — Therapy (Signed)
[facility] [address] [place], [place], [address] Phone: [phone]   Fax:  [phone]  Physical Therapy Treatment  Patient Details  Name: Kelly Massey MRN: [id number] Date of Birth: [birth year] Referring Provider: Dr. Percell Miller  Encounter Date: 05/20/2015      PT End of Session - 05/20/15 1233    Visit Number 2   Number of Visits 8   PT Start Time 0814   PT Stop Time 0918   PT Time Calculation (min) 64 min   Activity Tolerance Patient tolerated treatment well   Behavior During Therapy [facility] for tasks assessed/performed      Past Medical History  Diagnosis Date  . Hyperlipidemia     ? no meds - diet controlled  . Colon polyps   . HSV infection     History  . GERD (gastroesophageal reflux disease) 10/12/10    EGD, positive H. pylori  . Anemia   . Post-operative nausea and vomiting   . Seasonal allergies   . Arthritis     knees - no meds    Past Surgical History  Procedure Laterality Date  . Shoulder arthroscopy Left   . Dilation and curettage of uterus      SAB  . Abdominal hysterectomy    . Ovarian cyst removal  2004    laparotomy -left  . Esophagogastroduodenoscopy N/A 07/24/2012    Procedure: ESOPHAGOGASTRODUODENOSCOPY (EGD);  Surgeon: Inda Castle, MD;  Location: [facility];  Service: Endoscopy;  Laterality: N/A;  . Bravo ph study N/A 07/24/2012    Procedure: BRAVO [place] STUDY;  Surgeon: Inda Castle, MD;  [facility];  Service: Endoscopy;  Laterality: N/A;  . Balloon dilation N/A 07/24/2012    Procedure: BALLOON DILATION;  Surgeon: Inda Castle, MD;  Location: [facility];  Service: Endoscopy;  Laterality: N/A;  . Wisdom tooth extraction    . Robotic assisted salpingo oopherectomy Left 03/21/2014    Procedure:  ROBOTIC ASSISTED LEFT OOPHORECTOMY;  Surgeon: Marvene Staff, MD;  Location: [facility];  Service: Gynecology;  Laterality: Left;  . Robotic assisted laparoscopic lysis of adhesion  N/A 03/21/2014    Procedure: ROBOTIC ASSISTED LAPAROSCOPIC EXTENSIVE LYSIS OF ADHESION (1 Hour);  Surgeon: Marvene Staff, MD;  Location: [facility];  Service: Gynecology;  Laterality: N/A;  . Tennis elbow release/nirschel procedure Right 05/01/2015    Procedure: RIGHT ELBOW DEBRIDEMENT AND TENDON REPAIR;  Surgeon: [name], MD;  Location: [facility];  Service: Orthopedics;  Laterality: Right;    There were no vitals filed for this visit.  Visit Diagnosis:  Right elbow pain  Right shoulder pain  Joint stiffness of elbow, right  Muscle right arm weakness      Subjective Assessment - 05/20/15 1230    Subjective Pt reports 3/10 pain currently but states it was a sharp, 8/10 pain when she woke up this morning. Pt reports she was not able to sleep well Sunday night (two nights ago) due to pain in both shoulders and R forearm, but she slept through the night last night.    Pertinent History Pt. out of work for 3 months per MD order   Limitations Lifting;Writing;House hold activities;Other (comment)   Patient Stated Goals Increase R elbow/ UE ROM and strength to promote return to pain-free work/ household tasks.     Currently in Pain? Yes   Pain Score 3    Pain Location Elbow   Pain Orientation Right  Pain Type Surgical pain   Pain Onset 1 to 4 weeks ago     Objective: Manual: PROM Wrist: flexion, extension, supination, pronation along with PROM Elbow flexion and extension for 20 mins. Scar massage and STM to lateral elbow. Ther ex: Wrist flexion/extension isometrics with wrist in neutral 3 x 10 with 5 second holds. Ice over R elbow in seated position for 12 mins. SEE MD PROTOCAL  Pt response to treatment for medical necessity: Pt will benefit from skilled PT to increase elbow/wirst PROM/AROM in order to return to hobbies such as knitting. Pt will benefit manual techniques in order to break up scar tissue, decrease pain, and increase mobility.       PT Education -  05/20/15 1232    Education provided Yes   Education Details Pt was educated on importance of scar massage and icing throughout day to decrease inflammation.   Person(s) Educated Patient   Methods Explanation;Demonstration;Handout   Comprehension Verbalized understanding             PT Long Term Goals - 05/14/15 1716    PT LONG TERM GOAL #1   Title Pt will increase R elbow flex PROM to >125 deg. in order to return to improve mobility.   Baseline R elbow flex 110 on 2/8   Time 4   Period Weeks   Status New   PT LONG TERM GOAL #2   Title Pt will improve self-perceived QuickDash score to <50% in order to return to knitting.   Baseline 91% on 2/8   Time 4   Period Weeks   Status New   PT LONG TERM GOAL #3   Title Pt will increase R shoulder abd MMT to 5/5 in order to complete work tasks.   Baseline R shoulder abd: 4+/5 on 2/8   Time 4   Period Weeks   Status New   PT LONG TERM GOAL #4   Title Pt will increase R wrist extension AROM to >55 deg. in order to return to knitting.   Baseline AROM wrist not measured yet   Time 4   Period Weeks   Status New   PT LONG TERM GOAL #5   Title Pt will report <2/10 R elbow pain at worst in order to complete household activities with ease.   Baseline 3-4/10 on 2/8   Time 4   Period Weeks   Status New               Plan - 05/20/15 1234    Clinical Impression Statement Pt reports slight pulling sensation with PROM wrist flexion but states PROM wrist extension, supination, and pronation are all painfree. Pt also reports slight elbow pain with isometric wrist extension. Pt demonstrates good elbow PROM (flexion and extension) with minimal to no pain. Tightness/tenderness noted at R lateral wrist extensors with soft tissue massage. Pt scar demonstrates good mobility, pt was instructed on scar massage at home 1-2x/day for 5-7 mins along with icing to decrease inflammation.   Pt will benefit from skilled therapeutic intervention in order  to improve on the following deficits Decreased mobility;Decreased strength;Decreased scar mobility;Decreased range of motion;Hypomobility;Impaired flexibility;Impaired UE functional use;Pain;Increased fascial restricitons   Rehab Potential Good   PT Frequency 2x / week   PT Duration 4 weeks   PT Treatment/Interventions ADLs/Self Care Home Management;Cryotherapy;Electrical Stimulation;Iontophoresis 4mg /ml Dexamethasone;Moist Heat;Functional mobility training;Therapeutic activities;Therapeutic exercise;Patient/family education;Manual techniques;Passive range of motion;Ultrasound   PT Next Visit Plan PROM of elbow/Isometrics/scar massage   PT Home Exercise  Plan see above   Consulted and Agree with Plan of Care Patient        Problem List Patient Active Problem List   Diagnosis Date Noted  . Health care maintenance 11/21/2014  . History of colonic polyps 11/21/2014  . Dysphagia 11/19/2014  . Trochanteric bursitis of left hip 10/08/2014  . URI (upper respiratory infection) 02/10/2014  . Pelvic pain in female 12/25/2013  . Personal history of ovarian cyst 12/25/2013  . Change in bowel movement 11/11/2013  . breast tenderness 09/29/2013  . Dysphagia, unspecified(787.20) 06/12/2012  . Barrett's esophagus 06/12/2012  . Environmental allergies 02/13/2012  . Enlarged thyroid 02/13/2012  . GERD (gastroesophageal reflux disease) 02/08/2012  . Leukopenia 02/08/2012  . Fatigue 02/08/2012  . Hypercholesterolemia 02/08/2012   Pura Spice, PT, DPT # [id number]   05/21/2015, 2:44 PM  [facility] [address] [place], [place], [address] Phone: [phone]   Fax:  [phone]  Name: MEKAYLA OVERMILLER MRN: [id number] Date of Birth: 1965-11-12

## 2015-05-22 ENCOUNTER — Encounter: Payer: Self-pay | Admitting: Physical Therapy

## 2015-05-22 ENCOUNTER — Ambulatory Visit: Payer: 59 | Admitting: Physical Therapy

## 2015-05-22 DIAGNOSIS — M25621 Stiffness of right elbow, not elsewhere classified: Secondary | ICD-10-CM

## 2015-05-22 DIAGNOSIS — M25511 Pain in right shoulder: Secondary | ICD-10-CM | POA: Diagnosis not present

## 2015-05-22 DIAGNOSIS — M6281 Muscle weakness (generalized): Secondary | ICD-10-CM

## 2015-05-22 DIAGNOSIS — M25521 Pain in right elbow: Secondary | ICD-10-CM

## 2015-05-22 NOTE — Therapy (Signed)
[facility] [address] [place], [place], [address] Phone: [phone]   Fax:  [phone]  Physical Therapy Treatment  Patient Details  Name: Kelly Massey MRN: [id number] Date of Birth: [birth year] Referring Provider: Dr. Percell Miller  Encounter Date: 05/22/2015      PT End of Session - 05/22/15 1251    Visit Number 3   Number of Visits 8   PT Start Time 0818   PT Stop Time 0902   PT Time Calculation (min) 44 min   Activity Tolerance Patient tolerated treatment well   Behavior During Therapy [facility] for tasks assessed/performed      Past Medical History  Diagnosis Date  . Hyperlipidemia     ? no meds - diet controlled  . Colon polyps   . HSV infection     History  . GERD (gastroesophageal reflux disease) 10/12/10    EGD, positive H. pylori  . Anemia   . Post-operative nausea and vomiting   . Seasonal allergies   . Arthritis     knees - no meds    Past Surgical History  Procedure Laterality Date  . Shoulder arthroscopy Left   . Dilation and curettage of uterus      SAB  . Abdominal hysterectomy    . Ovarian cyst removal  2004    laparotomy -left  . Esophagogastroduodenoscopy N/A 07/24/2012    Procedure: ESOPHAGOGASTRODUODENOSCOPY (EGD);  Surgeon: Inda Castle, MD;  Location: [facility];  Service: Endoscopy;  Laterality: N/A;  . Bravo ph study N/A 07/24/2012    Procedure: BRAVO [place] STUDY;  Surgeon: Inda Castle, MD;  [facility];  Service: Endoscopy;  Laterality: N/A;  . Balloon dilation N/A 07/24/2012    Procedure: BALLOON DILATION;  Surgeon: Inda Castle, MD;  [facility]: [facility];  Service: Endoscopy;  Laterality: N/A;  . Wisdom tooth extraction    . Robotic assisted salpingo oopherectomy Left 03/21/2014    Procedure:  ROBOTIC ASSISTED LEFT OOPHORECTOMY;  Surgeon: Marvene Staff, MD;  Location: [facility];  Service: Gynecology;  Laterality: Left;  . Robotic assisted laparoscopic lysis of adhesion  N/A 03/21/2014    Procedure: ROBOTIC ASSISTED LAPAROSCOPIC EXTENSIVE LYSIS OF ADHESION (1 Hour);  Surgeon: Marvene Staff, MD;  Location: [facility];  Service: Gynecology;  Laterality: N/A;  . Tennis elbow release/nirschel procedure Right 05/01/2015    Procedure: RIGHT ELBOW DEBRIDEMENT AND TENDON REPAIR;  Surgeon: [name], MD;  Location: [facility];  Service: Orthopedics;  Laterality: Right;    There were no vitals filed for this visit.  Visit Diagnosis:  No diagnosis found.      Subjective Assessment - 05/22/15 1247    Subjective Pt reports 0/10 elbow pain with no new complaints. Pt reports she has still been having trouble sleeping due to shoulder pain but mentions she does not have that shoulder pain today. Pt states she has been completing HEP with no issues.    Pertinent History Pt. out of work for 3 months per MD order   Limitations Lifting;Writing;House hold activities;Other (comment)   Patient Stated Goals Increase R elbow/ UE ROM and strength to promote return to pain-free work/ household tasks.     Currently in Pain? No/denies   Pain Onset 1 to 4 weeks ago     Objective: Manual: PROM Wrist: flexion, extension, supination, pronation along with PROM Elbow flexion and extension for 20 mins. Scar massage and STM to lateral elbow. Ther ex:  Wrist flexion/extension isometrics with wrist in neutral 3 x 10 with 5 second holds. Standing shoulder flexion and abduction 2 x 10. SEE MD PROTOCAL  Pt response to treatment for medical necessity: Pt will benefit from skilled PT to increase elbow/wirst PROM/AROM in order to return to hobbies such as knitting. Pt will benefit manual techniques in order to break up scar tissue, decrease pain, and increase mobility.        PT Long Term Goals - 05/14/15 1716    PT LONG TERM GOAL #1   Title Pt will increase R elbow flex PROM to >125 deg. in order to return to improve mobility.   Baseline R elbow flex 110 on 2/8   Time 4    Period Weeks   Status New   PT LONG TERM GOAL #2   Title Pt will improve self-perceived QuickDash score to <50% in order to return to knitting.   Baseline 91% on 2/8   Time 4   Period Weeks   Status New   PT LONG TERM GOAL #3   Title Pt will increase R shoulder abd MMT to 5/5 in order to complete work tasks.   Baseline R shoulder abd: 4+/5 on 2/8   Time 4   Period Weeks   Status New   PT LONG TERM GOAL #4   Title Pt will increase R wrist extension AROM to >55 deg. in order to return to knitting.   Baseline AROM wrist not measured yet   Time 4   Period Weeks   Status New   PT LONG TERM GOAL #5   Title Pt will report <2/10 R elbow pain at worst in order to complete household activities with ease.   Baseline 3-4/10 on 2/8   Time 4   Period Weeks   Status New            Plan - 05/22/15 1256    Clinical Impression Statement Pt continues to report slight pulling sensaiton with wrist flexion and supination but no problems with remaining wrist/elbows passive motions. Tightness noted under R lateral elbow with no reports of increased pain with STM and scar massage. Pt  slight pulling sensation on dorsal aspect of hand with isometric wrist extension, but no increased pain. Pt shows minimal-no upper trap compensation with shoulder flexion and abduction in standing.   Pt will benefit from skilled therapeutic intervention in order to improve on the following deficits Decreased mobility;Decreased strength;Decreased scar mobility;Decreased range of motion;Hypomobility;Impaired flexibility;Impaired UE functional use;Pain;Increased fascial restricitons   Rehab Potential Good   PT Frequency 2x / week   PT Duration 4 weeks   PT Treatment/Interventions ADLs/Self Care Home Management;Cryotherapy;Electrical Stimulation;Iontophoresis 4mg /ml Dexamethasone;Moist Heat;Functional mobility training;Therapeutic activities;Therapeutic exercise;Patient/family education;Manual techniques;Passive range of  motion;Ultrasound   PT Next Visit Plan PROM of elbow/Isometrics/scar massage   PT Home Exercise Plan see above   Consulted and Agree with Plan of Care Patient        Problem List Patient Active Problem List   Diagnosis Date Noted  . Health care maintenance 11/21/2014  . History of colonic polyps 11/21/2014  . Dysphagia 11/19/2014  . Trochanteric bursitis of left hip 10/08/2014  . URI (upper respiratory infection) 02/10/2014  . Pelvic pain in female 12/25/2013  . Personal history of ovarian cyst 12/25/2013  . Change in bowel movement 11/11/2013  . breast tenderness 09/29/2013  . Dysphagia, unspecified(787.20) 06/12/2012  . Barrett's esophagus 06/12/2012  . Environmental allergies 02/13/2012  . Enlarged thyroid 02/13/2012  . GERD (  gastroesophageal reflux disease) 02/08/2012  . Leukopenia 02/08/2012  . Fatigue 02/08/2012  . Hypercholesterolemia 02/08/2012    Georgina Pillion, SPT 05/22/2015, 1:01 PM  [facility] [address] [place], [place], [address] Phone: [phone]   Fax:  [phone]  Name: Kelly Massey MRN: [id number] Date of Birth: 12-18-1965

## 2015-05-27 ENCOUNTER — Encounter: Payer: Self-pay | Admitting: Physical Therapy

## 2015-05-27 ENCOUNTER — Ambulatory Visit: Payer: 59 | Admitting: Physical Therapy

## 2015-05-27 DIAGNOSIS — M25511 Pain in right shoulder: Secondary | ICD-10-CM

## 2015-05-27 DIAGNOSIS — M25621 Stiffness of right elbow, not elsewhere classified: Secondary | ICD-10-CM

## 2015-05-27 DIAGNOSIS — M6281 Muscle weakness (generalized): Secondary | ICD-10-CM

## 2015-05-27 DIAGNOSIS — M25521 Pain in right elbow: Secondary | ICD-10-CM

## 2015-05-27 NOTE — Therapy (Signed)
[facility] [address] [place], [place], [address] Phone: [phone]   Fax:  [phone]  Physical Therapy Treatment  Patient Details  Name: Kelly Massey MRN: [id number] Date of Birth: 01/08/1966 Referring Provider: Dr. Percell Miller  Encounter Date: 05/27/2015      PT End of Session - 05/27/15 1704    Visit Number 4   Number of Visits 8   Date for PT Re-Evaluation 06/11/15   PT Start Time 0814   PT Stop Time 0902   PT Time Calculation (min) 48 min   Activity Tolerance Patient tolerated treatment well   Behavior During Therapy [facility] for tasks assessed/performed      Past Medical History  Diagnosis Date  . Hyperlipidemia     ? no meds - diet controlled  . Colon polyps   . HSV infection     History  . GERD (gastroesophageal reflux disease) 10/12/10    EGD, positive H. pylori  . Anemia   . Post-operative nausea and vomiting   . Seasonal allergies   . Arthritis     knees - no meds    Past Surgical History  Procedure Laterality Date  . Shoulder arthroscopy Left   . Dilation and curettage of uterus      SAB  . Abdominal hysterectomy    . Ovarian cyst removal  2004    laparotomy -left  . Esophagogastroduodenoscopy N/A 07/24/2012    Procedure: ESOPHAGOGASTRODUODENOSCOPY (EGD);  Surgeon: Inda Castle, MD;  Location: [facility];  Service: Endoscopy;  Laterality: N/A;  . Bravo ph study N/A 07/24/2012    Procedure: BRAVO [place] STUDY;  Surgeon: Inda Castle, MD;  Location: [facility];  Service: Endoscopy;  Laterality: N/A;  . Balloon dilation N/A 07/24/2012    Procedure: BALLOON DILATION;  Surgeon: Inda Castle, MD;  Location: [facility];  Service: Endoscopy;  Laterality: N/A;  . Wisdom tooth extraction    . Robotic assisted salpingo oopherectomy Left 03/21/2014    Procedure:  ROBOTIC ASSISTED LEFT OOPHORECTOMY;  Surgeon: Marvene Staff, MD;  Location: [facility];  Service: Gynecology;  Laterality: Left;  . Robotic  assisted laparoscopic lysis of adhesion N/A 03/21/2014    Procedure: ROBOTIC ASSISTED LAPAROSCOPIC EXTENSIVE LYSIS OF ADHESION (1 Hour);  Surgeon: Marvene Staff, MD;  Location: [facility];  Service: Gynecology;  Laterality: N/A;  . Tennis elbow release/nirschel procedure Right 05/01/2015    Procedure: RIGHT ELBOW DEBRIDEMENT AND TENDON REPAIR;  Surgeon: [name], MD;  Location: [facility];  Service: Orthopedics;  Laterality: Right;    There were no vitals filed for this visit.  Visit Diagnosis:  Right elbow pain  Right shoulder pain  Joint stiffness of elbow, right  Muscle right arm weakness      Subjective Assessment - 05/27/15 1256    Subjective Pt reports 0/10 elbow pain but continues to report trouble with sleeping due to R shoulder pain com ing on at night.    Pertinent History Pt. out of work for 3 months per MD order   Limitations Lifting;Writing;House hold activities;Other (comment)   Patient Stated Goals Increase R elbow/ UE ROM and strength to promote return to pain-free work/ household tasks.     Currently in Pain? No/denies   Pain Onset 1 to 4 weeks ago       Objective: Manual: PROM Wrist: flexion, extension, supination, pronation along with PROM Elbow flexion and extension. Scar massage and STM to lateral elbow. AP/Inf  grade III mobilizations to R shoulder x 1 bout. STM to R upper trap. Ther ex: Wrist flexion/extension/supination/pronation isometrics with wrist in neutral 3 x 10 with 5 second holds. Elbow flexion and extension with manual resistance from PT x 10. SEE MD PROTOCAL  Pt response to treatment for medical necessity: Pt will benefit from skilled PT to increase elbow/wirst PROM/AROM in order to return to hobbies such as knitting. Pt will benefit manual techniques in order to break up scar tissue, decrease pain, and increase mobility.        PT Education - 05/27/15 1704    Education provided Yes   Education Details See above handout    Person(s) Educated Patient   Methods Explanation;Demonstration;Handout   Comprehension Verbalized understanding;Returned demonstration             PT Long Term Goals - 05/14/15 1716    PT LONG TERM GOAL #1   Title Pt will increase R elbow flex PROM to >125 deg. in order to return to improve mobility.   Baseline R elbow flex 110 on 2/8   Time 4   Period Weeks   Status New   PT LONG TERM GOAL #2   Title Pt will improve self-perceived QuickDash score to <50% in order to return to knitting.   Baseline 91% on 2/8   Time 4   Period Weeks   Status New   PT LONG TERM GOAL #3   Title Pt will increase R shoulder abd MMT to 5/5 in order to complete work tasks.   Baseline R shoulder abd: 4+/5 on 2/8   Time 4   Period Weeks   Status New   PT LONG TERM GOAL #4   Title Pt will increase R wrist extension AROM to >55 deg. in order to return to knitting.   Baseline AROM wrist not measured yet   Time 4   Period Weeks   Status New   PT LONG TERM GOAL #5   Title Pt will report <2/10 R elbow pain at worst in order to complete household activities with ease.   Baseline 3-4/10 on 2/8   Time 4   Period Weeks   Status New             Plan - 05/27/15 1706    Clinical Impression Statement Pt demonstrates increased wrist strength with isometrics in all directions. Pt reports slight pulling sensation with wrist flexion but no reports of increased pain. Pt demonstrates good motion with elbow flexion and extension in supine position with no pulling sensation. Hypomobility noted with AP/Inf R shoulder mobilizations and tightness noted at R UT.   Pt will benefit from skilled therapeutic intervention in order to improve on the following deficits Decreased mobility;Decreased strength;Decreased scar mobility;Decreased range of motion;Hypomobility;Impaired flexibility;Impaired UE functional use;Pain;Increased fascial restricitons   Rehab Potential Good   PT Frequency 2x / week   PT Duration 4  weeks   PT Treatment/Interventions ADLs/Self Care Home Management;Cryotherapy;Electrical Stimulation;Iontophoresis 4mg /ml Dexamethasone;Moist Heat;Functional mobility training;Therapeutic activities;Therapeutic exercise;Patient/family education;Manual techniques;Passive range of motion;Ultrasound   PT Next Visit Plan PROM of elbow/Isometrics/scar massage   PT Home Exercise Plan see above   Consulted and Agree with Plan of Care Patient        Problem List Patient Active Problem List   Diagnosis Date Noted  . Health care maintenance 11/21/2014  . History of colonic polyps 11/21/2014  . Dysphagia 11/19/2014  . Trochanteric bursitis of left hip 10/08/2014  . URI (upper respiratory infection) 02/10/2014  .  Pelvic pain in female 12/25/2013  . Personal history of ovarian cyst 12/25/2013  . Change in bowel movement 11/11/2013  . breast tenderness 09/29/2013  . Dysphagia, unspecified(787.20) 06/12/2012  . Barrett's esophagus 06/12/2012  . Environmental allergies 02/13/2012  . Enlarged thyroid 02/13/2012  . GERD (gastroesophageal reflux disease) 02/08/2012  . Leukopenia 02/08/2012  . Fatigue 02/08/2012  . Hypercholesterolemia 02/08/2012   Pura Spice, PT, DPT # [phone]   05/28/2015, 11:28 AM  [facility] [address] [place], [place], [address] Phone: [phone]   Fax:  [phone]  Name: Kelly Massey MRN: [id number] Date of Birth: February 20, 1966

## 2015-05-29 ENCOUNTER — Encounter: Payer: Self-pay | Admitting: Physical Therapy

## 2015-05-29 ENCOUNTER — Ambulatory Visit: Payer: 59 | Admitting: Physical Therapy

## 2015-05-29 DIAGNOSIS — M25621 Stiffness of right elbow, not elsewhere classified: Secondary | ICD-10-CM

## 2015-05-29 DIAGNOSIS — M25511 Pain in right shoulder: Secondary | ICD-10-CM

## 2015-05-29 DIAGNOSIS — M6281 Muscle weakness (generalized): Secondary | ICD-10-CM

## 2015-05-29 DIAGNOSIS — M25521 Pain in right elbow: Secondary | ICD-10-CM

## 2015-05-29 NOTE — Therapy (Signed)
[facility] [address] [place], [place], [address] Phone: [phone]   Fax:  [phone]  Physical Therapy Treatment  Patient Details  Name: Kelly Massey MRN: [id number] Date of Birth: 08-Nov-1965 Referring Provider: Dr. Percell Miller  Encounter Date: 05/29/2015      PT End of Session - 05/29/15 1414    Visit Number 5   Number of Visits 8   Date for PT Re-Evaluation 06/11/15   PT Start Time 0814   PT Stop Time 0859   PT Time Calculation (min) 45 min   Activity Tolerance Patient tolerated treatment well   Behavior During Therapy [facility] for tasks assessed/performed      Past Medical History  Diagnosis Date  . Hyperlipidemia     ? no meds - diet controlled  . Colon polyps   . HSV infection     History  . GERD (gastroesophageal reflux disease) 10/12/10    EGD, positive H. pylori  . Anemia   . Post-operative nausea and vomiting   . Seasonal allergies   . Arthritis     knees - no meds    Past Surgical History  Procedure Laterality Date  . Shoulder arthroscopy Left   . Dilation and curettage of uterus      SAB  . Abdominal hysterectomy    . Ovarian cyst removal  2004    laparotomy -left  . Esophagogastroduodenoscopy N/A 07/24/2012    Procedure: ESOPHAGOGASTRODUODENOSCOPY (EGD);  Surgeon: Inda Castle, MD;  Location: [facility];  Service: Endoscopy;  Laterality: N/A;  . Bravo ph study N/A 07/24/2012    Procedure: BRAVO [place] STUDY;  Surgeon: Inda Castle, MD;  Location: [facility];  Service: Endoscopy;  Laterality: N/A;  . Balloon dilation N/A 07/24/2012    Procedure: BALLOON DILATION;  Surgeon: Inda Castle, MD;  Location: [facility];  Service: Endoscopy;  Laterality: N/A;  . Wisdom tooth extraction    . Robotic assisted salpingo oopherectomy Left 03/21/2014    Procedure:  ROBOTIC ASSISTED LEFT OOPHORECTOMY;  Surgeon: Marvene Staff, MD;  Location: [facility];  Service: Gynecology;  Laterality: Left;  . Robotic  assisted laparoscopic lysis of adhesion N/A 03/21/2014    Procedure: ROBOTIC ASSISTED LAPAROSCOPIC EXTENSIVE LYSIS OF ADHESION (1 Hour);  Surgeon: Marvene Staff, MD;  Location: [facility];  Service: Gynecology;  Laterality: N/A;  . Tennis elbow release/nirschel procedure Right 05/01/2015    Procedure: RIGHT ELBOW DEBRIDEMENT AND TENDON REPAIR;  Surgeon: [name], MD;  Location: [facility];  Service: Orthopedics;  Laterality: Right;    There were no vitals filed for this visit.  Visit Diagnosis:  Right elbow pain  Right shoulder pain  Muscle right arm weakness  Joint stiffness of elbow, right      Subjective Assessment - 05/29/15 1413    Subjective Pt reports 1/10 elbow pain today. Pt reports she has been able to sleep better the past couple nights and has not had that same shoulder pain.    Pertinent History Pt. out of work for 3 months per MD order   Limitations Lifting;Writing;House hold activities;Other (comment)   Patient Stated Goals Increase R elbow/ UE ROM and strength to promote return to pain-free work/ household tasks.     Currently in Pain? Yes   Pain Score 1    Pain Location Elbow   Pain Orientation Right   Pain Type Surgical pain   Pain Onset 1 to 4 weeks ago  Objective: UBE 5 mins forward (warm-up/no charge) Manual: PROM Wrist: flexion, extension, supination, pronation along with PROM Elbow flexion and extension.Ther ex: Wrist extension isometrics with wrist in neutral 2 x 10 with 5 second holds. Wrist flexion/supination/pronation with 2# weight- 2 x 10 in seated with verbal and tactile cueing to prevent wrist extension. Elbow flexion with 2# weight- 2 x 10 in seated. Shoulder flexion and abduction in standing with 2# weight-2 x 10 with verbal cueing to keep elbows straight.   Pt response to treatment for medical necessity: Pt will benefit from skilled PT to increase elbow/wirst PROM/AROM in order to return to hobbies such as knitting. Pt  will benefit manual techniques in order to break up scar tissue, decrease pain, and increase mobility.         PT Long Term Goals - 05/14/15 1716    PT LONG TERM GOAL #1   Title Pt will increase R elbow flex PROM to >125 deg. in order to return to improve mobility.   Baseline R elbow flex 110 on 2/8   Time 4   Period Weeks   Status New   PT LONG TERM GOAL #2   Title Pt will improve self-perceived QuickDash score to <50% in order to return to knitting.   Baseline 91% on 2/8   Time 4   Period Weeks   Status New   PT LONG TERM GOAL #3   Title Pt will increase R shoulder abd MMT to 5/5 in order to complete work tasks.   Baseline R shoulder abd: 4+/5 on 2/8   Time 4   Period Weeks   Status New   PT LONG TERM GOAL #4   Title Pt will increase R wrist extension AROM to >55 deg. in order to return to knitting.   Baseline AROM wrist not measured yet   Time 4   Period Weeks   Status New   PT LONG TERM GOAL #5   Title Pt will report <2/10 R elbow pain at worst in order to complete household activities with ease.   Baseline 3-4/10 on 2/8   Time 4   Period Weeks   Status New               Plan - 05/29/15 1421    Clinical Impression Statement Pt continues to report slight pulling with PROM wrist flexion but no increased pain. Pt required verbal and tactile cueing throughout treatment session to prevent wrist extension with most exercises. Pt also requires verbal cueing with shoulder flexion and abduction to keep elbows straight. Pt is progressing towards goals of increasing ROM/strength as well as improving functional mobility with daily tasks at home.   Pt will benefit from skilled therapeutic intervention in order to improve on the following deficits Decreased mobility;Decreased strength;Decreased scar mobility;Decreased range of motion;Hypomobility;Impaired flexibility;Impaired UE functional use;Pain;Increased fascial restricitons   Rehab Potential Good   PT Frequency 2x /  week   PT Duration 4 weeks   PT Treatment/Interventions ADLs/Self Care Home Management;Cryotherapy;Electrical Stimulation;Iontophoresis 4mg /ml Dexamethasone;Moist Heat;Functional mobility training;Therapeutic activities;Therapeutic exercise;Patient/family education;Manual techniques;Passive range of motion;Ultrasound   PT Next Visit Plan PROM of elbow/Isometrics/scar massage   PT Home Exercise Plan see above   Consulted and Agree with Plan of Care Patient        Problem List Patient Active Problem List   Diagnosis Date Noted  . Health care maintenance 11/21/2014  . History of colonic polyps 11/21/2014  . Dysphagia 11/19/2014  . Trochanteric bursitis of left hip  10/08/2014  . URI (upper respiratory infection) 02/10/2014  . Pelvic pain in female 12/25/2013  . Personal history of ovarian cyst 12/25/2013  . Change in bowel movement 11/11/2013  . breast tenderness 09/29/2013  . Dysphagia, unspecified(787.20) 06/12/2012  . Barrett's esophagus 06/12/2012  . Environmental allergies 02/13/2012  . Enlarged thyroid 02/13/2012  . GERD (gastroesophageal reflux disease) 02/08/2012  . Leukopenia 02/08/2012  . Fatigue 02/08/2012  . Hypercholesterolemia 02/08/2012   Pura Spice, PT, DPT # [phone]   05/30/2015, 10:04 AM  [facility] [address] [place], [place], [address] Phone: [phone]   Fax:  [phone]  Name: Kelly Massey MRN: [id number] Date of Birth: [birth year]

## 2015-06-03 ENCOUNTER — Ambulatory Visit: Payer: 59 | Admitting: Physical Therapy

## 2015-06-04 ENCOUNTER — Encounter: Payer: Self-pay | Admitting: Nurse Practitioner

## 2015-06-04 ENCOUNTER — Ambulatory Visit (INDEPENDENT_AMBULATORY_CARE_PROVIDER_SITE_OTHER): Payer: 59 | Admitting: Nurse Practitioner

## 2015-06-04 VITALS — BP 132/84 | HR 100 | Temp 98.6°F | Ht 66.0 in | Wt 198.8 lb

## 2015-06-04 DIAGNOSIS — J069 Acute upper respiratory infection, unspecified: Secondary | ICD-10-CM | POA: Diagnosis not present

## 2015-06-04 MED ORDER — PREDNISONE 10 MG PO TABS: ORAL_TABLET | ORAL | Status: DC

## 2015-06-04 MED ORDER — HYDROCOD POLST-CPM POLST ER 10-8 MG/5ML PO SUER: 5.0000 mL | Freq: Every evening | ORAL | Status: DC | PRN

## 2015-06-04 NOTE — Progress Notes (Signed)
Pre visit review using our clinic review tool, if applicable. No additional management support is needed unless otherwise documented below in the visit note. 

## 2015-06-04 NOTE — Patient Instructions (Signed)
Prednisone with breakfast or lunch at the latest.  6 tablets on day 1, 5 tablets on day 2, 4 tablets on day 3, 3 tablets on day 4, 2 tablets day 5, 1 tablet on day 6...done! Take tablets all together not spaced out Don't take with NSAIDs (Ibuprofen, Aleve, Naproxen, Meloxicam ect...)  5 mL (1 teaspoon) of cough syrup. Don't drive or make important decisions while taking this....just sleep and rest.   Lots of water and rest. This is viral. It will improve over the next 7-10 days.

## 2015-06-04 NOTE — Progress Notes (Addendum)
Patient ID: Kelly Massey, female    DOB: July 22, 1965  Age: 50 y.o. MRN: [id number]  CC: Acute Visit   HPI Kelly Massey presents for CC of cough x 5 days.   1) Pt reports ST and congestion Cough and chest congestion  HA Pressure in ears  Treatment to date: Mucinex DM Coricidin  Benadryl   Sick contacts:  Husband, daughter, friends  History Kelly Massey has a past medical history of Hyperlipidemia; Colon polyps; HSV infection; GERD (gastroesophageal reflux disease) (10/12/10); Anemia; Post-operative nausea and vomiting; Seasonal allergies; and Arthritis.   She has past surgical history that includes Shoulder arthroscopy (Left); Dilation and curettage of uterus; Abdominal hysterectomy; Ovarian cyst removal (2004); Esophagogastroduodenoscopy (N/A, 07/24/2012); BRAVO ph study (N/A, 07/24/2012); Balloon dilation (N/A, 07/24/2012); Wisdom tooth extraction; Robotic assisted salpingo oophorectomy (Left, 03/21/2014); Robotic assisted laparoscopic lysis of adhesion (N/A, 03/21/2014); and Tennis elbow release/nirschel procedure (Right, 05/01/2015).   Her family history includes Arthritis in her mother; Breast cancer in her maternal aunt; Dementia in her mother; Diabetes in her brother, maternal uncle, paternal grandfather, paternal grandmother, and sister; Heart failure in her mother; Hyperlipidemia in her other; Hypertension in her mother and other; Lung cancer in her father; Prostate cancer in her maternal uncle; Stroke in her mother; Throat cancer in her maternal aunt. There is no history of Colon cancer.She reports that she has never smoked. She has never used smokeless tobacco. She reports that she does not drink alcohol or use illicit drugs.  Outpatient Prescriptions Prior to Visit  Medication Sig Dispense Refill  . acyclovir (ZOVIRAX) 400 MG tablet Take 1 tablet (400 mg total) by mouth 2 (two) times daily as needed (for outbreaks). 60 tablet 2  . Calcium Carbonate-Vitamin D (CALCIUM 600+D3) 600-400  MG-UNIT per tablet Take 1 tablet by mouth daily.    . Cholecalciferol (VITAMIN D PO) Take 2,000 Int'l Units by mouth daily. Gummy formulation    . DEXILANT 60 MG capsule TAKE 1 CAPSULE (60 MG TOTAL) BY MOUTH DAILY. 30 capsule 2  . diphenhydrAMINE (BENADRYL) 12.5 MG/5ML elixir Take 12.5 mg by mouth at bedtime as needed for sleep.    . ferrous sulfate 325 (65 FE) MG tablet Take 325 mg by mouth daily.     . fluocinonide ointment (LIDEX) 0.05 % APPLY TO AFFECTED AREA OF THE SCALP 3-4 TIMES A WEEKLY  5  . mometasone (NASONEX) 50 MCG/ACT nasal spray Place 2 sprays into the nose daily. 51 g 3  . ondansetron (ZOFRAN) 4 MG tablet Take 1 tablet (4 mg total) by mouth every 8 (eight) hours as needed for nausea or vomiting. 40 tablet 0  . ranitidine (ZANTAC) 150 MG tablet Take 300 mg by mouth daily as needed for heartburn.     . vitamin E 400 UNIT capsule Take 400 Units by mouth daily.    Marland Kitchen HYDROcodone-acetaminophen (NORCO) 10-325 MG tablet Take 1 tablet by mouth every 4 (four) hours as needed. 60 tablet 0   No facility-administered medications prior to visit.    ROS Review of Systems  Constitutional: Positive for chills, diaphoresis and fatigue. Negative for fever.  HENT: Positive for congestion, ear pain, postnasal drip, rhinorrhea, sinus pressure and sore throat. Negative for ear discharge.   Respiratory: Positive for cough. Negative for chest tightness, shortness of breath and wheezing.   Cardiovascular: Negative for chest pain, palpitations and leg swelling.  Gastrointestinal: Negative for nausea, vomiting and diarrhea.  Skin: Negative for rash.  Neurological: Positive for headaches. Negative for dizziness.  Objective:  BP 132/84 mmHg  Pulse 100  Temp(Src) 98.6 F (37 C) (Oral)  Ht 5\' 6"  (1.676 m)  Wt 198 lb 12 oz (90.152 kg)  BMI 32.09 kg/m2  SpO2 98%  LMP 10/06/2011  Physical Exam  Constitutional: She is oriented to person, place, and time. She appears well-developed and  well-nourished. No distress.  HENT:  Head: Normocephalic and atraumatic.  Right Ear: External ear normal.  Left Ear: External ear normal.  Mouth/Throat: Oropharynx is clear and moist. No oropharyngeal exudate.  TM's clear bilaterally  Eyes: EOM are normal. Pupils are equal, round, and reactive to light. Right eye exhibits no discharge. Left eye exhibits no discharge. No scleral icterus.  Neck: Normal range of motion. Neck supple.  Cardiovascular: Regular rhythm and normal heart sounds.  Exam reveals no gallop and no friction rub.   No murmur heard. Pulmonary/Chest: Effort normal and breath sounds normal. No respiratory distress. She has no wheezes. She has no rales. She exhibits no tenderness.  Lymphadenopathy:    She has no cervical adenopathy.  Neurological: She is alert and oriented to person, place, and time. No cranial nerve deficit. She exhibits normal muscle tone. Coordination normal.  Skin: Skin is warm and dry. No rash noted. She is not diaphoretic.  Psychiatric: She has a normal mood and affect. Her behavior is normal. Judgment and thought content normal.      Assessment & Plan:   Kelly Massey was seen today for acute visit.  Diagnoses and all orders for this visit:  URI (upper respiratory infection)  Other orders -     chlorpheniramine-HYDROcodone (TUSSIONEX PENNKINETIC ER) 10-8 MG/5ML SUER; Take 5 mLs by mouth at bedtime as needed for cough. -     predniSONE (DELTASONE) 10 MG tablet; Take 6 tablets by mouth on day 1 with breakfast then decrease by 1 tablet each day until gone.  I have discontinued Kelly Massey's HYDROcodone-acetaminophen. I am also having her start on chlorpheniramine-HYDROcodone and predniSONE. Additionally, I am having her maintain her ranitidine, Cholecalciferol (VITAMIN D PO), ferrous sulfate, Calcium Carbonate-Vitamin D, vitamin E, diphenhydrAMINE, acyclovir, fluocinonide ointment, mometasone, DEXILANT, and ondansetron.  Meds ordered this encounter   Medications  . chlorpheniramine-HYDROcodone (TUSSIONEX PENNKINETIC ER) 10-8 MG/5ML SUER    Sig: Take 5 mLs by mouth at bedtime as needed for cough.    Dispense:  115 mL    Refill:  0    Order Specific Question:  Supervising Provider    Answer:  Derrel Nip, TERESA L [[id number]]  . predniSONE (DELTASONE) 10 MG tablet    Sig: Take 6 tablets by mouth on day 1 with breakfast then decrease by 1 tablet each day until gone.    Dispense:  21 tablet    Refill:  0    Order Specific Question:  Supervising Provider    Answer:  Crecencio Mc [[id number]]     Follow-up: Return if symptoms worsen or fail to improve.

## 2015-06-04 NOTE — Assessment & Plan Note (Signed)
New onset Will treat conservatively due to probable viral nature Prednisone taper to decrease inflammation given  Instructions for taking done verbally and on AVS Mucinex encouraged Tussionex is okay per pt and has take with relief before- Instructions given verbally and on AVS FU prn worsening/failure to improve.

## 2015-06-05 ENCOUNTER — Ambulatory Visit: Payer: 59 | Attending: Orthopedic Surgery | Admitting: Physical Therapy

## 2015-06-05 ENCOUNTER — Encounter: Payer: Self-pay | Admitting: Physical Therapy

## 2015-06-05 DIAGNOSIS — M25621 Stiffness of right elbow, not elsewhere classified: Secondary | ICD-10-CM | POA: Insufficient documentation

## 2015-06-05 DIAGNOSIS — M25511 Pain in right shoulder: Secondary | ICD-10-CM | POA: Diagnosis present

## 2015-06-05 DIAGNOSIS — M25521 Pain in right elbow: Secondary | ICD-10-CM | POA: Diagnosis not present

## 2015-06-05 DIAGNOSIS — M6281 Muscle weakness (generalized): Secondary | ICD-10-CM | POA: Diagnosis present

## 2015-06-05 NOTE — Therapy (Signed)
[facility] [address]. [place], [place], [address] Phone: [phone]   Fax:  [phone]  Physical Therapy Treatment  Patient Details  Name: Kelly Massey MRN: [id number] Date of Birth: 09-22-1965 Referring Provider: Dr. Percell Miller  Encounter Date: 06/05/2015      PT End of Session - 06/05/15 1209    Visit Number 6   Number of Visits 8   Date for PT Re-Evaluation 06/11/15   PT Start Time 0814   PT Stop Time 0901   PT Time Calculation (min) 47 min   Activity Tolerance Patient tolerated treatment well   Behavior During Therapy [facility] for tasks assessed/performed      Past Medical History  Diagnosis Date  . Hyperlipidemia     ? no meds - diet controlled  . Colon polyps   . HSV infection     History  . GERD (gastroesophageal reflux disease) 10/12/10    EGD, positive H. pylori  . Anemia   . Post-operative nausea and vomiting   . Seasonal allergies   . Arthritis     knees - no meds    Past Surgical History  Procedure Laterality Date  . Shoulder arthroscopy Left   . Dilation and curettage of uterus      SAB  . Abdominal hysterectomy    . Ovarian cyst removal  2004    laparotomy -left  . Esophagogastroduodenoscopy N/A 07/24/2012    Procedure: ESOPHAGOGASTRODUODENOSCOPY (EGD);  Surgeon: Inda Castle, MD;  Location: [facility];  Service: Endoscopy;  Laterality: N/A;  . Bravo ph study N/A 07/24/2012    Procedure: BRAVO [place] STUDY;  Surgeon: Inda Castle, MD;  Location: [facility];  Service: Endoscopy;  Laterality: N/A;  . Balloon dilation N/A 07/24/2012    Procedure: BALLOON DILATION;  Surgeon: Inda Castle, MD;  Location: [facility];  Service: Endoscopy;  Laterality: N/A;  . Wisdom tooth extraction    . Robotic assisted salpingo oopherectomy Left 03/21/2014    Procedure:  ROBOTIC ASSISTED LEFT OOPHORECTOMY;  Surgeon: Marvene Staff, MD;  Location: [facility];  Service: Gynecology;  Laterality: Left;  . Robotic  assisted laparoscopic lysis of adhesion N/A 03/21/2014    Procedure: ROBOTIC ASSISTED LAPAROSCOPIC EXTENSIVE LYSIS OF ADHESION (1 Hour);  Surgeon: Marvene Staff, MD;  Location: [facility];  Service: Gynecology;  Laterality: N/A;  . Tennis elbow release/nirschel procedure Right 05/01/2015    Procedure: RIGHT ELBOW DEBRIDEMENT AND TENDON REPAIR;  Surgeon: [name], MD;  Location: [place];  Service: Orthopedics;  Laterality: Right;    There were no vitals filed for this visit.  Visit Diagnosis:  Right elbow pain  Right shoulder pain  Muscle right arm weakness  Joint stiffness of elbow, right      Subjective Assessment - 06/05/15 1206    Subjective Pt reports 0/10 elbow pain today. Pt does not report that she is having difficulties performing any ADLs/IADLs, but has been avoiding doing much lifting or repetitive movements.    Pertinent History Pt. out of work for 3 months per MD order   Limitations Lifting;Writing;House hold activities;Other (comment)   Patient Stated Goals Increase R elbow/ UE ROM and strength to promote return to pain-free work/ household tasks.     Currently in Pain? No/denies   Multiple Pain Sites No       Objective: UBE 4 mins forward (cool-down/no charge) Manual: PROM Wrist: flexion, extension, supination, pronation along with PROM Elbow flexion and  extension.Ther ex: Wrist extension isometrics with wrist in neutral 2 x 10 with 5 second holds. Wrist flexion/supination/pronation with 2# weight- 2 x 10 in seated with verbal and tactile cueing to prevent wrist extension. Elbow flexion with 2# weight- 2 x 10 in seated. Shoulder flexion and abduction in standing with 2# weight-2 x 10 with verbal cueing to keep elbows straight. Active wrist extension in pronated position 2 x 10.    Pt response to treatment for medical necessity: Pt will benefit from skilled PT to increase elbow/wirst PROM/AROM in order to return to ADLs like lifting groceries. Pt  will benefit manual techniques in order to break up scar tissue, decrease pain, and increase mobility.           PT Long Term Goals - 05/14/15 1716    PT LONG TERM GOAL #1   Title Pt will increase R elbow flex PROM to >125 deg. in order to return to improve mobility.   Baseline R elbow flex 110 on 2/8   Time 4   Period Weeks   Status New   PT LONG TERM GOAL #2   Title Pt will improve self-perceived QuickDash score to <50% in order to return to knitting.   Baseline 91% on 2/8   Time 4   Period Weeks   Status New   PT LONG TERM GOAL #3   Title Pt will increase R shoulder abd MMT to 5/5 in order to complete work tasks.   Baseline R shoulder abd: 4+/5 on 2/8   Time 4   Period Weeks   Status New   PT LONG TERM GOAL #4   Title Pt will increase R wrist extension AROM to >55 deg. in order to return to knitting.   Baseline AROM wrist not measured yet   Time 4   Period Weeks   Status New   PT LONG TERM GOAL #5   Title Pt will report <2/10 R elbow pain at worst in order to complete household activities with ease.   Baseline 3-4/10 on 2/8   Time 4   Period Weeks   Status New               Plan - 06/05/15 1211    Clinical Impression Statement Pt reports some tightness with wrist flexion, especially at end-range. Pt demonstrated good form and ROM during pronation and supination. Pt required cues to keep elbows at her side during bicep curls. Pt was progressed to AROM wrist extension and tolerated well with no pain but minimal tightness.    Pt will benefit from skilled therapeutic intervention in order to improve on the following deficits Decreased mobility;Decreased strength;Decreased scar mobility;Decreased range of motion;Hypomobility;Impaired flexibility;Impaired UE functional use;Pain;Increased fascial restricitons   Rehab Potential Good   PT Frequency 2x / week   PT Duration 4 weeks   PT Treatment/Interventions ADLs/Self Care Home Management;Cryotherapy;Electrical  Stimulation;Iontophoresis 4mg /ml Dexamethasone;Moist Heat;Functional mobility training;Therapeutic activities;Therapeutic exercise;Patient/family education;Manual techniques;Passive range of motion;Ultrasound   PT Next Visit Plan PROM of elbow/Isometrics/scar massage/return to functional activities, isotonics of elbow, wrist, shoulder   PT Home Exercise Plan see above   Consulted and Agree with Plan of Care Patient        Problem List Patient Active Problem List   Diagnosis Date Noted  . Health care maintenance 11/21/2014  . History of colonic polyps 11/21/2014  . Dysphagia 11/19/2014  . Trochanteric bursitis of left hip 10/08/2014  . URI (upper respiratory infection) 02/10/2014  . Pelvic pain in female  12/25/2013  . Personal history of ovarian cyst 12/25/2013  . Change in bowel movement 11/11/2013  . breast tenderness 09/29/2013  . Dysphagia, unspecified(787.20) 06/12/2012  . Barrett's esophagus 06/12/2012  . Environmental allergies 02/13/2012  . Enlarged thyroid 02/13/2012  . GERD (gastroesophageal reflux disease) 02/08/2012  . Leukopenia 02/08/2012  . Fatigue 02/08/2012  . Hypercholesterolemia 02/08/2012   Pura Spice, PT, DPT # [phone]  and Dorice Lamas, SPT 06/05/2015, 12:19 PM  [facility] [address] [place], [place], [address] Phone: [phone]   Fax:  [phone]  Name: Kelly Massey MRN: [id number] Date of Birth: 06/14/1965

## 2015-06-10 ENCOUNTER — Ambulatory Visit: Payer: 59 | Admitting: Physical Therapy

## 2015-06-10 DIAGNOSIS — M25521 Pain in right elbow: Secondary | ICD-10-CM | POA: Diagnosis not present

## 2015-06-10 DIAGNOSIS — M6281 Muscle weakness (generalized): Secondary | ICD-10-CM

## 2015-06-10 DIAGNOSIS — M25511 Pain in right shoulder: Secondary | ICD-10-CM

## 2015-06-10 DIAGNOSIS — M25621 Stiffness of right elbow, not elsewhere classified: Secondary | ICD-10-CM

## 2015-06-10 NOTE — Therapy (Signed)
[place] [facility] [address] [place], [place], [address] Phone: [phone]   Fax:  [phone]  Physical Therapy Treatment  Patient Details  Name: Kelly Massey MRN: [id number] Date of Birth: 1965/10/05 Referring Provider: Dr. Percell Miller  Encounter Date: 06/10/2015      PT End of Session - 06/10/15 1129    Visit Number 7   Number of Visits 8   Date for PT Re-Evaluation 06/11/15   PT Start Time 0813   PT Stop Time 0909   PT Time Calculation (min) 56 min   Activity Tolerance Patient tolerated treatment well   Behavior During Therapy [facility] for tasks assessed/performed      Past Medical History  Diagnosis Date  . Hyperlipidemia     ? no meds - diet controlled  . Colon polyps   . HSV infection     History  . GERD (gastroesophageal reflux disease) 10/12/10    EGD, positive H. pylori  . Anemia   . Post-operative nausea and vomiting   . Seasonal allergies   . Arthritis     knees - no meds    Past Surgical History  Procedure Laterality Date  . Shoulder arthroscopy Left   . Dilation and curettage of uterus      SAB  . Abdominal hysterectomy    . Ovarian cyst removal  2004    laparotomy -left  . Esophagogastroduodenoscopy N/A 07/24/2012    Procedure: ESOPHAGOGASTRODUODENOSCOPY (EGD);  Surgeon: Inda Castle, MD;  Location: [facility];  Service: Endoscopy;  Laterality: N/A;  . Bravo ph study N/A 07/24/2012    Procedure: BRAVO [place] STUDY;  Surgeon: Inda Castle, MD;  [facility]: [facility];  Service: Endoscopy;  Laterality: N/A;  . Balloon dilation N/A 07/24/2012    Procedure: BALLOON DILATION;  Surgeon: Inda Castle, MD;  Location: [facility];  Service: Endoscopy;  Laterality: N/A;  . Wisdom tooth extraction    . Robotic assisted salpingo oopherectomy Left 03/21/2014    Procedure:  ROBOTIC ASSISTED LEFT OOPHORECTOMY;  Surgeon: Marvene Staff, MD;  Location: [facility];  Service: Gynecology;  Laterality: Left;  . Robotic  assisted laparoscopic lysis of adhesion N/A 03/21/2014    Procedure: ROBOTIC ASSISTED LAPAROSCOPIC EXTENSIVE LYSIS OF ADHESION (1 Hour);  Surgeon: Marvene Staff, MD;  Location: [facility];  Service: Gynecology;  Laterality: N/A;  . Tennis elbow release/nirschel procedure Right 05/01/2015    Procedure: RIGHT ELBOW DEBRIDEMENT AND TENDON REPAIR;  Surgeon: [name], MD;  Location: [place];  Service: Orthopedics;  Laterality: Right;    There were no vitals filed for this visit.  Visit Diagnosis:  Right elbow pain  Right shoulder pain  Muscle right arm weakness  Joint stiffness of elbow, right      Subjective Assessment - 06/10/15 1127    Subjective Pt. c/o R elbow sorenss/ stiffness over past few days.  Pt. has been dealing with upper respiratory illness.     Pertinent History Pt. out of work for 3 months per MD order   Limitations Lifting;Writing;House hold activities;Other (comment)   Patient Stated Goals Increase R elbow/ UE ROM and strength to promote return to pain-free work/ household tasks.     Currently in Pain? No/denies     Objective: Manual: Scar massage reasessment.  Pt presents with point tenderness when increased pressure is applied to scar.  Ther ex: B UBE 10 minutes.  WU no charge.  3x15 with 10 second hold yellow  ball dynamometer holding 4PSI.  4x5 combo pattern wrist ext, elbow flex, wrist flex, elbow ext in sitting position with small blue weighted ball.  2-3x30 second body blade B forward holds, R & L serratus holds.  1x alphabet ABCs at wall with nonweighted ball.  Required rest breaks d/t muscle fatigue.  10 minutes ice post-Rx.  SEE MD PROTOCAL  Pt response to treatment for medical necessity: Pt will benefit from skilled PT to increase elbow/wirst PROM/AROM in order to return to hobbies such as knitting. Pt will benefit manual techniques in order to break up scar tissue, decrease pain, and increase mobility.         PT Long Term Goals -  05/14/15 1716    PT LONG TERM GOAL #1   Title Pt will increase R elbow flex PROM to >125 deg. in order to return to improve mobility.   Baseline R elbow flex 110 on 2/8   Time 4   Period Weeks   Status New   PT LONG TERM GOAL #2   Title Pt will improve self-perceived QuickDash score to <50% in order to return to knitting.   Baseline 91% on 2/8   Time 4   Period Weeks   Status New   PT LONG TERM GOAL #3   Title Pt will increase R shoulder abd MMT to 5/5 in order to complete work tasks.   Baseline R shoulder abd: 4+/5 on 2/8   Time 4   Period Weeks   Status New   PT LONG TERM GOAL #4   Title Pt will increase R wrist extension AROM to >55 deg. in order to return to knitting.   Baseline AROM wrist not measured yet   Time 4   Period Weeks   Status New   PT LONG TERM GOAL #5   Title Pt will report <2/10 R elbow pain at worst in order to complete household activities with ease.   Baseline 3-4/10 on 2/8   Time 4   Period Weeks   Status New            Plan - 06/10/15 1135    Clinical Impression Statement Pt presents with R elbow ROM WFL, reporting a "pulling" sensation at end range (all wrist and elbow movement).  Pt presents with dectreased strength (L = 41.6; R = 17.3) was educated on isotonic elbow, and wrist exercises.  Pt presents with decreased grip strength and was educated on isomeetric grip exercises.    Pt will benefit from skilled therapeutic intervention in order to improve on the following deficits Decreased mobility;Decreased strength;Decreased scar mobility;Decreased range of motion;Hypomobility;Impaired flexibility;Impaired UE functional use;Pain;Increased fascial restricitons   Rehab Potential Good   PT Frequency 2x / week   PT Duration 4 weeks   PT Treatment/Interventions ADLs/Self Care Home Management;Cryotherapy;Electrical Stimulation;Iontophoresis 4mg /ml Dexamethasone;Moist Heat;Functional mobility training;Therapeutic activities;Therapeutic  exercise;Patient/family education;Manual techniques;Passive range of motion;Ultrasound   PT Next Visit Plan Follow MD protocol.  Issue new HEP.  Check goals/Recert   PT Home Exercise Plan see above   Consulted and Agree with Plan of Care Patient        Problem List Patient Active Problem List   Diagnosis Date Noted  . Health care maintenance 11/21/2014  . History of colonic polyps 11/21/2014  . Dysphagia 11/19/2014  . Trochanteric bursitis of left hip 10/08/2014  . URI (upper respiratory infection) 02/10/2014  . Pelvic pain in female 12/25/2013  . Personal history of ovarian cyst 12/25/2013  . Change in bowel  movement 11/11/2013  . breast tenderness 09/29/2013  . Dysphagia, unspecified(787.20) 06/12/2012  . Barrett's esophagus 06/12/2012  . Environmental allergies 02/13/2012  . Enlarged thyroid 02/13/2012  . GERD (gastroesophageal reflux disease) 02/08/2012  . Leukopenia 02/08/2012  . Fatigue 02/08/2012  . Hypercholesterolemia 02/08/2012   Pura Spice, PT, DPT # [phone]   06/10/2015, 11:40 AM  [place] [facility] [address] [place], [place], [address] Phone: [phone]   Fax:  [phone]  Name: DONETA ANNUNZIATA MRN: [id number] Date of Birth: May 08, 1965

## 2015-06-12 ENCOUNTER — Encounter: Payer: Self-pay | Admitting: Physical Therapy

## 2015-06-12 ENCOUNTER — Ambulatory Visit: Payer: 59 | Admitting: Physical Therapy

## 2015-06-12 DIAGNOSIS — M25621 Stiffness of right elbow, not elsewhere classified: Secondary | ICD-10-CM

## 2015-06-12 DIAGNOSIS — M25511 Pain in right shoulder: Secondary | ICD-10-CM

## 2015-06-12 DIAGNOSIS — M6281 Muscle weakness (generalized): Secondary | ICD-10-CM

## 2015-06-12 DIAGNOSIS — M25521 Pain in right elbow: Secondary | ICD-10-CM | POA: Diagnosis not present

## 2015-06-12 NOTE — Therapy (Signed)
[place] [facility] [address] [place], [place], [address] Phone: [phone]   Fax:  [phone]  Physical Therapy Treatment  Patient Details  Name: Kelly Massey MRN: [id number] Date of Birth: 01-21-1966 Referring Provider: Dr. Percell Miller  Encounter Date: 06/12/2015      PT End of Session - 06/12/15 1010    Visit Number 8   Number of Visits 16   Date for PT Re-Evaluation 07/10/15   PT Start Time 0814   PT Stop Time 0919   PT Time Calculation (min) 65 min   Activity Tolerance Patient tolerated treatment well   Behavior During Therapy [facility] for tasks assessed/performed      Past Medical History  Diagnosis Date  . Hyperlipidemia     ? no meds - diet controlled  . Colon polyps   . HSV infection     History  . GERD (gastroesophageal reflux disease) 10/12/10    EGD, positive H. pylori  . Anemia   . Post-operative nausea and vomiting   . Seasonal allergies   . Arthritis     knees - no meds    Past Surgical History  Procedure Laterality Date  . Shoulder arthroscopy Left   . Dilation and curettage of uterus      SAB  . Abdominal hysterectomy    . Ovarian cyst removal  2004    laparotomy -left  . Esophagogastroduodenoscopy N/A 07/24/2012    Procedure: ESOPHAGOGASTRODUODENOSCOPY (EGD);  Surgeon: Inda Castle, MD;  Location: [facility];  Service: Endoscopy;  Laterality: N/A;  . Bravo ph study N/A 07/24/2012    Procedure: BRAVO [place] STUDY;  Surgeon: Inda Castle, MD;  [facility];  Service: Endoscopy;  Laterality: N/A;  . Balloon dilation N/A 07/24/2012    Procedure: BALLOON DILATION;  Surgeon: Inda Castle, MD;  Location: [facility];  Service: Endoscopy;  Laterality: N/A;  . Wisdom tooth extraction    . Robotic assisted salpingo oopherectomy Left 03/21/2014    Procedure:  ROBOTIC ASSISTED LEFT OOPHORECTOMY;  Surgeon: Marvene Staff, MD;  Location: [facility];  Service: Gynecology;  Laterality: Left;  . Robotic  assisted laparoscopic lysis of adhesion N/A 03/21/2014    Procedure: ROBOTIC ASSISTED LAPAROSCOPIC EXTENSIVE LYSIS OF ADHESION (1 Hour);  Surgeon: Marvene Staff, MD;  Location: [facility];  Service: Gynecology;  Laterality: N/A;  . Tennis elbow release/nirschel procedure Right 05/01/2015    Procedure: RIGHT ELBOW DEBRIDEMENT AND TENDON REPAIR;  Surgeon: [name], MD;  Location: [place];  Service: Orthopedics;  Laterality: Right;    There were no vitals filed for this visit.  Visit Diagnosis:  Right elbow pain  Right shoulder pain  Muscle right arm weakness  Joint stiffness of elbow, right      Subjective Assessment - 06/12/15 1009    Subjective Pt. reports she is doing well with minimal R elbow discomfort.  Pt. did c/o 1 or 2 episodes of "shooting" pain since last tx. which resolved quickly.     Pertinent History Pt. out of work for 3 months per MD order   Limitations Lifting;Writing;House hold activities;Other (comment)   Patient Stated Goals Increase R elbow/ UE ROM and strength to promote return to pain-free work/ household tasks.     Currently in Pain? No/denies      OBJECTIVE: 10 minutes scar massage with Extractor/ manual technique.  B UBE 4 min. F/b.  3x ABCs with small weighted ball and elbow in full  ext, required v.c. To keep arm straight.  30x supination/pronation with hammer, requiring v.c. To perform the exercise slowly.  3x10 with 10 sec hold yellow grip bladder holding at 3PSI.  1x20 triceps extension at 20 lbs, and 1x5 triceps extension at 40 lbs on Nautilus; required v.c. To keep elbows at side.  10 minutes cryotherapy to elbow.  Pt educated on how to make home made ice pack. Discussed HEP/ daily tasks.     Pt response for medical necessity:  Pt requires skilled PT treatment to increase strength, improve ROM, and increase scar mobility.  Good progression with ROM/ strength training.        PT Long Term Goals - 06/12/15 0920    PT LONG  TERM GOAL #1   Title Pt will increase R elbow flex PROM to >125 deg. in order to return to improve mobility.   Baseline 149 degrees on 06/12/2015   Time 4   Period Weeks   Status Achieved   PT LONG TERM GOAL #2   Title Pt will improve self-perceived QuickDash score to <50% in order to return to knitting.   Baseline 29% on 06/12/2015   Time 4   Period Weeks   Status Achieved   PT LONG TERM GOAL #3   Title Pt will increase R shoulder abd MMT to 5/5 in order to complete work tasks.   Baseline R shoulder abd: 4+/5 on 2/8   Time 4   Period Weeks   Status On-going   PT LONG TERM GOAL #4   Title Pt will increase R wrist extension AROM to >55 deg. in order to return to knitting.   Baseline 60 degrees on 06/12/2015   Time 4   Period Weeks   Status Achieved   PT LONG TERM GOAL #5   Title Pt will report <2/10 R elbow pain at worst in order to complete household activities with ease.   Baseline 5-6/10 pain 1 time on 06/12/2015   Time 4   Period Weeks   Status On-going   Additional Long Term Goals   Additional Long Term Goals Yes   PT LONG TERM GOAL #6   Title Pt will complete self-percieved QuickDASH questionnaire at <15% disability to improve ability to perform ADLs with R shoulder.    Baseline 29% on 06/12/2015   Time 4   Period Weeks   Status New   PT LONG TERM GOAL #7   Title Pt will demonstrate AROM wrist extension with goniometer at 70 degrees to improve wrist ROM.   Baseline 60 degrees on 06/12/2015   Time 4   Period Weeks   Status New   PT LONG TERM GOAL #8   Title Pt will demonstrate AROM elbow flex with goniometer at 150 degrees to improve pt's ability to perform work tasks.    Baseline 140 degrees on 06/12/2015   Time 4   Period Weeks   Status New            Plan - 06/12/15 0935    Clinical Impression Statement Pt presents with decreased scar mobility at superior portion of scar.  Pt presents with decreased shoulder and elbow strength during exercises.  Pt  presents with improved elbow and wrist mobility (elbow flex PROM: 149; elbow flex AROM: 140; wrist ext AROM: 60), and improve QuickDASH score (29%).  Pt. is currently 6 weeks s/p surgery and following MD protocol on a consistent basis.  PT recommends continued PT to promote return to work with no  elbow pain or limitations.     Pt will benefit from skilled therapeutic intervention in order to improve on the following deficits Decreased mobility;Decreased strength;Decreased scar mobility;Decreased range of motion;Hypomobility;Impaired flexibility;Impaired UE functional use;Pain;Increased fascial restricitons   Rehab Potential Good   PT Frequency 2x / week   PT Duration 4 weeks   PT Treatment/Interventions ADLs/Self Care Home Management;Cryotherapy;Electrical Stimulation;Iontophoresis 4mg /ml Dexamethasone;Moist Heat;Functional mobility training;Therapeutic activities;Therapeutic exercise;Patient/family education;Manual techniques;Passive range of motion;Ultrasound   PT Next Visit Plan Follow MD protocol.  Issue new strength HEP.     PT Home Exercise Plan see above        Problem List Patient Active Problem List   Diagnosis Date Noted  . Health care maintenance 11/21/2014  . History of colonic polyps 11/21/2014  . Dysphagia 11/19/2014  . Trochanteric bursitis of left hip 10/08/2014  . URI (upper respiratory infection) 02/10/2014  . Pelvic pain in female 12/25/2013  . Personal history of ovarian cyst 12/25/2013  . Change in bowel movement 11/11/2013  . breast tenderness 09/29/2013  . Dysphagia, unspecified(787.20) 06/12/2012  . Barrett's esophagus 06/12/2012  . Environmental allergies 02/13/2012  . Enlarged thyroid 02/13/2012  . GERD (gastroesophageal reflux disease) 02/08/2012  . Leukopenia 02/08/2012  . Fatigue 02/08/2012  . Hypercholesterolemia 02/08/2012   Pura Spice, PT, DPT # [phone]   06/12/2015, 10:28 AM  [place] [facility] [address] [place], [place], [address] Phone: [phone]   Fax:  [phone]  Name: Kelly Massey MRN: [id number] Date of Birth: Jul 16, 1965

## 2015-06-17 ENCOUNTER — Other Ambulatory Visit: Payer: Self-pay | Admitting: Internal Medicine

## 2015-06-17 ENCOUNTER — Encounter: Payer: 59 | Admitting: Physical Therapy

## 2015-06-17 NOTE — Telephone Encounter (Signed)
Please advise why this medication cannot be filled?  Pt last seen this month by Flonnie Overman, Last seen by Dr Nicki Reaper in Nov.  Medication is flagged as a long term medication.

## 2015-06-17 NOTE — Telephone Encounter (Signed)
Pt needs to schedule a f/u before getting refills on her medication

## 2015-06-19 ENCOUNTER — Encounter: Payer: 59 | Admitting: Physical Therapy

## 2015-06-19 ENCOUNTER — Other Ambulatory Visit: Payer: Self-pay | Admitting: Internal Medicine

## 2015-06-19 NOTE — Telephone Encounter (Signed)
Pt called to check the status of the medication of DEXILANT 60 MG capsule. Pharmacy is [id number] - [place], [place]. Call pt @ [phone]. Thank you!

## 2015-06-19 NOTE — Telephone Encounter (Signed)
Please advise, thanks.

## 2015-06-20 MED ORDER — DEXLANSOPRAZOLE 60 MG PO CPDR: DELAYED_RELEASE_CAPSULE | ORAL | Status: DC

## 2015-06-20 NOTE — Telephone Encounter (Signed)
rx sent in for dexilant #30 with 2 refills.

## 2015-06-24 ENCOUNTER — Ambulatory Visit: Payer: 59 | Admitting: Physical Therapy

## 2015-06-24 ENCOUNTER — Other Ambulatory Visit: Payer: Self-pay

## 2015-06-24 DIAGNOSIS — M25521 Pain in right elbow: Secondary | ICD-10-CM | POA: Diagnosis not present

## 2015-06-24 DIAGNOSIS — M25511 Pain in right shoulder: Secondary | ICD-10-CM

## 2015-06-24 DIAGNOSIS — Z1231 Encounter for screening mammogram for malignant neoplasm of breast: Secondary | ICD-10-CM

## 2015-06-24 DIAGNOSIS — M25621 Stiffness of right elbow, not elsewhere classified: Secondary | ICD-10-CM

## 2015-06-24 DIAGNOSIS — M6281 Muscle weakness (generalized): Secondary | ICD-10-CM

## 2015-06-24 NOTE — Therapy (Signed)
[facility] [address] [place], [place], [address] Phone: [phone]   Fax:  [phone]  Physical Therapy Treatment  Patient Details  Name: Kelly Massey MRN: [id number] Date of Birth: Feb 06, 1966 Referring Provider: Dr. Percell Miller  Encounter Date: 06/24/2015      PT End of Session - 06/24/15 1115    Visit Number 9   Number of Visits 16   Date for PT Re-Evaluation 07/10/15   PT Start Time 0811   PT Stop Time 0912   PT Time Calculation (min) 61 min   Activity Tolerance Patient tolerated treatment well   Behavior During Therapy [facility] for tasks assessed/performed      Past Medical History  Diagnosis Date  . Hyperlipidemia     ? no meds - diet controlled  . Colon polyps   . HSV infection     History  . GERD (gastroesophageal reflux disease) 10/12/10    EGD, positive H. pylori  . Anemia   . Post-operative nausea and vomiting   . Seasonal allergies   . Arthritis     knees - no meds    Past Surgical History  Procedure Laterality Date  . Shoulder arthroscopy Left   . Dilation and curettage of uterus      SAB  . Abdominal hysterectomy    . Ovarian cyst removal  2004    laparotomy -left  . Esophagogastroduodenoscopy N/A 07/24/2012    Procedure: ESOPHAGOGASTRODUODENOSCOPY (EGD);  Surgeon: Inda Castle, MD;  Location: [facility];  Service: Endoscopy;  Laterality: N/A;  . Bravo ph study N/A 07/24/2012    Procedure: BRAVO [place] STUDY;  Surgeon: Inda Castle, MD;  Location: [facility];  Service: Endoscopy;  Laterality: N/A;  . Balloon dilation N/A 07/24/2012    Procedure: BALLOON DILATION;  Surgeon: Inda Castle, MD;  Location: [facility];  Service: Endoscopy;  Laterality: N/A;  . Wisdom tooth extraction    . Robotic assisted salpingo oopherectomy Left 03/21/2014    Procedure:  ROBOTIC ASSISTED LEFT OOPHORECTOMY;  Surgeon: Marvene Staff, MD;  Location: [facility];  Service: Gynecology;  Laterality: Left;  . Robotic  assisted laparoscopic lysis of adhesion N/A 03/21/2014    Procedure: ROBOTIC ASSISTED LAPAROSCOPIC EXTENSIVE LYSIS OF ADHESION (1 Hour);  Surgeon: Marvene Staff, MD;  Location: [facility];  Service: Gynecology;  Laterality: N/A;  . Tennis elbow release/nirschel procedure Right 05/01/2015    Procedure: RIGHT ELBOW DEBRIDEMENT AND TENDON REPAIR;  Surgeon: [name], MD;  Location: [facility];  Service: Orthopedics;  Laterality: Right;    There were no vitals filed for this visit.  Visit Diagnosis:  Right elbow pain  Right shoulder pain  Muscle right arm weakness  Joint stiffness of elbow, right      Subjective Assessment - 06/24/15 1110    Subjective Pt reports that she had a good birthday celebration and enjoyed her time off from therapy last week.  Pt states having tenderness on her "elbow bone".  Pt has no difficulty with everyday ADLs that involve utilizing her R elbow.  Pt's physician visit went well and pt is to return to work Aug 04, 2015.    Pertinent History Pt. out of work for 3 months per MD order   Limitations Lifting;Writing;House hold activities;Other (comment)   Patient Stated Goals Increase R elbow/ UE ROM and strength to promote return to pain-free work/ household tasks.     Currently in Pain? No/denies   Pain  Score 1    Pain Location Elbow   Pain Orientation Right   Pain Type Surgical pain   Pain Onset 1 to 4 weeks ago   Pain Frequency Constant   Multiple Pain Sites No       OBJECTIVE:  There Ex: Pt performed wrist flex and ext with 3lb weight in a seated position with R arm supported by table, body blade forward in neutral, forward in pronation, and in shoulder abduction requiring v.c. To keep her arm straight while using body blade, overhead holds in full shoulder flexion with medium weighted ball for 5-10 seconds.  Pt c/o a pulling and painful sensation with overhead holds, and these were discontinued to avoid further irritation.  Pt molded  blue therapy putty for 10 minutes in a seated position.  Pt was given therapy putty to take home with her for further use.  10-15x small box lifts with 10lbs of weight maintaining proper body mechanics, and 10-15x modified box lifts with B elbow flex at end standing position with 10lbs weight.  10 minutes on UE ergometer 4 minutes forward and 4 minutes backward.  Manual therapy: soft tissue scar massage was performed to pt's scar with The Extractor.     Pt response for medical necessity: Pt requires skilled PT services to increase R UE strength, improve work functional tasks, and increase scar mobility.          PT Education - 06/24/15 1114    Education provided Yes   Education Details Educated on using blue putty for hand strength, and on proper lifting mechanics for the mulch work that pt plans to do in her yard this week.  Educated on pain vs muscular contraction.    Person(s) Educated Patient   Methods Explanation;Demonstration   Comprehension Verbalized understanding;Returned demonstration             PT Long Term Goals - 06/12/15 0920    PT LONG TERM GOAL #1   Title Pt will increase R elbow flex PROM to >125 deg. in order to return to improve mobility.   Baseline 149 degrees on 06/12/2015   Time 4   Period Weeks   Status Achieved   PT LONG TERM GOAL #2   Title Pt will improve self-perceived QuickDash score to <50% in order to return to knitting.   Baseline 29% on 06/12/2015   Time 4   Period Weeks   Status Achieved   PT LONG TERM GOAL #3   Title Pt will increase R shoulder abd MMT to 5/5 in order to complete work tasks.   Baseline R shoulder abd: 4+/5 on 2/8   Time 4   Period Weeks   Status On-going   PT LONG TERM GOAL #4   Title Pt will increase R wrist extension AROM to >55 deg. in order to return to knitting.   Baseline 60 degrees on 06/12/2015   Time 4   Period Weeks   Status Achieved   PT LONG TERM GOAL #5   Title Pt will report <2/10 R elbow pain at worst  in order to complete household activities with ease.   Baseline 5-6/10 pain 1 time on 06/12/2015   Time 4   Period Weeks   Status On-going   Additional Long Term Goals   Additional Long Term Goals Yes   PT LONG TERM GOAL #6   Title Pt will complete self-percieved QuickDASH questionnaire at <15% disability to improve ability to perform ADLs with R shoulder.  Baseline 29% on 06/12/2015   Time 4   Period Weeks   Status New   PT LONG TERM GOAL #7   Title Pt will demonstrate AROM wrist extension with goniometer at 70 degrees to improve wrist ROM.   Baseline 60 degrees on 06/12/2015   Time 4   Period Weeks   Status New   PT LONG TERM GOAL #8   Title Pt will demonstrate AROM elbow flex with goniometer at 150 degrees to improve pt's ability to perform work tasks.    Baseline 140 degrees on 06/12/2015   Time 4   Period Weeks   Status New            Plan - 06/24/15 1118    Clinical Impression Statement Pt presents with improved scar mobility at superior portion of the scar as demonstrated by increased mobility with The Extractor.  Pt presents with decreased R shoulder, elbow, and wrist strength during therapeutic exercise.  Pt currently 8 weeks s/p surgery and following MD protocol on a consistent basis.     Pt will benefit from skilled therapeutic intervention in order to improve on the following deficits Decreased mobility;Decreased strength;Decreased scar mobility;Decreased range of motion;Hypomobility;Impaired flexibility;Impaired UE functional use;Pain;Increased fascial restricitons   Rehab Potential Good   PT Frequency 2x / week   PT Duration 4 weeks   PT Treatment/Interventions ADLs/Self Care Home Management;Cryotherapy;Electrical Stimulation;Iontophoresis 4mg /ml Dexamethasone;Moist Heat;Functional mobility training;Therapeutic activities;Therapeutic exercise;Patient/family education;Manual techniques;Passive range of motion;Ultrasound   PT Next Visit Plan Follow MD protocol.   Continue with strengthening exercises of pt's R UE within pain-free range.    PT Home Exercise Plan see above   Consulted and Agree with Plan of Care Patient        Problem List Patient Active Problem List   Diagnosis Date Noted  . Health care maintenance 11/21/2014  . History of colonic polyps 11/21/2014  . Dysphagia 11/19/2014  . Trochanteric bursitis of left hip 10/08/2014  . URI (upper respiratory infection) 02/10/2014  . Pelvic pain in female 12/25/2013  . Personal history of ovarian cyst 12/25/2013  . Change in bowel movement 11/11/2013  . breast tenderness 09/29/2013  . Dysphagia, unspecified(787.20) 06/12/2012  . Barrett's esophagus 06/12/2012  . Environmental allergies 02/13/2012  . Enlarged thyroid 02/13/2012  . GERD (gastroesophageal reflux disease) 02/08/2012  . Leukopenia 02/08/2012  . Fatigue 02/08/2012  . Hypercholesterolemia 02/08/2012   Hermelinda Dellen, SPT  Pura Spice, PT, DPT # [id number] 06/25/2015, 2:31 PM  [facility] [address] [place], [place], [address] Phone: [phone]   Fax:  [phone]  Name: Kelly Massey MRN: [id number] Date of Birth: 01/24/1966

## 2015-06-26 ENCOUNTER — Ambulatory Visit: Payer: 59 | Admitting: Physical Therapy

## 2015-06-26 DIAGNOSIS — M25521 Pain in right elbow: Secondary | ICD-10-CM | POA: Diagnosis not present

## 2015-06-26 DIAGNOSIS — M25511 Pain in right shoulder: Secondary | ICD-10-CM

## 2015-06-26 DIAGNOSIS — M6281 Muscle weakness (generalized): Secondary | ICD-10-CM

## 2015-06-26 DIAGNOSIS — M25621 Stiffness of right elbow, not elsewhere classified: Secondary | ICD-10-CM

## 2015-06-26 NOTE — Therapy (Signed)
[facility] [address]. [place], [place], [address] Phone: [phone]   Fax:  [phone]  Physical Therapy Treatment  Patient Details  Name: LORILYN MONCAYO MRN: [id number] Date of Birth: [birth year] Referring Provider: Dr. Percell Miller  Encounter Date: 06/26/2015      PT End of Session - 06/27/15 1919    Visit Number 10   Number of Visits 16   Date for PT Re-Evaluation 07/10/15   PT Start Time 0822   PT Stop Time 0919   PT Time Calculation (min) 57 min   Activity Tolerance Patient tolerated treatment well   Behavior During Therapy [facility] for tasks assessed/performed      Past Medical History  Diagnosis Date  . Hyperlipidemia     ? no meds - diet controlled  . Colon polyps   . HSV infection     History  . GERD (gastroesophageal reflux disease) 10/12/10    EGD, positive H. pylori  . Anemia   . Post-operative nausea and vomiting   . Seasonal allergies   . Arthritis     knees - no meds    Past Surgical History  Procedure Laterality Date  . Shoulder arthroscopy Left   . Dilation and curettage of uterus      SAB  . Abdominal hysterectomy    . Ovarian cyst removal  2004    laparotomy -left  . Esophagogastroduodenoscopy N/A 07/24/2012    Procedure: ESOPHAGOGASTRODUODENOSCOPY (EGD);  Surgeon: Inda Castle, MD;  Location: [facility];  Service: Endoscopy;  Laterality: N/A;  . Bravo ph study N/A 07/24/2012    Procedure: BRAVO [place] STUDY;  Surgeon: Inda Castle, MD;  Location: [facility];  Service: Endoscopy;  Laterality: N/A;  . Balloon dilation N/A 07/24/2012    Procedure: BALLOON DILATION;  Surgeon: Inda Castle, MD;  Location: [facility];  Service: Endoscopy;  Laterality: N/A;  . Wisdom tooth extraction    . Robotic assisted salpingo oopherectomy Left 03/21/2014    Procedure:  ROBOTIC ASSISTED LEFT OOPHORECTOMY;  Surgeon: Marvene Staff, MD;  Location: [facility];  Service: Gynecology;  Laterality: Left;  . Robotic  assisted laparoscopic lysis of adhesion N/A 03/21/2014    Procedure: ROBOTIC ASSISTED LAPAROSCOPIC EXTENSIVE LYSIS OF ADHESION (1 Hour);  Surgeon: Marvene Staff, MD;  Location: [facility];  Service: Gynecology;  Laterality: N/A;  . Tennis elbow release/nirschel procedure Right 05/01/2015    Procedure: RIGHT ELBOW DEBRIDEMENT AND TENDON REPAIR;  Surgeon: [name], MD;  Location: [facility];  Service: Orthopedics;  Laterality: Right;    There were no vitals filed for this visit.  Visit Diagnosis:  Right elbow pain  Right shoulder pain  Muscle right arm weakness  Joint stiffness of elbow, right      Subjective Assessment - 06/27/15 1917    Subjective Pt. states her R elbow did fine after last tx. but her quad were exhausted/sore making walking and stair climbing difficult.  Pt. states floor to waist lifting really made quads sore.  Pt. states she is really out of shape.     Pertinent History Pt. out of work for 3 months per MD order   Limitations Lifting;Writing;House hold activities;Other (comment)   Patient Stated Goals Increase R elbow/ UE ROM and strength to promote return to pain-free work/ household tasks.     Currently in Pain? No/denies      R grip strength: 35#.  L grip 62#.    OBJECTIVE:  There.ex.: Nustep 12 min. L6 B UE/LE (warm-up/no charge).  Standing using ball to write the alphabet 2 x 10, PNF patterns D1 and D2 2 x 10. Nautilus: Standing Lat pull down 30#/ tricep ext. 30#/scap retraction 30#/bicep curls 20# Manual isometric R wrist/elbow/sup./pron./radial and ulnar deviation 5x all planes (mod-max. Resistance per MD protocol).  Grip strength (pain tolerable)- 35# on R.  17# box. Lifting from knee level/ B carrying around gym. Reviewed HEP.  Ice to R elbow after tx. In seated posture.         Pt response for medical necessity:  Progressing well with no pain in R elbow/  Moderate generalized muscle fatigue, esp. In B LE.  Pt. Will continue to  benefit from strength training/ progression.         PT Long Term Goals - 06/12/15 0920    PT LONG TERM GOAL #1   Title Pt will increase R elbow flex PROM to >125 deg. in order to return to improve mobility.   Baseline 149 degrees on 06/12/2015   Time 4   Period Weeks   Status Achieved   PT LONG TERM GOAL #2   Title Pt will improve self-perceived QuickDash score to <50% in order to return to knitting.   Baseline 29% on 06/12/2015   Time 4   Period Weeks   Status Achieved   PT LONG TERM GOAL #3   Title Pt will increase R shoulder abd MMT to 5/5 in order to complete work tasks.   Baseline R shoulder abd: 4+/5 on 2/8   Time 4   Period Weeks   Status On-going   PT LONG TERM GOAL #4   Title Pt will increase R wrist extension AROM to >55 deg. in order to return to knitting.   Baseline 60 degrees on 06/12/2015   Time 4   Period Weeks   Status Achieved   PT LONG TERM GOAL #5   Title Pt will report <2/10 R elbow pain at worst in order to complete household activities with ease.   Baseline 5-6/10 pain 1 time on 06/12/2015   Time 4   Period Weeks   Status On-going   Additional Long Term Goals   Additional Long Term Goals Yes   PT LONG TERM GOAL #6   Title Pt will complete self-percieved QuickDASH questionnaire at <15% disability to improve ability to perform ADLs with R shoulder.    Baseline 29% on 06/12/2015   Time 4   Period Weeks   Status New   PT LONG TERM GOAL #7   Title Pt will demonstrate AROM wrist extension with goniometer at 70 degrees to improve wrist ROM.   Baseline 60 degrees on 06/12/2015   Time 4   Period Weeks   Status New   PT LONG TERM GOAL #8   Title Pt will demonstrate AROM elbow flex with goniometer at 150 degrees to improve pt's ability to perform work tasks.    Baseline 140 degrees on 06/12/2015   Time 4   Period Weeks   Status New               Plan - 06/27/15 1920    Clinical Impression Statement Pt. presents with full R elbow/wrist  AROM (all planes) with strength deficits apparent, esp. grip.  Good scar mobility/ no tenderness in elbow.  Pt. will continue to benefit from strength training with work-related task focus/ progression.     Pt will benefit from skilled therapeutic intervention in order  to improve on the following deficits Decreased mobility;Decreased strength;Decreased scar mobility;Decreased range of motion;Hypomobility;Impaired flexibility;Impaired UE functional use;Pain;Increased fascial restricitons   Rehab Potential Good   PT Frequency 2x / week   PT Duration 4 weeks   PT Treatment/Interventions ADLs/Self Care Home Management;Cryotherapy;Electrical Stimulation;Iontophoresis 4mg /ml Dexamethasone;Moist Heat;Functional mobility training;Therapeutic activities;Therapeutic exercise;Patient/family education;Manual techniques;Passive range of motion;Ultrasound   PT Next Visit Plan Follow MD protocol.  Continue with strengthening exercises of pt's R UE within pain-free range.    PT Home Exercise Plan see above   Consulted and Agree with Plan of Care Patient        Problem List Patient Active Problem List   Diagnosis Date Noted  . Health care maintenance 11/21/2014  . History of colonic polyps 11/21/2014  . Dysphagia 11/19/2014  . Trochanteric bursitis of left hip 10/08/2014  . URI (upper respiratory infection) 02/10/2014  . Pelvic pain in female 12/25/2013  . Personal history of ovarian cyst 12/25/2013  . Change in bowel movement 11/11/2013  . breast tenderness 09/29/2013  . Dysphagia, unspecified(787.20) 06/12/2012  . Barrett's esophagus 06/12/2012  . Environmental allergies 02/13/2012  . Enlarged thyroid 02/13/2012  . GERD (gastroesophageal reflux disease) 02/08/2012  . Leukopenia 02/08/2012  . Fatigue 02/08/2012  . Hypercholesterolemia 02/08/2012   Pura Spice, PT, DPT # [id number]   06/27/2015, 7:29 PM  [facility] [address] [place],  [place], [address] Phone: [phone]   Fax:  [phone]  Name: ARROW HOUGE MRN: [id number] Date of Birth: 08-24-1965

## 2015-06-30 ENCOUNTER — Ambulatory Visit: Payer: 59 | Admitting: Family Medicine

## 2015-07-01 ENCOUNTER — Ambulatory Visit: Payer: 59 | Admitting: Physical Therapy

## 2015-07-01 DIAGNOSIS — M6281 Muscle weakness (generalized): Secondary | ICD-10-CM

## 2015-07-01 DIAGNOSIS — M25511 Pain in right shoulder: Secondary | ICD-10-CM

## 2015-07-01 DIAGNOSIS — M25621 Stiffness of right elbow, not elsewhere classified: Secondary | ICD-10-CM

## 2015-07-01 DIAGNOSIS — M25521 Pain in right elbow: Secondary | ICD-10-CM | POA: Diagnosis not present

## 2015-07-01 NOTE — Therapy (Signed)
[facility] [address] [place], [place], [address] Phone: [phone]   Fax:  [phone]  Physical Therapy Treatment  Patient Details  Name: Kelly Massey MRN: [id number] Date of Birth: 03-Oct-1965 Referring Provider: Dr. Percell Miller  Encounter Date: 07/01/2015      PT End of Session - 07/01/15 1237    Visit Number 11   Number of Visits 16   Date for PT Re-Evaluation 07/10/15   Authorization - Visit Number 11   Authorization - Number of Visits 20   PT Start Time [id number]   PT Stop Time 0922   PT Time Calculation (min) 48 min      Past Medical History  Diagnosis Date  . Hyperlipidemia     ? no meds - diet controlled  . Colon polyps   . HSV infection     History  . GERD (gastroesophageal reflux disease) 10/12/10    EGD, positive H. pylori  . Anemia   . Post-operative nausea and vomiting   . Seasonal allergies   . Arthritis     knees - no meds    Past Surgical History  Procedure Laterality Date  . Shoulder arthroscopy Left   . Dilation and curettage of uterus      SAB  . Abdominal hysterectomy    . Ovarian cyst removal  2004    laparotomy -left  . Esophagogastroduodenoscopy N/A 07/24/2012    Procedure: ESOPHAGOGASTRODUODENOSCOPY (EGD);  Surgeon: Inda Castle, MD;  Location: [facility];  Service: Endoscopy;  Laterality: N/A;  . Bravo ph study N/A 07/24/2012    Procedure: BRAVO [place] STUDY;  Surgeon: Inda Castle, MD;  Location: [facility];  Service: Endoscopy;  Laterality: N/A;  . Balloon dilation N/A 07/24/2012    Procedure: BALLOON DILATION;  Surgeon: Inda Castle, MD;  Location: [facility];  Service: Endoscopy;  Laterality: N/A;  . Wisdom tooth extraction    . Robotic assisted salpingo oopherectomy Left 03/21/2014    Procedure:  ROBOTIC ASSISTED LEFT OOPHORECTOMY;  Surgeon: Marvene Staff, MD;  Location: [facility];  Service: Gynecology;  Laterality: Left;  . Robotic assisted laparoscopic lysis of adhesion  N/A 03/21/2014    Procedure: ROBOTIC ASSISTED LAPAROSCOPIC EXTENSIVE LYSIS OF ADHESION (1 Hour);  Surgeon: Marvene Staff, MD;  Location: [facility];  Service: Gynecology;  Laterality: N/A;  . Tennis elbow release/nirschel procedure Right 05/01/2015    Procedure: RIGHT ELBOW DEBRIDEMENT AND TENDON REPAIR;  Surgeon: [name], MD;  Location: [place] [facility];  Service: Orthopedics;  Laterality: Right;    There were no vitals filed for this visit.  Visit Diagnosis:  Right elbow pain  Right shoulder pain  Muscle right arm weakness  Joint stiffness of elbow, right      Subjective Assessment - 07/01/15 0909    Subjective Pt reports that she did not end up putting mulch out this past weekend and that she is feeling well.  At treatment conclusion, pt stated that she had to go to a doctor today because her throat was not feeling well.    Pertinent History Pt. out of work for 3 months per MD order   Limitations Lifting;Writing;House hold activities;Other (comment)   Patient Stated Goals Increase R elbow/ UE ROM and strength to promote return to pain-free work/ household tasks.     Currently in Pain? No/denies   Pain Score 0-No pain   Pain Location Elbow   Pain Orientation Right   Pain Type  Surgical pain   Pain Onset 1 to 4 weeks ago   Pain Frequency Constant   Multiple Pain Sites No      OBJECTIVE: 10 minutes UE ergometer.  Neuro ReEd:  Pt performed exercises on Nautilus including tricep extension (20#), bicep curls (20#), shoulder IR (20#), shoulder ER (10#), lat. pull downs 40#), and rows (30#)- 20x each (good technique/ no increase c/o pain).  Performed overhead holds with small weighted ball, and median N arm extension pattern with small weighted ball.  Wrist flex and ext in seated position with arm supported and 4lb weight 30x each.  Discussed HEP progression with resisted ex. In pain-free range.        Pt requires skilled PT services to increase gross R UE strength,  and improve functional mobility.  Pt tolerated treatment well today as evidenced by improvements in overhead stability d/t being able to perform overhead holds with weighted ball without pain.          PT Education - 07/01/15 [id number]    Education provided Yes   Education Details Pt educated on new strengthening exercises for HEP.   Person(s) Educated Patient   Methods Explanation;Demonstration;Verbal cues;Tactile cues;Handout   Comprehension Verbalized understanding;Returned demonstration;Verbal cues required             PT Long Term Goals - 06/12/15 0920    PT LONG TERM GOAL #1   Title Pt will increase R elbow flex PROM to >125 deg. in order to return to improve mobility.   Baseline 149 degrees on 06/12/2015   Time 4   Period Weeks   Status Achieved   PT LONG TERM GOAL #2   Title Pt will improve self-perceived QuickDash score to <50% in order to return to knitting.   Baseline 29% on 06/12/2015   Time 4   Period Weeks   Status Achieved   PT LONG TERM GOAL #3   Title Pt will increase R shoulder abd MMT to 5/5 in order to complete work tasks.   Baseline R shoulder abd: 4+/5 on 2/8   Time 4   Period Weeks   Status On-going   PT LONG TERM GOAL #4   Title Pt will increase R wrist extension AROM to >55 deg. in order to return to knitting.   Baseline 60 degrees on 06/12/2015   Time 4   Period Weeks   Status Achieved   PT LONG TERM GOAL #5   Title Pt will report <2/10 R elbow pain at worst in order to complete household activities with ease.   Baseline 5-6/10 pain 1 time on 06/12/2015   Time 4   Period Weeks   Status On-going   Additional Long Term Goals   Additional Long Term Goals Yes   PT LONG TERM GOAL #6   Title Pt will complete self-percieved QuickDASH questionnaire at <15% disability to improve ability to perform ADLs with R shoulder.    Baseline 29% on 06/12/2015   Time 4   Period Weeks   Status New   PT LONG TERM GOAL #7   Title Pt will demonstrate AROM wrist  extension with goniometer at 70 degrees to improve wrist ROM.   Baseline 60 degrees on 06/12/2015   Time 4   Period Weeks   Status New   PT LONG TERM GOAL #8   Title Pt will demonstrate AROM elbow flex with goniometer at 150 degrees to improve pt's ability to perform work tasks.    Baseline 140 degrees on  06/12/2015   Time 4   Period Weeks   Status New               Plan - 07/01/15 1238    Clinical Impression Statement Pt presents with decreased R shoulder and elbow strength as evidenced by her quick fatigue during therapeutic exercise, but increased stability as evidenced by being able to perform overhead medicine ball holds without increased pain.     Pt will benefit from skilled therapeutic intervention in order to improve on the following deficits Decreased mobility;Decreased strength;Decreased scar mobility;Decreased range of motion;Hypomobility;Impaired flexibility;Impaired UE functional use;Pain;Increased fascial restricitons   Rehab Potential Good   PT Frequency 2x / week   PT Duration 4 weeks   PT Treatment/Interventions ADLs/Self Care Home Management;Cryotherapy;Electrical Stimulation;Iontophoresis 4mg /ml Dexamethasone;Moist Heat;Functional mobility training;Therapeutic activities;Therapeutic exercise;Patient/family education;Manual techniques;Passive range of motion;Ultrasound   PT Next Visit Plan Follow MD protocol.  Reassess new HEP strengthening exercises, continue to introduce R shoulder/elbow strengthening and functional work related tasks.    PT Home Exercise Plan see above   Consulted and Agree with Plan of Care Patient        Problem List Patient Active Problem List   Diagnosis Date Noted  . Health care maintenance 11/21/2014  . History of colonic polyps 11/21/2014  . Dysphagia 11/19/2014  . Trochanteric bursitis of left hip 10/08/2014  . URI (upper respiratory infection) 02/10/2014  . Pelvic pain in female 12/25/2013  . Personal history of ovarian cyst  12/25/2013  . Change in bowel movement 11/11/2013  . breast tenderness 09/29/2013  . Dysphagia, unspecified(787.20) 06/12/2012  . Barrett's esophagus 06/12/2012  . Environmental allergies 02/13/2012  . Enlarged thyroid 02/13/2012  . GERD (gastroesophageal reflux disease) 02/08/2012  . Leukopenia 02/08/2012  . Fatigue 02/08/2012  . Hypercholesterolemia 02/08/2012   Pura Spice, PT, DPT # [id number]  Hermelinda Dellen, SPT 07/01/2015, 12:42 PM  [facility] [address] [place], [place], [address] Phone: [phone]   Fax:  [phone]  Name: Kelly Massey MRN: [id number] Date of Birth: [birth year]

## 2015-07-03 ENCOUNTER — Ambulatory Visit: Payer: 59 | Admitting: Physical Therapy

## 2015-07-03 DIAGNOSIS — M25621 Stiffness of right elbow, not elsewhere classified: Secondary | ICD-10-CM

## 2015-07-03 DIAGNOSIS — M25511 Pain in right shoulder: Secondary | ICD-10-CM

## 2015-07-03 DIAGNOSIS — M25521 Pain in right elbow: Secondary | ICD-10-CM

## 2015-07-03 DIAGNOSIS — M6281 Muscle weakness (generalized): Secondary | ICD-10-CM

## 2015-07-03 NOTE — Therapy (Signed)
[facility] [address] [place], [place], [address] Phone: [phone]   Fax:  [phone]  Physical Therapy Treatment  Patient Details  Name: Kelly Massey MRN: [id number] Date of Birth: [birth year] Referring Provider: Dr. Percell Miller  Encounter Date: 07/03/2015      PT End of Session - 07/04/15 1715    Visit Number 12   Number of Visits 16   Date for PT Re-Evaluation 07/10/15   PT Start Time 0815   PT Stop Time 0909   PT Time Calculation (min) 54 min   Activity Tolerance Patient tolerated treatment well   Behavior During Therapy [facility] for tasks assessed/performed      Past Medical History  Diagnosis Date  . Hyperlipidemia     ? no meds - diet controlled  . Colon polyps   . HSV infection     History  . GERD (gastroesophageal reflux disease) 10/12/10    EGD, positive H. pylori  . Anemia   . Post-operative nausea and vomiting   . Seasonal allergies   . Arthritis     knees - no meds    Past Surgical History  Procedure Laterality Date  . Shoulder arthroscopy Left   . Dilation and curettage of uterus      SAB  . Abdominal hysterectomy    . Ovarian cyst removal  2004    laparotomy -left  . Esophagogastroduodenoscopy N/A 07/24/2012    Procedure: ESOPHAGOGASTRODUODENOSCOPY (EGD);  Surgeon: Inda Castle, MD;  Location: [facility];  Service: Endoscopy;  Laterality: N/A;  . Bravo ph study N/A 07/24/2012    Procedure: BRAVO [place] STUDY;  Surgeon: Inda Castle, MD;  Location: [facility];  Service: Endoscopy;  Laterality: N/A;  . Balloon dilation N/A 07/24/2012    Procedure: BALLOON DILATION;  Surgeon: Inda Castle, MD;  Location: [facility];  Service: Endoscopy;  Laterality: N/A;  . Wisdom tooth extraction    . Robotic assisted salpingo oopherectomy Left 03/21/2014    Procedure:  ROBOTIC ASSISTED LEFT OOPHORECTOMY;  Surgeon: Marvene Staff, MD;  Location: [facility];  Service: Gynecology;  Laterality: Left;  . Robotic  assisted laparoscopic lysis of adhesion N/A 03/21/2014    Procedure: ROBOTIC ASSISTED LAPAROSCOPIC EXTENSIVE LYSIS OF ADHESION (1 Hour);  Surgeon: Marvene Staff, MD;  Location: [facility];  Service: Gynecology;  Laterality: N/A;  . Tennis elbow release/nirschel procedure Right 05/01/2015    Procedure: RIGHT ELBOW DEBRIDEMENT AND TENDON REPAIR;  Surgeon: [name], MD;  Location: [facility];  Service: Orthopedics;  Laterality: Right;    There were no vitals filed for this visit.  Visit Diagnosis:  Right elbow pain  Right shoulder pain  Joint stiffness of elbow, right  Muscle right arm weakness      Subjective Assessment - 07/04/15 1529    Subjective Pt. states she put down mulch in yard with no increase c/o R elbow discomfort/issues.  Pt. c/o L wrist discomfort with grasping of Nautilus wand.     Pertinent History Pt. out of work for 3 months per MD order   Limitations Lifting;Writing;House hold activities;Other (comment)   Patient Stated Goals Increase R elbow/ UE ROM and strength to promote return to pain-free work/ household tasks.     Currently in Pain? No/denies        OBJECTIVE: Scifit L6.5 10 min. B UE/LE (no pain but LE muscle fatigue reported). There.ex.: Nautilus: standing tricep extension (30#), bicep curls (30#), shoulder IR at  90 deg. (20#), shoulder ER (10#), seated lat. pull downs (40#), scapular retraction rows (30#)/ press-ups (20#)- 30x each (good technique/ no increase c/o pain). Wrist flex./ ext./ radial/ ulnar deviation/ supination/ pronation 20x in seated position with arm supported and 5lb weight.15# box waist to overhead lifting/ bilateral carrying 5x.  Discussed HEP progression with resisted ex. In pain-free range.    Pt requires skilled PT services to increase gross R UE strength, and improve functional mobility. Pt tolerated treatment well today as evidenced by improvements in overhead stability d/t being able to  perform overhead holds with weighted ball without pain.          PT Long Term Goals - 06/12/15 0920    PT LONG TERM GOAL #1   Title Pt will increase R elbow flex PROM to >125 deg. in order to return to improve mobility.   Baseline 149 degrees on 06/12/2015   Time 4   Period Weeks   Status Achieved   PT LONG TERM GOAL #2   Title Pt will improve self-perceived QuickDash score to <50% in order to return to knitting.   Baseline 29% on 06/12/2015   Time 4   Period Weeks   Status Achieved   PT LONG TERM GOAL #3   Title Pt will increase R shoulder abd MMT to 5/5 in order to complete work tasks.   Baseline R shoulder abd: 4+/5 on 2/8   Time 4   Period Weeks   Status On-going   PT LONG TERM GOAL #4   Title Pt will increase R wrist extension AROM to >55 deg. in order to return to knitting.   Baseline 60 degrees on 06/12/2015   Time 4   Period Weeks   Status Achieved   PT LONG TERM GOAL #5   Title Pt will report <2/10 R elbow pain at worst in order to complete household activities with ease.   Baseline 5-6/10 pain 1 time on 06/12/2015   Time 4   Period Weeks   Status On-going   Additional Long Term Goals   Additional Long Term Goals Yes   PT LONG TERM GOAL #6   Title Pt will complete self-percieved QuickDASH questionnaire at <15% disability to improve ability to perform ADLs with R shoulder.    Baseline 29% on 06/12/2015   Time 4   Period Weeks   Status New   PT LONG TERM GOAL #7   Title Pt will demonstrate AROM wrist extension with goniometer at 70 degrees to improve wrist ROM.   Baseline 60 degrees on 06/12/2015   Time 4   Period Weeks   Status New   PT LONG TERM GOAL #8   Title Pt will demonstrate AROM elbow flex with goniometer at 150 degrees to improve pt's ability to perform work tasks.    Baseline 140 degrees on 06/12/2015   Time 4   Period Weeks   Status New            Plan - 07/04/15 1716    Clinical Impression Statement Pt. continues to progress with  R UE resistance training in a pain-free range.  No R elbow tenderness or restrictions with box/ overhead lifting.  L wrist pain with grasping/ press-ups on Nautilus bar.     Pt will benefit from skilled therapeutic intervention in order to improve on the following deficits Decreased mobility;Decreased strength;Decreased scar mobility;Decreased range of motion;Hypomobility;Impaired flexibility;Impaired UE functional use;Pain;Increased fascial restricitons   Rehab Potential Good   PT Frequency 2x /  week   PT Duration 4 weeks   PT Treatment/Interventions ADLs/Self Care Home Management;Cryotherapy;Electrical Stimulation;Iontophoresis 4mg /ml Dexamethasone;Moist Heat;Functional mobility training;Therapeutic activities;Therapeutic exercise;Patient/family education;Manual techniques;Passive range of motion;Ultrasound   PT Next Visit Plan Follow MD protocol.  Reassess new HEP strengthening exercises, continue to introduce R shoulder/elbow strengthening and functional work related tasks.    Consulted and Agree with Plan of Care Patient        Problem List Patient Active Problem List   Diagnosis Date Noted  . Health care maintenance 11/21/2014  . History of colonic polyps 11/21/2014  . Dysphagia 11/19/2014  . Trochanteric bursitis of left hip 10/08/2014  . URI (upper respiratory infection) 02/10/2014  . Pelvic pain in female 12/25/2013  . Personal history of ovarian cyst 12/25/2013  . Change in bowel movement 11/11/2013  . breast tenderness 09/29/2013  . Dysphagia, unspecified(787.20) 06/12/2012  . Barrett's esophagus 06/12/2012  . Environmental allergies 02/13/2012  . Enlarged thyroid 02/13/2012  . GERD (gastroesophageal reflux disease) 02/08/2012  . Leukopenia 02/08/2012  . Fatigue 02/08/2012  . Hypercholesterolemia 02/08/2012   Pura Spice, PT, DPT # [id number]   07/04/2015, 5:19 PM  [facility] [address] [place], [place],  [address] Phone: [phone]   Fax:  [phone]  Name: Kelly Massey MRN: [id number] Date of Birth: December 06, 1965

## 2015-07-04 ENCOUNTER — Encounter: Payer: Self-pay | Admitting: Physical Therapy

## 2015-07-08 ENCOUNTER — Ambulatory Visit: Payer: 59 | Attending: Orthopedic Surgery | Admitting: Physical Therapy

## 2015-07-08 DIAGNOSIS — M6281 Muscle weakness (generalized): Secondary | ICD-10-CM | POA: Diagnosis present

## 2015-07-08 DIAGNOSIS — M25521 Pain in right elbow: Secondary | ICD-10-CM | POA: Diagnosis present

## 2015-07-08 DIAGNOSIS — M25511 Pain in right shoulder: Secondary | ICD-10-CM | POA: Insufficient documentation

## 2015-07-08 DIAGNOSIS — M25621 Stiffness of right elbow, not elsewhere classified: Secondary | ICD-10-CM | POA: Insufficient documentation

## 2015-07-08 NOTE — Therapy (Signed)
[facility] [address] [place], [place], [address] Phone: [phone]   Fax:  [phone]  Physical Therapy Treatment  Patient Details  Name: Kelly Massey MRN: [id number] Date of Birth: 1965/06/05 Referring Provider: Dr. Percell Miller  Encounter Date: 07/08/2015      PT End of Session - 07/08/15 1133    Visit Number 13   Number of Visits 20   Date for PT Re-Evaluation 08/05/15   PT Start Time [id number]   PT Stop Time 0921   PT Time Calculation (min) 57 min   Activity Tolerance Patient tolerated treatment well;No increased pain   Behavior During Therapy [facility] for tasks assessed/performed      Past Medical History  Diagnosis Date  . Hyperlipidemia     ? no meds - diet controlled  . Colon polyps   . HSV infection     History  . GERD (gastroesophageal reflux disease) 10/12/10    EGD, positive H. pylori  . Anemia   . Post-operative nausea and vomiting   . Seasonal allergies   . Arthritis     knees - no meds    Past Surgical History  Procedure Laterality Date  . Shoulder arthroscopy Left   . Dilation and curettage of uterus      SAB  . Abdominal hysterectomy    . Ovarian cyst removal  2004    laparotomy -left  . Esophagogastroduodenoscopy N/A 07/24/2012    Procedure: ESOPHAGOGASTRODUODENOSCOPY (EGD);  Surgeon: Inda Castle, MD;  Location: [facility];  Service: Endoscopy;  Laterality: N/A;  . Bravo ph study N/A 07/24/2012    Procedure: BRAVO [place] STUDY;  Surgeon: Inda Castle, MD;  Location: [facility];  Service: Endoscopy;  Laterality: N/A;  . Balloon dilation N/A 07/24/2012    Procedure: BALLOON DILATION;  Surgeon: Inda Castle, MD;  Location: [facility];  Service: Endoscopy;  Laterality: N/A;  . Wisdom tooth extraction    . Robotic assisted salpingo oopherectomy Left 03/21/2014    Procedure:  ROBOTIC ASSISTED LEFT OOPHORECTOMY;  Surgeon: Marvene Staff, MD;  Location: [facility];  Service: Gynecology;  Laterality:  Left;  . Robotic assisted laparoscopic lysis of adhesion N/A 03/21/2014    Procedure: ROBOTIC ASSISTED LAPAROSCOPIC EXTENSIVE LYSIS OF ADHESION (1 Hour);  Surgeon: Marvene Staff, MD;  Location: [facility];  Service: Gynecology;  Laterality: N/A;  . Tennis elbow release/nirschel procedure Right 05/01/2015    Procedure: RIGHT ELBOW DEBRIDEMENT AND TENDON REPAIR;  Surgeon: [name], MD;  Location: [facility];  Service: Orthopedics;  Laterality: Right;    There were no vitals filed for this visit.  Visit Diagnosis:  Right elbow pain  Right shoulder pain  Joint stiffness of elbow, right  Muscle right arm weakness      Subjective Assessment - 07/08/15 0825    Subjective Pt reports that she is well today with not much pain.  Pt c/o pain at a 1-2/10.    Pertinent History Pt. out of work for 3 months per MD order   Limitations Lifting;Writing;House hold activities;Other (comment)   Patient Stated Goals Increase R elbow/ UE ROM and strength to promote return to pain-free work/ household tasks.     Currently in Pain? Yes   Pain Score 2    Pain Location Elbow   Pain Orientation Right   Pain Type Surgical pain   Pain Onset 1 to 4 weeks ago   Pain Frequency Constant  OBJECTIVE: Ther Ex: Performed 2x15-25 trampoline shoulder IR throws and overhead with small medicine ball, required v.c. With IR to keep arm out at 90 degrees abd.  Performed 1x20 wall push-ups.  Therapeutic Activity: Performed 2x3x20-30 (1 set each exercise) of work simulated tasks for shoveling and overhead tossing at 20lbs of weight on Nautilus, required v.c. In terms of body mechanics when performing lifting motions.  10 minutes SCIFIT at level 5 resistance.    Pt requires skilled PT services to increase gross R UE strength, and improve performance with functional work tasks.  Pt tolerated treatment well today as evidenced by performing work simulated tasks without increased pain.        PT  Education - 07/08/15 1132    Education provided Yes   Education Details Educated on Designer, fashion/clothing with simulated work tasks.    Person(s) Educated Patient   Methods Explanation;Demonstration;Verbal cues   Comprehension Verbalized understanding;Returned demonstration;Verbal cues required             PT Long Term Goals - 07/08/15 1146    PT LONG TERM GOAL #1   Title Pt will increase R elbow flex PROM to >125 deg. in order to return to improve mobility.   Time 4   Period Weeks   Status Achieved   PT LONG TERM GOAL #2   Title Pt will improve self-perceived QuickDash score to <50% in order to return to knitting.   Baseline QuickDASH 11.4%  07/08/15   Time 4   Period Weeks   Status Achieved   PT LONG TERM GOAL #3   Title Pt will increase R shoulder abd MMT to 5/5 in order to complete work tasks.   Baseline R shoulder abd 4/5 on 07/08/2015.  This measurement was taken at treatment conclusion with pt in a state of muscular fatigue.    Time 4   Period Weeks   Status On-going   PT LONG TERM GOAL #4   Title Pt will increase R wrist extension AROM to >55 deg. in order to return to knitting.   Baseline 75 deg.  07/08/15   Time 4   Period Weeks   Status Achieved   PT LONG TERM GOAL #5   Title Pt will report <2/10 R elbow pain at worst in order to complete household activities with ease.   Baseline 2-3/10 pain on 07/08/2015   Time 4   Period Weeks   Status On-going   Additional Long Term Goals   Additional Long Term Goals Yes   PT LONG TERM GOAL #6   Title Pt will complete self-percieved QuickDASH questionnaire at <15% disability to improve ability to perform ADLs with R shoulder.    Baseline 11.4% on 07/08/2015   Time 4   Period Weeks   Status Achieved   PT LONG TERM GOAL #7   Title Pt will demonstrate AROM wrist extension with goniometer at 70 degrees to improve wrist ROM.   Baseline 75 degrees on 07/08/2015   Time 4   Status Achieved   PT LONG TERM GOAL #8   Title Pt  will demonstrate AROM elbow flex with goniometer at 150 degrees to improve pt's ability to perform work tasks.    Baseline 146 degrees on 07/08/2015   Time 4   Period Weeks   Status On-going   PT LONG TERM GOAL  #9   TITLE Pt. able to return to work with no work-related limitations/ no R elbow pain.     Time 4  Period Weeks   Status New               Plan - 07/08/15 1141    Clinical Impression Statement Pt. presents with improved progress when performing strengthening exercises as evidenced by tolerating new progressive strengthening exercises without pain.  Pt demonstrates improved AROM R elbow flexion (146 degrees) and wrist extension (75 degrees).  Pt presents with improved shoulder strength (R shoulder flex 4+/5,  abd. 4/5 MMT were taking at treatment conclusion).  Pt. has improved self-percieved limtations as evidenced by improved QuickDASH score of 11.4% impairment.    Pt will benefit from skilled therapeutic intervention in order to improve on the following deficits Decreased mobility;Decreased strength;Decreased scar mobility;Decreased range of motion;Hypomobility;Impaired flexibility;Impaired UE functional use;Pain;Increased fascial restricitons   Rehab Potential Good   PT Frequency 2x / week   PT Duration 4 weeks   PT Treatment/Interventions ADLs/Self Care Home Management;Cryotherapy;Electrical Stimulation;Iontophoresis 4mg /ml Dexamethasone;Moist Heat;Functional mobility training;Therapeutic activities;Therapeutic exercise;Patient/family education;Manual techniques;Passive range of motion;Ultrasound   PT Next Visit Plan Follow MD protocol.  Continue with strengthening exercises and functional work tasks.    PT Home Exercise Plan see above   Consulted and Agree with Plan of Care Patient        Problem List Patient Active Problem List   Diagnosis Date Noted  . Health care maintenance 11/21/2014  . History of colonic polyps 11/21/2014  . Dysphagia 11/19/2014  .  Trochanteric bursitis of left hip 10/08/2014  . URI (upper respiratory infection) 02/10/2014  . Pelvic pain in female 12/25/2013  . Personal history of ovarian cyst 12/25/2013  . Change in bowel movement 11/11/2013  . breast tenderness 09/29/2013  . Dysphagia, unspecified(787.20) 06/12/2012  . Barrett's esophagus 06/12/2012  . Environmental allergies 02/13/2012  . Enlarged thyroid 02/13/2012  . GERD (gastroesophageal reflux disease) 02/08/2012  . Leukopenia 02/08/2012  . Fatigue 02/08/2012  . Hypercholesterolemia 02/08/2012   Pura Spice, PT, DPT # [id number]   07/09/2015, 8:39 AM  [facility] [address] [place], [place], [address] Phone: [phone]   Fax:  [phone]  Name: Kelly Massey MRN: [id number] Date of Birth: November 29, 1965

## 2015-07-10 ENCOUNTER — Encounter: Payer: 59 | Admitting: Physical Therapy

## 2015-07-15 ENCOUNTER — Ambulatory Visit: Payer: 59

## 2015-07-15 DIAGNOSIS — M25511 Pain in right shoulder: Secondary | ICD-10-CM

## 2015-07-15 DIAGNOSIS — M25621 Stiffness of right elbow, not elsewhere classified: Secondary | ICD-10-CM

## 2015-07-15 DIAGNOSIS — M6281 Muscle weakness (generalized): Secondary | ICD-10-CM

## 2015-07-15 DIAGNOSIS — M25521 Pain in right elbow: Secondary | ICD-10-CM | POA: Diagnosis not present

## 2015-07-15 NOTE — Therapy (Signed)
[facility] [address] [place], [place], [address] Phone: [phone]   Fax:  [phone]  Physical Therapy Treatment  Patient Details  Name: Kelly Massey MRN: [id number] Date of Birth: 1965-12-13 Referring Provider: Dr. Percell Miller  Encounter Date: 07/15/2015      PT End of Session - 07/15/15 1218    Visit Number 14   Number of Visits 20   Date for PT Re-Evaluation 08/05/15   PT Start Time 0816   PT Stop Time 0902   PT Time Calculation (min) 46 min   Activity Tolerance Patient tolerated treatment well;No increased pain      Past Medical History  Diagnosis Date  . Hyperlipidemia     ? no meds - diet controlled  . Colon polyps   . HSV infection     History  . GERD (gastroesophageal reflux disease) 10/12/10    EGD, positive H. pylori  . Anemia   . Post-operative nausea and vomiting   . Seasonal allergies   . Arthritis     knees - no meds    Past Surgical History  Procedure Laterality Date  . Shoulder arthroscopy Left   . Dilation and curettage of uterus      SAB  . Abdominal hysterectomy    . Ovarian cyst removal  2004    laparotomy -left  . Esophagogastroduodenoscopy N/A 07/24/2012    Procedure: ESOPHAGOGASTRODUODENOSCOPY (EGD);  Surgeon: Inda Castle, MD;  Location: [facility];  Service: Endoscopy;  Laterality: N/A;  . Bravo ph study N/A 07/24/2012    Procedure: BRAVO [place] STUDY;  Surgeon: Inda Castle, MD;  Location: [facility];  Service: Endoscopy;  Laterality: N/A;  . Balloon dilation N/A 07/24/2012    Procedure: BALLOON DILATION;  Surgeon: Inda Castle, MD;  Location: [facility];  Service: Endoscopy;  Laterality: N/A;  . Wisdom tooth extraction    . Robotic assisted salpingo oopherectomy Left 03/21/2014    Procedure:  ROBOTIC ASSISTED LEFT OOPHORECTOMY;  Surgeon: Marvene Staff, MD;  Location: [facility];  Service: Gynecology;  Laterality: Left;  . Robotic assisted laparoscopic lysis of adhesion  N/A 03/21/2014    Procedure: ROBOTIC ASSISTED LAPAROSCOPIC EXTENSIVE LYSIS OF ADHESION (1 Hour);  Surgeon: Marvene Staff, MD;  Location: [facility];  Service: Gynecology;  Laterality: N/A;  . Tennis elbow release/nirschel procedure Right 05/01/2015    Procedure: RIGHT ELBOW DEBRIDEMENT AND TENDON REPAIR;  Surgeon: [name], MD;  Location: [facility];  Service: Orthopedics;  Laterality: Right;    There were no vitals filed for this visit.      Subjective Assessment - 07/15/15 1118    Subjective Pt reports that she is well today, rates pain at a 2/10.  Pt states that she had to cancel her previous therapy session d/t muscular knots in her L UE.    Pertinent History Pt. out of work for 3 months per MD order   Limitations Lifting;Writing;House hold activities;Other (comment)   Patient Stated Goals Increase R elbow/ UE ROM and strength to promote return to pain-free work/ household tasks.     Currently in Pain? Yes   Pain Score 2    Pain Location Elbow   Pain Orientation Right   Pain Type Surgical pain   Pain Onset More than a month ago   Pain Frequency Constant     OBJECTIVE: TherEx: 2x30-45 sec overhead setting with green therapy ball, 3x30 second pectoral doorway stretch,  2x15-25 rows  with and without red theraband, 10 minutes on SCIFIT level 6 (5 min FWD/35min BKWD).  Therapeutic Activity: Nautilus all at 30lbs 2x20-30: overhead pulls across body, squat lift to overhead shovel, and truck rotation across body.     Pt requires skilled PT services to increase ability complete work related tasks, increase R shoulder/elbow stability, and decrease pain.  Pt tolerated therapy well today as evidenced by improved toleranne of work simulated tasks of nautilus without pain.         PT Education - 07/15/15 1120    Education provided Yes   Education Details Educated on new postural exercises to place within pt's HEP.    Person(s) Educated Patient   Methods  Explanation;Demonstration;Tactile cues;Verbal cues;Handout   Comprehension Verbalized understanding;Returned demonstration;Tactile cues required;Verbal cues required             PT Long Term Goals - 07/08/15 1146    PT LONG TERM GOAL #1   Title Pt will increase R elbow flex PROM to >125 deg. in order to return to improve mobility.   Time 4   Period Weeks   Status Achieved   PT LONG TERM GOAL #2   Title Pt will improve self-perceived QuickDash score to <50% in order to return to knitting.   Baseline QuickDASH 11.4%  07/08/15   Time 4   Period Weeks   Status Achieved   PT LONG TERM GOAL #3   Title Pt will increase R shoulder abd MMT to 5/5 in order to complete work tasks.   Baseline R shoulder abd 4/5 on 07/08/2015.  This measurement was taken at treatment conclusion with pt in a state of muscular fatigue.    Time 4   Period Weeks   Status On-going   PT LONG TERM GOAL #4   Title Pt will increase R wrist extension AROM to >55 deg. in order to return to knitting.   Baseline 75 deg.  07/08/15   Time 4   Period Weeks   Status Achieved   PT LONG TERM GOAL #5   Title Pt will report <2/10 R elbow pain at worst in order to complete household activities with ease.   Baseline 2-3/10 pain on 07/08/2015   Time 4   Period Weeks   Status On-going   Additional Long Term Goals   Additional Long Term Goals Yes   PT LONG TERM GOAL #6   Title Pt will complete self-percieved QuickDASH questionnaire at <15% disability to improve ability to perform ADLs with R shoulder.    Baseline 11.4% on 07/08/2015   Time 4   Period Weeks   Status Achieved   PT LONG TERM GOAL #7   Title Pt will demonstrate AROM wrist extension with goniometer at 70 degrees to improve wrist ROM.   Baseline 75 degrees on 07/08/2015   Time 4   Status Achieved   PT LONG TERM GOAL #8   Title Pt will demonstrate AROM elbow flex with goniometer at 150 degrees to improve pt's ability to perform work tasks.    Baseline 146  degrees on 07/08/2015   Time 4   Period Weeks   Status On-going   PT LONG TERM GOAL  #9   TITLE Pt. able to return to work with no work-related limitations/ no R elbow pain.     Time 4   Period Weeks   Status New               Plan - 07/15/15 1220  Clinical Impression Statement Pt presents with good tolerance of work simulated tasks, but still requires work toward increasing strength.  Pt is able to complete all treatment exercises without any increases in pain.  Pt stated that she had posterior shoulder pain in the evenings, and demonstrates a forward shoulder posture.  Pt was given rows with a red theraband and pectoral doorway stretch to help with posture.     Rehab Potential Good   PT Frequency 2x / week   PT Duration 4 weeks   PT Treatment/Interventions ADLs/Self Care Home Management;Cryotherapy;Electrical Stimulation;Iontophoresis 4mg /ml Dexamethasone;Moist Heat;Functional mobility training;Therapeutic activities;Therapeutic exercise;Patient/family education;Manual techniques;Passive range of motion;Ultrasound   PT Next Visit Plan Follow MD protocol.  Continue with current POC.    PT Home Exercise Plan see above   Consulted and Agree with Plan of Care Patient      Patient will benefit from skilled therapeutic intervention in order to improve the following deficits and impairments:  Decreased mobility, Decreased strength, Decreased scar mobility, Decreased range of motion, Hypomobility, Impaired flexibility, Impaired UE functional use, Pain, Increased fascial restricitons  Visit Diagnosis: Muscle weakness (generalized)  Right elbow pain  Right shoulder pain  Joint stiffness of elbow, right     Problem List Patient Active Problem List   Diagnosis Date Noted  . Health care maintenance 11/21/2014  . History of colonic polyps 11/21/2014  . Dysphagia 11/19/2014  . Trochanteric bursitis of left hip 10/08/2014  . URI (upper respiratory infection) 02/10/2014  .  Pelvic pain in female 12/25/2013  . Personal history of ovarian cyst 12/25/2013  . Change in bowel movement 11/11/2013  . breast tenderness 09/29/2013  . Dysphagia, unspecified(787.20) 06/12/2012  . Barrett's esophagus 06/12/2012  . Environmental allergies 02/13/2012  . Enlarged thyroid 02/13/2012  . GERD (gastroesophageal reflux disease) 02/08/2012  . Leukopenia 02/08/2012  . Fatigue 02/08/2012  . Hypercholesterolemia 02/08/2012   This entire session was performed under direct supervision and direction of a licensed therapist/therapist assistant . I have personally read, edited and approve of the note as written.   Phillips Grout PT, DPT   Hermelinda Dellen, SPT 07/15/2015, 12:58 PM  [facility] [address] [place], [place], [address] Phone: [phone]   Fax:  [phone]  Name: Kelly Massey MRN: [id number] Date of Birth: 27-Feb-1966

## 2015-07-17 ENCOUNTER — Encounter: Payer: Self-pay | Admitting: Physical Therapy

## 2015-07-22 ENCOUNTER — Encounter: Payer: Self-pay | Admitting: Physical Therapy

## 2015-07-22 ENCOUNTER — Ambulatory Visit: Payer: 59 | Admitting: Physical Therapy

## 2015-07-22 DIAGNOSIS — M6281 Muscle weakness (generalized): Secondary | ICD-10-CM

## 2015-07-22 DIAGNOSIS — M25521 Pain in right elbow: Secondary | ICD-10-CM | POA: Diagnosis not present

## 2015-07-22 DIAGNOSIS — M25511 Pain in right shoulder: Secondary | ICD-10-CM

## 2015-07-22 DIAGNOSIS — M25621 Stiffness of right elbow, not elsewhere classified: Secondary | ICD-10-CM

## 2015-07-22 NOTE — Therapy (Signed)
[facility] [address] [place], [place], [address] Phone: [phone]   Fax:  [phone]  Physical Therapy Treatment  Patient Details  Name: Kelly Massey MRN: [id number] Date of Birth: 12/26/1965 Referring Provider: Dr. Percell Miller  Encounter Date: 07/22/2015      PT End of Session - 07/22/15 1246    Visit Number 15   Number of Visits 20   Date for PT Re-Evaluation 08/05/15   PT Start Time 0832   PT Stop Time 0920   PT Time Calculation (min) 48 min   Activity Tolerance Patient tolerated treatment well;No increased pain   Behavior During Therapy [facility] for tasks assessed/performed      Past Medical History  Diagnosis Date  . Hyperlipidemia     ? no meds - diet controlled  . Colon polyps   . HSV infection     History  . GERD (gastroesophageal reflux disease) 10/12/10    EGD, positive H. pylori  . Anemia   . Post-operative nausea and vomiting   . Seasonal allergies   . Arthritis     knees - no meds    Past Surgical History  Procedure Laterality Date  . Shoulder arthroscopy Left   . Dilation and curettage of uterus      SAB  . Abdominal hysterectomy    . Ovarian cyst removal  2004    laparotomy -left  . Esophagogastroduodenoscopy N/A 07/24/2012    Procedure: ESOPHAGOGASTRODUODENOSCOPY (EGD);  Surgeon: Inda Castle, MD;  Location: [facility];  Service: Endoscopy;  Laterality: N/A;  . Bravo ph study N/A 07/24/2012    Procedure: BRAVO [place] STUDY;  Surgeon: Inda Castle, MD;  Location: [facility];  Service: Endoscopy;  Laterality: N/A;  . Balloon dilation N/A 07/24/2012    Procedure: BALLOON DILATION;  Surgeon: Inda Castle, MD;  Location: [facility];  Service: Endoscopy;  Laterality: N/A;  . Wisdom tooth extraction    . Robotic assisted salpingo oopherectomy Left 03/21/2014    Procedure:  ROBOTIC ASSISTED LEFT OOPHORECTOMY;  Surgeon: Marvene Staff, MD;  Location: [facility];  Service: Gynecology;  Laterality:  Left;  . Robotic assisted laparoscopic lysis of adhesion N/A 03/21/2014    Procedure: ROBOTIC ASSISTED LAPAROSCOPIC EXTENSIVE LYSIS OF ADHESION (1 Hour);  Surgeon: Marvene Staff, MD;  Location: [facility];  Service: Gynecology;  Laterality: N/A;  . Tennis elbow release/nirschel procedure Right 05/01/2015    Procedure: RIGHT ELBOW DEBRIDEMENT AND TENDON REPAIR;  Surgeon: [name], MD;  Location: [facility];  Service: Orthopedics;  Laterality: Right;    There were no vitals filed for this visit.      Subjective Assessment - 07/22/15 0833    Subjective Pt reports being well today, with no c/o pain.  Pt states that her shoulder was sore after performing work tasks on nautilus last session.    Pertinent History Pt. out of work for 3 months per MD order   Limitations Lifting;Writing;House hold activities;Other (comment)   Patient Stated Goals Increase R elbow/ UE ROM and strength to promote return to pain-free work/ household tasks.     Currently in Pain? No/denies     OBJECTIVE: Manual therapy: 14 minutes soft tissue massage performed to pt's R upper arm at trigger points anterior with Free-Up lotion and pt position in seated position and R UE on SPT knee in slight abd.  Therapeutic activity: 2x20-30 (1 round each direction) Nautilus wood chopping at 30lbs weight, with  v.c. To contract her core and complete a full chopping motion.  Discussed work-related tasks to promote safe return to work.  TherEx: 2x10 kneeling blue ball pushups with v.c. To keep her hips down.  30x squat to a chair with raised surface of 2 airex pads and followed with overhead raise of medium weighted ball.  3x 30 second doorway pectoral stretch. 10 minutes SCIFIT at level 7 resistance.    Pt requires skilled PT services to improve functional strength for RTW and completion of ADLs.  Pt tolerated rx well today as evidenced by completing all strengthening exercises without any c/o increased pain.         PT Education - 07/22/15 1246    Education provided Yes   Education Details Pt educated on importance of stretching to decrease muscular tension that she has in her R upper arm.   Person(s) Educated Patient   Methods Explanation   Comprehension Verbalized understanding             PT Long Term Goals - 07/22/15 0834    PT LONG TERM GOAL #1   Title Pt will increase R elbow flex PROM to >125 deg. in order to return to improve mobility.   Baseline 149 degrees on 06/12/2015   Time 4   Period Weeks   Status Achieved   PT LONG TERM GOAL #2   Title Pt will improve self-perceived QuickDash score to <50% in order to return to knitting.   Baseline QuickDASH 11.4%  07/08/15   Time 4   Period Weeks   Status Achieved   PT LONG TERM GOAL #3   Title Pt will increase R shoulder abd MMT to 5/5 in order to complete work tasks.   Baseline R shoulder abd 4+/5 on 07/08/2015.     Time 4   Period Weeks   Status Partially Met   PT LONG TERM GOAL #4   Title Pt will increase R wrist extension AROM to >55 deg. in order to return to knitting.   Baseline 75 deg.  07/08/15   Time 4   Period Weeks   Status Achieved   PT LONG TERM GOAL #5   Title Pt will report <2/10 R elbow pain at worst in order to complete household activities with ease.   Baseline No c/o elbow pain on 07/21/2015   Time 4   Period Weeks   Status Achieved   Additional Long Term Goals   Additional Long Term Goals Yes   PT LONG TERM GOAL #6   Title Pt will complete self-percieved QuickDASH questionnaire at <15% disability to improve ability to perform ADLs with R shoulder.    Baseline 11.4% on 07/08/2015   Time 4   Period Weeks   Status Achieved   PT LONG TERM GOAL #7   Title Pt will demonstrate AROM wrist extension with goniometer at 70 degrees to improve wrist ROM.   Baseline 75 degrees on 07/08/2015   Time 4   Period Weeks   Status Achieved   PT LONG TERM GOAL #8   Title Pt will demonstrate AROM elbow flex with goniometer  at 150 degrees to improve pt's ability to perform work tasks.    Baseline 146 degrees on 07/08/2015   Time 4   Period Weeks   Status On-going   PT LONG TERM GOAL  #9   TITLE Pt. able to return to work with no work-related limitations/ no R elbow pain.     Time 4  Period Weeks   Status Unable to assess   PT LONG TERM GOAL  #10   TITLE Pt will complete self-percieved QuickDASH questionnaire at <10% disability to improve ability to perform ADLs with R shoulder.    Baseline 11.4% on 07/08/2015   Time 4   Period Weeks   Status New               Plan - 07/22/15 1248    Clinical Impression Statement Pt presents with improved R shoulder strength (flex 5/5, abd 4+/5) but c/o pain with MMT d/t moderate to severe trigger points within her R upper arm.  Pt presents with increased strength as evidenced by completing functional work tasks, kneeling ball push-ups, and squats with overhead lift using weighted ball and no c/o increased pain.   Full R elbow AROM flexion/ ext. and abd. to grasp weighted wand with no increaes c/o elbow pain.  Pt. hoping to return to work next week with no R elbow limitations/ pain.    Rehab Potential Good   PT Frequency 2x / week   PT Duration 4 weeks   PT Treatment/Interventions ADLs/Self Care Home Management;Cryotherapy;Electrical Stimulation;Iontophoresis 2m/ml Dexamethasone;Moist Heat;Functional mobility training;Therapeutic activities;Therapeutic exercise;Patient/family education;Manual techniques;Passive range of motion;Ultrasound   PT Next Visit Plan Follow MD protocol.  Discuss with pt how physician appointment went.  Continue with functional and strengthening exercises.  1 more tx. session   PT Home Exercise Plan see above   Consulted and Agree with Plan of Care Patient      Patient will benefit from skilled therapeutic intervention in order to improve the following deficits and impairments:  Decreased mobility, Decreased strength, Decreased scar  mobility, Decreased range of motion, Hypomobility, Impaired flexibility, Impaired UE functional use, Pain, Increased fascial restricitons  Visit Diagnosis: Muscle weakness (generalized)  Joint stiffness of elbow, right  Right shoulder pain     Problem List Patient Active Problem List   Diagnosis Date Noted  . Health care maintenance 11/21/2014  . History of colonic polyps 11/21/2014  . Dysphagia 11/19/2014  . Trochanteric bursitis of left hip 10/08/2014  . URI (upper respiratory infection) 02/10/2014  . Pelvic pain in female 12/25/2013  . Personal history of ovarian cyst 12/25/2013  . Change in bowel movement 11/11/2013  . breast tenderness 09/29/2013  . Dysphagia, unspecified(787.20) 06/12/2012  . Barrett's esophagus 06/12/2012  . Environmental allergies 02/13/2012  . Enlarged thyroid 02/13/2012  . GERD (gastroesophageal reflux disease) 02/08/2012  . Leukopenia 02/08/2012  . Fatigue 02/08/2012  . Hypercholesterolemia 02/08/2012   MPura Spice PT, DPT # [id number]  07/23/2015, 10:09 AM  [facility] [facility]MCenterville [place] [address]Phone: [phone]  Fax:  [phone] Name: Kelly WINNEMRN: [id number] of Birth: [birth year]

## 2015-07-23 ENCOUNTER — Encounter: Payer: 59 | Admitting: Physical Therapy

## 2015-07-29 ENCOUNTER — Encounter: Payer: 59 | Admitting: Physical Therapy

## 2015-08-05 ENCOUNTER — Encounter: Payer: Self-pay | Admitting: Physical Therapy

## 2015-08-05 ENCOUNTER — Ambulatory Visit: Payer: 59 | Attending: Orthopedic Surgery | Admitting: Physical Therapy

## 2015-08-05 DIAGNOSIS — M25621 Stiffness of right elbow, not elsewhere classified: Secondary | ICD-10-CM | POA: Diagnosis present

## 2015-08-05 DIAGNOSIS — M25521 Pain in right elbow: Secondary | ICD-10-CM | POA: Diagnosis present

## 2015-08-05 DIAGNOSIS — M25511 Pain in right shoulder: Secondary | ICD-10-CM | POA: Diagnosis present

## 2015-08-05 DIAGNOSIS — M6281 Muscle weakness (generalized): Secondary | ICD-10-CM | POA: Insufficient documentation

## 2015-08-05 NOTE — Therapy (Signed)
[facility] [address] [place], [place], [address] Phone: [phone]   Fax:  [phone]  Physical Therapy Treatment  Patient Details  Name: Kelly Massey MRN: [id number] Date of Birth: 07-10-1965 Referring Provider: Dr. Percell Miller  Encounter Date: 08/05/2015      PT End of Session - 08/05/15 0827    Visit Number 16   Number of Visits 20   Date for PT Re-Evaluation 08/05/15   PT Start Time 0818   PT Stop Time 0905   PT Time Calculation (min) 47 min   Activity Tolerance Patient tolerated treatment well;No increased pain   Behavior During Therapy [facility] for tasks assessed/performed      Past Medical History  Diagnosis Date  . Hyperlipidemia     ? no meds - diet controlled  . Colon polyps   . HSV infection     History  . GERD (gastroesophageal reflux disease) 10/12/10    EGD, positive H. pylori  . Anemia   . Post-operative nausea and vomiting   . Seasonal allergies   . Arthritis     knees - no meds    Past Surgical History  Procedure Laterality Date  . Shoulder arthroscopy Left   . Dilation and curettage of uterus      SAB  . Abdominal hysterectomy    . Ovarian cyst removal  2004    laparotomy -left  . Esophagogastroduodenoscopy N/A 07/24/2012    Procedure: ESOPHAGOGASTRODUODENOSCOPY (EGD);  Surgeon: Inda Castle, MD;  Location: [facility];  Service: Endoscopy;  Laterality: N/A;  . Bravo ph study N/A 07/24/2012    Procedure: BRAVO [place] STUDY;  Surgeon: Inda Castle, MD;  Location: [facility];  Service: Endoscopy;  Laterality: N/A;  . Balloon dilation N/A 07/24/2012    Procedure: BALLOON DILATION;  Surgeon: Inda Castle, MD;  Location: [facility];  Service: Endoscopy;  Laterality: N/A;  . Wisdom tooth extraction    . Robotic assisted salpingo oopherectomy Left 03/21/2014    Procedure:  ROBOTIC ASSISTED LEFT OOPHORECTOMY;  Surgeon: Marvene Staff, MD;  Location: [facility];  Service: Gynecology;  Laterality:  Left;  . Robotic assisted laparoscopic lysis of adhesion N/A 03/21/2014    Procedure: ROBOTIC ASSISTED LAPAROSCOPIC EXTENSIVE LYSIS OF ADHESION (1 Hour);  Surgeon: Marvene Staff, MD;  Location: [facility];  Service: Gynecology;  Laterality: N/A;  . Tennis elbow release/nirschel procedure Right 05/01/2015    Procedure: RIGHT ELBOW DEBRIDEMENT AND TENDON REPAIR;  Surgeon: [name], MD;  Location: [facility];  Service: Orthopedics;  Laterality: Right;    There were no vitals filed for this visit.      Subjective Assessment - 08/05/15 0823    Subjective Pt. states she has no pain in R elbow but shoulder is stiff this morning.  Pt. planning on returning to work next week.      Limitations Lifting;Writing;House hold activities;Other (comment)   Patient Stated Goals Increase R elbow/ UE ROM and strength to promote return to pain-free work/ household tasks.     Currently in Pain? No/denies         OBJECTIVE: Manual therapy:  10 minutes SCIFIT at level 7 resistance. Nautilus: 40# lat. Pull downs/ 20# tricep ext./ 30# sh. Adduction/ 40# scap. Retraction with one-hand 30x each.  5# bicep curls/ press-ups 10x2 (no pain). Seated grip strength: R 40.1#, L 37.4#.  R shoulder/elbow/wrist manual resisted isometrics 10x with holds (moderate/ max resistance)- no pain.  Reviewed  HEP in depth (handouts).  Discharge from PT at this time.    Pt requires skilled PT services to improve functional strength for RTW and completion of ADLs. Pt tolerated rx well today as evidenced by completing all strengthening exercises without any c/o increased pain.  Pt. Instructed to contact PT if any issues with return to work.            PT Long Term Goals - 08/05/15 0856    PT LONG TERM GOAL #1   Title Pt will increase R elbow flex PROM to >125 deg. in order to return to improve mobility.   Baseline 149 degrees on 06/12/2015   Time 4   Period Weeks   Status Achieved   PT LONG TERM GOAL #2    Title Pt will improve self-perceived QuickDash score to <50% in order to return to knitting.   Baseline QuickDASH 13%  08/05/15   Time 4   Period Weeks   Status Achieved   PT LONG TERM GOAL #3   Title Pt will increase R shoulder abd MMT to 5/5 in order to complete work tasks.   Baseline R sh. strength grossly 5/5 MMT   Time 4   Period Weeks   Status Achieved   PT LONG TERM GOAL #4   Title Pt will increase R wrist extension AROM to >55 deg. in order to return to knitting.   Baseline 75 deg.  07/08/15   Time 4   Period Weeks   Status Achieved   PT LONG TERM GOAL #5   Title Pt will report <2/10 R elbow pain at worst in order to complete household activities with ease.   Baseline No c/o elbow pain on 07/21/2015   Time 4   Period Weeks   Status Achieved   PT LONG TERM GOAL #6   Title Pt will complete self-percieved QuickDASH questionnaire at <15% disability to improve ability to perform ADLs with R shoulder.    Baseline 13% on 5/2   Time 4   Period Weeks   Status Achieved   PT LONG TERM GOAL #7   Title Pt will demonstrate AROM wrist extension with goniometer at 70 degrees to improve wrist ROM.   Baseline 75 degrees on 07/08/2015   Time 4   Period Weeks   Status Achieved   PT LONG TERM GOAL #8   Title Pt will demonstrate AROM elbow flex with goniometer at 150 degrees to improve pt's ability to perform work tasks.    Baseline 148 deg. on 5/2   Time 4   Period Weeks   Status Achieved   PT LONG TERM GOAL  #9   TITLE Pt. able to return to work with no work-related limitations/ no R elbow pain.     Time 4   Period Weeks   Status Unable to assess   PT LONG TERM GOAL  #10   TITLE Pt will complete self-percieved QuickDASH questionnaire at <10% disability to improve ability to perform ADLs with R shoulder.    Baseline 13% on 5/2   Time 4   Period Weeks   Status Partially Met               Plan - 08/06/15 0720    Clinical Impression Statement Pt. has shown consistent  progress with PT over past month and is ready to return to work without limitations.  Pt. has no R elbow pain or limitations with grasping/ job related tasks.  QuickDASH: 13%.  R  shoulder strength grossly 5/5 MMT except abd. 4+/5 MMT.  Full R elbow AROM as compared to L elbow.  Pt. instructed to continue with consistent HEP and contact PT if any issues after returning to work.     Rehab Potential Good   PT Frequency 2x / week   PT Duration 4 weeks   PT Treatment/Interventions ADLs/Self Care Home Management;Cryotherapy;Electrical Stimulation;Iontophoresis 8m/ml Dexamethasone;Moist Heat;Functional mobility training;Therapeutic activities;Therapeutic exercise;Patient/family education;Manual techniques;Passive range of motion;Ultrasound   PT Next Visit Plan Discharge      Patient will benefit from skilled therapeutic intervention in order to improve the following deficits and impairments:  Decreased mobility, Decreased strength, Decreased scar mobility, Decreased range of motion, Hypomobility, Impaired flexibility, Impaired UE functional use, Pain, Increased fascial restricitons  Visit Diagnosis: Muscle weakness (generalized)  Joint stiffness of elbow, right  Right shoulder pain  Right elbow pain  Muscle right arm weakness     Problem List Patient Active Problem List   Diagnosis Date Noted  . Health care maintenance 11/21/2014  . History of colonic polyps 11/21/2014  . Dysphagia 11/19/2014  . Trochanteric bursitis of left hip 10/08/2014  . URI (upper respiratory infection) 02/10/2014  . Pelvic pain in female 12/25/2013  . Personal history of ovarian cyst 12/25/2013  . Change in bowel movement 11/11/2013  . breast tenderness 09/29/2013  . Dysphagia, unspecified(787.20) 06/12/2012  . Barrett's esophagus 06/12/2012  . Environmental allergies 02/13/2012  . Enlarged thyroid 02/13/2012  . GERD (gastroesophageal reflux disease) 02/08/2012  . Leukopenia 02/08/2012  . Fatigue  02/08/2012  . Hypercholesterolemia 02/08/2012   MPura Spice PT, DPT # [id number]  08/06/2015, 10:38 AM  [place] [facility] [address]Phone: [phone]  Fax:  [phone] Name: SLOUIS GAWMRN: [id number] of Birth: [birth year]

## 2015-08-06 ENCOUNTER — Encounter: Payer: Self-pay | Admitting: Internal Medicine

## 2015-08-06 ENCOUNTER — Ambulatory Visit: Payer: 59

## 2015-08-06 ENCOUNTER — Ambulatory Visit (INDEPENDENT_AMBULATORY_CARE_PROVIDER_SITE_OTHER): Payer: 59 | Admitting: Internal Medicine

## 2015-08-06 ENCOUNTER — Other Ambulatory Visit: Payer: Self-pay | Admitting: Internal Medicine

## 2015-08-06 VITALS — BP 110/70 | HR 83 | Temp 98.0°F | Resp 18 | Ht 66.0 in | Wt 197.1 lb

## 2015-08-06 DIAGNOSIS — Z0000 Encounter for general adult medical examination without abnormal findings: Secondary | ICD-10-CM

## 2015-08-06 DIAGNOSIS — D72819 Decreased white blood cell count, unspecified: Secondary | ICD-10-CM

## 2015-08-06 DIAGNOSIS — K2100 Gastro-esophageal reflux disease with esophagitis, without bleeding: Secondary | ICD-10-CM

## 2015-08-06 DIAGNOSIS — E7800 Pure hypercholesterolemia, unspecified: Secondary | ICD-10-CM | POA: Diagnosis not present

## 2015-08-06 DIAGNOSIS — K2270 Barrett's esophagus without dysplasia: Secondary | ICD-10-CM

## 2015-08-06 DIAGNOSIS — Z86010 Personal history of colonic polyps: Secondary | ICD-10-CM

## 2015-08-06 LAB — COMPREHENSIVE METABOLIC PANEL
ALT: 11 U/L (ref 0–35)
AST: 13 U/L (ref 0–37)
Albumin: 4.2 g/dL (ref 3.5–5.2)
Alkaline Phosphatase: 41 U/L (ref 39–117)
BUN: 6 mg/dL (ref 6–23)
CO2: 31 mEq/L (ref 19–32)
Calcium: 9.3 mg/dL (ref 8.4–10.5)
Chloride: 102 mEq/L (ref 96–112)
Creatinine, Ser: 0.73 mg/dL (ref 0.40–1.20)
GFR: 108.47 mL/min (ref >60.00)
Glucose, Bld: 91 mg/dL (ref 70–99)
Potassium: 4.2 mEq/L (ref 3.5–5.1)
Sodium: 140 mEq/L (ref 135–145)
Total Bilirubin: 0.6 mg/dL (ref 0.2–1.2)
Total Protein: 6.5 g/dL (ref 6.0–8.3)

## 2015-08-06 LAB — CBC WITH DIFFERENTIAL/PLATELET
Basophils Absolute: 0 10*3/uL (ref 0.0–0.1)
Basophils Relative: 1 % (ref 0.0–3.0)
Eosinophils Absolute: 0 10*3/uL (ref 0.0–0.7)
Eosinophils Relative: 0.8 % (ref 0.0–5.0)
HCT: 39.2 % (ref 36.0–46.0)
Hemoglobin: 13.2 g/dL (ref 12.0–15.0)
Lymphocytes Relative: 45.9 % (ref 12.0–46.0)
Lymphs Abs: 1.5 10*3/uL (ref 0.7–4.0)
MCHC: 33.7 g/dL (ref 30.0–36.0)
MCV: 91.6 fl (ref 78.0–100.0)
Monocytes Absolute: 0.4 10*3/uL (ref 0.1–1.0)
Monocytes Relative: 11.7 % (ref 3.0–12.0)
Neutro Abs: 1.3 10*3/uL — ABNORMAL LOW (ref 1.4–7.7)
Neutrophils Relative %: 40.6 % — ABNORMAL LOW (ref 43.0–77.0)
Platelets: 210 10*3/uL (ref 150.0–400.0)
RBC: 4.28 Mil/uL (ref 3.87–5.11)
RDW: 14.8 % (ref 11.5–15.5)
WBC: 3.2 10*3/uL — ABNORMAL LOW (ref 4.0–10.5)

## 2015-08-06 LAB — LIPID PANEL
Cholesterol: 200 mg/dL (ref 0–200)
HDL: 67.7 mg/dL (ref >39.00)
LDL Cholesterol: 114 mg/dL — ABNORMAL HIGH (ref 0–99)
NonHDL: 132.59
Total CHOL/HDL Ratio: 3
Triglycerides: 93 mg/dL (ref 0.0–149.0)
VLDL: 18.6 mg/dL (ref 0.0–40.0)

## 2015-08-06 LAB — TSH: TSH: 0.66 u[IU]/mL (ref 0.35–4.50)

## 2015-08-06 NOTE — Progress Notes (Signed)
Order placed for f/u cbc.   

## 2015-08-06 NOTE — Progress Notes (Signed)
Pre-visit discussion using our clinic review tool. No additional management support is needed unless otherwise documented below in the visit note.  

## 2015-08-06 NOTE — Progress Notes (Signed)
Patient ID: Kelly Massey, female   DOB: [birth year], 50 y.o.   MRN: [id number]   Subjective:    Patient ID: Kelly Massey, female    DOB: [birth year], 50 y.o.   MRN: [id number]  HPI  Patient here for her physical exam.  She is seen by gyn for her pelvic and pap smears.  She started having burning in her throat two weeks ago.  Acid reflux.  Taking dexilant and zantac.  Increased burping.  No swallowing problems.  No abdominal pain or cramping.  Bowels stable.  No chest pain.  No sob.     Past Medical History  Diagnosis Date  . Hyperlipidemia     ? no meds - diet controlled  . Colon polyps   . HSV infection     History  . GERD (gastroesophageal reflux disease) 10/12/10    EGD, positive H. pylori  . Anemia   . Post-operative nausea and vomiting   . Seasonal allergies   . Arthritis     knees - no meds   Past Surgical History  Procedure Laterality Date  . Shoulder arthroscopy Left   . Dilation and curettage of uterus      SAB  . Abdominal hysterectomy    . Ovarian cyst removal  2004    laparotomy -left  . Esophagogastroduodenoscopy N/A 07/24/2012    Procedure: ESOPHAGOGASTRODUODENOSCOPY (EGD);  Surgeon: Inda Castle, MD;  Location: [facility];  Service: Endoscopy;  Laterality: N/A;  . Bravo ph study N/A 07/24/2012    Procedure: BRAVO [place] STUDY;  Surgeon: Inda Castle, MD;  Location: [facility];  Service: Endoscopy;  Laterality: N/A;  . Balloon dilation N/A 07/24/2012    Procedure: BALLOON DILATION;  Surgeon: Inda Castle, MD;  Location: [facility];  Service: Endoscopy;  Laterality: N/A;  . Wisdom tooth extraction    . Robotic assisted salpingo oopherectomy Left 03/21/2014    Procedure:  ROBOTIC ASSISTED LEFT OOPHORECTOMY;  Surgeon: Marvene Staff, MD;  Location: [facility];  Service: Gynecology;  Laterality: Left;  . Robotic assisted laparoscopic lysis of adhesion N/A 03/21/2014    Procedure: ROBOTIC ASSISTED LAPAROSCOPIC EXTENSIVE LYSIS OF ADHESION (1 Hour);   Surgeon: Marvene Staff, MD;  Location: [facility];  Service: Gynecology;  Laterality: N/A;  . Tennis elbow release/nirschel procedure Right 05/01/2015    Procedure: RIGHT ELBOW DEBRIDEMENT AND TENDON REPAIR;  Surgeon: [name], MD;  Location: [facility];  Service: Orthopedics;  Laterality: Right;   Family History  Problem Relation Age of Onset  . Arthritis Mother   . Breast cancer Maternal Aunt   . Stroke Mother   . Hypertension Mother   . Heart failure Mother   . Colon cancer Neg Hx   . Prostate cancer Maternal Uncle     x 2  . Diabetes Sister   . Diabetes Brother     x 2  . Diabetes Paternal Grandmother   . Hypertension Other   . Hyperlipidemia Other   . Diabetes Paternal Grandfather   . Lung cancer Father   . Diabetes Maternal Uncle   . Throat cancer Maternal Aunt     Smoker  . Dementia Mother    Social History   Social History  . Marital Status: Married    Spouse Name: N/A  . Number of Children: 0  . Years of Education: N/A   Occupational History  . RESEARCH [employer]   Social History Main Topics  . Smoking status: Never  Smoker   . Smokeless tobacco: Never Used  . Alcohol Use: No  . Drug Use: No  . Sexual Activity: Yes    Birth Control/ Protection: None     Comment: hysterectomy   Other Topics Concern  . None   Social History Narrative    Outpatient Encounter Prescriptions as of 08/06/2015  Medication Sig  . acyclovir (ZOVIRAX) 400 MG tablet Take 1 tablet (400 mg total) by mouth 2 (two) times daily as needed (for outbreaks).  . Calcium Carbonate-Vitamin D (CALCIUM 600+D3) 600-400 MG-UNIT per tablet Take 1 tablet by mouth daily.  . Cholecalciferol (VITAMIN D PO) Take 2,000 Int'l Units by mouth daily. Gummy formulation  . dexlansoprazole (DEXILANT) 60 MG capsule TAKE 1 CAPSULE (60 MG TOTAL) BY MOUTH DAILY.  . diphenhydrAMINE (BENADRYL) 12.5 MG/5ML elixir Take 12.5 mg by mouth at bedtime as needed for sleep.  . ferrous sulfate  325 (65 FE) MG tablet Take 325 mg by mouth daily.   . fluocinonide ointment (LIDEX) 0.05 % APPLY TO AFFECTED AREA OF THE SCALP 3-4 TIMES A WEEKLY  . mometasone (NASONEX) 50 MCG/ACT nasal spray Place 2 sprays into the nose daily.  . ranitidine (ZANTAC) 150 MG tablet Take 300 mg by mouth daily as needed for heartburn.   . vitamin E 400 UNIT capsule Take 400 Units by mouth daily.  . [DISCONTINUED] chlorpheniramine-HYDROcodone (TUSSIONEX PENNKINETIC ER) 10-8 MG/5ML SUER Take 5 mLs by mouth at bedtime as needed for cough.  . [DISCONTINUED] ondansetron (ZOFRAN) 4 MG tablet Take 1 tablet (4 mg total) by mouth every 8 (eight) hours as needed for nausea or vomiting.  . [DISCONTINUED] predniSONE (DELTASONE) 10 MG tablet Take 6 tablets by mouth on day 1 with breakfast then decrease by 1 tablet each day until gone.   No facility-administered encounter medications on file as of 08/06/2015.    Review of Systems  Constitutional: Negative for appetite change and unexpected weight change.  HENT: Negative for congestion and sinus pressure.   Eyes: Negative for pain and visual disturbance.  Respiratory: Negative for cough, chest tightness and shortness of breath.   Cardiovascular: Negative for chest pain, palpitations and leg swelling.  Gastrointestinal: Negative for nausea, vomiting, abdominal pain and diarrhea.       Burning and acid reflux as outlined.    Genitourinary: Negative for dysuria and difficulty urinating.  Musculoskeletal: Negative for back pain and joint swelling.  Skin: Negative for color change and rash.  Neurological: Negative for dizziness, light-headedness and headaches.  Hematological: Negative for adenopathy. Does not bruise/bleed easily.  Psychiatric/Behavioral: Negative for dysphoric mood and agitation.       Objective:    Physical Exam  Constitutional: She is oriented to person, place, and time. She appears well-developed and well-nourished. No distress.  HENT:  Nose: Nose  normal.  Mouth/Throat: Oropharynx is clear and moist.  Eyes: Right eye exhibits no discharge. Left eye exhibits no discharge. No scleral icterus.  Neck: Neck supple. No thyromegaly present.  Cardiovascular: Normal rate and regular rhythm.   Pulmonary/Chest: Breath sounds normal. No accessory muscle usage. No tachypnea. No respiratory distress. She has no decreased breath sounds. She has no wheezes. She has no rhonchi. Right breast exhibits no inverted nipple, no mass, no nipple discharge and no tenderness (no axillary adenopathy). Left breast exhibits no inverted nipple, no mass, no nipple discharge and no tenderness (no axilarry adenopathy).  Abdominal: Soft. Bowel sounds are normal. There is no tenderness.  Musculoskeletal: She exhibits no edema or tenderness.  Lymphadenopathy:    She has no cervical adenopathy.  Neurological: She is alert and oriented to person, place, and time.  Skin: Skin is warm. No rash noted. No erythema.  Psychiatric: She has a normal mood and affect. Her behavior is normal.    BP 110/70 mmHg  Pulse 83  Temp(Src) 98 F (36.7 C) (Oral)  Resp 18  Ht 5\' 6"  (1.676 m)  Wt 197 lb 2 oz (89.415 kg)  BMI 31.83 kg/m2  SpO2 95%  LMP 10/06/2011 Wt Readings from Last 3 Encounters:  08/06/15 197 lb 2 oz (89.415 kg)  06/04/15 198 lb 12 oz (90.152 kg)  05/01/15 197 lb (89.359 kg)     Lab Results  Component Value Date   WBC 3.2* 08/06/2015   HGB 13.2 08/06/2015   HCT 39.2 08/06/2015   PLT 210.0 08/06/2015   GLUCOSE 91 08/06/2015   CHOL 200 08/06/2015   TRIG 93.0 08/06/2015   HDL 67.70 08/06/2015   LDLCALC 114* 08/06/2015   ALT 11 08/06/2015   AST 13 08/06/2015   NA 140 08/06/2015   K 4.2 08/06/2015   CL 102 08/06/2015   CREATININE 0.73 08/06/2015   BUN 6 08/06/2015   CO2 31 08/06/2015   TSH 0.66 08/06/2015       Assessment & Plan:   Problem List Items Addressed This Visit    Barrett's esophagus    History of Barretts.  Return of symptoms despite  medications.  Refer back to GI.        Relevant Orders   Ambulatory referral to Gastroenterology   GERD (gastroesophageal reflux disease) - Primary    Has a history of Barretts.  Followed by GI.  On dexilant and zantac.  With reoccurring symptoms.  Refer back to GI.        Relevant Orders   CBC with Differential/Platelet (Completed)   TSH (Completed)   Ambulatory referral to Gastroenterology   Health care maintenance    Physical today 08/05/15.  Is s/p hysterectomy.  Followed by gyn.  Mammogram 4/20116.  Scheduled for f/u mammogram.   Colonoscopy 10/2010.        History of colonic polyps    Colonoscopy 10/2010 - hyperplastic polyps.        Hypercholesterolemia    Low cholesterol diet and exercise.  Follow lipid panel.        Relevant Orders   Comprehensive metabolic panel (Completed)   Lipid panel (Completed)   Leukopenia    Recheck cbc.  HIV and ANA negative.  White count has been stable.            Einar Pheasant, MD

## 2015-08-11 ENCOUNTER — Ambulatory Visit: Payer: 59

## 2015-08-15 ENCOUNTER — Ambulatory Visit
Admission: RE | Admit: 2015-08-15 | Discharge: 2015-08-15 | Disposition: A | Discharge status: Home / Self Care | Payer: 59 | Source: Ambulatory Visit

## 2015-08-15 DIAGNOSIS — Z1231 Encounter for screening mammogram for malignant neoplasm of breast: Secondary | ICD-10-CM

## 2015-08-17 ENCOUNTER — Encounter: Payer: Self-pay | Admitting: Internal Medicine

## 2015-08-17 NOTE — Assessment & Plan Note (Signed)
History of Barretts.  Return of symptoms despite medications.  Refer back to GI.

## 2015-08-17 NOTE — Assessment & Plan Note (Signed)
Has a history of Barretts.  Followed by GI.  On dexilant and zantac.  With reoccurring symptoms.  Refer back to GI.

## 2015-08-17 NOTE — Assessment & Plan Note (Signed)
Physical today 08/05/15.  Is s/p hysterectomy.  Followed by gyn.  Mammogram 4/20116.  Scheduled for f/u mammogram.   Colonoscopy 10/2010.

## 2015-08-17 NOTE — Assessment & Plan Note (Signed)
Recheck cbc.  HIV and ANA negative.  White count has been stable.

## 2015-08-17 NOTE — Assessment & Plan Note (Signed)
Colonoscopy 10/2010 - hyperplastic polyps.   

## 2015-08-17 NOTE — Assessment & Plan Note (Signed)
Low cholesterol diet and exercise.  Follow lipid panel.   

## 2015-08-21 ENCOUNTER — Other Ambulatory Visit (INDEPENDENT_AMBULATORY_CARE_PROVIDER_SITE_OTHER): Payer: 59

## 2015-08-21 DIAGNOSIS — D72819 Decreased white blood cell count, unspecified: Secondary | ICD-10-CM | POA: Diagnosis not present

## 2015-08-21 LAB — CBC WITH DIFFERENTIAL/PLATELET
Basophils Absolute: 0 10*3/uL (ref 0.0–0.1)
Basophils Relative: 0.9 % (ref 0.0–3.0)
Eosinophils Absolute: 0 10*3/uL (ref 0.0–0.7)
Eosinophils Relative: 1.3 % (ref 0.0–5.0)
HCT: 39.5 % (ref 36.0–46.0)
Hemoglobin: 13.4 g/dL (ref 12.0–15.0)
Lymphocytes Relative: 49.5 % — ABNORMAL HIGH (ref 12.0–46.0)
Lymphs Abs: 1.5 10*3/uL (ref 0.7–4.0)
MCHC: 33.9 g/dL (ref 30.0–36.0)
MCV: 91 fl (ref 78.0–100.0)
Monocytes Absolute: 0.3 10*3/uL (ref 0.1–1.0)
Monocytes Relative: 10.3 % (ref 3.0–12.0)
Neutro Abs: 1.1 10*3/uL — ABNORMAL LOW (ref 1.4–7.7)
Neutrophils Relative %: 38 % — ABNORMAL LOW (ref 43.0–77.0)
Platelets: 238 10*3/uL (ref 150.0–400.0)
RBC: 4.34 Mil/uL (ref 3.87–5.11)
RDW: 14.4 % (ref 11.5–15.5)
WBC: 3 10*3/uL — ABNORMAL LOW (ref 4.0–10.5)

## 2015-08-25 ENCOUNTER — Other Ambulatory Visit: Payer: Self-pay | Admitting: Internal Medicine

## 2015-08-25 DIAGNOSIS — D72819 Decreased white blood cell count, unspecified: Secondary | ICD-10-CM

## 2015-08-25 NOTE — Progress Notes (Signed)
Order placed for f/u cbc.   

## 2015-08-28 ENCOUNTER — Other Ambulatory Visit: Payer: Self-pay | Admitting: Internal Medicine

## 2015-08-28 NOTE — Telephone Encounter (Signed)
ok'd refill for acyclovir #60 with 2 refills.

## 2015-09-14 ENCOUNTER — Other Ambulatory Visit: Payer: Self-pay | Admitting: Internal Medicine

## 2015-09-23 ENCOUNTER — Other Ambulatory Visit (INDEPENDENT_AMBULATORY_CARE_PROVIDER_SITE_OTHER): Payer: 59

## 2015-09-23 DIAGNOSIS — D72819 Decreased white blood cell count, unspecified: Secondary | ICD-10-CM | POA: Diagnosis not present

## 2015-09-23 LAB — CBC WITH DIFFERENTIAL/PLATELET
Basophils Absolute: 0 10*3/uL (ref 0.0–0.1)
Basophils Relative: 1 % (ref 0.0–3.0)
Eosinophils Absolute: 0 10*3/uL (ref 0.0–0.7)
Eosinophils Relative: 0.9 % (ref 0.0–5.0)
HCT: 40.4 % (ref 36.0–46.0)
Hemoglobin: 13.6 g/dL (ref 12.0–15.0)
Lymphocytes Relative: 56.4 % — ABNORMAL HIGH (ref 12.0–46.0)
Lymphs Abs: 1.5 10*3/uL (ref 0.7–4.0)
MCHC: 33.8 g/dL (ref 30.0–36.0)
MCV: 90.4 fl (ref 78.0–100.0)
Monocytes Absolute: 0.3 10*3/uL (ref 0.1–1.0)
Monocytes Relative: 11.7 % (ref 3.0–12.0)
Neutro Abs: 0.8 10*3/uL — ABNORMAL LOW (ref 1.4–7.7)
Neutrophils Relative %: 30 % — ABNORMAL LOW (ref 43.0–77.0)
Platelets: 249 10*3/uL (ref 150.0–400.0)
RBC: 4.46 Mil/uL (ref 3.87–5.11)
RDW: 14.8 % (ref 11.5–15.5)
WBC: 2.6 10*3/uL — ABNORMAL LOW (ref 4.0–10.5)

## 2015-09-25 ENCOUNTER — Telehealth: Payer: Self-pay | Admitting: Internal Medicine

## 2015-09-25 NOTE — Telephone Encounter (Signed)
Pt called back regarding lab results returning your call. Thank you!

## 2015-09-25 NOTE — Telephone Encounter (Signed)
I have already spoken with patient. See result note

## 2015-09-26 ENCOUNTER — Encounter: Payer: Self-pay | Admitting: Internal Medicine

## 2015-09-26 ENCOUNTER — Other Ambulatory Visit: Payer: Self-pay | Admitting: Internal Medicine

## 2015-09-26 DIAGNOSIS — D709 Neutropenia, unspecified: Secondary | ICD-10-CM

## 2015-09-26 DIAGNOSIS — D72819 Decreased white blood cell count, unspecified: Secondary | ICD-10-CM

## 2015-09-26 NOTE — Progress Notes (Signed)
Order placed for hematology referral.  

## 2015-10-09 ENCOUNTER — Inpatient Hospital Stay: Payer: 59 | Attending: Internal Medicine | Admitting: Internal Medicine

## 2015-10-09 ENCOUNTER — Encounter: Payer: Self-pay | Admitting: Internal Medicine

## 2015-10-09 ENCOUNTER — Inpatient Hospital Stay: Payer: 59

## 2015-10-09 ENCOUNTER — Other Ambulatory Visit: Payer: Self-pay | Admitting: Gastroenterology

## 2015-10-09 VITALS — BP 123/73 | HR 80 | Temp 97.5°F | Resp 18 | Ht 66.0 in | Wt 189.2 lb

## 2015-10-09 DIAGNOSIS — M170 Bilateral primary osteoarthritis of knee: Secondary | ICD-10-CM | POA: Diagnosis not present

## 2015-10-09 DIAGNOSIS — K219 Gastro-esophageal reflux disease without esophagitis: Secondary | ICD-10-CM | POA: Insufficient documentation

## 2015-10-09 DIAGNOSIS — D708 Other neutropenia: Secondary | ICD-10-CM | POA: Diagnosis not present

## 2015-10-09 DIAGNOSIS — Z79899 Other long term (current) drug therapy: Secondary | ICD-10-CM | POA: Diagnosis not present

## 2015-10-09 DIAGNOSIS — Z86010 Personal history of colonic polyps: Secondary | ICD-10-CM | POA: Insufficient documentation

## 2015-10-09 DIAGNOSIS — E785 Hyperlipidemia, unspecified: Secondary | ICD-10-CM | POA: Diagnosis not present

## 2015-10-09 DIAGNOSIS — R1310 Dysphagia, unspecified: Secondary | ICD-10-CM

## 2015-10-09 LAB — CBC WITH DIFFERENTIAL/PLATELET
Basophils Absolute: 0 10*3/uL (ref 0–0.1)
Basophils Relative: 1 %
Eosinophils Absolute: 0 10*3/uL (ref 0–0.7)
Eosinophils Relative: 1 %
HCT: 41.6 % (ref 35.0–47.0)
Hemoglobin: 14.3 g/dL (ref 12.0–16.0)
Lymphocytes Relative: 40 %
Lymphs Abs: 1.6 10*3/uL (ref 1.0–3.6)
MCH: 31.2 pg (ref 26.0–34.0)
MCHC: 34.3 g/dL (ref 32.0–36.0)
MCV: 90.9 fL (ref 80.0–100.0)
Monocytes Absolute: 0.3 10*3/uL (ref 0.2–0.9)
Monocytes Relative: 9 %
Neutro Abs: 2 10*3/uL (ref 1.4–6.5)
Neutrophils Relative %: 49 %
Platelets: 229 10*3/uL (ref 150–440)
RBC: 4.57 MIL/uL (ref 3.80–5.20)
RDW: 13.7 % (ref 11.5–14.5)
WBC: 4.1 10*3/uL (ref 3.6–11.0)

## 2015-10-09 LAB — LACTATE DEHYDROGENASE: LDH: 145 U/L (ref 98–192)

## 2015-10-09 LAB — COMPREHENSIVE METABOLIC PANEL
ALT: 20 U/L (ref 14–54)
AST: 21 U/L (ref 15–41)
Albumin: 4.6 g/dL (ref 3.5–5.0)
Alkaline Phosphatase: 50 U/L (ref 38–126)
Anion gap: 5 (ref 5–15)
BUN: 9 mg/dL (ref 6–20)
CO2: 30 mmol/L (ref 22–32)
Calcium: 9.3 mg/dL (ref 8.9–10.3)
Chloride: 101 mmol/L (ref 101–111)
Creatinine, Ser: 0.75 mg/dL (ref 0.44–1.00)
GFR calc Af Amer: >60 mL/min (ref >60)
GFR calc non Af Amer: >60 mL/min (ref >60)
Glucose, Bld: 88 mg/dL (ref 65–99)
Potassium: 4.5 mmol/L (ref 3.5–5.1)
Sodium: 136 mmol/L (ref 135–145)
Total Bilirubin: 0.4 mg/dL (ref 0.3–1.2)
Total Protein: 7.6 g/dL (ref 6.5–8.1)

## 2015-10-09 LAB — C-REACTIVE PROTEIN: CRP: 0.6 mg/dL (ref <1.0)

## 2015-10-09 NOTE — Assessment & Plan Note (Signed)
Mild leukopenia -intermittent neutropenia nadir 800 ANC. The etiology is unclear question viral syndrome versus medications versus chronic benign ethinic causes.   Patient is asymptomatic. Recommend holding PPI for now [although less likely a cause]  # Recommend checking a CBC CMP and LDH today. Also recommend checking CBC on a monthly basis. If the patient continues to drop her [place] would recommend a bone marrow biopsy. The bone marrow biopsy procedure was discussed with the patient- However we will await for a trend of her [place].  The above plan of care was discussed the patient in detail.  Thank you Dr.Scott for allowing me to participate in the care of your pleasant patient. Please do not hesitate to contact me with questions or concerns in the interim.

## 2015-10-09 NOTE — Progress Notes (Signed)
[facility] CONSULT NOTE  Patient Care Team: Einar Pheasant, MD as PCP - General (Unknown Physician Specialty)  CHIEF COMPLAINTS/PURPOSE OF CONSULTATION:  Leucopenia  HISTORY OF PRESENTING ILLNESS:  Kelly Massey 50 y.o.  female with prior history of intermittent leukopenia/neutropenia which was initially attributed to PPI- has been referred to [place] to back to [place] for [place]rop[place].  Patient states that she was recently started on PPI- noted to have drop of her white count- nadir ANC 800. Patient denies any frequent infections; denies any frequent use of antibiotics. Denies any new medications. Denies any alcohol.   ROS: A complete 10 point review of system is done which is negative except mentioned above in history of present illness  MEDICAL HISTORY:  Past Medical History  Diagnosis Date  . Hyperlipidemia     ? no meds - diet controlled  . Colon polyps   . HSV infection     History  . GERD (gastroesophageal reflux disease) 10/12/10    EGD, positive H. pylori  . Anemia   . Post-operative nausea and vomiting   . Seasonal allergies   . Arthritis     knees - no meds  . Allergy   . Asthma     SURGICAL HISTORY: Past Surgical History  Procedure Laterality Date  . Shoulder arthroscopy Left   . Dilation and curettage of uterus      SAB  . Abdominal hysterectomy    . Ovarian cyst removal  2004    laparotomy -left  . Esophagogastroduodenoscopy N/A 07/24/2012    Procedure: ESOPHAGOGASTRODUODENOSCOPY (EGD);  Surgeon: Inda Castle, MD;  Location: [facility];  Service: Endoscopy;  Laterality: N/A;  . Bravo ph study N/A 07/24/2012    Procedure: BRAVO [place] STUDY;  Surgeon: Inda Castle, MD;  Location: [facility];  Service: Endoscopy;  Laterality: N/A;  . Balloon dilation N/A 07/24/2012    Procedure: BALLOON DILATION;  Surgeon: Inda Castle, MD;  Location: [facility];  Service: Endoscopy;  Laterality: N/A;  . Wisdom tooth extraction    . Robotic assisted salpingo  oopherectomy Left 03/21/2014    Procedure:  ROBOTIC ASSISTED LEFT OOPHORECTOMY;  Surgeon: Marvene Staff, MD;  Location: [facility];  Service: Gynecology;  Laterality: Left;  . Robotic assisted laparoscopic lysis of adhesion N/A 03/21/2014    Procedure: ROBOTIC ASSISTED LAPAROSCOPIC EXTENSIVE LYSIS OF ADHESION (1 Hour);  Surgeon: Marvene Staff, MD;  Location: [facility];  Service: Gynecology;  Laterality: N/A;  . Tennis elbow release/nirschel procedure Right 05/01/2015    Procedure: RIGHT ELBOW DEBRIDEMENT AND TENDON REPAIR;  Surgeon: [name], MD;  Location: [facility];  Service: Orthopedics;  Laterality: Right;    SOCIAL HISTORY: Social History   Social History  . Marital Status: Married    Spouse Name: N/A  . Number of Children: 0  . Years of Education: N/A   Occupational History  . RESEARCH [employer]   Social History Main Topics  . Smoking status: Never Smoker   . Smokeless tobacco: Never Used  . Alcohol Use: No  . Drug Use: No  . Sexual Activity: Yes    Birth Control/ Protection: None     Comment: hysterectomy   Other Topics Concern  . Not on file   Social History Narrative    FAMILY HISTORY: Family History  Problem Relation Age of Onset  . Arthritis Mother   . Breast cancer Maternal Aunt   . Stroke Mother   . Hypertension Mother   .  Heart failure Mother   . Colon cancer Neg Hx   . Prostate cancer Maternal Uncle     x 2  . Diabetes Sister   . Diabetes Brother     x 2  . Diabetes Paternal Grandmother   . Hypertension Other   . Hyperlipidemia Other   . Diabetes Paternal Grandfather   . Lung cancer Father   . Diabetes Maternal Uncle   . Throat cancer Maternal Aunt     Smoker  . Dementia Mother     ALLERGIES:  is allergic to adhesive; aspirin; ibuprofen; penicillins; nsaids; oxycodone; and shrimp.  MEDICATIONS:  Current Outpatient Prescriptions  Medication Sig Dispense Refill  . Calcium Carbonate-Vitamin D (CALCIUM  600+D3) 600-400 MG-UNIT per tablet Take 1 tablet by mouth daily.    . ferrous sulfate 325 (65 FE) MG tablet Take 325 mg by mouth daily.     . Multiple Vitamins-Minerals (MULTIVITAMIN WITH MINERALS) tablet Take 1 tablet by mouth daily.    . ranitidine (ZANTAC) 150 MG tablet Take 300 mg by mouth daily as needed for heartburn.     . vitamin E 400 UNIT capsule Take 400 Units by mouth daily. Reported on 10/09/2015     No current facility-administered medications for this visit.      Marland Kitchen  PHYSICAL EXAMINATION:   Filed Vitals:   10/09/15 1409  BP: 123/73  Pulse: 80  Temp: 97.5 F (36.4 C)  Resp: 18   Filed Weights   10/09/15 1409  Weight: 189 lb 2.5 oz (85.8 kg)    GENERAL: Well-nourished well-developed; Alert, no distress and comfortable.  Alone.  EYES: no pallor or icterus OROPHARYNX: no thrush or ulceration; good dentition  NECK: supple, no masses felt LYMPH:  no palpable lymphadenopathy in the cervical, axillary or inguinal regions LUNGS: clear to auscultation and  No wheeze or crackles HEART/CVS: regular rate & rhythm and no murmurs; No lower extremity edema ABDOMEN: abdomen soft, non-tender and normal bowel sounds Musculoskeletal:no cyanosis of digits and no clubbing  PSYCH: alert & oriented x 3 with fluent speech NEURO: no focal motor/sensory deficits SKIN:  no rashes or significant lesions  LABORATORY DATA:  I have reviewed the data as listed Lab Results  Component Value Date   WBC 4.1 10/09/2015   HGB 14.3 10/09/2015   HCT 41.6 10/09/2015   MCV 90.9 10/09/2015   PLT 229 10/09/2015    Recent Labs  02/10/15 0845 08/06/15 0945 10/09/15 1500  NA 138 140 136  K 4.2 4.2 4.5  CL 102 102 101  CO2 29 31 30   GLUCOSE 99 91 88  BUN 8 6 9   CREATININE 0.73 0.73 0.75  CALCIUM 9.5 9.3 9.3  GFRNONAA  --   --  >60  GFRAA  --   --  >60  PROT 7.0 6.5 7.6  ALBUMIN 4.2 4.2 4.6  AST 18 13 21   ALT 13 11 20   ALKPHOS 43 41 50  BILITOT 0.5 0.6 0.4    RADIOGRAPHIC  STUDIES: I have personally reviewed the radiological images as listed and agreed with the findings in the report. No results found.  ASSESSMENT & PLAN:  Neutropenia (HCC) Mild leukopenia -intermittent neutropenia nadir 800 ANC. The etiology is unclear question viral syndrome versus medications versus chronic benign ethinic causes.   Patient is asymptomatic. Recommend holding PPI for now [although less likely a cause]  # Recommend checking a CBC CMP and LDH today. Also recommend checking CBC on a monthly basis. If the patient continues  to drop her [place] would recommend a bone marrow biopsy. The bone marrow biopsy procedure was discussed with the patient- However we will await for a trend of her [place].  The above plan of care was discussed the patient in detail.  Thank you Dr.Scott for allowing me to participate in the care of your pleasant patient. Please do not hesitate to contact me with questions or concerns in the interim.   All questions were answered. The patient knows to call the clinic with any problems, questions or concerns.  # 30 minutes face-to-face with the patient discussing the above plan of care; more than 50% of time spent on counseling and coordination.       Cammie Sickle, MD 10/09/2015 6:50 PM

## 2015-10-09 NOTE — Progress Notes (Signed)
Patient former patient of Dr. Loistine Simas.  Referred back by Dr. Einar Pheasant regarding leukopenia.  Patient c/o right shoulder pain.  Also states her throat is sore.

## 2015-10-10 LAB — HEPATITIS C ANTIBODY: HCV Ab: <0.1 s/co ratio (ref 0.0–0.9)

## 2015-10-10 LAB — HEPATITIS B SURFACE ANTIGEN: Hepatitis B Surface Ag: NEGATIVE

## 2015-10-10 LAB — HIV ANTIBODY (ROUTINE TESTING W REFLEX): HIV Screen 4th Generation wRfx: NONREACTIVE

## 2015-10-10 LAB — HEPATITIS B CORE ANTIBODY, IGM: Hep B C IgM: NEGATIVE

## 2015-10-13 ENCOUNTER — Ambulatory Visit
Admission: RE | Admit: 2015-10-13 | Discharge: 2015-10-13 | Disposition: A | Discharge status: Home / Self Care | Payer: 59 | Source: Ambulatory Visit | Attending: Gastroenterology | Admitting: Gastroenterology

## 2015-10-13 DIAGNOSIS — K219 Gastro-esophageal reflux disease without esophagitis: Secondary | ICD-10-CM | POA: Diagnosis not present

## 2015-10-13 DIAGNOSIS — R1310 Dysphagia, unspecified: Secondary | ICD-10-CM | POA: Diagnosis present

## 2015-10-13 DIAGNOSIS — K449 Diaphragmatic hernia without obstruction or gangrene: Secondary | ICD-10-CM | POA: Diagnosis not present

## 2015-11-10 ENCOUNTER — Ambulatory Visit (INDEPENDENT_AMBULATORY_CARE_PROVIDER_SITE_OTHER): Payer: 59 | Admitting: Internal Medicine

## 2015-11-10 ENCOUNTER — Encounter (INDEPENDENT_AMBULATORY_CARE_PROVIDER_SITE_OTHER): Payer: Self-pay

## 2015-11-10 ENCOUNTER — Inpatient Hospital Stay: Payer: 59 | Attending: Internal Medicine

## 2015-11-10 VITALS — BP 128/84 | HR 80 | Temp 98.3°F | Resp 14 | Wt 192.0 lb

## 2015-11-10 DIAGNOSIS — D709 Neutropenia, unspecified: Secondary | ICD-10-CM | POA: Diagnosis present

## 2015-11-10 DIAGNOSIS — M25511 Pain in right shoulder: Secondary | ICD-10-CM | POA: Diagnosis not present

## 2015-11-10 DIAGNOSIS — E7800 Pure hypercholesterolemia, unspecified: Secondary | ICD-10-CM | POA: Diagnosis not present

## 2015-11-10 DIAGNOSIS — K2100 Gastro-esophageal reflux disease with esophagitis, without bleeding: Secondary | ICD-10-CM

## 2015-11-10 DIAGNOSIS — D72819 Decreased white blood cell count, unspecified: Secondary | ICD-10-CM

## 2015-11-10 DIAGNOSIS — K2270 Barrett's esophagus without dysplasia: Secondary | ICD-10-CM

## 2015-11-10 LAB — CBC WITH DIFFERENTIAL/PLATELET
Basophils Absolute: 0.1 10*3/uL (ref 0–0.1)
Basophils Relative: 1 %
Eosinophils Absolute: 0 10*3/uL (ref 0–0.7)
Eosinophils Relative: 1 %
HCT: 39.5 % (ref 35.0–47.0)
Hemoglobin: 13.5 g/dL (ref 12.0–16.0)
Lymphocytes Relative: 45 %
Lymphs Abs: 1.9 10*3/uL (ref 1.0–3.6)
MCH: 31 pg (ref 26.0–34.0)
MCHC: 34.1 g/dL (ref 32.0–36.0)
MCV: 90.9 fL (ref 80.0–100.0)
Monocytes Absolute: 0.5 10*3/uL (ref 0.2–0.9)
Monocytes Relative: 11 %
Neutro Abs: 1.8 10*3/uL (ref 1.4–6.5)
Neutrophils Relative %: 42 %
Platelets: 201 10*3/uL (ref 150–440)
RBC: 4.35 MIL/uL (ref 3.80–5.20)
RDW: 13.9 % (ref 11.5–14.5)
WBC: 4.2 10*3/uL (ref 3.6–11.0)

## 2015-11-10 NOTE — Progress Notes (Signed)
Patient ID: Kelly Massey, female   DOB: [birth year], 50 y.o.   MRN: [id number]   Subjective:    Patient ID: Kelly Massey, female    DOB: Aug 09, 1965, 50 y.o.   MRN: [id number]  HPI  Patient here for a scheduled follow up.  She is having some right shoulder discomfort.  Saw ortho.  Planning for surgery in the future.  Not scheduled.  Is planning to f/u with ortho in the future.  Not taking antiinflammatories. Is off her dexilant.  Being worked up by hematology for leukopenia/neutrapenia.  Off to see if related to the dexilant.  Just had cbc.  White count improved.  She is having increased acid reflux - off dexilant.  She has previously tried zegrid, nexium and aciphex.  Taking zantac now and having increased reflux.  Tries to stay active.  No cardiac symptoms with increased activity or exertion.  No sob.  No abdominal pain.  Bowels stable.    Past Medical History:  Diagnosis Date  . Allergy   . Anemia   . Arthritis    knees - no meds  . Asthma   . Colon polyps   . GERD (gastroesophageal reflux disease) 10/12/10   EGD, positive H. pylori  . HSV infection    History  . Hyperlipidemia    ? no meds - diet controlled  . Post-operative nausea and vomiting   . Seasonal allergies    Past Surgical History:  Procedure Laterality Date  . ABDOMINAL HYSTERECTOMY    . BALLOON DILATION N/A 07/24/2012   Procedure: BALLOON DILATION;  Surgeon: Inda Castle, MD;  Location: [facility];  Service: Endoscopy;  Laterality: N/A;  . BRAVO [place] STUDY N/A 07/24/2012   Procedure: BRAVO [place] STUDY;  Surgeon: Inda Castle, MD;  Location: [facility];  Service: Endoscopy;  Laterality: N/A;  . DILATION AND CURETTAGE OF UTERUS     SAB  . ESOPHAGOGASTRODUODENOSCOPY N/A 07/24/2012   Procedure: ESOPHAGOGASTRODUODENOSCOPY (EGD);  Surgeon: Inda Castle, MD;  Location: [facility];  Service: Endoscopy;  Laterality: N/A;  . OVARIAN CYST REMOVAL  2004   laparotomy -left  . ROBOTIC ASSISTED LAPAROSCOPIC LYSIS OF  ADHESION N/A 03/21/2014   Procedure: ROBOTIC ASSISTED LAPAROSCOPIC EXTENSIVE LYSIS OF ADHESION (1 Hour);  Surgeon: Marvene Staff, MD;  Location: [facility];  Service: Gynecology;  Laterality: N/A;  . ROBOTIC ASSISTED SALPINGO OOPHERECTOMY Left 03/21/2014   Procedure:  ROBOTIC ASSISTED LEFT OOPHORECTOMY;  Surgeon: Marvene Staff, MD;  Location: [facility];  Service: Gynecology;  Laterality: Left;  . SHOULDER ARTHROSCOPY Left   . TENNIS ELBOW RELEASE/NIRSCHEL PROCEDURE Right 05/01/2015   Procedure: RIGHT ELBOW DEBRIDEMENT AND TENDON REPAIR;  Surgeon: [name], MD;  Location: [facility];  Service: Orthopedics;  Laterality: Right;  . WISDOM TOOTH EXTRACTION     Family History  Problem Relation Age of Onset  . Arthritis Mother   . Stroke Mother   . Hypertension Mother   . Heart failure Mother   . Dementia Mother   . Breast cancer Maternal Aunt   . Prostate cancer Maternal Uncle     x 2  . Diabetes Sister   . Diabetes Brother     x 2  . Diabetes Paternal Grandmother   . Hypertension Other   . Hyperlipidemia Other   . Diabetes Paternal Grandfather   . Lung cancer Father   . Diabetes Maternal Uncle   . Throat cancer Maternal Aunt     Smoker  .  Colon cancer Neg Hx    Social History   Social History  . Marital status: Married    Spouse name: N/A  . Number of children: 0  . 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 Smoking status: Never Smoker  . Smokeless tobacco: Never Used  . Alcohol use No  . Drug use: No  . Sexual activity: Yes    Birth control/ protection: None     Comment: hysterectomy   Other Topics Concern  . None   Social History Narrative  . None    Outpatient Encounter Prescriptions as of 11/10/2015  Medication Sig  . Calcium Carbonate-Vitamin D (CALCIUM 600+D3) 600-400 MG-UNIT per tablet Take 1 tablet by mouth daily.  . ferrous sulfate 325 (65 FE) MG tablet Take  325 mg by mouth daily.   . Multiple Vitamins-Minerals (MULTIVITAMIN WITH MINERALS) tablet Take 1 tablet by mouth daily.  . vitamin E 400 UNIT capsule Take 400 Units by mouth daily. Reported on 10/09/2015  . ranitidine (ZANTAC) 150 MG tablet Take 300 mg by mouth daily as needed for heartburn.    No facility-administered encounter medications on file as of 11/10/2015.     Review of Systems  Constitutional: Negative for appetite change and unexpected weight change.  HENT: Negative for congestion and sinus pressure.   Respiratory: Negative for cough, chest tightness and shortness of breath.   Cardiovascular: Negative for chest pain, palpitations and leg swelling.  Gastrointestinal: Negative for abdominal pain, diarrhea, nausea and vomiting.       Acid reflux as outlined.   Musculoskeletal: Negative for back pain.       Right shoulder pain as outlined.    Skin: Negative for color change and rash.  Neurological: Negative for dizziness, light-headedness and headaches.  Psychiatric/Behavioral: Negative for agitation and dysphoric mood.       Objective:    Physical Exam  Constitutional: She appears well-developed and well-nourished. No distress.  HENT:  Nose: Nose normal.  Mouth/Throat: Oropharynx is clear and moist.  Neck: Neck supple. No thyromegaly present.  Cardiovascular: Normal rate and regular rhythm.   Pulmonary/Chest: Breath sounds normal. No respiratory distress. She has no wheezes.  Abdominal: Soft. Bowel sounds are normal. There is no tenderness.  Musculoskeletal: She exhibits no edema or tenderness.  Lymphadenopathy:    She has no cervical adenopathy.  Skin: No rash noted. No erythema.  Psychiatric: She has a normal mood and affect. Her behavior is normal.    BP 128/84   Pulse 80   Temp 98.3 F (36.8 C)   Resp 14   Wt 192 lb (87.1 kg)   LMP 10/06/2011   BMI 30.99 kg/m  Wt Readings from Last 3 Encounters:  11/10/15 192 lb (87.1 kg)  10/09/15 189 lb 2.5 oz (85.8 kg)    08/06/15 197 lb 2 oz (89.4 kg)     Lab Results  Component Value Date   WBC 4.2 11/10/2015   HGB 13.5 11/10/2015   HCT 39.5 11/10/2015   PLT 201 11/10/2015   GLUCOSE 88 10/09/2015   CHOL 200 08/06/2015   TRIG 93.0 08/06/2015   HDL 67.70 08/06/2015   LDLCALC 114 (H) 08/06/2015   ALT 20 10/09/2015   AST 21 10/09/2015   NA 136 10/09/2015   K 4.5 10/09/2015   CL 101 10/09/2015   CREATININE 0.75 10/09/2015   BUN 9 10/09/2015   CO2 30 10/09/2015   TSH  0.66 08/06/2015    Dg Esophagus  Result Date: 10/13/2015 CLINICAL DATA:  Worsening of acid reflux symptoms ; difficulty swallowing solids ; throat and chest burning and tight sensation when swallowing. Esophageal dilation in October 2016 EXAM: ESOPHOGRAM / BARIUM SWALLOW / BARIUM TABLET STUDY TECHNIQUE: Combined double contrast and single contrast examination performed using effervescent crystals, thick barium liquid, and thin barium liquid. The patient was observed with fluoroscopy swallowing a 13 mm barium sulphate tablet. FLUOROSCOPY TIME:  Fluoroscopy Time:  0 minutes, 54 seconds Number of Acquired Images:  14 COMPARISON:  None in PACs FINDINGS: The patient ingested the thick and thin barium and gas forming crystals without difficulty. The cervical esophagus distended well. There was no laryngeal penetration of the barium. The thoracic esophagus distended well. There was a small reducible hiatal hernia. A small amount of gastroesophageal reflux was observed with changing of patient positions. The esophageal mucosa appeared smooth. No ulceration or stricture was observed. The barium tablet passed without difficulty. The stomach was normal in a survey fashion. IMPRESSION: Small reducible hiatal hernia with small amount of gastroesophageal reflux. There is no evidence of a stricture nor of esophagitis. Electronically Signed   By: David  Martinique M.D.   On: 10/13/2015 09:43       Assessment & Plan:   Problem List Items Addressed This Visit     Barrett's esophagus    Followed by GI.  Increased acid reflux since being off dexilant.  Restart.       GERD (gastroesophageal reflux disease)    Has a history of Barretts.  Followed by GI. Previously on dexilant and controlled.  Off dexilant now to see if white blood cell count improves.  With increased acid reflxu off dexilant.  Will restart.  Notify hematology.  Follow symptoms.       Hypercholesterolemia    Low cholesterol diet and exercise.  Follow lipid panel.        Leukopenia    HIV and hepatitis panel negative.  Being worked up by hematology.  Recent cbc wnl.  Off dexilant.  Needs to restart dexilant, given increased acid reflux.  Continue f/u with hematology.         Other Visit Diagnoses    Right shoulder pain    -  Primary   being followed by ortho.        Einar Pheasant, MD

## 2015-11-10 NOTE — Progress Notes (Signed)
Pre visit review using our clinic review tool, if applicable. No additional management support is needed unless otherwise documented below in the visit note. 

## 2015-11-11 ENCOUNTER — Encounter: Payer: Self-pay | Admitting: Internal Medicine

## 2015-11-11 ENCOUNTER — Telehealth: Payer: Self-pay | Admitting: *Deleted

## 2015-11-11 NOTE — Telephone Encounter (Signed)
Spoke with patient. Lab Results provided to patient. I asked pt to keep her future appointments.  Labs are stable. MD will continue to monitor labs as planned.  Teach back process performed with patient.

## 2015-11-11 NOTE — Telephone Encounter (Signed)
-----   Message from Cammie Sickle, MD sent at 11/11/2015  8:02 AM EDT ----- Please inform pt that labs- look okay; monitor for now as planned. Thx

## 2015-11-11 NOTE — Assessment & Plan Note (Signed)
Has a history of Barretts.  Followed by GI. Previously on dexilant and controlled.  Off dexilant now to see if white blood cell count improves.  With increased acid reflxu off dexilant.  Will restart.  Notify hematology.  Follow symptoms.

## 2015-11-11 NOTE — Assessment & Plan Note (Signed)
Followed by GI.  Increased acid reflux since being off dexilant.  Restart.

## 2015-11-11 NOTE — Assessment & Plan Note (Signed)
HIV and hepatitis panel negative.  Being worked up by hematology.  Recent cbc wnl.  Off dexilant.  Needs to restart dexilant, given increased acid reflux.  Continue f/u with hematology.

## 2015-11-11 NOTE — Assessment & Plan Note (Signed)
Low cholesterol diet and exercise.  Follow lipid panel.   

## 2015-12-10 ENCOUNTER — Other Ambulatory Visit: Payer: Self-pay

## 2015-12-10 ENCOUNTER — Inpatient Hospital Stay: Payer: 59 | Attending: Internal Medicine

## 2015-12-10 DIAGNOSIS — D72819 Decreased white blood cell count, unspecified: Secondary | ICD-10-CM | POA: Insufficient documentation

## 2015-12-10 DIAGNOSIS — D708 Other neutropenia: Secondary | ICD-10-CM

## 2015-12-10 DIAGNOSIS — D709 Neutropenia, unspecified: Secondary | ICD-10-CM | POA: Diagnosis not present

## 2015-12-10 LAB — CBC WITH DIFFERENTIAL/PLATELET
Basophils Absolute: 0 10*3/uL (ref 0–0.1)
Basophils Relative: 1 %
Eosinophils Absolute: 0 10*3/uL (ref 0–0.7)
Eosinophils Relative: 1 %
HCT: 37.6 % (ref 35.0–47.0)
Hemoglobin: 12.9 g/dL (ref 12.0–16.0)
Lymphocytes Relative: 39 %
Lymphs Abs: 1.7 10*3/uL (ref 1.0–3.6)
MCH: 30.8 pg (ref 26.0–34.0)
MCHC: 34.4 g/dL (ref 32.0–36.0)
MCV: 89.7 fL (ref 80.0–100.0)
Monocytes Absolute: 0.6 10*3/uL (ref 0.2–0.9)
Monocytes Relative: 13 %
Neutro Abs: 2.1 10*3/uL (ref 1.4–6.5)
Neutrophils Relative %: 46 %
Platelets: 212 10*3/uL (ref 150–440)
RBC: 4.2 MIL/uL (ref 3.80–5.20)
RDW: 13.8 % (ref 11.5–14.5)
WBC: 4.4 10*3/uL (ref 3.6–11.0)

## 2015-12-11 ENCOUNTER — Telehealth: Payer: Self-pay

## 2015-12-11 ENCOUNTER — Telehealth: Payer: Self-pay | Admitting: *Deleted

## 2015-12-11 NOTE — Telephone Encounter (Signed)
Unable to reach patient - on home phone.  Phone just rings- unable to leave msg on home phone.   Left msg on pt's cell asking pt to contact our office to discuss results.

## 2015-12-11 NOTE — Telephone Encounter (Signed)
Returned pt's VM.  I left another VM that we were returning her call and to call us back with any further questions.

## 2015-12-11 NOTE — Telephone Encounter (Signed)
-----   Message from Cammie Sickle, MD sent at 12/10/2015  6:43 PM EDT ----- Please inform patient that her labs look normal; follow-up labs/ appointment as planned.

## 2016-01-03 NOTE — H&P (Signed)
ORTHOPAEDIC HISTORY & PHYSICAL  DEEM DONLON MRN:  [id number] DOB/SEX:  1965/04/06/female  CHIEF COMPLAINT:  Painful right shoulder  HISTORY: Patient is a 50 y.o. female presented with a history of pain in the right shoulder for 3 months. Onset of symptoms was gradual starting 3 months ago with gradually worsening course since that time. Prior procedures on the shoulder are none. Patient has been treated conservatively with over-the-counter NSAIDs and activity modification. Patient currently rates pain at 8 out of 10 with activity. There is pain at night. present.  They have been previously treated with: NSAIDS: NSAID, Steriods with mild improvement    PAST MEDICAL HISTORY: Patient Active Problem List   Diagnosis Date Noted  . Neutropenia ([place]) 10/09/2015  . Health care maintenance 11/21/2014  . History of colonic polyps 11/21/2014  . Dysphagia 11/19/2014  . Trochanteric bursitis of left hip 10/08/2014  . URI (upper respiratory infection) 02/10/2014  . Pelvic pain in female 12/25/2013  . Personal history of ovarian cyst 12/25/2013  . Change in bowel movement 11/11/2013  . breast tenderness 09/29/2013  . Dysphagia, unspecified(787.20) 06/12/2012  . Barrett's esophagus 06/12/2012  . Environmental allergies 02/13/2012  . Enlarged thyroid 02/13/2012  . GERD (gastroesophageal reflux disease) 02/08/2012  . Leukopenia 02/08/2012  . Fatigue 02/08/2012  . Hypercholesterolemia 02/08/2012   Past Medical History:  Diagnosis Date  . Allergy   . Anemia   . Arthritis    knees - no meds  . Asthma   . Colon polyps   . GERD (gastroesophageal reflux disease) 10/12/10   EGD, positive H. pylori  . HSV infection    History  . Hyperlipidemia    ? no meds - diet controlled  . Post-operative nausea and vomiting   . Seasonal allergies    Past Surgical History:  Procedure Laterality Date  . ABDOMINAL HYSTERECTOMY    . BALLOON DILATION N/A 07/24/2012   Procedure: BALLOON DILATION;   Surgeon: Inda Castle, MD;  Location: [facility];  Service: Endoscopy;  Laterality: N/A;  . BRAVO [place] STUDY N/A 07/24/2012   Procedure: BRAVO [place] STUDY;  Surgeon: Inda Castle, MD;  Location: [facility];  Service: Endoscopy;  Laterality: N/A;  . DILATION AND CURETTAGE OF UTERUS     SAB  . ESOPHAGOGASTRODUODENOSCOPY N/A 07/24/2012   Procedure: ESOPHAGOGASTRODUODENOSCOPY (EGD);  Surgeon: Inda Castle, MD;  Location: [facility];  Service: Endoscopy;  Laterality: N/A;  . OVARIAN CYST REMOVAL  2004   laparotomy -left  . ROBOTIC ASSISTED LAPAROSCOPIC LYSIS OF ADHESION N/A 03/21/2014   Procedure: ROBOTIC ASSISTED LAPAROSCOPIC EXTENSIVE LYSIS OF ADHESION (1 Hour);  Surgeon: Marvene Staff, MD;  Location: [facility];  Service: Gynecology;  Laterality: N/A;  . ROBOTIC ASSISTED SALPINGO OOPHERECTOMY Left 03/21/2014   Procedure:  ROBOTIC ASSISTED LEFT OOPHORECTOMY;  Surgeon: Marvene Staff, MD;  Location: [facility];  Service: Gynecology;  Laterality: Left;  . SHOULDER ARTHROSCOPY Left   . TENNIS ELBOW RELEASE/NIRSCHEL PROCEDURE Right 05/01/2015   Procedure: RIGHT ELBOW DEBRIDEMENT AND TENDON REPAIR;  Surgeon: [name], MD;  Location: [facility];  Service: Orthopedics;  Laterality: Right;  . WISDOM TOOTH EXTRACTION       MEDICATIONS:   No prescriptions prior to admission.    ALLERGIES:   Allergies  Allergen Reactions  . Adhesive [Tape] Other (See Comments)    Skin appeared "burned" after last surg.  . Aspirin Nausea And Vomiting  . Ibuprofen Nausea And Vomiting  . Penicillins Diarrhea  . Nsaids  Other (See Comments)    GI pain  . Oxycodone Other (See Comments)    Severe abdominal cramps  . Shrimp [Shellfish Allergy] Nausea And Vomiting    REVIEW OF SYSTEMS:  Pertinent items are noted in HPI.   FAMILY HISTORY:   Family History  Problem Relation Age of Onset  . Arthritis Mother   . Stroke Mother   . Hypertension Mother   . Heart failure Mother   .  Dementia Mother   . Breast cancer Maternal Aunt   . Prostate cancer Maternal Uncle     x 2  . Diabetes Sister   . Diabetes Brother     x 2  . Diabetes Paternal Grandmother   . Hypertension Other   . Hyperlipidemia Other   . Diabetes Paternal Grandfather   . Lung cancer Father   . Diabetes Maternal Uncle   . Throat cancer Maternal Aunt     Smoker  . Colon cancer Neg Hx     SOCIAL HISTORY:   Social History  Substance Use Topics  . Smoking status: Never Smoker  . Smokeless tobacco: Never Used  . Alcohol use No      EXAMINATION: Vital signs in last 24 hours: BP: ()/()  Arterial Line BP: ()/()   Head is normocephalic.   Eyes:  Pupils equal, round and reactive to light and accommodation.  Extraocular intact. ENT: Ears, nose, and throat were benign.   Neck: supple, no bruits were noted.   Chest: good expansion.   Lungs: essentially clear.   Cardiac: regular rhythm and rate, normal S1, S2.  No murmurs appreciated. Pulses :  1+ bilateral and symmetric in lower extremities. Abdomen is scaphoid, soft, nontender, no masses palpable, normal bowel sounds                  present. CNS:  He is oriented x3 and cranial nerves II-XII grossly intact. Breast, rectal, and genital exams: not performed and not indicated for an orthopedic evaluation. Musculoskeletal:   LMP 10/06/2011   General Appearance:    Alert, cooperative, no distress, appears stated age  Head:    Normocephalic, without obvious abnormality, atraumatic  Eyes:    PERRL, conjunctiva/corneas clear, EOM's intact, fundi    benign, both eyes       Ears:    Normal TM's and external ear canals, both ears  Nose:   Nares normal, septum midline, mucosa normal, no drainage    or sinus tenderness  Throat:   Lips, mucosa, and tongue normal; teeth and gums normal  Neck:   Supple, symmetrical, trachea midline, no adenopathy;       thyroid:  No enlargement/tenderness/nodules; no carotid   bruit or JVD  Back:     Symmetric, no  curvature, ROM normal, no CVA tenderness  Lungs:     Clear to auscultation bilaterally, respirations unlabored  Chest wall:    No tenderness or deformity  Heart:    Regular rate and rhythm, S1 and S2 normal, no murmur, rub   or gallop  Abdomen:     Soft, non-tender, bowel sounds active all four quadrants,    no masses, no organomegaly  Genitalia:    Not examined  Rectal:    Not examined  Extremities:   Extremities normal, atraumatic, no cyanosis or edema  Pulses:   2+ and symmetric all extremities  Skin:   Skin color, texture, turgor normal, no rashes or lesions  Lymph nodes:   Cervical, supraclavicular, and axillary nodes normal  Neurologic:   CNII-XII intact. Normal strength, sensation and reflexes      throughout     Musculoskeletal:  ROM .  Specifically, right shoulder has marked impingement.  Cuff irritation.  She lacks a good 25% motion.  I can't tell if this is adhesive capsulitis or pain mediated, probably a little of each.  A lot of give way weakness.  No instability.  Biceps intact.  Neurovascularly intact distally.  Her right elbow incision is well healed.  She has good comfort, good strength and full motion.  Opposite left shoulder has full motion, good strength and not a lot of cuff irritation-, Ligaments irritable,    Imaging Review Three view x-ray of the right shoulder shows a Type II acromion.  Reasonable subacromial space.  AC joint a little narrowing.  Glenohumeral joint and subacromial space look good.MRI dated from October 22, 2015 reveals rotator cuff tendinopathy in the supraspinatus.  Mild AC osteoarthritis.  Nothing more noted ASSESSMENT: Past Medical History:  Diagnosis Date  . Allergy   . Anemia   . Arthritis    knees - no meds  . Asthma   . Colon polyps   . GERD (gastroesophageal reflux disease) 10/12/10   EGD, positive H. pylori  . HSV infection    History  . Hyperlipidemia    ? no meds - diet controlled  . Post-operative nausea and vomiting   . Seasonal  allergies     PLAN: Plan for right RIGHT SHOULDER ARTHROSCOPY, DEBRDIEMENT WITH SUBACROMIAL DECOMPRESSION, DISTAL CLAVICLE EXCISION, ROTATOR CUFF REPAIR AND BICEP TENODESIS  The procedure,  risks, and benefits of total knee arthroplasty were presented and reviewed. The risks including but not limited to infection, blood clots, vascular and nerve injury, stiffness,  among others were discussed. The patient acknowledged the explanation, agreed to proceed.   Trella Thurmond B 01/03/2016, 2:05 PM

## 2016-01-07 ENCOUNTER — Other Ambulatory Visit: Payer: Self-pay

## 2016-01-07 DIAGNOSIS — D708 Other neutropenia: Secondary | ICD-10-CM

## 2016-01-08 ENCOUNTER — Encounter (HOSPITAL_BASED_OUTPATIENT_CLINIC_OR_DEPARTMENT_OTHER): Payer: Self-pay

## 2016-01-08 ENCOUNTER — Ambulatory Visit (HOSPITAL_BASED_OUTPATIENT_CLINIC_OR_DEPARTMENT_OTHER): Admit: 2016-01-08 | Payer: 59 | Admitting: Orthopedic Surgery

## 2016-01-08 SURGERY — SHOULDER ARTHROSCOPY WITH SUBACROMIAL DECOMPRESSION, ROTATOR CUFF REPAIR AND BICEP TENDON REPAIR
Anesthesia: General | Site: Shoulder | Laterality: Right

## 2016-01-09 ENCOUNTER — Inpatient Hospital Stay: Payer: 59

## 2016-01-09 ENCOUNTER — Inpatient Hospital Stay: Payer: 59 | Admitting: Internal Medicine

## 2016-02-03 ENCOUNTER — Ambulatory Visit: Payer: 59 | Attending: Orthopedic Surgery | Admitting: Physical Therapy

## 2016-02-03 ENCOUNTER — Encounter: Payer: Self-pay | Admitting: Physical Therapy

## 2016-02-03 DIAGNOSIS — G8929 Other chronic pain: Secondary | ICD-10-CM | POA: Insufficient documentation

## 2016-02-03 DIAGNOSIS — M25511 Pain in right shoulder: Secondary | ICD-10-CM | POA: Diagnosis present

## 2016-02-03 DIAGNOSIS — M25611 Stiffness of right shoulder, not elsewhere classified: Secondary | ICD-10-CM | POA: Diagnosis present

## 2016-02-03 DIAGNOSIS — M6281 Muscle weakness (generalized): Secondary | ICD-10-CM | POA: Diagnosis not present

## 2016-02-03 NOTE — Therapy (Signed)
[facility] [address] [place], [place], [address] Phone: [phone]   Fax:  [phone]  Physical Therapy Evaluation  Patient Details  Name: Kelly Massey MRN: [id number] Date of Birth: 1965/05/23 Referring Provider: [name] MD  Encounter Date: 02/03/2016      PT End of Session - 02/03/16 1321    Visit Number 1   Number of Visits 15   Date for PT Re-Evaluation 03/30/16   PT Start Time 1000   PT Stop Time 1049   PT Time Calculation (min) 49 min   Activity Tolerance Patient tolerated treatment well;Patient limited by pain   Behavior During Therapy [facility] for tasks assessed/performed      Past Medical History:  Diagnosis Date  . Allergy   . Anemia   . Arthritis    knees - no meds  . Asthma   . Colon polyps   . GERD (gastroesophageal reflux disease) 10/12/10   EGD, positive H. pylori  . HSV infection    History  . Hyperlipidemia    ? no meds - diet controlled  . Post-operative nausea and vomiting   . Seasonal allergies     Past Surgical History:  Procedure Laterality Date  . ABDOMINAL HYSTERECTOMY    . BALLOON DILATION N/A 07/24/2012   Procedure: BALLOON DILATION;  Surgeon: Inda Castle, MD;  Location: [facility];  Service: Endoscopy;  Laterality: N/A;  . BRAVO [place] STUDY N/A 07/24/2012   Procedure: BRAVO [place] STUDY;  Surgeon: Inda Castle, MD;  Location: [facility];  Service: Endoscopy;  Laterality: N/A;  . DILATION AND CURETTAGE OF UTERUS     SAB  . ESOPHAGOGASTRODUODENOSCOPY N/A 07/24/2012   Procedure: ESOPHAGOGASTRODUODENOSCOPY (EGD);  Surgeon: Inda Castle, MD;  Location: [facility];  Service: Endoscopy;  Laterality: N/A;  . OVARIAN CYST REMOVAL  2004   laparotomy -left  . ROBOTIC ASSISTED LAPAROSCOPIC LYSIS OF ADHESION N/A 03/21/2014   Procedure: ROBOTIC ASSISTED LAPAROSCOPIC EXTENSIVE LYSIS OF ADHESION (1 Hour);  Surgeon: Marvene Staff, MD;  Location: [facility];  Service: Gynecology;   Laterality: N/A;  . ROBOTIC ASSISTED SALPINGO OOPHERECTOMY Left 03/21/2014   Procedure:  ROBOTIC ASSISTED LEFT OOPHORECTOMY;  Surgeon: Marvene Staff, MD;  Location: [facility];  Service: Gynecology;  Laterality: Left;  . SHOULDER ARTHROSCOPY Left   . TENNIS ELBOW RELEASE/NIRSCHEL PROCEDURE Right 05/01/2015   Procedure: RIGHT ELBOW DEBRIDEMENT AND TENDON REPAIR;  Surgeon: [name], MD;  Location: [facility];  Service: Orthopedics;  Laterality: Right;  . WISDOM TOOTH EXTRACTION      There were no vitals filed for this visit.       Subjective Assessment - 02/03/16 1312    Subjective Pt is s/p R RTC surgery. She has a history of chronic shoulder and elbow pain that she has been treated for with physical therapy. 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 She does not plan to return to work until her R shoulder/arm are healed (>6 months). Pt lives with her husband and 60 year old daughter who are able to assist her with ADLs. Pt states that she has been icing her shoulder 2x/day and keeping it in the sling except for bathing. States that she has experienced mild neck stiffness post surgery.    Pertinent History s/p RTC repair   Limitations Lifting;House hold activities   Patient Stated Goals pt would like to recover from RTC surgery with normal range/strength  of RUE so she can get back to independence/daily tasks   Currently in Pain? Yes   Pain Score 7    Pain Location Shoulder   Pain Orientation Right   Pain Descriptors / Indicators Constant   Pain Type Surgical pain   Pain Onset 1 to 4 weeks ago   Pain Frequency Constant   Pain Relieving Factors pain medication, ice, hot shower            [facility] PT Assessment - 02/03/16 0001      Assessment   Medical Diagnosis s/p R RTC repair   Referring Provider [name] MD   Hand Dominance Right   Prior Therapy prior PT for elbow/shoulder. First bout of PT since surgery       Objective:  Therapeutic Exercise: Elbow flexion/extension AROM x10. Wrist flexion/extension AROM x10. Forearm supination/pronation x10. Grip exercises with green putty. Pendulum (30 sec x2) emphasis on relaxing shoulder muscles; moving shoulder with body.  Manual tx: PROM to R shoulder to pt tolerance flexion, abduction, ER, IR. PT with preference for IR of R shoulder secondary to sling wear/post surgical positioning. Pt with heavy muscle guarding during PROM; improved ROM after repeated PROM esp in R shoulder flexion.  Ice applied to R UT/R shoulder in sitting following tx.   Pt response for medical necessity: Pt with good tolerance of PT session; she demonstrates considerable R shoulder muscle guarding secondary to increased pain with PROM. Pt also notes increased R lateral elbow/forearm pain with end range AROM elbow extension. She demonstrates increased mobility in R shoulder/elbow by end of PT session.       PT Education - 02/03/16 1320    Education provided Yes   Education Details See pt instructions for detailed HEP program. Encouraged ice for pain relief/decrease inflammation. Emphasized maintaing neutral shoulder at rest.   Person(s) Educated Patient   Methods Explanation;Demonstration;Handout   Comprehension Verbalized understanding;Returned demonstration             PT Long Term Goals - 02/03/16 1338      PT LONG TERM GOAL #1   Title Pt will be able to achieve full R elbow flexion/extension with <2/10 pain to promote return to PLOF   Baseline 10/31: pt with consistent end range pain in elbow extension >7/10   Time 4   Period Weeks   Status New     PT LONG TERM GOAL #2   Title Pt will improve self-perceived QuickDash score to <75% to promote increase in functional mobility so she can perform household tasks.   Baseline 10/31: 95.5%   Time 4   Period Weeks   Status New     PT LONG TERM GOAL #3   Title Pt will increase R shoulder flexion/abduction AROM to >140,  >161 respectively so that she can perform overhead lifting tasks    Baseline 10/31: pt with pain limited PROM R shoulder flexion 86 deg, R shoulder abduction 44 deg   Time 8   Period Weeks   Status New     PT LONG TERM GOAL #4   Title Pt will demonstrate equivalent R UE strength as compared to L UE strength to promote return to PLOF so she can perform work tasks   Baseline 10/31: RUE not tested 2/2 recent surgery, LUE shoulder flexion 4+, abduction 4+, biceps/triceps 5   Time 12   Period Weeks   Status New     PT LONG TERM GOAL #5   Title Pt will report <  2/10 resting pain for R shoulder to promote pain free function so she can sleep through the night   Baseline 10/31: pt with 7/10 resting R shoulder pain; reports difficulty sleeping through the night due to muscle throbbing/pain   Time 4   Period Weeks   Status New            Plan - 02/03/16 1324    Clinical Impression Statement Pt is a pleasant 50 year old female referred for physical therapy due to R RTC repair 2 weeks ago. She has history of chronic R shoulder and elbow pain which she has been seen by PT for. Pt states she has minimal elbow pain at current time; but has noticed a mild increase in R elbow sensitivity with sling wear. Pain: Elbow 0/10 at best, 7/10 at worst. R Shoulder 7/10 constant since surgery. Pt with limited use of pain relievers due to GI issues. Pt icing twice daily for pain relief; with stated relief when she takes a hot shower. ROM: AROM LUE Flexion 140 deg, abduction 161 deg. PROM RUE: flexion 86 deg (following repeated PROM/mobilization; pt with initial severe discomfort with <15 deg), abduction 44 deg, IR comfortable resting on stomach, ER - 15 deg (approx.) before onset of moderate-severe discomfort. Pt with moderate-severe muscle guarding with PROM of RUE; difficulty with relaxing and experiences "pinching" with resumption of neutral shoulder. Elbow extension/flexion WFL but with lateral elbow pain radiating  down forearm with end range extension. Grip Strength: L 43.2, 33.4. R 25.2, 29.4. Pt with good wound healing, currently no steri strips, 3 surgical wounds with no drainage. Outcome Measures: QuickDash 95.5% Pt works at a tobacco factory and has varying work demands, but most recent job involves lifting/shoveling/pushing/pulling. Pt with expected recovery time > 6 months before return to work. She will benefit from skilled physical therapy to ensure proper management of RTC healing/mobilization and strengthening program.   Rehab Potential Good   Clinical Impairments Affecting Rehab Potential Positive: Age. Motivation. Active lifestyle.   PT Frequency 2x / week   PT Duration 8 weeks   PT Treatment/Interventions ADLs/Self Care Home Management;Biofeedback;Cryotherapy;Electrical Stimulation;Moist Heat;Functional mobility training;Therapeutic exercise;Patient/family education;Manual techniques;Scar mobilization;Passive range of motion   PT Next Visit Plan Address mobility/strength deficits per MD protocol. Emphasize early PROM motion.   PT Home Exercise Plan see pt instructions for detailed HEP program   Consulted and Agree with Plan of Care Patient      Patient will benefit from skilled therapeutic intervention in order to improve the following deficits and impairments:  Decreased activity tolerance, Decreased mobility, Decreased range of motion, Decreased scar mobility, Hypomobility, Impaired flexibility, Impaired UE functional use, Pain, Improper body mechanics  Visit Diagnosis: Muscle weakness (generalized)  Chronic right shoulder pain  Stiffness of right shoulder, not elsewhere classified     Problem List Patient Active Problem List   Diagnosis Date Noted  . Neutropenia ([place]) 10/09/2015  . Health care maintenance 11/21/2014  . History of colonic polyps 11/21/2014  . Dysphagia 11/19/2014  . Trochanteric bursitis of left hip 10/08/2014  . URI (upper respiratory infection) 02/10/2014  .  Pelvic pain in female 12/25/2013  . Personal history of ovarian cyst 12/25/2013  . Change in bowel movement 11/11/2013  . breast tenderness 09/29/2013  . Dysphagia, unspecified(787.20) 06/12/2012  . Barrett's esophagus 06/12/2012  . Environmental allergies 02/13/2012  . Enlarged thyroid 02/13/2012  . GERD (gastroesophageal reflux disease) 02/08/2012  . Leukopenia 02/08/2012  . Fatigue 02/08/2012  . Hypercholesterolemia 02/08/2012   Legrand Como  Vernona Rieger, PT, DPT # [id number] Derrill Memo, SPT 02/03/2016, 2:43 PM  [facility] [address] [place], [place], [address] Phone: [phone]   Fax:  [phone]  Name: Kelly Massey MRN: [id number] Date of Birth: 01-16-1966

## 2016-02-05 ENCOUNTER — Ambulatory Visit: Payer: 59 | Attending: Orthopedic Surgery | Admitting: Physical Therapy

## 2016-02-05 ENCOUNTER — Encounter: Payer: Self-pay | Admitting: Physical Therapy

## 2016-02-05 DIAGNOSIS — M25511 Pain in right shoulder: Secondary | ICD-10-CM | POA: Insufficient documentation

## 2016-02-05 DIAGNOSIS — G8929 Other chronic pain: Secondary | ICD-10-CM | POA: Insufficient documentation

## 2016-02-05 DIAGNOSIS — M6281 Muscle weakness (generalized): Secondary | ICD-10-CM | POA: Diagnosis present

## 2016-02-05 DIAGNOSIS — M25611 Stiffness of right shoulder, not elsewhere classified: Secondary | ICD-10-CM | POA: Diagnosis present

## 2016-02-05 DIAGNOSIS — M25621 Stiffness of right elbow, not elsewhere classified: Secondary | ICD-10-CM | POA: Diagnosis present

## 2016-02-05 NOTE — Therapy (Signed)
[facility] [address] [place], [place], [address] Phone: [phone]   Fax:  [phone]  Physical Therapy Treatment  Patient Details  Name: Kelly Massey MRN: [id number] Date of Birth: Feb 18, 1966 Referring Provider: [name] MD  Encounter Date: 02/05/2016      PT End of Session - 02/05/16 1633    Visit Number 2   Number of Visits 15   Date for PT Re-Evaluation 03/30/16   PT Start Time [id number]   PT Stop Time 1602   PT Time Calculation (min) 49 min   Activity Tolerance Patient tolerated treatment well;Patient limited by pain   Behavior During Therapy [facility] for tasks assessed/performed      Past Medical History:  Diagnosis Date  . Allergy   . Anemia   . Arthritis    knees - no meds  . Asthma   . Colon polyps   . GERD (gastroesophageal reflux disease) 10/12/10   EGD, positive H. pylori  . HSV infection    History  . Hyperlipidemia    ? no meds - diet controlled  . Post-operative nausea and vomiting   . Seasonal allergies     Past Surgical History:  Procedure Laterality Date  . ABDOMINAL HYSTERECTOMY    . BALLOON DILATION N/A 07/24/2012   Procedure: BALLOON DILATION;  Surgeon: Inda Castle, MD;  Location: [facility];  Service: Endoscopy;  Laterality: N/A;  . BRAVO [place] STUDY N/A 07/24/2012   Procedure: BRAVO [place] STUDY;  Surgeon: Inda Castle, MD;  Location: [facility];  Service: Endoscopy;  Laterality: N/A;  . DILATION AND CURETTAGE OF UTERUS     SAB  . ESOPHAGOGASTRODUODENOSCOPY N/A 07/24/2012   Procedure: ESOPHAGOGASTRODUODENOSCOPY (EGD);  Surgeon: Inda Castle, MD;  Location: [facility];  Service: Endoscopy;  Laterality: N/A;  . OVARIAN CYST REMOVAL  2004   laparotomy -left  . ROBOTIC ASSISTED LAPAROSCOPIC LYSIS OF ADHESION N/A 03/21/2014   Procedure: ROBOTIC ASSISTED LAPAROSCOPIC EXTENSIVE LYSIS OF ADHESION (1 Hour);  Surgeon: Marvene Staff, MD;  Location: [facility];  Service: Gynecology;   Laterality: N/A;  . ROBOTIC ASSISTED SALPINGO OOPHERECTOMY Left 03/21/2014   Procedure:  ROBOTIC ASSISTED LEFT OOPHORECTOMY;  Surgeon: Marvene Staff, MD;  Location: [facility];  Service: Gynecology;  Laterality: Left;  . SHOULDER ARTHROSCOPY Left   . TENNIS ELBOW RELEASE/NIRSCHEL PROCEDURE Right 05/01/2015   Procedure: RIGHT ELBOW DEBRIDEMENT AND TENDON REPAIR;  Surgeon: [name], MD;  Location: [facility];  Service: Orthopedics;  Laterality: Right;  . WISDOM TOOTH EXTRACTION      There were no vitals filed for this visit.      Subjective Assessment - 02/05/16 1632    Subjective Pt states that she has been performing HEP program regularly; denies pain in shoulder or elbow with issued HEP.    Pertinent History s/p RTC repair   Limitations Lifting;House hold activities   Patient Stated Goals pt would like to recover from RTC surgery with normal range/strength of RUE so she can get back to independence/daily tasks   Currently in Pain? No/denies      Objective:  Therapeutic Exercise: Elbow flexion/extension AROM. Wrist flexion/extension AROM. Forearm supination/pronation AROM.  Reviewed HEP.    Manual tx: Emphasis on passive range of motion with oscillation at end range to promote increased range. PROM for shoulder flexion (pt experiences x2 muscle spasms in R shoulder biceps with eccentric lowering; heavily guarded and difficulty time relaxing during shoulder mobilization),  abduction and ER. Pt able to achieve ER past neutral this session.  Pt response for medical necessity: Pt with moderate pain during manual tx; she experiences x2 muscle spasm with eccentric flexion which lesson with repeated ROM. Consistent improvement of ROM this session but pt remains heavily guarded. Will benefit from continued emphasis on early motion to avoid shoulder restriction longterm.     PT Long Term Goals - 02/03/16 1338      PT LONG TERM GOAL #1   Title Pt will be able to achieve  full R elbow flexion/extension with <2/10 pain to promote return to PLOF   Baseline 10/31: pt with consistent end range pain in elbow extension >7/10   Time 4   Period Weeks   Status New     PT LONG TERM GOAL #2   Title Pt will improve self-perceived QuickDash score to <75% to promote increase in functional mobility so she can perform household tasks.   Baseline 10/31: 95.5%   Time 4   Period Weeks   Status New     PT LONG TERM GOAL #3   Title Pt will increase R shoulder flexion/abduction AROM to >140, >161 respectively so that she can perform overhead lifting tasks    Baseline 10/31: pt with pain limited PROM R shoulder flexion 86 deg, R shoulder abduction 44 deg   Time 8   Period Weeks   Status New     PT LONG TERM GOAL #4   Title Pt will demonstrate equivalent R UE strength as compared to L UE strength to promote return to PLOF so she can perform work tasks   Baseline 10/31: RUE not tested 2/2 recent surgery, LUE shoulder flexion 4+, abduction 4+, biceps/triceps 5   Time 12   Period Weeks   Status New     PT LONG TERM GOAL #5   Title Pt will report <2/10 resting pain for R shoulder to promote pain free function so she can sleep through the night   Baseline 10/31: pt with 7/10 resting R shoulder pain; reports difficulty sleeping through the night due to muscle throbbing/pain   Time 4   Period Weeks   Status New          Plan - 02/05/16 1633    Clinical Impression Statement Pt is heavily guarded during R shoulder PROM; experiences muscle spasm with eccentric flexion 2x during treatment session. She is able to tolerate 94 deg shoulder flex, 92 shoulder abd, 4 deg ER following repeated PROM to end range. No c/o in shoulder/elbow with active elbow/wrist ROM.   Rehab Potential Good   Clinical Impairments Affecting Rehab Potential Positive: Age. Motivation. Active lifestyle.   PT Frequency 2x / week   PT Duration 8 weeks   PT Treatment/Interventions ADLs/Self Care Home  Management;Biofeedback;Cryotherapy;Electrical Stimulation;Moist Heat;Functional mobility training;Therapeutic exercise;Patient/family education;Manual techniques;Scar mobilization;Passive range of motion   PT Next Visit Plan Address mobility/strength deficits per MD protocol. Emphasize early PROM motion.   PT Home Exercise Plan see pt instructions for detailed HEP program   Consulted and Agree with Plan of Care Patient      Patient will benefit from skilled therapeutic intervention in order to improve the following deficits and impairments:  Decreased activity tolerance, Decreased mobility, Decreased range of motion, Decreased scar mobility, Hypomobility, Impaired flexibility, Impaired UE functional use, Pain, Improper body mechanics  Visit Diagnosis: Muscle weakness (generalized)  Chronic right shoulder pain  Stiffness of right shoulder, not elsewhere classified  Joint stiffness of elbow, right  Problem List Patient Active Problem List   Diagnosis Date Noted  . Neutropenia ([place]) 10/09/2015  . Health care maintenance 11/21/2014  . History of colonic polyps 11/21/2014  . Dysphagia 11/19/2014  . Trochanteric bursitis of left hip 10/08/2014  . URI (upper respiratory infection) 02/10/2014  . Pelvic pain in female 12/25/2013  . Personal history of ovarian cyst 12/25/2013  . Change in bowel movement 11/11/2013  . breast tenderness 09/29/2013  . Dysphagia, unspecified(787.20) 06/12/2012  . Barrett's esophagus 06/12/2012  . Environmental allergies 02/13/2012  . Enlarged thyroid 02/13/2012  . GERD (gastroesophageal reflux disease) 02/08/2012  . Leukopenia 02/08/2012  . Fatigue 02/08/2012  . Hypercholesterolemia 02/08/2012   Pura Spice, PT, DPT # [id number] Mickel Baas Henli Hey SPT 02/05/2016, 4:36 PM  [facility] [address] [place], [place], [address] Phone: [phone]   Fax:  [phone]  Name: Kelly Massey MRN:  [id number] Date of Birth: 1965-10-24

## 2016-02-06 ENCOUNTER — Ambulatory Visit: Payer: 59 | Admitting: Internal Medicine

## 2016-02-10 ENCOUNTER — Ambulatory Visit: Payer: 59 | Admitting: Physical Therapy

## 2016-02-10 ENCOUNTER — Encounter: Payer: Self-pay | Admitting: Physical Therapy

## 2016-02-10 ENCOUNTER — Encounter: Payer: 59 | Admitting: Physical Therapy

## 2016-02-10 DIAGNOSIS — M25511 Pain in right shoulder: Secondary | ICD-10-CM

## 2016-02-10 DIAGNOSIS — M25611 Stiffness of right shoulder, not elsewhere classified: Secondary | ICD-10-CM

## 2016-02-10 DIAGNOSIS — M6281 Muscle weakness (generalized): Secondary | ICD-10-CM

## 2016-02-10 DIAGNOSIS — G8929 Other chronic pain: Secondary | ICD-10-CM

## 2016-02-10 NOTE — Therapy (Signed)
[facility] [address]. [place], [place], [address] Phone: [phone]   Fax:  [phone]  Physical Therapy Treatment  Patient Details  Name: Kelly Massey MRN: [id number] Date of Birth: 1965-06-21 Referring Provider: [name] MD  Encounter Date: 02/10/2016      PT End of Session - 02/10/16 1053    Visit Number 3   Number of Visits 15   Date for PT Re-Evaluation 03/30/16   PT Start Time 1000   PT Stop Time 1053   PT Time Calculation (min) 53 min   Activity Tolerance Patient tolerated treatment well;Patient limited by pain   Behavior During Therapy [facility] for tasks assessed/performed      Past Medical History:  Diagnosis Date  . Allergy   . Anemia   . Arthritis    knees - no meds  . Asthma   . Colon polyps   . GERD (gastroesophageal reflux disease) 10/12/10   EGD, positive H. pylori  . HSV infection    History  . Hyperlipidemia    ? no meds - diet controlled  . Post-operative nausea and vomiting   . Seasonal allergies     Past Surgical History:  Procedure Laterality Date  . ABDOMINAL HYSTERECTOMY    . BALLOON DILATION N/A 07/24/2012   Procedure: BALLOON DILATION;  Surgeon: Inda Castle, MD;  Location: [facility];  Service: Endoscopy;  Laterality: N/A;  . BRAVO [place] STUDY N/A 07/24/2012   Procedure: BRAVO [place] STUDY;  Surgeon: Inda Castle, MD;  Location: [facility];  Service: Endoscopy;  Laterality: N/A;  . DILATION AND CURETTAGE OF UTERUS     SAB  . ESOPHAGOGASTRODUODENOSCOPY N/A 07/24/2012   Procedure: ESOPHAGOGASTRODUODENOSCOPY (EGD);  Surgeon: Inda Castle, MD;  Location: [facility];  Service: Endoscopy;  Laterality: N/A;  . OVARIAN CYST REMOVAL  2004   laparotomy -left  . ROBOTIC ASSISTED LAPAROSCOPIC LYSIS OF ADHESION N/A 03/21/2014   Procedure: ROBOTIC ASSISTED LAPAROSCOPIC EXTENSIVE LYSIS OF ADHESION (1 Hour);  Surgeon: Marvene Staff, MD;  Location: [facility];  Service: Gynecology;   Laterality: N/A;  . ROBOTIC ASSISTED SALPINGO OOPHERECTOMY Left 03/21/2014   Procedure:  ROBOTIC ASSISTED LEFT OOPHORECTOMY;  Surgeon: Marvene Staff, MD;  Location: [facility];  Service: Gynecology;  Laterality: Left;  . SHOULDER ARTHROSCOPY Left   . TENNIS ELBOW RELEASE/NIRSCHEL PROCEDURE Right 05/01/2015   Procedure: RIGHT ELBOW DEBRIDEMENT AND TENDON REPAIR;  Surgeon: [name], MD;  Location: [facility];  Service: Orthopedics;  Laterality: Right;  . WISDOM TOOTH EXTRACTION      There were no vitals filed for this visit.      Subjective Assessment - 02/10/16 1052    Subjective Pt states that she had a rough night the night before last with onset of moderate-severe shoulder pain. States that pain abated by the end of the day to usual/constant 5/10. She tried icing/pendulums/hot shower etc with minimal relief. Pt unsure what could have caused increase in shoulder pain. States she is doing better today.    Pertinent History s/p RTC repair   Limitations Lifting;House hold activities   Patient Stated Goals pt would like to recover from RTC surgery with normal range/strength of RUE so she can get back to independence/daily tasks   Currently in Pain? No/denies   Pain Score 5    Pain Location Shoulder   Pain Orientation Right   Pain Descriptors / Indicators Constant;Aching   Pain Type Surgical pain  Pain Onset 1 to 4 weeks ago   Pain Frequency Constant     Objective:   Therapeutic Exercise: Elbow flexion/extension, wrist flexion/extension, forearm supination/pronation AROM; pt with full range and no c/o.   Manual tx: Emphasis on passive range of motion with oscillation at end range to promote increased range. PROM for shoulder flexion (pt experiences x3 muscle spasms in R shoulder biceps with eccentric lowering; heavily guarded and difficulty time relaxing during shoulder mobilization), abduction and ER. Pt with mild increase in tolerable ROM this session for all  planes: flexion/abd/ER. Grade II mobilizations R shoulder inferior glide/posterior glide (3 bouts, 1 minute).      Pt response for medical necessity: Pt with moderate pain during manual tx; she experiences x3 muscle spasm with eccentric flexion. Consistent improvement of ROM this session but pt remains heavily guarded especially with ER. Will benefit from continued emphasis on early motion to avoid shoulder restriction long term.      PT Long Term Goals - 02/03/16 1338      PT LONG TERM GOAL #1   Title Pt will be able to achieve full R elbow flexion/extension with <2/10 pain to promote return to PLOF   Baseline 10/31: pt with consistent end range pain in elbow extension >7/10   Time 4   Period Weeks   Status New     PT LONG TERM GOAL #2   Title Pt will improve self-perceived QuickDash score to <75% to promote increase in functional mobility so she can perform household tasks.   Baseline 10/31: 95.5%   Time 4   Period Weeks   Status New     PT LONG TERM GOAL #3   Title Pt will increase R shoulder flexion/abduction AROM to >140, >161 respectively so that she can perform overhead lifting tasks    Baseline 10/31: pt with pain limited PROM R shoulder flexion 86 deg, R shoulder abduction 44 deg   Time 8   Period Weeks   Status New     PT LONG TERM GOAL #4   Title Pt will demonstrate equivalent R UE strength as compared to L UE strength to promote return to PLOF so she can perform work tasks   Baseline 10/31: RUE not tested 2/2 recent surgery, LUE shoulder flexion 4+, abduction 4+, biceps/triceps 5   Time 12   Period Weeks   Status New     PT LONG TERM GOAL #5   Title Pt will report <2/10 resting pain for R shoulder to promote pain free function so she can sleep through the night   Baseline 10/31: pt with 7/10 resting R shoulder pain; reports difficulty sleeping through the night due to muscle throbbing/pain   Time 4   Period Weeks   Status New           Plan - 02/10/16 1054     Clinical Impression Statement Pt is progressing with pain tolerable R shoulder PROM; achieves 101 deg shoulder flexion, 109 deg shoulder abd, 8 deg shoulder ER this session. Pt with most difficulty/pain with lowering from flexed position; she has a difficult time relaxing the shoulder/arm and experiences muscle spasm. Pt comfortable with current HEP program. Will progress toward AAROM in the coming week.    Rehab Potential Good   Clinical Impairments Affecting Rehab Potential Positive: Age. Motivation. Active lifestyle.   PT Frequency 2x / week   PT Duration 8 weeks   PT Treatment/Interventions ADLs/Self Care Home Management;Biofeedback;Cryotherapy;Electrical Stimulation;Moist Heat;Functional mobility training;Therapeutic exercise;Patient/family education;Manual techniques;Scar  mobilization;Passive range of motion   PT Next Visit Plan Address mobility/strength deficits per MD protocol. Emphasize early PROM motion.   PT Home Exercise Plan see pt instructions for detailed HEP program   Consulted and Agree with Plan of Care Patient      Patient will benefit from skilled therapeutic intervention in order to improve the following deficits and impairments:  Decreased activity tolerance, Decreased mobility, Decreased range of motion, Decreased scar mobility, Hypomobility, Impaired flexibility, Impaired UE functional use, Pain, Improper body mechanics  Visit Diagnosis: Muscle weakness (generalized)  Chronic right shoulder pain  Stiffness of right shoulder, not elsewhere classified     Problem List Patient Active Problem List   Diagnosis Date Noted  . Neutropenia ([place]) 10/09/2015  . Health care maintenance 11/21/2014  . History of colonic polyps 11/21/2014  . Dysphagia 11/19/2014  . Trochanteric bursitis of left hip 10/08/2014  . URI (upper respiratory infection) 02/10/2014  . Pelvic pain in female 12/25/2013  . Personal history of ovarian cyst 12/25/2013  . Change in bowel movement  11/11/2013  . breast tenderness 09/29/2013  . Dysphagia, unspecified(787.20) 06/12/2012  . Barrett's esophagus 06/12/2012  . Environmental allergies 02/13/2012  . Enlarged thyroid 02/13/2012  . GERD (gastroesophageal reflux disease) 02/08/2012  . Leukopenia 02/08/2012  . Fatigue 02/08/2012  . Hypercholesterolemia 02/08/2012   Pura Spice, PT, DPT # [phone] Mickel Baas Joniqua Sidle SPT 02/10/2016, 11:04 AM  [facility] [address] [place], [place], [address] Phone: [phone]   Fax:  [phone]  Name: CHADWICK YUEN MRN: [id number] Date of Birth: [birth year]

## 2016-02-12 ENCOUNTER — Ambulatory Visit: Payer: 59 | Admitting: Physical Therapy

## 2016-02-12 ENCOUNTER — Encounter: Payer: Self-pay | Admitting: Physical Therapy

## 2016-02-12 DIAGNOSIS — M6281 Muscle weakness (generalized): Secondary | ICD-10-CM

## 2016-02-12 DIAGNOSIS — M25611 Stiffness of right shoulder, not elsewhere classified: Secondary | ICD-10-CM

## 2016-02-12 DIAGNOSIS — G8929 Other chronic pain: Secondary | ICD-10-CM

## 2016-02-12 DIAGNOSIS — M25511 Pain in right shoulder: Secondary | ICD-10-CM

## 2016-02-12 NOTE — Therapy (Signed)
[facility] [address] [place], [place], [address] Phone: [phone]   Fax:  [phone]  Physical Therapy Treatment  Patient Details  Name: Kelly Massey MRN: [id number] Date of Birth: 1965-05-02 Referring Provider: [name] MD  Encounter Date: 02/12/2016      PT End of Session - 02/12/16 1250    Visit Number 4   Number of Visits 15   Date for PT Re-Evaluation 03/30/16   PT Start Time 1030   PT Stop Time 1119   PT Time Calculation (min) 49 min   Activity Tolerance Patient tolerated treatment well;Patient limited by pain   Behavior During Therapy [facility] for tasks assessed/performed      Past Medical History:  Diagnosis Date  . Allergy   . Anemia   . Arthritis    knees - no meds  . Asthma   . Colon polyps   . GERD (gastroesophageal reflux disease) 10/12/10   EGD, positive H. pylori  . HSV infection    History  . Hyperlipidemia    ? no meds - diet controlled  . Post-operative nausea and vomiting   . Seasonal allergies     Past Surgical History:  Procedure Laterality Date  . ABDOMINAL HYSTERECTOMY    . BALLOON DILATION N/A 07/24/2012   Procedure: BALLOON DILATION;  Surgeon: Inda Castle, MD;  Location: [facility];  Service: Endoscopy;  Laterality: N/A;  . BRAVO [place] STUDY N/A 07/24/2012   Procedure: BRAVO [place] STUDY;  Surgeon: Inda Castle, MD;  Location: [facility];  Service: Endoscopy;  Laterality: N/A;  . DILATION AND CURETTAGE OF UTERUS     SAB  . ESOPHAGOGASTRODUODENOSCOPY N/A 07/24/2012   Procedure: ESOPHAGOGASTRODUODENOSCOPY (EGD);  Surgeon: Inda Castle, MD;  Location: [facility];  Service: Endoscopy;  Laterality: N/A;  . OVARIAN CYST REMOVAL  2004   laparotomy -left  . ROBOTIC ASSISTED LAPAROSCOPIC LYSIS OF ADHESION N/A 03/21/2014   Procedure: ROBOTIC ASSISTED LAPAROSCOPIC EXTENSIVE LYSIS OF ADHESION (1 Hour);  Surgeon: Marvene Staff, MD;  Location: [facility];  Service: Gynecology;   Laterality: N/A;  . ROBOTIC ASSISTED SALPINGO OOPHERECTOMY Left 03/21/2014   Procedure:  ROBOTIC ASSISTED LEFT OOPHORECTOMY;  Surgeon: Marvene Staff, MD;  Location: [facility];  Service: Gynecology;  Laterality: Left;  . SHOULDER ARTHROSCOPY Left   . TENNIS ELBOW RELEASE/NIRSCHEL PROCEDURE Right 05/01/2015   Procedure: RIGHT ELBOW DEBRIDEMENT AND TENDON REPAIR;  Surgeon: [name], MD;  Location: [facility];  Service: Orthopedics;  Laterality: Right;  . WISDOM TOOTH EXTRACTION      There were no vitals filed for this visit.      Subjective Assessment - 02/12/16 1245    Subjective Pt states that she has been experiencing some pain in her R forearm/elbow but is unsure why. States she has periods of time when the shoulder does not hurt at all. Continues to use hot shower and ice to relieve feelings of tightness/pain.   Pertinent History s/p RTC repair   Limitations Lifting;House hold activities   Patient Stated Goals pt would like to recover from RTC surgery with normal range/strength of RUE so she can get back to independence/daily tasks   Currently in Pain? No/denies     Objective:    Therapeutic Exercise: Pulleys for R shoulder AAROM/PROM. Emphasis on passive R shoulder mobility in flexion/scaption/abduction 15 minutes total. Pt demonstrates increased ease of R shoulder mobility when performing pulleys versus therapist assisted PROM.  Manual tx: Emphasis on passive range of motion with oscillation at end range to promote increased range. PROM for shoulder flexion (heavily guarded and difficulty time relaxing during shoulder mobilization), abduction and ER. Pt with mild increase in tolerable ROM this session for all planes: flexion/abd/ER. Forearm supination/pronation PROM x20.   Pt response for medical necessity: Pt demonstrates improved ease of R shoulder ROM with pulleys in flexion, scaption, abduction. Minimal improvement in pt tolerance to PROM in flex,abd, ER.  Pt with onset of R forearm pain with ER; decreased with forearm supination/pronation PROM x20.       PT Education - 02/12/16 1249    Education provided Yes   Education Details see pt instructions for detailed HEP: shoulder pulleys.   Person(s) Educated Patient   Methods Explanation;Demonstration;Handout   Comprehension Verbalized understanding;Returned demonstration             PT Long Term Goals - 02/03/16 1338      PT LONG TERM GOAL #1   Title Pt will be able to achieve full R elbow flexion/extension with <2/10 pain to promote return to PLOF   Baseline 10/31: pt with consistent end range pain in elbow extension >7/10   Time 4   Period Weeks   Status New     PT LONG TERM GOAL #2   Title Pt will improve self-perceived QuickDash score to <75% to promote increase in functional mobility so she can perform household tasks.   Baseline 10/31: 95.5%   Time 4   Period Weeks   Status New     PT LONG TERM GOAL #3   Title Pt will increase R shoulder flexion/abduction AROM to >140, >161 respectively so that she can perform overhead lifting tasks    Baseline 10/31: pt with pain limited PROM R shoulder flexion 86 deg, R shoulder abduction 44 deg   Time 8   Period Weeks   Status New     PT LONG TERM GOAL #4   Title Pt will demonstrate equivalent R UE strength as compared to L UE strength to promote return to PLOF so she can perform work tasks   Baseline 10/31: RUE not tested 2/2 recent surgery, LUE shoulder flexion 4+, abduction 4+, biceps/triceps 5   Time 12   Period Weeks   Status New     PT LONG TERM GOAL #5   Title Pt will report <2/10 resting pain for R shoulder to promote pain free function so she can sleep through the night   Baseline 10/31: pt with 7/10 resting R shoulder pain; reports difficulty sleeping through the night due to muscle throbbing/pain   Time 4   Period Weeks   Status New               Plan - 02/12/16 1250    Clinical Impression Statement Pt  demonstrates increase in tolerabe R shoulder ROM with AAROM using pulleys. Pt able to achieve 106 shoulder flexion, 134 shoulder abd with pulleys. Minimal improvement in R shoulder PROM due to guarding. Pt with initial resistance to shoulder ER PROM secondary to R forearm pain; eased with PROM supination/pronation of forearm; pt able to achieve 18 deg ER of R shoulder passively.    Rehab Potential Good   Clinical Impairments Affecting Rehab Potential Positive: Age. Motivation. Active lifestyle.   PT Frequency 2x / week   PT Duration 8 weeks   PT Treatment/Interventions ADLs/Self Care Home Management;Biofeedback;Cryotherapy;Electrical Stimulation;Moist Heat;Functional mobility training;Therapeutic exercise;Patient/family education;Manual techniques;Scar mobilization;Passive range of motion  PT Next Visit Plan Address mobility/strength deficits per MD protocol.   PT Home Exercise Plan see pt instructions for detailed HEP program   Consulted and Agree with Plan of Care Patient      Patient will benefit from skilled therapeutic intervention in order to improve the following deficits and impairments:  Decreased activity tolerance, Decreased mobility, Decreased range of motion, Decreased scar mobility, Hypomobility, Impaired flexibility, Impaired UE functional use, Pain, Improper body mechanics  Visit Diagnosis: Muscle weakness (generalized)  Chronic right shoulder pain  Stiffness of right shoulder, not elsewhere classified     Problem List Patient Active Problem List   Diagnosis Date Noted  . Neutropenia ([place]) 10/09/2015  . Health care maintenance 11/21/2014  . History of colonic polyps 11/21/2014  . Dysphagia 11/19/2014  . Trochanteric bursitis of left hip 10/08/2014  . URI (upper respiratory infection) 02/10/2014  . Pelvic pain in female 12/25/2013  . Personal history of ovarian cyst 12/25/2013  . Change in bowel movement 11/11/2013  . breast tenderness 09/29/2013  . Dysphagia,  unspecified(787.20) 06/12/2012  . Barrett's esophagus 06/12/2012  . Environmental allergies 02/13/2012  . Enlarged thyroid 02/13/2012  . GERD (gastroesophageal reflux disease) 02/08/2012  . Leukopenia 02/08/2012  . Fatigue 02/08/2012  . Hypercholesterolemia 02/08/2012   Pura Spice, PT, DPT # [phone] Mickel Baas Jayvier Burgher SPT 02/12/2016, 3:02 PM  [facility] [address] [place], [place], [address] Phone: [phone]   Fax:  [phone]  Name: SHARAN SIRAGUSA MRN: [id number] Date of Birth: January 08, 1966

## 2016-02-17 ENCOUNTER — Encounter: Payer: Self-pay | Admitting: Physical Therapy

## 2016-02-17 ENCOUNTER — Other Ambulatory Visit: Payer: Self-pay | Admitting: Internal Medicine

## 2016-02-17 ENCOUNTER — Ambulatory Visit: Payer: 59 | Admitting: Physical Therapy

## 2016-02-17 DIAGNOSIS — M6281 Muscle weakness (generalized): Secondary | ICD-10-CM | POA: Diagnosis not present

## 2016-02-17 DIAGNOSIS — M25611 Stiffness of right shoulder, not elsewhere classified: Secondary | ICD-10-CM

## 2016-02-17 DIAGNOSIS — M25621 Stiffness of right elbow, not elsewhere classified: Secondary | ICD-10-CM

## 2016-02-17 DIAGNOSIS — G8929 Other chronic pain: Secondary | ICD-10-CM

## 2016-02-17 DIAGNOSIS — M25511 Pain in right shoulder: Secondary | ICD-10-CM

## 2016-02-17 NOTE — Therapy (Signed)
[facility] [address]. [place], [place], [address] Phone: [phone]   Fax:  [phone]  Physical Therapy Treatment  Patient Details  Name: Kelly Massey MRN: [id number] Date of Birth: [birth year] Referring Provider: [name] MD  Encounter Date: 02/17/2016      PT End of Session - 02/17/16 1535    Visit Number 5   Number of Visits 15   Date for PT Re-Evaluation 03/30/16   PT Start Time 1028   PT Stop Time 1121   PT Time Calculation (min) 53 min   Activity Tolerance Patient tolerated treatment well;Patient limited by pain   Behavior During Therapy [facility] for tasks assessed/performed      Past Medical History:  Diagnosis Date  . Allergy   . Anemia   . Arthritis    knees - no meds  . Asthma   . Colon polyps   . GERD (gastroesophageal reflux disease) 10/12/10   EGD, positive H. pylori  . HSV infection    History  . Hyperlipidemia    ? no meds - diet controlled  . Post-operative nausea and vomiting   . Seasonal allergies     Past Surgical History:  Procedure Laterality Date  . ABDOMINAL HYSTERECTOMY    . BALLOON DILATION N/A 07/24/2012   Procedure: BALLOON DILATION;  Surgeon: Inda Castle, MD;  Location: [facility];  Service: Endoscopy;  Laterality: N/A;  . BRAVO [place] STUDY N/A 07/24/2012   Procedure: BRAVO [place] STUDY;  Surgeon: Inda Castle, MD;  Location: [facility];  Service: Endoscopy;  Laterality: N/A;  . DILATION AND CURETTAGE OF UTERUS     SAB  . ESOPHAGOGASTRODUODENOSCOPY N/A 07/24/2012   Procedure: ESOPHAGOGASTRODUODENOSCOPY (EGD);  Surgeon: Inda Castle, MD;  Location: [facility];  Service: Endoscopy;  Laterality: N/A;  . OVARIAN CYST REMOVAL  2004   laparotomy -left  . ROBOTIC ASSISTED LAPAROSCOPIC LYSIS OF ADHESION N/A 03/21/2014   Procedure: ROBOTIC ASSISTED LAPAROSCOPIC EXTENSIVE LYSIS OF ADHESION (1 Hour);  Surgeon: Marvene Staff, MD;  Location: [facility];  Service: Gynecology;   Laterality: N/A;  . ROBOTIC ASSISTED SALPINGO OOPHERECTOMY Left 03/21/2014   Procedure:  ROBOTIC ASSISTED LEFT OOPHORECTOMY;  Surgeon: Marvene Staff, MD;  Location: [facility];  Service: Gynecology;  Laterality: Left;  . SHOULDER ARTHROSCOPY Left   . TENNIS ELBOW RELEASE/NIRSCHEL PROCEDURE Right 05/01/2015   Procedure: RIGHT ELBOW DEBRIDEMENT AND TENDON REPAIR;  Surgeon: [name], MD;  Location: [facility];  Service: Orthopedics;  Laterality: Right;  . WISDOM TOOTH EXTRACTION      There were no vitals filed for this visit.      Subjective Assessment - 02/17/16 1534    Subjective Pt reports that she has been keeping up with exercise program at home; with regular icing especially after she uses the pulleys.    Pertinent History s/p RTC repair   Limitations Lifting;House hold activities   Patient Stated Goals pt would like to recover from RTC surgery with normal range/strength of RUE so she can get back to independence/daily tasks   Currently in Pain? No/denies      Objective:    Therapeutic Exercise: Pulleys for R shoulder AAROM/PROM. Emphasis on passive R shoulder mobility in flexion/scaption/abduction 15 minutes total. Pt demonstrates increased ease of R shoulder mobility when performing pulleys versus therapist assisted PROM.   Manual tx: Emphasis on passive range of motion with oscillation at end range to promote increased range. PROM for  shoulder flexion (heavily guarded and difficulty time relaxing during shoulder mobilization), abduction and ER. Pt with mild increase in tolerable ROM this session for all planes: flexion/abd/ER. Forearm supination/pronation PROM x20.  Ice in sitting on R shoulder for 10 minutes following manual tx.   Pt response for medical necessity: Pt demonstrates improved ease of R shoulder ROM with pulleys in flexion, scaption, abduction. Minimal improvement in pt tolerance to PROM in flex,abd, ER. Pt with onset of R forearm pain with ER;  decreased with forearm supination/pronation PROM x20; improved R ER tolerance as pt fatigues. Demonstrates considerable R shoulder guarding.      PT Long Term Goals - 02/03/16 1338      PT LONG TERM GOAL #1   Title Pt will be able to achieve full R elbow flexion/extension with <2/10 pain to promote return to PLOF   Baseline 10/31: pt with consistent end range pain in elbow extension >7/10   Time 4   Period Weeks   Status New     PT LONG TERM GOAL #2   Title Pt will improve self-perceived QuickDash score to <75% to promote increase in functional mobility so she can perform household tasks.   Baseline 10/31: 95.5%   Time 4   Period Weeks   Status New     PT LONG TERM GOAL #3   Title Pt will increase R shoulder flexion/abduction AROM to >140, >161 respectively so that she can perform overhead lifting tasks    Baseline 10/31: pt with pain limited PROM R shoulder flexion 86 deg, R shoulder abduction 44 deg   Time 8   Period Weeks   Status New     PT LONG TERM GOAL #4   Title Pt will demonstrate equivalent R UE strength as compared to L UE strength to promote return to PLOF so she can perform work tasks   Baseline 10/31: RUE not tested 2/2 recent surgery, LUE shoulder flexion 4+, abduction 4+, biceps/triceps 5   Time 12   Period Weeks   Status New     PT LONG TERM GOAL #5   Title Pt will report <2/10 resting pain for R shoulder to promote pain free function so she can sleep through the night   Baseline 10/31: pt with 7/10 resting R shoulder pain; reports difficulty sleeping through the night due to muscle throbbing/pain   Time 4   Period Weeks   Status New           Plan - 02/17/16 1536    Clinical Impression Statement Pt with steady improvement in R shoulder AAROM with pulleys; able to achieve 134 deg abd, 126 deg flexion after multiple reps. Pt with noticeably decreased muscle spasm of R biceps with shoulder AAROM/PROM. Pt continues to have difficulty with R shoulder PROM  progression secondary to guarding; tolerating consistently >110 shoulder flexion/abduction PROM. Pt continues to demonstrate resistance to shoulder ER; improved range as she fatigues >20 deg.    Rehab Potential Good   Clinical Impairments Affecting Rehab Potential Positive: Age. Motivation. Active lifestyle.   PT Frequency 2x / week   PT Duration 8 weeks   PT Treatment/Interventions ADLs/Self Care Home Management;Biofeedback;Cryotherapy;Electrical Stimulation;Moist Heat;Functional mobility training;Therapeutic exercise;Patient/family education;Manual techniques;Scar mobilization;Passive range of motion   PT Next Visit Plan Address mobility/strength deficits per MD protocol.   PT Home Exercise Plan see pt instructions for detailed HEP program   Consulted and Agree with Plan of Care Patient      Patient will benefit from skilled  therapeutic intervention in order to improve the following deficits and impairments:  Decreased activity tolerance, Decreased mobility, Decreased range of motion, Decreased scar mobility, Hypomobility, Impaired flexibility, Impaired UE functional use, Pain, Improper body mechanics  Visit Diagnosis: Muscle weakness (generalized)  Chronic right shoulder pain  Stiffness of right shoulder, not elsewhere classified  Joint stiffness of elbow, right     Problem List Patient Active Problem List   Diagnosis Date Noted  . Neutropenia ([place]) 10/09/2015  . Health care maintenance 11/21/2014  . History of colonic polyps 11/21/2014  . Dysphagia 11/19/2014  . Trochanteric bursitis of left hip 10/08/2014  . URI (upper respiratory infection) 02/10/2014  . Pelvic pain in female 12/25/2013  . Personal history of ovarian cyst 12/25/2013  . Change in bowel movement 11/11/2013  . breast tenderness 09/29/2013  . Dysphagia, unspecified(787.20) 06/12/2012  . Barrett's esophagus 06/12/2012  . Environmental allergies 02/13/2012  . Enlarged thyroid 02/13/2012  . GERD  (gastroesophageal reflux disease) 02/08/2012  . Leukopenia 02/08/2012  . Fatigue 02/08/2012  . Hypercholesterolemia 02/08/2012   Pura Spice, PT, DPT # [phone] Mickel Baas Craig Ionescu SPT 02/17/2016, 3:44 PM  [facility] [address] [place], [place], [address] Phone: [phone]   Fax:  [phone]  Name: Kelly Massey MRN: [id number] Date of Birth: 20-Nov-1965

## 2016-02-19 ENCOUNTER — Ambulatory Visit: Payer: 59 | Admitting: Physical Therapy

## 2016-02-19 ENCOUNTER — Encounter: Payer: Self-pay | Admitting: Physical Therapy

## 2016-02-19 DIAGNOSIS — G8929 Other chronic pain: Secondary | ICD-10-CM

## 2016-02-19 DIAGNOSIS — M6281 Muscle weakness (generalized): Secondary | ICD-10-CM

## 2016-02-19 DIAGNOSIS — M25511 Pain in right shoulder: Secondary | ICD-10-CM

## 2016-02-19 DIAGNOSIS — M25611 Stiffness of right shoulder, not elsewhere classified: Secondary | ICD-10-CM

## 2016-02-19 DIAGNOSIS — M25621 Stiffness of right elbow, not elsewhere classified: Secondary | ICD-10-CM

## 2016-02-19 NOTE — Therapy (Signed)
[place] [facility] [address] [place], [place], [address] Phone: [phone]   Fax:  [phone]  Physical Therapy Treatment  Patient Details  Name: Kelly Massey MRN: [id number] Date of Birth: 1966-01-16 Referring Provider: [name] MD  Encounter Date: 02/19/2016      PT End of Session - 02/19/16 1019    Visit Number 6   Number of Visits 15   Date for PT Re-Evaluation 03/30/16   PT Start Time 1030   PT Stop Time 1125   PT Time Calculation (min) 55 min   Activity Tolerance Patient tolerated treatment well;Patient limited by pain   Behavior During Therapy [facility] for tasks assessed/performed      Past Medical History:  Diagnosis Date  . Allergy   . Anemia   . Arthritis    knees - no meds  . Asthma   . Colon polyps   . GERD (gastroesophageal reflux disease) 10/12/10   EGD, positive H. pylori  . HSV infection    History  . Hyperlipidemia    ? no meds - diet controlled  . Post-operative nausea and vomiting   . Seasonal allergies     Past Surgical History:  Procedure Laterality Date  . ABDOMINAL HYSTERECTOMY    . BALLOON DILATION N/A 07/24/2012   Procedure: BALLOON DILATION;  Surgeon: Inda Castle, MD;  Location: [facility];  Service: Endoscopy;  Laterality: N/A;  . BRAVO [place] STUDY N/A 07/24/2012   Procedure: BRAVO [place] STUDY;  Surgeon: Inda Castle, MD;  Location: [facility];  Service: Endoscopy;  Laterality: N/A;  . DILATION AND CURETTAGE OF UTERUS     SAB  . ESOPHAGOGASTRODUODENOSCOPY N/A 07/24/2012   Procedure: ESOPHAGOGASTRODUODENOSCOPY (EGD);  Surgeon: Inda Castle, MD;  Location: [facility];  Service: Endoscopy;  Laterality: N/A;  . OVARIAN CYST REMOVAL  2004   laparotomy -left  . ROBOTIC ASSISTED LAPAROSCOPIC LYSIS OF ADHESION N/A 03/21/2014   Procedure: ROBOTIC ASSISTED LAPAROSCOPIC EXTENSIVE LYSIS OF ADHESION (1 Hour);  Surgeon: Marvene Staff, MD;  Location: [facility];  Service: Gynecology;   Laterality: N/A;  . ROBOTIC ASSISTED SALPINGO OOPHERECTOMY Left 03/21/2014   Procedure:  ROBOTIC ASSISTED LEFT OOPHORECTOMY;  Surgeon: Marvene Staff, MD;  Location: [facility];  Service: Gynecology;  Laterality: Left;  . SHOULDER ARTHROSCOPY Left   . TENNIS ELBOW RELEASE/NIRSCHEL PROCEDURE Right 05/01/2015   Procedure: RIGHT ELBOW DEBRIDEMENT AND TENDON REPAIR;  Surgeon: [name], MD;  Location: [facility];  Service: Orthopedics;  Laterality: Right;  . WISDOM TOOTH EXTRACTION      There were no vitals filed for this visit.      Subjective Assessment - 02/19/16 1019    Subjective Pt. reports R shoulder stiffness/soreness but not pain prior to tx. session.  Pt. is 5 weeks s/p RTC repair.     Pertinent History s/p RTC repair   Limitations Lifting;House hold activities   Patient Stated Goals pt would like to recover from RTC surgery with normal range/strength of RUE so she can get back to independence/daily tasks   Currently in Pain? No/denies       Objective:  Therapeutic Exercise: Seated sh. pulleys for R shoulder AAROM/PROM (flexion/ abd.) 20x each for warm-up.  Pt. Issued wand AAROM ex. (see handouts).  Seated B UBE AAROM 1 min. F/b/f/b (no pain). Pt demonstrates increased ease of R shoulder mobility when performing pulleys/ wand versus therapist assisted PROM.  Standing wall ladder: flexion 10x (marked  progression with sticker).   Manual tx: Emphasis on passive range of motion with oscillation at end range to promote increased range. PROM for shoulder flexion (heavily guarded and difficulty time relaxing during shoulder mobilization), abduction and ER. Pt with mild increase in tolerable ROM this session for all planes: flexion/abd/ER. Forearm supination/pronation PROM x20.  Ice in sitting on R shoulder for 10 minutes following manual tx.  Pt response for medical necessity: Pt demonstrates improved ease of R shoulder ROM with pulleys in flexion, scaption,  abduction. Minimal improvement in pt tolerance to PROM in flex,abd, ER. Pt with onset of R forearm pain with ER; decreased with forearm supination/pronation PROM x20; improved R ER tolerance as pt fatigues. Demonstrates considerable R shoulder guarding.         PT Long Term Goals - 02/03/16 1338      PT LONG TERM GOAL #1   Title Pt will be able to achieve full R elbow flexion/extension with <2/10 pain to promote return to PLOF   Baseline 10/31: pt with consistent end range pain in elbow extension >7/10   Time 4   Period Weeks   Status New     PT LONG TERM GOAL #2   Title Pt will improve self-perceived QuickDash score to <75% to promote increase in functional mobility so she can perform household tasks.   Baseline 10/31: 95.5%   Time 4   Period Weeks   Status New     PT LONG TERM GOAL #3   Title Pt will increase R shoulder flexion/abduction AROM to >140, >161 respectively so that she can perform overhead lifting tasks    Baseline 10/31: pt with pain limited PROM R shoulder flexion 86 deg, R shoulder abduction 44 deg   Time 8   Period Weeks   Status New     PT LONG TERM GOAL #4   Title Pt will demonstrate equivalent R UE strength as compared to L UE strength to promote return to PLOF so she can perform work tasks   Baseline 10/31: RUE not tested 2/2 recent surgery, LUE shoulder flexion 4+, abduction 4+, biceps/triceps 5   Time 12   Period Weeks   Status New     PT LONG TERM GOAL #5   Title Pt will report <2/10 resting pain for R shoulder to promote pain free function so she can sleep through the night   Baseline 10/31: pt with 7/10 resting R shoulder pain; reports difficulty sleeping through the night due to muscle throbbing/pain   Time 4   Period Weeks   Status New           Plan - 02/19/16 1019    Clinical Impression Statement Pt. has difficulty with R shoulder PROM by PT as compared to seated shoulder pulley ex./ wand ex. due to guarding.  Pt. continues to  demonstrate resistance to shoulder ER due to pain/ fear of pain.  Pt. able to progress to more AAROM (wand/ wall ladder ex.) with increase R sh. ROM noted.   Minimal to no R UT compensatory movement patterns noted today.     Rehab Potential Good   Clinical Impairments Affecting Rehab Potential Positive: Age. Motivation. Active lifestyle.   PT Frequency 2x / week   PT Duration 8 weeks   PT Treatment/Interventions ADLs/Self Care Home Management;Biofeedback;Cryotherapy;Electrical Stimulation;Moist Heat;Functional mobility training;Therapeutic exercise;Patient/family education;Manual techniques;Scar mobilization;Passive range of motion   PT Next Visit Plan Progress R shoulder AAROM/ reassess cane ex. program.  Follow MD protocol.  PT Home Exercise Plan see pt instructions for detailed HEP program   Consulted and Agree with Plan of Care Patient      Patient will benefit from skilled therapeutic intervention in order to improve the following deficits and impairments:  Decreased activity tolerance, Decreased mobility, Decreased range of motion, Decreased scar mobility, Hypomobility, Impaired flexibility, Impaired UE functional use, Pain, Improper body mechanics  Visit Diagnosis: Muscle weakness (generalized)  Chronic right shoulder pain  Stiffness of right shoulder, not elsewhere classified  Joint stiffness of elbow, right     Problem List Patient Active Problem List   Diagnosis Date Noted  . Neutropenia ([place]) 10/09/2015  . Health care maintenance 11/21/2014  . History of colonic polyps 11/21/2014  . Dysphagia 11/19/2014  . Trochanteric bursitis of left hip 10/08/2014  . URI (upper respiratory infection) 02/10/2014  . Pelvic pain in female 12/25/2013  . Personal history of ovarian cyst 12/25/2013  . Change in bowel movement 11/11/2013  . breast tenderness 09/29/2013  . Dysphagia, unspecified(787.20) 06/12/2012  . Barrett's esophagus 06/12/2012  . Environmental allergies  02/13/2012  . Enlarged thyroid 02/13/2012  . GERD (gastroesophageal reflux disease) 02/08/2012  . Leukopenia 02/08/2012  . Fatigue 02/08/2012  . Hypercholesterolemia 02/08/2012   Pura Spice, PT, DPT # [phone] 02/20/2016, 1:34 PM  [facility] [address] [place], [place], [address] Phone: [phone]   Fax:  [phone]  Name: Kelly Massey MRN: [id number] Date of Birth: 1965-09-18

## 2016-02-23 ENCOUNTER — Ambulatory Visit: Payer: 59

## 2016-02-23 DIAGNOSIS — M6281 Muscle weakness (generalized): Secondary | ICD-10-CM

## 2016-02-23 DIAGNOSIS — M25511 Pain in right shoulder: Secondary | ICD-10-CM

## 2016-02-23 DIAGNOSIS — G8929 Other chronic pain: Secondary | ICD-10-CM

## 2016-02-23 NOTE — Therapy (Signed)
[facility] [address] [place], [place], [address] Phone: [phone]   Fax:  [phone]  Physical Therapy Treatment  Patient Details  Name: Kelly Massey MRN: [id number] Date of Birth: 08-08-1965 Referring Provider: [name] MD  Encounter Date: 02/23/2016      PT End of Session - 02/23/16 1033    Visit Number 7   Number of Visits 15   Date for PT Re-Evaluation 03/30/16   PT Start Time [id number]   PT Stop Time 1115   PT Time Calculation (min) 40 min   Activity Tolerance Patient tolerated treatment well;Patient limited by pain   Behavior During Therapy [facility] for tasks assessed/performed      Past Medical History:  Diagnosis Date  . Allergy   . Anemia   . Arthritis    knees - no meds  . Asthma   . Colon polyps   . GERD (gastroesophageal reflux disease) 10/12/10   EGD, positive H. pylori  . HSV infection    History  . Hyperlipidemia    ? no meds - diet controlled  . Post-operative nausea and vomiting   . Seasonal allergies     Past Surgical History:  Procedure Laterality Date  . ABDOMINAL HYSTERECTOMY    . BALLOON DILATION N/A 07/24/2012   Procedure: BALLOON DILATION;  Surgeon: Inda Castle, MD;  Location: [facility];  Service: Endoscopy;  Laterality: N/A;  . BRAVO [place] STUDY N/A 07/24/2012   Procedure: BRAVO [place] STUDY;  Surgeon: Inda Castle, MD;  Location: [facility];  Service: Endoscopy;  Laterality: N/A;  . DILATION AND CURETTAGE OF UTERUS     SAB  . ESOPHAGOGASTRODUODENOSCOPY N/A 07/24/2012   Procedure: ESOPHAGOGASTRODUODENOSCOPY (EGD);  Surgeon: Inda Castle, MD;  Location: [facility];  Service: Endoscopy;  Laterality: N/A;  . OVARIAN CYST REMOVAL  2004   laparotomy -left  . ROBOTIC ASSISTED LAPAROSCOPIC LYSIS OF ADHESION N/A 03/21/2014   Procedure: ROBOTIC ASSISTED LAPAROSCOPIC EXTENSIVE LYSIS OF ADHESION (1 Hour);  Surgeon: Marvene Staff, MD;  Location: [facility];  Service: Gynecology;   Laterality: N/A;  . ROBOTIC ASSISTED SALPINGO OOPHERECTOMY Left 03/21/2014   Procedure:  ROBOTIC ASSISTED LEFT OOPHORECTOMY;  Surgeon: Marvene Staff, MD;  Location: [facility];  Service: Gynecology;  Laterality: Left;  . SHOULDER ARTHROSCOPY Left   . TENNIS ELBOW RELEASE/NIRSCHEL PROCEDURE Right 05/01/2015   Procedure: RIGHT ELBOW DEBRIDEMENT AND TENDON REPAIR;  Surgeon: [name], MD;  Location: [place] [facility];  Service: Orthopedics;  Laterality: Right;  . WISDOM TOOTH EXTRACTION      There were no vitals filed for this visit.      Subjective Assessment - 02/23/16 1032    Subjective Pt reports some R shoulder soreness on this date. Exercises are going well without questions or concerns.    Pertinent History s/p RTC repair   Limitations Lifting;House hold activities   Patient Stated Goals pt would like to recover from RTC surgery with normal range/strength of RUE so she can get back to independence/daily tasks   Currently in Pain? Yes   Pain Score 6    Pain Location Shoulder   Pain Orientation Right   Pain Descriptors / Indicators Throbbing   Pain Type Surgical pain   Pain Frequency Constant     10/16/1  Objective:  Therapeutic Exercise:  Seated shoulder pulleys for R shoulder AAROM/PROM (flexion/ abd.) 20x each for warm-up.    Supine AAROM canes for flexion and ER  x 20 each; Seated B UBE AAROM 1 min forward and 1 minute backwards (no pain).  Standing wall ladder: flexion x 10, cues to not stand on tip toes and to decrease shoulder hiking; Pt demonstrates increased ease of R shoulder mobility when performing pulleys/ wand versus therapist assisted PROM.     Manual tx: Emphasis on R shoulder passive range of motion with oscillation at end range to promote increased range.  R shoulder AP mobilizations at neutral, grade I, 30 seconds/bout x 5 bouts; PROM for shoulder flexion, abduction, and ER within pain tolerable range (heavily guarded and difficult time  relaxing during shoulder mobility),  Forearm supination/pronation PROM x20. Supine R shoulder bent arm AROM flexion to 90 degrees x 10, no pain reported and no resistance noted by patient; Ice in sitting on R shoulder for 5 minutes (unbilled);  Pt response for medical necessity: Pt demonstrates improved ease of R shoulder ROM with pulleys in flexion and abduction. Minimal improvement in pt tolerance to PROM in flex,abd, ER however pt remains consistently guarded. Requires cues to avoid R shoulder hiking while performing ladder for flexion;                          PT Education - 02/23/16 1032    Education provided Yes   Education Details HEP reinforced   Person(s) Educated Patient   Methods Explanation   Comprehension Verbalized understanding             PT Long Term Goals - 02/03/16 1338      PT LONG TERM GOAL #1   Title Pt will be able to achieve full R elbow flexion/extension with <2/10 pain to promote return to PLOF   Baseline 10/31: pt with consistent end range pain in elbow extension >7/10   Time 4   Period Weeks   Status New     PT LONG TERM GOAL #2   Title Pt will improve self-perceived QuickDash score to <75% to promote increase in functional mobility so she can perform household tasks.   Baseline 10/31: 95.5%   Time 4   Period Weeks   Status New     PT LONG TERM GOAL #3   Title Pt will increase R shoulder flexion/abduction AROM to >140, >161 respectively so that she can perform overhead lifting tasks    Baseline 10/31: pt with pain limited PROM R shoulder flexion 86 deg, R shoulder abduction 44 deg   Time 8   Period Weeks   Status New     PT LONG TERM GOAL #4   Title Pt will demonstrate equivalent R UE strength as compared to L UE strength to promote return to PLOF so she can perform work tasks   Baseline 10/31: RUE not tested 2/2 recent surgery, LUE shoulder flexion 4+, abduction 4+, biceps/triceps 5   Time 12   Period Weeks   Status  New     PT LONG TERM GOAL #5   Title Pt will report <2/10 resting pain for R shoulder to promote pain free function so she can sleep through the night   Baseline 10/31: pt with 7/10 resting R shoulder pain; reports difficulty sleeping through the night due to muscle throbbing/pain   Time 4   Period Weeks   Status New               Plan - 02/23/16 1033    Clinical Impression Statement Pt demonstrates significant R shoulder guarding during  PROM by physical therapist. She begins to relax with oscillations and grade I mobilizations however she remains considerably protective of her R shoulder. She is able to progress her range of motion farther with AAROM canes in supine. Pt encouraged to continue HEP and follow-up as scheduled. No progression provided on this date per protocol.    Rehab Potential Good   Clinical Impairments Affecting Rehab Potential Positive: Age. Motivation. Active lifestyle.   PT Frequency 2x / week   PT Duration 8 weeks   PT Treatment/Interventions ADLs/Self Care Home Management;Biofeedback;Cryotherapy;Electrical Stimulation;Moist Heat;Functional mobility training;Therapeutic exercise;Patient/family education;Manual techniques;Scar mobilization;Passive range of motion   PT Next Visit Plan Progress R shoulder AAROM/ reassess cane ex. program.  Follow MD protocol.     PT Home Exercise Plan see pt instructions for detailed HEP program   Consulted and Agree with Plan of Care Patient      Patient will benefit from skilled therapeutic intervention in order to improve the following deficits and impairments:  Decreased activity tolerance, Decreased mobility, Decreased range of motion, Decreased scar mobility, Hypomobility, Impaired flexibility, Impaired UE functional use, Pain, Improper body mechanics  Visit Diagnosis: Muscle weakness (generalized)  Chronic right shoulder pain     Problem List Patient Active Problem List   Diagnosis Date Noted  . Neutropenia ([place])  10/09/2015  . Health care maintenance 11/21/2014  . History of colonic polyps 11/21/2014  . Dysphagia 11/19/2014  . Trochanteric bursitis of left hip 10/08/2014  . URI (upper respiratory infection) 02/10/2014  . Pelvic pain in female 12/25/2013  . Personal history of ovarian cyst 12/25/2013  . Change in bowel movement 11/11/2013  . breast tenderness 09/29/2013  . Dysphagia, unspecified(787.20) 06/12/2012  . Barrett's esophagus 06/12/2012  . Environmental allergies 02/13/2012  . Enlarged thyroid 02/13/2012  . GERD (gastroesophageal reflux disease) 02/08/2012  . Leukopenia 02/08/2012  . Fatigue 02/08/2012  . Hypercholesterolemia 02/08/2012   Phillips Grout PT, DPT   Jhada Risk 02/23/2016, 1:28 PM  [place] [address] [place], [place], [address] Phone: [phone]   Fax:  [phone]  Name: Kelly Massey MRN: [id number] Date of Birth: [birth year]

## 2016-02-25 ENCOUNTER — Encounter: Payer: 59 | Admitting: Physical Therapy

## 2016-03-02 ENCOUNTER — Encounter: Payer: 59 | Admitting: Physical Therapy

## 2016-03-04 ENCOUNTER — Encounter: Payer: Self-pay | Admitting: Physical Therapy

## 2016-03-04 ENCOUNTER — Ambulatory Visit: Payer: 59 | Admitting: Physical Therapy

## 2016-03-04 DIAGNOSIS — G8929 Other chronic pain: Secondary | ICD-10-CM

## 2016-03-04 DIAGNOSIS — M25611 Stiffness of right shoulder, not elsewhere classified: Secondary | ICD-10-CM

## 2016-03-04 DIAGNOSIS — M25511 Pain in right shoulder: Secondary | ICD-10-CM

## 2016-03-04 DIAGNOSIS — M6281 Muscle weakness (generalized): Secondary | ICD-10-CM | POA: Diagnosis not present

## 2016-03-04 DIAGNOSIS — M25621 Stiffness of right elbow, not elsewhere classified: Secondary | ICD-10-CM

## 2016-03-04 NOTE — Therapy (Addendum)
[facility] [address] [place], [place], [address] Phone: [phone]   Fax:  [phone]  Physical Therapy Treatment  Patient Details  Name: Kelly Massey MRN: [id number] Date of Birth: February 17, 1966 Referring Provider: [name] MD  Encounter Date: 03/04/2016      PT End of Session - 03/04/16 1034    Visit Number 8   Number of Visits 15   Date for PT Re-Evaluation 03/30/16   PT Start Time 1032   PT Stop Time 1119   PT Time Calculation (min) 47 min   Activity Tolerance Patient tolerated treatment well;Patient limited by pain   Behavior During Therapy [facility] for tasks assessed/performed      Past Medical History:  Diagnosis Date  . Allergy   . Anemia   . Arthritis    knees - no meds  . Asthma   . Colon polyps   . GERD (gastroesophageal reflux disease) 10/12/10   EGD, positive H. pylori  . HSV infection    History  . Hyperlipidemia    ? no meds - diet controlled  . Post-operative nausea and vomiting   . Seasonal allergies     Past Surgical History:  Procedure Laterality Date  . ABDOMINAL HYSTERECTOMY    . BALLOON DILATION N/A 07/24/2012   Procedure: BALLOON DILATION;  Surgeon: Inda Castle, MD;  Location: [facility];  Service: Endoscopy;  Laterality: N/A;  . BRAVO [place] STUDY N/A 07/24/2012   Procedure: BRAVO [place] STUDY;  Surgeon: Inda Castle, MD;  Location: [facility];  Service: Endoscopy;  Laterality: N/A;  . DILATION AND CURETTAGE OF UTERUS     SAB  . ESOPHAGOGASTRODUODENOSCOPY N/A 07/24/2012   Procedure: ESOPHAGOGASTRODUODENOSCOPY (EGD);  Surgeon: Inda Castle, MD;  Location: [facility];  Service: Endoscopy;  Laterality: N/A;  . OVARIAN CYST REMOVAL  2004   laparotomy -left  . ROBOTIC ASSISTED LAPAROSCOPIC LYSIS OF ADHESION N/A 03/21/2014   Procedure: ROBOTIC ASSISTED LAPAROSCOPIC EXTENSIVE LYSIS OF ADHESION (1 Hour);  Surgeon: Marvene Staff, MD;  Location: [facility];  Service: Gynecology;   Laterality: N/A;  . ROBOTIC ASSISTED SALPINGO OOPHERECTOMY Left 03/21/2014   Procedure:  ROBOTIC ASSISTED LEFT OOPHORECTOMY;  Surgeon: Marvene Staff, MD;  Location: [facility];  Service: Gynecology;  Laterality: Left;  . SHOULDER ARTHROSCOPY Left   . TENNIS ELBOW RELEASE/NIRSCHEL PROCEDURE Right 05/01/2015   Procedure: RIGHT ELBOW DEBRIDEMENT AND TENDON REPAIR;  Surgeon: [name], MD;  Location: [place];  Service: Orthopedics;  Laterality: Right;  . WISDOM TOOTH EXTRACTION      There were no vitals filed for this visit.      Subjective Assessment - 03/04/16 1031    Subjective Pt. states MD was happy with her progress on Tuesday.  MD prescribed a muscle relaxer for R UT/ shoulder tightness.  Pt. reports limited compliance with HEP over past couple of days due to R shoulder/neck discomfort.     Pertinent History s/p RTC repair   Limitations Lifting;House hold activities   Patient Stated Goals pt would like to recover from RTC surgery with normal range/strength of RUE so she can get back to independence/daily tasks   Currently in Pain? Yes   Pain Score 3    Pain Location Shoulder   Pain Orientation Right;Anterior   Pain Type Surgical pain      01/19/16 surgery.    Objective:  Therapeutic Exercise:  Seated B UBE 3.5 min. F/b ("feels good)- warm-up/no  charge.   Seated and standing wand ex. AAROM (all planes in pain tolerable range)- added bicep curls with 2# wt.  Supine A/AROM of R shoulder (flexion/ horiz. Abd./ serratus punches).   See new HEP (added isometrics at doorframe).     Manual tx: Emphasis on R shoulder AA/PROM with oscillation at end range to promote increased range.  R shoulder AP mobilizations at neutral, grade I, 30 seconds/bout x 5 bouts; PROM for shoulder flexion, abduction, and ER within pain tolerable range (heavily guarded and difficult time relaxing during shoulder mobility)- focus on ER due to significantly limited.  Supine R  shoulder bent arm AROM flexion to 90 degrees x 10, no pain reported and no resistance noted by patient.  Pt response for medical necessity: Pt demonstrates improved ease of R shoulder ROM with pulleys in flexion and abduction. Minimal improvement in pt tolerance to PROM in flex,abd, ER however pt remains consistently guarded. Requires cues to avoid R shoulder hiking while performing ladder for flexion.   Good technique with R shoulder isometrics at doorframe (limited muscle contraction noted with ER).  Pt. remains significantly limited with R shoulder AA/PROM of ER/IR in supine and seated position.  Pt. continues to muscle guard with ER/IR and >90 deg. of shoulder flexion.  Pt. unable to staticly hold R shoulder at >90 deg. after PROM due to muscle weakness/ fatigue.  Pt. will benefit from continued progression of R shoulder ROM/ pain-free isometrics to improve functional mobility.         PT Long Term Goals - 03/04/16 1359      PT LONG TERM GOAL #1   Title Pt will be able to achieve full R elbow flexion/extension with <2/10 pain to promote return to PLOF   Baseline R elbow AROM WNL but slight pain.   Time 4   Period Weeks   Status Partially Met     PT LONG TERM GOAL #2   Title Pt will improve self-perceived QuickDash score to <75% to promote increase in functional mobility so she can perform household tasks.   Baseline 11/30: 75% QuickDASH   Time 4   Period Weeks   Status Partially Met     PT LONG TERM GOAL #3   Title Pt will increase R shoulder flexion/abduction AROM to >140, >161 respectively so that she can perform overhead lifting tasks    Time 8   Period Weeks   Status Partially Met     PT LONG TERM GOAL #4   Title Pt will demonstrate equivalent R UE strength as compared to L UE strength to promote return to PLOF so she can perform work tasks   Time 12   Period Weeks   Status On-going     PT LONG TERM GOAL #5   Title Pt will report <2/10 resting pain for R shoulder to  promote pain free function so she can sleep through the night   Time 4   Period Weeks   Status Partially Met               Plan - 03/04/16 1034    Rehab Potential Good   Clinical Impairments Affecting Rehab Potential Positive: Age. Motivation. Active lifestyle.   PT Frequency 2x / week   PT Duration 8 weeks   PT Treatment/Interventions ADLs/Self Care Home Management;Biofeedback;Cryotherapy;Electrical Stimulation;Moist Heat;Functional mobility training;Therapeutic exercise;Patient/family education;Manual techniques;Scar mobilization;Passive range of motion   PT Next Visit Plan Progress R shoulder A/AROM and add gentle isometrics.  Follow MD protocol.  PT Home Exercise Plan see pt instructions for detailed HEP program   Consulted and Agree with Plan of Care Patient      Patient will benefit from skilled therapeutic intervention in order to improve the following deficits and impairments:  Decreased activity tolerance, Decreased mobility, Decreased range of motion, Decreased scar mobility, Hypomobility, Impaired flexibility, Impaired UE functional use, Pain, Improper body mechanics  Visit Diagnosis: Muscle weakness (generalized)  Chronic right shoulder pain  Stiffness of right shoulder, not elsewhere classified  Joint stiffness of elbow, right     Problem List Patient Active Problem List   Diagnosis Date Noted  . Neutropenia ([place]) 10/09/2015  . Health care maintenance 11/21/2014  . History of colonic polyps 11/21/2014  . Dysphagia 11/19/2014  . Trochanteric bursitis of left hip 10/08/2014  . URI (upper respiratory infection) 02/10/2014  . Pelvic pain in female 12/25/2013  . Personal history of ovarian cyst 12/25/2013  . Change in bowel movement 11/11/2013  . breast tenderness 09/29/2013  . Dysphagia, unspecified(787.20) 06/12/2012  . Barrett's esophagus 06/12/2012  . Environmental allergies 02/13/2012  . Enlarged thyroid 02/13/2012  . GERD (gastroesophageal  reflux disease) 02/08/2012  . Leukopenia 02/08/2012  . Fatigue 02/08/2012  . Hypercholesterolemia 02/08/2012   Pura Spice, PT, DPT # [phone] 03/04/2016, 2:02 PM  [facility] [address] [place], [place], [address] Phone: [phone]   Fax:  [phone]  Name: ONNIE ALATORRE MRN: [id number] Date of Birth: 1965-05-14

## 2016-03-08 ENCOUNTER — Encounter: Payer: Self-pay | Admitting: Physical Therapy

## 2016-03-08 ENCOUNTER — Ambulatory Visit: Payer: 59 | Attending: Orthopedic Surgery | Admitting: Physical Therapy

## 2016-03-08 DIAGNOSIS — M6281 Muscle weakness (generalized): Secondary | ICD-10-CM | POA: Insufficient documentation

## 2016-03-08 DIAGNOSIS — M25511 Pain in right shoulder: Secondary | ICD-10-CM | POA: Insufficient documentation

## 2016-03-08 DIAGNOSIS — M25611 Stiffness of right shoulder, not elsewhere classified: Secondary | ICD-10-CM | POA: Insufficient documentation

## 2016-03-08 DIAGNOSIS — M25621 Stiffness of right elbow, not elsewhere classified: Secondary | ICD-10-CM | POA: Insufficient documentation

## 2016-03-08 DIAGNOSIS — G8929 Other chronic pain: Secondary | ICD-10-CM | POA: Diagnosis present

## 2016-03-08 NOTE — Therapy (Signed)
[place] [facility] [address] [place], [place], [address] Phone: [phone]   Fax:  [phone]  Physical Therapy Treatment  Patient Details  Name: Kelly Massey MRN: [id number] Date of Birth: Jul 15, 1965 Referring Provider: [name] MD  Encounter Date: 03/08/2016      PT End of Session - 03/08/16 1117    Visit Number 9   Number of Visits 15   Date for PT Re-Evaluation 03/30/16   PT Start Time 1114   PT Stop Time 1205   PT Time Calculation (min) 51 min   Activity Tolerance Patient tolerated treatment well;Patient limited by pain   Behavior During Therapy [facility] for tasks assessed/performed      Past Medical History:  Diagnosis Date  . Allergy   . Anemia   . Arthritis    knees - no meds  . Asthma   . Colon polyps   . GERD (gastroesophageal reflux disease) 10/12/10   EGD, positive H. pylori  . HSV infection    History  . Hyperlipidemia    ? no meds - diet controlled  . Post-operative nausea and vomiting   . Seasonal allergies     Past Surgical History:  Procedure Laterality Date  . ABDOMINAL HYSTERECTOMY    . BALLOON DILATION N/A 07/24/2012   Procedure: BALLOON DILATION;  Surgeon: Inda Castle, MD;  Location: [facility];  Service: Endoscopy;  Laterality: N/A;  . BRAVO [place] STUDY N/A 07/24/2012   Procedure: BRAVO [place] STUDY;  Surgeon: Inda Castle, MD;  Location: [facility];  Service: Endoscopy;  Laterality: N/A;  . DILATION AND CURETTAGE OF UTERUS     SAB  . ESOPHAGOGASTRODUODENOSCOPY N/A 07/24/2012   Procedure: ESOPHAGOGASTRODUODENOSCOPY (EGD);  Surgeon: Inda Castle, MD;  Location: [facility];  Service: Endoscopy;  Laterality: N/A;  . OVARIAN CYST REMOVAL  2004   laparotomy -left  . ROBOTIC ASSISTED LAPAROSCOPIC LYSIS OF ADHESION N/A 03/21/2014   Procedure: ROBOTIC ASSISTED LAPAROSCOPIC EXTENSIVE LYSIS OF ADHESION (1 Hour);  Surgeon: Marvene Staff, MD;  Location: [facility];  Service: Gynecology;   Laterality: N/A;  . ROBOTIC ASSISTED SALPINGO OOPHERECTOMY Left 03/21/2014   Procedure:  ROBOTIC ASSISTED LEFT OOPHORECTOMY;  Surgeon: Marvene Staff, MD;  Location: [facility];  Service: Gynecology;  Laterality: Left;  . SHOULDER ARTHROSCOPY Left   . TENNIS ELBOW RELEASE/NIRSCHEL PROCEDURE Right 05/01/2015   Procedure: RIGHT ELBOW DEBRIDEMENT AND TENDON REPAIR;  Surgeon: [name], MD;  Location: [place];  Service: Orthopedics;  Laterality: Right;  . WISDOM TOOTH EXTRACTION      There were no vitals filed for this visit.      Subjective Assessment - 03/08/16 1114    Subjective Pt. reports no pain in R shoulder at rest but states she has "tightness, pinching" with R shoulder AROM (flexion/ scaption).     Pertinent History s/p RTC repair   Limitations Lifting;House hold activities   Patient Stated Goals pt would like to recover from RTC surgery with normal range/strength of RUE so she can get back to independence/daily tasks   Currently in Pain? No/denies      01/19/16 surgery.    Objective:  Therapeutic Exercise:  Seated B UBE 3 min. F/b (warm-up/no charge).   Seated and standing wand ex. AAROM (all planes in pain tolerable range)- added bicep curls with 2# wt.  Supine A/AROM of R shoulder (flexion/ horiz. Abd./ serratus punches).   Reviewed isometric HEP (see handouts)- 10 sec.  Holds as tolerated.  UT muscle stretches 3x with holds.       Manual tx: Emphasis on R shoulderAA/PROM with oscillation at end range to promote increased range.  R shoulder AP mobilizations at neutral, grade I, 30 seconds/bout x 5 bouts; PROM for shoulder flexion, abduction, and ER within pain tolerable range(heavily guarded and difficulttime relaxing during shoulder mobility)- focused on ER. Supine R shoulder bent arm AROM flexion to 90 degreesx 10, no pain reported and no resistance noted by patient;  Pt. Will ice at home as needed   Pt response for medical  necessity: Pt demonstrates improved ease of R shoulder ROM with pulleys in flexion and abduction. Minimal improvement in pt tolerance to PROM in flex,abd, ER however pt remains consistently guarded. Requires cues to avoid R shoulder hiking while performing ladder for flexion;       PT Long Term Goals - 03/04/16 1359      PT LONG TERM GOAL #1   Title Pt will be able to achieve full R elbow flexion/extension with <2/10 pain to promote return to PLOF   Baseline R elbow AROM WNL but slight pain.   Time 4   Period Weeks   Status Partially Met     PT LONG TERM GOAL #2   Title Pt will improve self-perceived QuickDash score to <75% to promote increase in functional mobility so she can perform household tasks.   Baseline 11/30: 75% QuickDASH   Time 4   Period Weeks   Status Partially Met     PT LONG TERM GOAL #3   Title Pt will increase R shoulder flexion/abduction AROM to >140, >161 respectively so that she can perform overhead lifting tasks    Time 8   Period Weeks   Status Partially Met     PT LONG TERM GOAL #4   Title Pt will demonstrate equivalent R UE strength as compared to L UE strength to promote return to PLOF so she can perform work tasks   Time 12   Period Weeks   Status On-going     PT LONG TERM GOAL #5   Title Pt will report <2/10 resting pain for R shoulder to promote pain free function so she can sleep through the night   Time 4   Period Weeks   Status Partially Met               Plan - 03/08/16 1117    Clinical Impression Statement Pt. continues to show consistent progress with R shoulder flexion/ abd. but remains significantly limited with ER (pain limited/ muscle guarding).   Good technique with isometrics/ scapular resistance and posture ex.  Minimal c/o pain with R shoulder at rest and encouraged to push R sh. ER/ abd. with HEP.     Rehab Potential Good   Clinical Impairments Affecting Rehab Potential Positive: Age. Motivation. Active lifestyle.   PT  Frequency 2x / week   PT Duration 8 weeks   PT Treatment/Interventions ADLs/Self Care Home Management;Biofeedback;Cryotherapy;Electrical Stimulation;Moist Heat;Functional mobility training;Therapeutic exercise;Patient/family education;Manual techniques;Scar mobilization;Passive range of motion   PT Next Visit Plan Progress R shoulder A/AROM and review gentle isometrics.  Follow MD protocol.     PT Home Exercise Plan see pt instructions for detailed HEP program   Consulted and Agree with Plan of Care Patient      Patient will benefit from skilled therapeutic intervention in order to improve the following deficits and impairments:  Decreased activity tolerance, Decreased mobility, Decreased range of  motion, Decreased scar mobility, Hypomobility, Impaired flexibility, Impaired UE functional use, Pain, Improper body mechanics  Visit Diagnosis: Muscle weakness (generalized)  Chronic right shoulder pain  Stiffness of right shoulder, not elsewhere classified  Joint stiffness of elbow, right     Problem List Patient Active Problem List   Diagnosis Date Noted  . Neutropenia ([place]) 10/09/2015  . Health care maintenance 11/21/2014  . History of colonic polyps 11/21/2014  . Dysphagia 11/19/2014  . Trochanteric bursitis of left hip 10/08/2014  . URI (upper respiratory infection) 02/10/2014  . Pelvic pain in female 12/25/2013  . Personal history of ovarian cyst 12/25/2013  . Change in bowel movement 11/11/2013  . breast tenderness 09/29/2013  . Dysphagia, unspecified(787.20) 06/12/2012  . Barrett's esophagus 06/12/2012  . Environmental allergies 02/13/2012  . Enlarged thyroid 02/13/2012  . GERD (gastroesophageal reflux disease) 02/08/2012  . Leukopenia 02/08/2012  . Fatigue 02/08/2012  . Hypercholesterolemia 02/08/2012   Pura Spice, PT, DPT # [phone] 03/09/2016, 1:51 PM  [facility] [address] [place], [place], [address] Phone:  [phone]   Fax:  [phone]  Name: Kelly Massey MRN: [id number] Date of Birth: 26-Jan-1966

## 2016-03-10 ENCOUNTER — Encounter: Payer: Self-pay | Admitting: Physical Therapy

## 2016-03-10 ENCOUNTER — Ambulatory Visit: Payer: 59 | Admitting: Physical Therapy

## 2016-03-10 ENCOUNTER — Encounter: Payer: 59 | Admitting: Physical Therapy

## 2016-03-10 DIAGNOSIS — M25611 Stiffness of right shoulder, not elsewhere classified: Secondary | ICD-10-CM

## 2016-03-10 DIAGNOSIS — G8929 Other chronic pain: Secondary | ICD-10-CM

## 2016-03-10 DIAGNOSIS — M25621 Stiffness of right elbow, not elsewhere classified: Secondary | ICD-10-CM

## 2016-03-10 DIAGNOSIS — M25511 Pain in right shoulder: Secondary | ICD-10-CM

## 2016-03-10 DIAGNOSIS — M6281 Muscle weakness (generalized): Secondary | ICD-10-CM

## 2016-03-10 NOTE — Therapy (Signed)
[facility] [facility] [place] [facility] [address] [place], [place], [address] Phone: [phone]   Fax:  [phone]  Physical Therapy Treatment  Patient Details  Name: Kelly Massey MRN: [id number] Date of Birth: 07/07/1965 Referring Provider: [name] MD  Encounter Date: 03/10/2016      PT End of Session - 03/10/16 1336    Visit Number 10   Number of Visits 15   Date for PT Re-Evaluation 03/30/16   PT Start Time 1427   PT Stop Time 1531   PT Time Calculation (min) 64 min   Activity Tolerance Patient tolerated treatment well;Patient limited by pain   Behavior During Therapy [facility] for tasks assessed/performed      Past Medical History:  Diagnosis Date  . Allergy   . Anemia   . Arthritis    knees - no meds  . Asthma   . Colon polyps   . GERD (gastroesophageal reflux disease) 10/12/10   EGD, positive H. pylori  . HSV infection    History  . Hyperlipidemia    ? no meds - diet controlled  . Post-operative nausea and vomiting   . Seasonal allergies     Past Surgical History:  Procedure Laterality Date  . ABDOMINAL HYSTERECTOMY    . BALLOON DILATION N/A 07/24/2012   Procedure: BALLOON DILATION;  Surgeon: Inda Castle, MD;  Location: [facility];  Service: Endoscopy;  Laterality: N/A;  . BRAVO [place] STUDY N/A 07/24/2012   Procedure: BRAVO [place] STUDY;  Surgeon: Inda Castle, MD;  Location: [facility];  Service: Endoscopy;  Laterality: N/A;  . DILATION AND CURETTAGE OF UTERUS     SAB  . ESOPHAGOGASTRODUODENOSCOPY N/A 07/24/2012   Procedure: ESOPHAGOGASTRODUODENOSCOPY (EGD);  Surgeon: Inda Castle, MD;  Location: [facility];  Service: Endoscopy;  Laterality: N/A;  . OVARIAN CYST REMOVAL  2004   laparotomy -left  . ROBOTIC ASSISTED LAPAROSCOPIC LYSIS OF ADHESION N/A 03/21/2014   Procedure: ROBOTIC ASSISTED LAPAROSCOPIC EXTENSIVE LYSIS OF ADHESION (1 Hour);  Surgeon: Marvene Staff, MD;  Location: [facility];  Service: Gynecology;   Laterality: N/A;  . ROBOTIC ASSISTED SALPINGO OOPHERECTOMY Left 03/21/2014   Procedure:  ROBOTIC ASSISTED LEFT OOPHORECTOMY;  Surgeon: Marvene Staff, MD;  Location: [facility];  Service: Gynecology;  Laterality: Left;  . SHOULDER ARTHROSCOPY Left   . TENNIS ELBOW RELEASE/NIRSCHEL PROCEDURE Right 05/01/2015   Procedure: RIGHT ELBOW DEBRIDEMENT AND TENDON REPAIR;  Surgeon: [name], MD;  Location: [place];  Service: Orthopedics;  Laterality: Right;  . WISDOM TOOTH EXTRACTION      There were no vitals filed for this visit.      Subjective Assessment - 03/10/16 1336    Subjective Pt. reports 6/10 R shoulder discomfort (increase aching today is probably due to weather).  Pt. did HEP with husband assist yesterday (wand shoulder flexion).     Pertinent History s/p RTC repair   Limitations Lifting;House hold activities   Patient Stated Goals pt would like to recover from RTC surgery with normal range/strength of RUE so she can get back to independence/daily tasks   Currently in Pain? Yes   Pain Score 6    Pain Location Shoulder   Pain Orientation Right   Pain Descriptors / Indicators Throbbing   Pain Type Surgical pain;Chronic pain      01/19/16 surgery.   Objective:  Therapeutic Exercise:  Seated B UBE 3 min. F/b (warm-up/no charge).  Seated and standing wand ex. AAROM (  all planes in pain tolerable range)- added bicep curls with 2# wt.  Standing tricep ext./ scap. Retraction with YTB (good technique)- no increase c/o pain.   Supine A/AROM of R shoulder (flexion/ horiz. Abd./ serratus punches).   Manual tx: Emphasis on R shoulderAA/PROMwith oscillation at end range to promote increased range.  R shoulder AP mobilizations at neutral, grade II-III, 30 seconds/bout x 5 bouts; PROM for shoulder flexion, abduction, and ER within pain tolerable range(heavily guarded and difficulttime relaxing during shoulder mobility)- focused on ER. Supine R shoulder  bent arm AROM flexion to 90 degreesx 10, extra time required.  Pt. Will ice at home as needed   Pt response for medical necessity: Pt demonstrates improved ease of R shoulder ROM with pulleys in flexion and abduction. Minimal improvement in pt tolerance to PROM in flex,abd, ER however pt remains consistently guarded. Requires cues to avoid R shoulder hiking while performing ladder for flexion;       PT Long Term Goals - 03/04/16 1359      PT LONG TERM GOAL #1   Title Pt will be able to achieve full R elbow flexion/extension with <2/10 pain to promote return to PLOF   Baseline R elbow AROM WNL but slight pain.   Time 4   Period Weeks   Status Partially Met     PT LONG TERM GOAL #2   Title Pt will improve self-perceived QuickDash score to <75% to promote increase in functional mobility so she can perform household tasks.   Baseline 11/30: 75% QuickDASH   Time 4   Period Weeks   Status Partially Met     PT LONG TERM GOAL #3   Title Pt will increase R shoulder flexion/abduction AROM to >140, >161 respectively so that she can perform overhead lifting tasks    Time 8   Period Weeks   Status Partially Met     PT LONG TERM GOAL #4   Title Pt will demonstrate equivalent R UE strength as compared to L UE strength to promote return to PLOF so she can perform work tasks   Time 12   Period Weeks   Status On-going     PT LONG TERM GOAL #5   Title Pt will report <2/10 resting pain for R shoulder to promote pain free function so she can sleep through the night   Time 4   Period Weeks   Status Partially Met               Plan - 03/10/16 1336    Clinical Impression Statement Pt. has continueed difficulty with R shoulder overhead reaching/ ER due to muscle weakness/ pain.  Progressing well with B scapular mobility/ strengthening but remains limited with R shoulder flexion/ abd. with R UT compensatory movement patterns.  Pt. works hard during PT tx. session but remains limited by  pain/ muscle fatigue.  Pt. has capsular tightness and difficulty relaxing R shoulder with manual tx.     Rehab Potential Good   Clinical Impairments Affecting Rehab Potential Positive: Age. Motivation. Active lifestyle.   PT Frequency 2x / week   PT Duration 8 weeks   PT Treatment/Interventions ADLs/Self Care Home Management;Biofeedback;Cryotherapy;Electrical Stimulation;Moist Heat;Functional mobility training;Therapeutic exercise;Patient/family education;Manual techniques;Scar mobilization;Passive range of motion   PT Next Visit Plan Progress R shoulder A/AROM and review gentle isometrics.  Follow MD protocol.     PT Home Exercise Plan see pt instructions for detailed HEP program   Consulted and Agree with Plan of Care  Patient      Patient will benefit from skilled therapeutic intervention in order to improve the following deficits and impairments:  Decreased activity tolerance, Decreased mobility, Decreased range of motion, Decreased scar mobility, Hypomobility, Impaired flexibility, Impaired UE functional use, Pain, Improper body mechanics  Visit Diagnosis: Muscle weakness (generalized)  Chronic right shoulder pain  Stiffness of right shoulder, not elsewhere classified  Joint stiffness of elbow, right     Problem List Patient Active Problem List   Diagnosis Date Noted  . Neutropenia ([place]) 10/09/2015  . Health care maintenance 11/21/2014  . History of colonic polyps 11/21/2014  . Dysphagia 11/19/2014  . Trochanteric bursitis of left hip 10/08/2014  . URI (upper respiratory infection) 02/10/2014  . Pelvic pain in female 12/25/2013  . Personal history of ovarian cyst 12/25/2013  . Change in bowel movement 11/11/2013  . breast tenderness 09/29/2013  . Dysphagia, unspecified(787.20) 06/12/2012  . Barrett's esophagus 06/12/2012  . Environmental allergies 02/13/2012  . Enlarged thyroid 02/13/2012  . GERD (gastroesophageal reflux disease) 02/08/2012  . Leukopenia 02/08/2012  .  Fatigue 02/08/2012  . Hypercholesterolemia 02/08/2012   Pura Spice, PT, DPT # [phone] 03/10/2016, 3:49 PM  [facility] [address] [place], [place], [address] Phone: [phone]   Fax:  [phone]  Name: Kelly Massey MRN: [id number] Date of Birth: 1966-04-04

## 2016-03-15 ENCOUNTER — Ambulatory Visit (INDEPENDENT_AMBULATORY_CARE_PROVIDER_SITE_OTHER): Payer: 59 | Admitting: Internal Medicine

## 2016-03-15 ENCOUNTER — Encounter: Payer: Self-pay | Admitting: Internal Medicine

## 2016-03-15 ENCOUNTER — Other Ambulatory Visit: Payer: Self-pay | Admitting: Internal Medicine

## 2016-03-15 VITALS — BP 120/80 | HR 97 | Wt 195.4 lb

## 2016-03-15 DIAGNOSIS — R232 Flushing: Secondary | ICD-10-CM

## 2016-03-15 DIAGNOSIS — E7800 Pure hypercholesterolemia, unspecified: Secondary | ICD-10-CM

## 2016-03-15 DIAGNOSIS — E049 Nontoxic goiter, unspecified: Secondary | ICD-10-CM

## 2016-03-15 DIAGNOSIS — K2100 Gastro-esophageal reflux disease with esophagitis, without bleeding: Secondary | ICD-10-CM

## 2016-03-15 DIAGNOSIS — Z86010 Personal history of colonic polyps: Secondary | ICD-10-CM | POA: Diagnosis not present

## 2016-03-15 DIAGNOSIS — D72819 Decreased white blood cell count, unspecified: Secondary | ICD-10-CM

## 2016-03-15 DIAGNOSIS — K2270 Barrett's esophagus without dysplasia: Secondary | ICD-10-CM

## 2016-03-15 NOTE — Telephone Encounter (Signed)
Please confirm with pt that she is using this medication and needs.  If so, ok to refill x 3

## 2016-03-15 NOTE — Telephone Encounter (Signed)
This medication was discontinued on 10/09/15, last OV 11/10/15. Ok to reorder?

## 2016-03-15 NOTE — Progress Notes (Signed)
Patient ID: Kelly Massey, female   DOB: Jan 29, 1966, 50 y.o.   MRN: [id number]   Subjective:    Patient ID: Kelly Massey, female    DOB: 1965-08-19, 50 y.o.   MRN: [id number]  HPI  Patient here for a scheduled follow up.  Recently had shoulder surgery.  Doing well.  Has been having problems with her right knee.  Seeing ortho.  Concern over possible meniscus tear.  S/p injection.  Plans to f/u with ortho.  Trying to stay active.  No chest pain.  No sob. No acid reflux now.  She is back on dexilant.  Wants to get her white count checked.  No abdominal pain or cramping.  Bowels stable.     Past Medical History:  Diagnosis Date  . Allergy   . Anemia   . Arthritis    knees - no meds  . Asthma   . Colon polyps   . GERD (gastroesophageal reflux disease) 10/12/10   EGD, positive H. pylori  . HSV infection    History  . Hyperlipidemia    ? no meds - diet controlled  . Post-operative nausea and vomiting   . Seasonal allergies    Past Surgical History:  Procedure Laterality Date  . ABDOMINAL HYSTERECTOMY    . BALLOON DILATION N/A 07/24/2012   Procedure: BALLOON DILATION;  Surgeon: Inda Castle, MD;  Location: [facility];  Service: Endoscopy;  Laterality: N/A;  . BRAVO [place] STUDY N/A 07/24/2012   Procedure: BRAVO [place] STUDY;  Surgeon: Inda Castle, MD;  Location: [facility];  Service: Endoscopy;  Laterality: N/A;  . DILATION AND CURETTAGE OF UTERUS     SAB  . ESOPHAGOGASTRODUODENOSCOPY N/A 07/24/2012   Procedure: ESOPHAGOGASTRODUODENOSCOPY (EGD);  Surgeon: Inda Castle, MD;  Location: [facility];  Service: Endoscopy;  Laterality: N/A;  . OVARIAN CYST REMOVAL  2004   laparotomy -left  . ROBOTIC ASSISTED LAPAROSCOPIC LYSIS OF ADHESION N/A 03/21/2014   Procedure: ROBOTIC ASSISTED LAPAROSCOPIC EXTENSIVE LYSIS OF ADHESION (1 Hour);  Surgeon: Marvene Staff, MD;  Location: [facility];  Service: Gynecology;  Laterality: N/A;  . ROBOTIC ASSISTED SALPINGO OOPHERECTOMY Left 03/21/2014     Procedure:  ROBOTIC ASSISTED LEFT OOPHORECTOMY;  Surgeon: Marvene Staff, MD;  Location: [facility];  Service: Gynecology;  Laterality: Left;  . SHOULDER ARTHROSCOPY Left   . TENNIS ELBOW RELEASE/NIRSCHEL PROCEDURE Right 05/01/2015   Procedure: RIGHT ELBOW DEBRIDEMENT AND TENDON REPAIR;  Surgeon: [name], MD;  Location: [facility];  Service: Orthopedics;  Laterality: Right;  . WISDOM TOOTH EXTRACTION     Family History  Problem Relation Age of Onset  . Arthritis Mother   . Stroke Mother   . Hypertension Mother   . Heart failure Mother   . Dementia Mother   . Breast cancer Maternal Aunt   . Prostate cancer Maternal Uncle     x 2  . Diabetes Sister   . Diabetes Brother     x 2  . Diabetes Paternal Grandmother   . Hypertension Other   . Hyperlipidemia Other   . Diabetes Paternal Grandfather   . Lung cancer Father   . Diabetes Maternal Uncle   . Throat cancer Maternal Aunt     Smoker  . Colon cancer Neg Hx    Social History   Social History  . Marital status: Married    Spouse name: N/A  . Number of children: 0  . Years of education: N/A  Occupational History  . RESEARCH [employer]   Social History Main Topics  . Smoking status: Never Smoker  . Smokeless tobacco: Never Used  . Alcohol use No  . Drug use: No  . Sexual activity: Yes    Birth control/ protection: None     Comment: hysterectomy   Other Topics Concern  . None   Social History Narrative  . None    Outpatient Encounter Prescriptions as of 03/15/2016  Medication Sig  . Calcium Carbonate-Vitamin D (CALCIUM 600+D3) 600-400 MG-UNIT per tablet Take 1 tablet by mouth daily.  Marland Kitchen DEXILANT 60 MG capsule TAKE 1 CAPSULE (60 MG TOTAL) BY MOUTH DAILY.  . ferrous sulfate 325 (65 FE) MG tablet Take 325 mg by mouth daily.   . Multiple Vitamins-Minerals (MULTIVITAMIN WITH MINERALS) tablet Take 1 tablet by mouth daily.  . ranitidine (ZANTAC) 150 MG tablet Take 300 mg by mouth daily  as needed for heartburn.   . vitamin E 400 UNIT capsule Take 400 Units by mouth daily. Reported on 10/09/2015   No facility-administered encounter medications on file as of 03/15/2016.     Review of Systems  Constitutional: Negative for appetite change and unexpected weight change.  HENT: Negative for congestion and sinus pressure.   Respiratory: Negative for cough, chest tightness and shortness of breath.   Cardiovascular: Negative for chest pain, palpitations and leg swelling.  Gastrointestinal: Negative for abdominal pain, diarrhea, nausea and vomiting.  Genitourinary: Negative for difficulty urinating and dysuria.  Musculoskeletal: Negative for back pain.       Right knee pain and swelling as outlined.  Followed by ortho.   Skin: Negative for color change and rash.  Neurological: Negative for dizziness, light-headedness and headaches.  Psychiatric/Behavioral: Negative for agitation and dysphoric mood.       Objective:    Physical Exam  Constitutional: She appears well-developed and well-nourished. No distress.  HENT:  Nose: Nose normal.  Mouth/Throat: Oropharynx is clear and moist.  Neck: Neck supple. No thyromegaly present.  Cardiovascular: Normal rate and regular rhythm.   Pulmonary/Chest: Breath sounds normal. No respiratory distress. She has no wheezes.  Abdominal: Soft. Bowel sounds are normal. There is no tenderness.  Musculoskeletal: She exhibits no edema or tenderness.  Lymphadenopathy:    She has no cervical adenopathy.  Skin: No rash noted. No erythema.  Psychiatric: She has a normal mood and affect. Her behavior is normal.    BP 120/80   Pulse 97   Wt 195 lb 6.4 oz (88.6 kg)   LMP 10/06/2011   SpO2 98%   BMI 31.54 kg/m  Wt Readings from Last 3 Encounters:  03/15/16 195 lb 6.4 oz (88.6 kg)  11/10/15 192 lb (87.1 kg)  10/09/15 189 lb 2.5 oz (85.8 kg)     Lab Results  Component Value Date   WBC 4.1 03/15/2016   HGB 13.4 03/15/2016   HCT 39.3  03/15/2016   PLT 207.0 03/15/2016   GLUCOSE 88 10/09/2015   CHOL 200 08/06/2015   TRIG 93.0 08/06/2015   HDL 67.70 08/06/2015   LDLCALC 114 (H) 08/06/2015   ALT 20 10/09/2015   AST 21 10/09/2015   NA 136 10/09/2015   K 4.5 10/09/2015   CL 101 10/09/2015   CREATININE 0.75 10/09/2015   BUN 9 10/09/2015   CO2 30 10/09/2015   TSH 0.66 08/06/2015    Dg Esophagus  Result Date: 10/13/2015 CLINICAL DATA:  Worsening of acid reflux symptoms ; difficulty swallowing solids ; throat and chest  burning and tight sensation when swallowing. Esophageal dilation in October 2016 EXAM: ESOPHOGRAM / BARIUM SWALLOW / BARIUM TABLET STUDY TECHNIQUE: Combined double contrast and single contrast examination performed using effervescent crystals, thick barium liquid, and thin barium liquid. The patient was observed with fluoroscopy swallowing a 13 mm barium sulphate tablet. FLUOROSCOPY TIME:  Fluoroscopy Time:  0 minutes, 54 seconds Number of Acquired Images:  14 COMPARISON:  None in PACs FINDINGS: The patient ingested the thick and thin barium and gas forming crystals without difficulty. The cervical esophagus distended well. There was no laryngeal penetration of the barium. The thoracic esophagus distended well. There was a small reducible hiatal hernia. A small amount of gastroesophageal reflux was observed with changing of patient positions. The esophageal mucosa appeared smooth. No ulceration or stricture was observed. The barium tablet passed without difficulty. The stomach was normal in a survey fashion. IMPRESSION: Small reducible hiatal hernia with small amount of gastroesophageal reflux. There is no evidence of a stricture nor of esophagitis. Electronically Signed   By: David  Martinique M.D.   On: 10/13/2015 09:43       Assessment & Plan:   Problem List Items Addressed This Visit    Barrett's esophagus    Followed by GI.  Back on dexilant.  Currently asymptomatic.        Enlarged thyroid    Previous  ultrasound - no nodules.  Follow thyroid function.        GERD (gastroesophageal reflux disease)    Controlled on dexilant.        History of colonic polyps    Colonoscopy 10/2010 - hyperplastic polyps.        Hypercholesterolemia    Low cholesterol diet and exercise.  Follow lipid panel.        Leukopenia - Primary    HIV and hepatitis panel negative.  W/up by hematology.  Recent cbc wnl.  On dexilant.  Recheck cbc.        Relevant Orders   CBC with Differential/Platelet (Completed)    Other Visit Diagnoses    Hot flashes       Relevant Orders   Follicle Stimulating Hormone (Completed)       Einar Pheasant, MD

## 2016-03-16 ENCOUNTER — Ambulatory Visit: Payer: 59 | Admitting: Physical Therapy

## 2016-03-16 DIAGNOSIS — M25611 Stiffness of right shoulder, not elsewhere classified: Secondary | ICD-10-CM

## 2016-03-16 DIAGNOSIS — M25621 Stiffness of right elbow, not elsewhere classified: Secondary | ICD-10-CM

## 2016-03-16 DIAGNOSIS — M6281 Muscle weakness (generalized): Secondary | ICD-10-CM | POA: Diagnosis not present

## 2016-03-16 DIAGNOSIS — M25511 Pain in right shoulder: Secondary | ICD-10-CM

## 2016-03-16 DIAGNOSIS — G8929 Other chronic pain: Secondary | ICD-10-CM

## 2016-03-16 LAB — CBC WITH DIFFERENTIAL/PLATELET
Basophils Absolute: 0 10*3/uL (ref 0.0–0.1)
Basophils Relative: 0.6 % (ref 0.0–3.0)
Eosinophils Absolute: 0 10*3/uL (ref 0.0–0.7)
Eosinophils Relative: 1.2 % (ref 0.0–5.0)
HCT: 39.3 % (ref 36.0–46.0)
Hemoglobin: 13.4 g/dL (ref 12.0–15.0)
Lymphocytes Relative: 36.6 % (ref 12.0–46.0)
Lymphs Abs: 1.5 10*3/uL (ref 0.7–4.0)
MCHC: 34.2 g/dL (ref 30.0–36.0)
MCV: 89.2 fl (ref 78.0–100.0)
Monocytes Absolute: 0.4 10*3/uL (ref 0.1–1.0)
Monocytes Relative: 9 % (ref 3.0–12.0)
Neutro Abs: 2.2 10*3/uL (ref 1.4–7.7)
Neutrophils Relative %: 52.6 % (ref 43.0–77.0)
Platelets: 207 10*3/uL (ref 150.0–400.0)
RBC: 4.4 Mil/uL (ref 3.87–5.11)
RDW: 14.5 % (ref 11.5–15.5)
WBC: 4.1 10*3/uL (ref 4.0–10.5)

## 2016-03-16 LAB — FOLLICLE STIMULATING HORMONE: FSH: 72.9 m[IU]/mL

## 2016-03-17 NOTE — Therapy (Signed)
[facility] [address] [place], [place], [address] Phone: [phone]   Fax:  [phone]  Physical Therapy Treatment  Patient Details  Name: Kelly Massey MRN: [id number] Date of Birth: 1965/06/26 Referring Provider: [name] MD  Encounter Date: 03/16/2016      PT End of Session - 03/17/16 1449    Visit Number 11   Number of Visits 15   Date for PT Re-Evaluation 03/30/16   PT Start Time 0258   PT Stop Time 1119   PT Time Calculation (min) 50 min   Activity Tolerance Patient tolerated treatment well;Patient limited by pain   Behavior During Therapy [facility] for tasks assessed/performed      Past Medical History:  Diagnosis Date  . Allergy   . Anemia   . Arthritis    knees - no meds  . Asthma   . Colon polyps   . GERD (gastroesophageal reflux disease) 10/12/10   EGD, positive H. pylori  . HSV infection    History  . Hyperlipidemia    ? no meds - diet controlled  . Post-operative nausea and vomiting   . Seasonal allergies     Past Surgical History:  Procedure Laterality Date  . ABDOMINAL HYSTERECTOMY    . BALLOON DILATION N/A 07/24/2012   Procedure: BALLOON DILATION;  Surgeon: Inda Castle, MD;  Location: [facility];  Service: Endoscopy;  Laterality: N/A;  . BRAVO [place] STUDY N/A 07/24/2012   Procedure: BRAVO [place] STUDY;  Surgeon: Inda Castle, MD;  Location: [facility];  Service: Endoscopy;  Laterality: N/A;  . DILATION AND CURETTAGE OF UTERUS     SAB  . ESOPHAGOGASTRODUODENOSCOPY N/A 07/24/2012   Procedure: ESOPHAGOGASTRODUODENOSCOPY (EGD);  Surgeon: Inda Castle, MD;  Location: [facility];  Service: Endoscopy;  Laterality: N/A;  . OVARIAN CYST REMOVAL  2004   laparotomy -left  . ROBOTIC ASSISTED LAPAROSCOPIC LYSIS OF ADHESION N/A 03/21/2014   Procedure: ROBOTIC ASSISTED LAPAROSCOPIC EXTENSIVE LYSIS OF ADHESION (1 Hour);  Surgeon: Marvene Staff, MD;  Location: [facility];  Service: Gynecology;   Laterality: N/A;  . ROBOTIC ASSISTED SALPINGO OOPHERECTOMY Left 03/21/2014   Procedure:  ROBOTIC ASSISTED LEFT OOPHORECTOMY;  Surgeon: Marvene Staff, MD;  Location: [facility];  Service: Gynecology;  Laterality: Left;  . SHOULDER ARTHROSCOPY Left   . TENNIS ELBOW RELEASE/NIRSCHEL PROCEDURE Right 05/01/2015   Procedure: RIGHT ELBOW DEBRIDEMENT AND TENDON REPAIR;  Surgeon: [name], MD;  Location: [facility];  Service: Orthopedics;  Laterality: Right;  . WISDOM TOOTH EXTRACTION      There were no vitals filed for this visit.      Subjective Assessment - 03/17/16 1447    Subjective Pt. states elbow "feels good" but R shoulder is stiff.  Pt. entered PT with limited R UE movement.  Pt. reports compliance with HEP.     Pertinent History s/p RTC repair   Limitations Lifting;House hold activities   Patient Stated Goals pt would like to recover from RTC surgery with normal range/strength of RUE so she can get back to independence/daily tasks   Currently in Pain? Yes   Pain Score 4    Pain Location Shoulder   Pain Orientation Right      01/19/16 surgery.   Objective:  Therapeutic Exercise:  Seated B UBE 3 min. F/b (warm-up/no charge).  Supine wand ex. AAROM (all planes in pain tolerable range).    Standing tricep ext./ scap. Retraction with YTB (  good technique)- no increase c/o pain/ bicep curls 3# 30x.   Nautilus: 20# lat. Pull downs 25x (no pain)- AAROM.     Supine A/AROM of R shoulder (flexion/ horiz. Abd./ serratus punches).   Manual tx: Supine/seated R shoulderAA/PROMwith oscillation at end range to promote increased range.  R shoulder AP mobilizations at neutral, grade II-III, 30 seconds/bout x 5 bouts; PROM for shoulder flexion, abduction, and ER within pain tolerable range(heavily guarded and difficulttime relaxing during shoulder mobility)- focused on ER. Supine R shoulder bent arm AROM flexion to 90 degreesx 10, extra time required.  Ice  to R shoulder in sitting position at end of tx. Session.    Pt response for medical necessity: Pt demonstrates minimal improvement in R shoulder AROM (flexion/ abd./ER). and remains pain limited/ muscle guarded.  Requires cues to avoid R shoulder hiking while performing overhead tasks.        PT Long Term Goals - 03/04/16 1359      PT LONG TERM GOAL #1   Title Pt will be able to achieve full R elbow flexion/extension with <2/10 pain to promote return to PLOF   Baseline R elbow AROM WNL but slight pain.   Time 4   Period Weeks   Status Partially Met     PT LONG TERM GOAL #2   Title Pt will improve self-perceived QuickDash score to <75% to promote increase in functional mobility so she can perform household tasks.   Baseline 11/30: 75% QuickDASH   Time 4   Period Weeks   Status Partially Met     PT LONG TERM GOAL #3   Title Pt will increase R shoulder flexion/abduction AROM to >140, >161 respectively so that she can perform overhead lifting tasks    Time 8   Period Weeks   Status Partially Met     PT LONG TERM GOAL #4   Title Pt will demonstrate equivalent R UE strength as compared to L UE strength to promote return to PLOF so she can perform work tasks   Time 12   Period Weeks   Status On-going     PT LONG TERM GOAL #5   Title Pt will report <2/10 resting pain for R shoulder to promote pain free function so she can sleep through the night   Time 4   Period Weeks   Status Partially Met               Plan - 03/17/16 1449    Clinical Impression Statement R shoulder joint/capsule stiffness remains pt. primary limitation and generalized muscle weakness present.  Limited with R shoulder flexion/ abd. >90 deg. with initial AROM in standing position.   Slight R shoulder (posterior deltoid) tenderness with palpation during manual tx.  Significant limitations with R shoulder ER (pt. remains very guarded due to pain/ fear of pain).     Clinical Impairments Affecting Rehab  Potential Positive: Age. Motivation. Active lifestyle.   PT Frequency 2x / week   PT Duration 8 weeks   PT Treatment/Interventions ADLs/Self Care Home Management;Biofeedback;Cryotherapy;Electrical Stimulation;Moist Heat;Functional mobility training;Therapeutic exercise;Patient/family education;Manual techniques;Scar mobilization;Passive range of motion   PT Next Visit Plan Progress R shoulder AROM and stability ex.  Follow MD protocol.     PT Home Exercise Plan see pt instructions for detailed HEP program   Consulted and Agree with Plan of Care Patient      Patient will benefit from skilled therapeutic intervention in order to improve the following deficits and impairments:  Decreased activity tolerance, Decreased mobility, Decreased range of motion, Decreased scar mobility, Hypomobility, Impaired flexibility, Impaired UE functional use, Pain, Improper body mechanics  Visit Diagnosis: Muscle weakness (generalized)  Chronic right shoulder pain  Stiffness of right shoulder, not elsewhere classified  Joint stiffness of elbow, right     Problem List Patient Active Problem List   Diagnosis Date Noted  . Neutropenia ([place]) 10/09/2015  . Health care maintenance 11/21/2014  . History of colonic polyps 11/21/2014  . Dysphagia 11/19/2014  . Trochanteric bursitis of left hip 10/08/2014  . URI (upper respiratory infection) 02/10/2014  . Pelvic pain in female 12/25/2013  . Personal history of ovarian cyst 12/25/2013  . Change in bowel movement 11/11/2013  . breast tenderness 09/29/2013  . Dysphagia, unspecified(787.20) 06/12/2012  . Barrett's esophagus 06/12/2012  . Environmental allergies 02/13/2012  . Enlarged thyroid 02/13/2012  . GERD (gastroesophageal reflux disease) 02/08/2012  . Leukopenia 02/08/2012  . Fatigue 02/08/2012  . Hypercholesterolemia 02/08/2012   Pura Spice, PT, DPT # [id number] 03/17/2016, 2:59 PM  [facility] [address] [place], [place], [address] Phone: [phone]   Fax:  [phone]  Name: Kelly Massey MRN: [id number] Date of Birth: Feb 01, 1966

## 2016-03-18 ENCOUNTER — Encounter: Payer: 59 | Admitting: Physical Therapy

## 2016-03-19 NOTE — Telephone Encounter (Signed)
LMTRC

## 2016-03-19 NOTE — Telephone Encounter (Signed)
Pt called back and left a vm stating that she is still using this nasal spray and to please refill it. Thank you!  Call pt @ [phone]

## 2016-03-21 ENCOUNTER — Encounter: Payer: Self-pay | Admitting: Internal Medicine

## 2016-03-21 NOTE — Assessment & Plan Note (Signed)
Worked up by hematology.  

## 2016-03-21 NOTE — Assessment & Plan Note (Signed)
HIV and hepatitis panel negative.  W/up by hematology.  Recent cbc wnl.  On dexilant.  Recheck cbc.

## 2016-03-21 NOTE — Assessment & Plan Note (Signed)
Previous ultrasound - no nodules.  Follow thyroid function.

## 2016-03-21 NOTE — Assessment & Plan Note (Signed)
Followed by GI.  Back on dexilant.  Currently asymptomatic.

## 2016-03-21 NOTE — Assessment & Plan Note (Signed)
Controlled on dexilant.   

## 2016-03-21 NOTE — Assessment & Plan Note (Signed)
Low cholesterol diet and exercise.  Follow lipid panel.   

## 2016-03-21 NOTE — Assessment & Plan Note (Signed)
Colonoscopy 10/2010 - hyperplastic polyps.   

## 2016-03-23 ENCOUNTER — Encounter: Payer: 59 | Admitting: Physical Therapy

## 2016-03-24 ENCOUNTER — Ambulatory Visit: Payer: 59 | Admitting: Physical Therapy

## 2016-03-24 DIAGNOSIS — M25621 Stiffness of right elbow, not elsewhere classified: Secondary | ICD-10-CM

## 2016-03-24 DIAGNOSIS — M25511 Pain in right shoulder: Secondary | ICD-10-CM

## 2016-03-24 DIAGNOSIS — M6281 Muscle weakness (generalized): Secondary | ICD-10-CM | POA: Diagnosis not present

## 2016-03-24 DIAGNOSIS — M25611 Stiffness of right shoulder, not elsewhere classified: Secondary | ICD-10-CM

## 2016-03-24 DIAGNOSIS — G8929 Other chronic pain: Secondary | ICD-10-CM

## 2016-03-25 ENCOUNTER — Encounter: Payer: Self-pay | Admitting: Physical Therapy

## 2016-03-25 ENCOUNTER — Encounter: Payer: 59 | Admitting: Physical Therapy

## 2016-03-25 NOTE — Therapy (Addendum)
[facility] [address]. [place], [place], [address] Phone: [phone]   Fax:  [phone]  Physical Therapy Treatment  Patient Details  Name: Kelly Massey MRN: [id number] Date of Birth: [birth year] Referring Provider: [name] MD  Encounter Date: 03/24/2016      PT End of Session - 03/25/16 1255    Visit Number 12   Number of Visits 15   Date for PT Re-Evaluation 03/30/16   PT Start Time 0258   PT Stop Time 1526   PT Time Calculation (min) 54 min   Activity Tolerance Patient tolerated treatment well;Patient limited by pain   Behavior During Therapy [facility] for tasks assessed/performed      Past Medical History:  Diagnosis Date  . Allergy   . Anemia   . Arthritis    knees - no meds  . Asthma   . Colon polyps   . GERD (gastroesophageal reflux disease) 10/12/10   EGD, positive H. pylori  . HSV infection    History  . Hyperlipidemia    ? no meds - diet controlled  . Post-operative nausea and vomiting   . Seasonal allergies     Past Surgical History:  Procedure Laterality Date  . ABDOMINAL HYSTERECTOMY    . BALLOON DILATION N/A 07/24/2012   Procedure: BALLOON DILATION;  Surgeon: Inda Castle, MD;  Location: [facility];  Service: Endoscopy;  Laterality: N/A;  . BRAVO [place] STUDY N/A 07/24/2012   Procedure: BRAVO [place] STUDY;  Surgeon: Inda Castle, MD;  Location: [facility];  Service: Endoscopy;  Laterality: N/A;  . DILATION AND CURETTAGE OF UTERUS     SAB  . ESOPHAGOGASTRODUODENOSCOPY N/A 07/24/2012   Procedure: ESOPHAGOGASTRODUODENOSCOPY (EGD);  Surgeon: Inda Castle, MD;  Location: [facility];  Service: Endoscopy;  Laterality: N/A;  . OVARIAN CYST REMOVAL  2004   laparotomy -left  . ROBOTIC ASSISTED LAPAROSCOPIC LYSIS OF ADHESION N/A 03/21/2014   Procedure: ROBOTIC ASSISTED LAPAROSCOPIC EXTENSIVE LYSIS OF ADHESION (1 Hour);  Surgeon: Marvene Staff, MD;  Location: [facility];  Service: Gynecology;   Laterality: N/A;  . ROBOTIC ASSISTED SALPINGO OOPHERECTOMY Left 03/21/2014   Procedure:  ROBOTIC ASSISTED LEFT OOPHORECTOMY;  Surgeon: Marvene Staff, MD;  Location: [facility];  Service: Gynecology;  Laterality: Left;  . SHOULDER ARTHROSCOPY Left   . TENNIS ELBOW RELEASE/NIRSCHEL PROCEDURE Right 05/01/2015   Procedure: RIGHT ELBOW DEBRIDEMENT AND TENDON REPAIR;  Surgeon: [name], MD;  Location: [place];  Service: Orthopedics;  Laterality: Right;  . WISDOM TOOTH EXTRACTION      There were no vitals filed for this visit.      Subjective Assessment - 03/25/16 1255    Pertinent History s/p RTC repair   Limitations Lifting;House hold activities   Patient Stated Goals pt would like to recover from RTC surgery with normal range/strength of RUE so she can get back to independence/daily tasks   Currently in Pain? Yes      Pt. reports R shoulder stiffness/ soreness prior to tx. session.  Pt. states her elbow is achy/hurting today.      Objective:  Therapeutic Exercise:  Seated B UBE 3 min. F/b (warm-up/no charge).  Seated pulley flexion/ abd. With added standing IR (difficult but able to complete with modifications).  Supine wand ex. AAROM (all planes in pain tolerable range).    Nautilus: 30# lat. Pull down/ tricep ext./ bilateral shoulder adduction 30x each.    Supine A/AROM of  R shoulder (flexion/ horiz. Abd./ serratus punches).   Manual tx: Supine/seated R shoulderAA/PROMwith oscillation at end range to promote increased range.  R shoulder AP mobilizations at neutral, grade II-III, 30 seconds/bout x 5 bouts; PROM for shoulder flexion, abduction, and ER within pain tolerable range.- focused on flexion and ER. Supine R shoulder bent arm AROM flexion to 90 degreesx 10, extra time required.  Ice to R shoulder in sitting position at end of tx. Session.    Pt response for medical necessity: Pt demonstrates minimal improvement in R shoulder AROM  (flexion/ abd./ER). and remains pain limited/ muscle guarded.  Requires cues to avoid R shoulder hiking while performing overhead tasks.     R elbow discomfort during UBE (both directions).  R shoulder joint stiffness remains with moderate hypomobility, esp. flexion/ ER.  PT encouraging pt. participate with more AROM/ overhead reaching tasks.  Focusing on less compensatory movement patterns.         PT Long Term Goals - 03/04/16 1359      PT LONG TERM GOAL #1   Title Pt will be able to achieve full R elbow flexion/extension with <2/10 pain to promote return to PLOF   Baseline R elbow AROM WNL but slight pain.   Time 4   Period Weeks   Status Partially Met     PT LONG TERM GOAL #2   Title Pt will improve self-perceived QuickDash score to <75% to promote increase in functional mobility so she can perform household tasks.   Baseline 11/30: 75% QuickDASH   Time 4   Period Weeks   Status Partially Met     PT LONG TERM GOAL #3   Title Pt will increase R shoulder flexion/abduction AROM to >140, >161 respectively so that she can perform overhead lifting tasks    Time 8   Period Weeks   Status Partially Met     PT LONG TERM GOAL #4   Title Pt will demonstrate equivalent R UE strength as compared to L UE strength to promote return to PLOF so she can perform work tasks   Time 12   Period Weeks   Status On-going     PT LONG TERM GOAL #5   Title Pt will report <2/10 resting pain for R shoulder to promote pain free function so she can sleep through the night   Time 4   Period Weeks   Status Partially Met               Plan - 03/25/16 1256    Rehab Potential Good   Clinical Impairments Affecting Rehab Potential Positive: Age. Motivation. Active lifestyle.   PT Frequency 2x / week   PT Duration 8 weeks   PT Treatment/Interventions ADLs/Self Care Home Management;Biofeedback;Cryotherapy;Electrical Stimulation;Moist Heat;Functional mobility training;Therapeutic  exercise;Patient/family education;Manual techniques;Scar mobilization;Passive range of motion   PT Next Visit Plan Progress R shoulder AROM and stability ex.  Follow MD protocol.     PT Home Exercise Plan see pt instructions for detailed HEP program      Patient will benefit from skilled therapeutic intervention in order to improve the following deficits and impairments:  Decreased activity tolerance, Decreased mobility, Decreased range of motion, Decreased scar mobility, Hypomobility, Impaired flexibility, Impaired UE functional use, Pain, Improper body mechanics  Visit Diagnosis: Muscle weakness (generalized)  Chronic right shoulder pain  Stiffness of right shoulder, not elsewhere classified  Joint stiffness of elbow, right     Problem List Patient Active Problem List   Diagnosis  Date Noted  . Neutropenia ([place]) 10/09/2015  . Health care maintenance 11/21/2014  . History of colonic polyps 11/21/2014  . Dysphagia 11/19/2014  . Trochanteric bursitis of left hip 10/08/2014  . URI (upper respiratory infection) 02/10/2014  . Pelvic pain in female 12/25/2013  . Personal history of ovarian cyst 12/25/2013  . Change in bowel movement 11/11/2013  . breast tenderness 09/29/2013  . Dysphagia, unspecified(787.20) 06/12/2012  . Barrett's esophagus 06/12/2012  . Environmental allergies 02/13/2012  . Enlarged thyroid 02/13/2012  . GERD (gastroesophageal reflux disease) 02/08/2012  . Leukopenia 02/08/2012  . Fatigue 02/08/2012  . Hypercholesterolemia 02/08/2012   Pura Spice, PT, DPT # [phone] 03/25/2016, 12:57 PM   [facility] [address] [place], [place], [address] Phone: [phone]   Fax:  [phone]  Name: Kelly Massey MRN: [id number] Date of Birth: 1965/11/09

## 2016-03-30 ENCOUNTER — Encounter: Payer: 59 | Admitting: Physical Therapy

## 2016-04-08 ENCOUNTER — Ambulatory Visit: Payer: 59 | Attending: Orthopedic Surgery | Admitting: Physical Therapy

## 2016-04-08 DIAGNOSIS — M25611 Stiffness of right shoulder, not elsewhere classified: Secondary | ICD-10-CM | POA: Diagnosis present

## 2016-04-08 DIAGNOSIS — G8929 Other chronic pain: Secondary | ICD-10-CM | POA: Diagnosis not present

## 2016-04-08 DIAGNOSIS — M6281 Muscle weakness (generalized): Secondary | ICD-10-CM | POA: Insufficient documentation

## 2016-04-08 DIAGNOSIS — M25511 Pain in right shoulder: Secondary | ICD-10-CM | POA: Diagnosis present

## 2016-04-08 DIAGNOSIS — M25621 Stiffness of right elbow, not elsewhere classified: Secondary | ICD-10-CM | POA: Diagnosis present

## 2016-04-09 NOTE — Therapy (Addendum)
[facility] [address] [place], [place], [address] Phone: [phone]   Fax:  [phone]  Physical Therapy Treatment  Patient Details  Name: Kelly Massey MRN: [id number] Date of Birth: 1966-02-06 Referring Provider: [name] MD  Encounter Date: 04/08/2016  Treatment visit: 46 of 58.  End of recert: [birth year]   Past Medical History:  Diagnosis Date  . Allergy   . Anemia   . Arthritis    knees - no meds  . Asthma   . Colon polyps   . GERD (gastroesophageal reflux disease) 10/12/10   EGD, positive H. pylori  . HSV infection    History  . Hyperlipidemia    ? no meds - diet controlled  . Post-operative nausea and vomiting   . Seasonal allergies     Past Surgical History:  Procedure Laterality Date  . ABDOMINAL HYSTERECTOMY    . BALLOON DILATION N/A 07/24/2012   Procedure: BALLOON DILATION;  Surgeon: Inda Castle, MD;  Location: [facility];  Service: Endoscopy;  Laterality: N/A;  . BRAVO [place] STUDY N/A 07/24/2012   Procedure: BRAVO [place] STUDY;  Surgeon: Inda Castle, MD;  [facility];  Service: Endoscopy;  Laterality: N/A;  . DILATION AND CURETTAGE OF UTERUS     SAB  . ESOPHAGOGASTRODUODENOSCOPY N/A 07/24/2012   Procedure: ESOPHAGOGASTRODUODENOSCOPY (EGD);  Surgeon: Inda Castle, MD;  Location: [facility];  Service: Endoscopy;  Laterality: N/A;  . OVARIAN CYST REMOVAL  2004   laparotomy -left  . ROBOTIC ASSISTED LAPAROSCOPIC LYSIS OF ADHESION N/A 03/21/2014   Procedure: ROBOTIC ASSISTED LAPAROSCOPIC EXTENSIVE LYSIS OF ADHESION (1 Hour);  Surgeon: Marvene Staff, MD;  Location: [facility];  Service: Gynecology;  Laterality: N/A;  . ROBOTIC ASSISTED SALPINGO OOPHERECTOMY Left 03/21/2014   Procedure:  ROBOTIC ASSISTED LEFT OOPHORECTOMY;  Surgeon: Marvene Staff, MD;  Location: [facility];  Service: Gynecology;  Laterality: Left;  . SHOULDER ARTHROSCOPY Left   . TENNIS ELBOW RELEASE/NIRSCHEL  PROCEDURE Right 05/01/2015   Procedure: RIGHT ELBOW DEBRIDEMENT AND TENDON REPAIR;  Surgeon: [name], MD;  Location: [place];  Service: Orthopedics;  Laterality: Right;  . WISDOM TOOTH EXTRACTION      There were no vitals filed for this visit.   Pt. States she continues to remain compliant with HEP/ strength progression ex. Program with no questions.  Pt. Reports R shoulder stiffness/ discomfort with repeated movements/ palpation.   3/10 R shoulder pain at this time (prior to tx. Session).      OBJECTIVE:  UBE 3 min. F/b (warm-up/ no charge).  There.ex.: Supine D1/D2 R shoulder PNF progressing to standing PNF with AAROM from PT 10x2 each (mirror feedback).  Supine serratus punches 10x3 (no wt.).  Standing Nautilus: 30# lat. Pull downs/ B sh. Adduction/ 20# tricep ext./ sh. Ext./ 30# scap. Retraction 30x each.  Attempted 10# press-ups with wand on Nautilus 18x with PT assist.  Unable to safely complete independently without limitations/ assist.  Reviewed HEP.  Manual tx.: Supine R shoulder AA/PROM all planes.  STM to R shoulder/ biceps region.      Pt response for medical necessity: benefits from progression of R shoulder ROM/ strengthening ex. Program to improve return to work without limitations.  Pt. Remains limited by R sh. Jt. Stiffness/ discomfort.      Pt. Has continued to show slow but consistent progress with R shoulder AROM: flexion 126 deg./ abd. 124 deg./ ER remains limited.  Pt.  Has been able to complete PNF pattern AROM in standing with cuing to prevent R UT compensation.  Difficulty with resisted press-ups on Nautilus at this time.  Pt. Has difficulty relaxing R shoulder during manual stretches/ mobs. And benefits from more active activities to encourage movement.  QuickDASH: 65.9% (slight improvement since initial evaluation).  Pt. Will benefit from continued skilled PT services to increase R shoulder AROM/ stability to improve pain-free mobility/ return to  work without restrictions.           PT Long Term Goals - 04/09/16 1232      PT LONG TERM GOAL #1   Title Pt will be able to achieve full R elbow flexion/extension with <2/10 pain to promote return to PLOF   Baseline R elbow AROM WNL but slight pain.   Time 4   Period Weeks   Status Achieved     PT LONG TERM GOAL #2   Title Pt will improve self-perceived QuickDash score to <35% to promote increase in functional mobility so she can perform household tasks.   Baseline 04/08/16: 65.9% (slight improvement)   Time 4   Period Weeks   Status New     PT LONG TERM GOAL #3   Title Pt will increase R shoulder flexion/abduction AROM to >140, >161 respectively so that she can perform overhead lifting tasks      PT LONG TERM GOAL #4   Title Pt will demonstrate equivalent R UE strength as compared to L UE strength to promote return to PLOF so she can perform work tasks   Baseline R UE strength grossly 3/5 MMT (ROM limitations), LUE shoulder flexion 4+, abduction 4+, biceps/triceps 5   Time 4   Period Weeks   Status Not Met     PT LONG TERM GOAL #5   Title Pt will report <2/10 resting pain for R shoulder to promote pain free function so she can sleep through the night   Baseline pt with 5/10 resting R shoulder pain; increase R shoulder muscle soreness   Time 4   Period Weeks   Status Not Met         Patient will benefit from skilled therapeutic intervention in order to improve the following deficits and impairments:  Decreased activity tolerance, Decreased mobility, Decreased range of motion, Decreased scar mobility, Hypomobility, Impaired flexibility, Impaired UE functional use, Pain, Improper body mechanics  Visit Diagnosis: Muscle weakness (generalized)  Chronic right shoulder pain  Stiffness of right shoulder, not elsewhere classified     Problem List Patient Active Problem List   Diagnosis Date Noted  . Neutropenia ([place]) 10/09/2015  . Health care maintenance 11/21/2014   . History of colonic polyps 11/21/2014  . Dysphagia 11/19/2014  . Trochanteric bursitis of left hip 10/08/2014  . URI (upper respiratory infection) 02/10/2014  . Pelvic pain in female 12/25/2013  . Personal history of ovarian cyst 12/25/2013  . Change in bowel movement 11/11/2013  . breast tenderness 09/29/2013  . Dysphagia, unspecified(787.20) 06/12/2012  . Barrett's esophagus 06/12/2012  . Environmental allergies 02/13/2012  . Enlarged thyroid 02/13/2012  . GERD (gastroesophageal reflux disease) 02/08/2012  . Leukopenia 02/08/2012  . Fatigue 02/08/2012  . Hypercholesterolemia 02/08/2012   Pura Spice, PT, DPT # [phone] 04/09/2016, 11:14 AM  [facility] [address] [place], [place], [address] Phone: [phone]   Fax:  [phone]  Name: Kelly Massey MRN: [id number] Date of Birth: July 06, 1965

## 2016-04-12 ENCOUNTER — Ambulatory Visit: Payer: 59 | Admitting: Physical Therapy

## 2016-04-12 DIAGNOSIS — M6281 Muscle weakness (generalized): Secondary | ICD-10-CM | POA: Diagnosis not present

## 2016-04-12 DIAGNOSIS — G8929 Other chronic pain: Secondary | ICD-10-CM

## 2016-04-12 DIAGNOSIS — M25611 Stiffness of right shoulder, not elsewhere classified: Secondary | ICD-10-CM

## 2016-04-12 DIAGNOSIS — M25621 Stiffness of right elbow, not elsewhere classified: Secondary | ICD-10-CM

## 2016-04-12 DIAGNOSIS — M25511 Pain in right shoulder: Secondary | ICD-10-CM

## 2016-04-12 NOTE — Therapy (Addendum)
[facility] [address] [place], [place], [address] Phone: [phone]   Fax:  [phone]  Physical Therapy Treatment  Patient Details  Name: Kelly Massey MRN: [id number] Date of Birth: 1966-03-04 Referring Provider: [name] MD  Encounter Date: 04/12/2016  Treatment 14 of 20.  Recert date: 12/12/33   Past Medical History:  Diagnosis Date  . Allergy   . Anemia   . Arthritis    knees - no meds  . Asthma   . Colon polyps   . GERD (gastroesophageal reflux disease) 10/12/10   EGD, positive H. pylori  . HSV infection    History  . Hyperlipidemia    ? no meds - diet controlled  . Post-operative nausea and vomiting   . Seasonal allergies     Past Surgical History:  Procedure Laterality Date  . ABDOMINAL HYSTERECTOMY    . BALLOON DILATION N/A 07/24/2012   Procedure: BALLOON DILATION;  Surgeon: Inda Castle, MD;  Location: [facility];  Service: Endoscopy;  Laterality: N/A;  . BRAVO [place] STUDY N/A 07/24/2012   Procedure: BRAVO [place] STUDY;  Surgeon: Inda Castle, MD;  [facility];  Service: Endoscopy;  Laterality: N/A;  . DILATION AND CURETTAGE OF UTERUS     SAB  . ESOPHAGOGASTRODUODENOSCOPY N/A 07/24/2012   Procedure: ESOPHAGOGASTRODUODENOSCOPY (EGD);  Surgeon: Inda Castle, MD;  Location: [facility];  Service: Endoscopy;  Laterality: N/A;  . OVARIAN CYST REMOVAL  2004   laparotomy -left  . ROBOTIC ASSISTED LAPAROSCOPIC LYSIS OF ADHESION N/A 03/21/2014   Procedure: ROBOTIC ASSISTED LAPAROSCOPIC EXTENSIVE LYSIS OF ADHESION (1 Hour);  Surgeon: Marvene Staff, MD;  Location: [facility];  Service: Gynecology;  Laterality: N/A;  . ROBOTIC ASSISTED SALPINGO OOPHERECTOMY Left 03/21/2014   Procedure:  ROBOTIC ASSISTED LEFT OOPHORECTOMY;  Surgeon: Marvene Staff, MD;  Location: [facility];  Service: Gynecology;  Laterality: Left;  . SHOULDER ARTHROSCOPY Left   . TENNIS ELBOW RELEASE/NIRSCHEL PROCEDURE Right  05/01/2015   Procedure: RIGHT ELBOW DEBRIDEMENT AND TENDON REPAIR;  Surgeon: [name], MD;  Location: [place];  Service: Orthopedics;  Laterality: Right;  . WISDOM TOOTH EXTRACTION      There were no vitals filed for this visit.    Manual therapy:   R Shoulder Supine AROM/stretching w/ wand: -flexion w/ some SPT assist -abduction -R. Shoulder supine external rotation -Rhythmic stabilization R Shoulder 3 bouts of 30 sec.   There. Ex:   Nautilus ex: - standing shoulder extension 30# x30 - pull downs 40# x20 - scap.retraction 30# x30 - chest press 30# x20 - shoulder abd 30# x20 - triceps 30# x20 - bicep curls 3# dumbbell x30 each arm - dumbbell punches 2# x20 each arm  Pt. Tolerated exercises well despite reporting some soreness in R shoulder. Pt reports compliance with HEP and still demonstrates generalized R shoulder weakness and lacks full ROM in R shoulder.     R shoulder joint/capsule stiffness remains with pt. reporting soreness and tightness around the R shoulder. Supine R shoulder ER still limited but showing improvement in ROM using a wand for assistance. R shoulder flexion/abd ROM still limited >90 deg. Pt. reported fatigue and tightness in R shoulder during strength exercises (chest press/abd./dumbell punches).         PT Long Term Goals - 04/09/16 1232      PT LONG TERM GOAL #1   Title Pt will be able to achieve full R elbow  flexion/extension with <2/10 pain to promote return to PLOF   Baseline R elbow AROM WNL but slight pain.   Time 4   Period Weeks   Status Achieved     PT LONG TERM GOAL #2   Title Pt will improve self-perceived QuickDash score to <35% to promote increase in functional mobility so she can perform household tasks.   Baseline 04/08/16: 65.9% (slight improvement)   Time 4   Period Weeks   Status New     PT LONG TERM GOAL #3   Title Pt will increase R shoulder flexion/abduction AROM to >140, >161 respectively so  that she can perform overhead lifting tasks    Time 4   Period Weeks   Status Not Met     PT LONG TERM GOAL #4   Title Pt will demonstrate equivalent R UE strength as compared to L UE strength to promote return to PLOF so she can perform work tasks   Baseline R UE strength grossly 3/5 MMT (ROM limitations), LUE shoulder flexion 4+, abduction 4+, biceps/triceps 5   Time 4   Period Weeks   Status Not Met     PT LONG TERM GOAL #5   Title Pt will report <2/10 resting pain for R shoulder to promote pain free function so she can sleep through the night   Baseline pt with 5/10 resting R shoulder pain; increase R shoulder muscle soreness   Time 4   Period Weeks   Status Not Met      Patient will benefit from skilled therapeutic intervention in order to improve the following deficits and impairments:  Decreased activity tolerance, Decreased mobility, Decreased range of motion, Decreased scar mobility, Hypomobility, Impaired flexibility, Impaired UE functional use, Pain, Improper body mechanics  Visit Diagnosis: Muscle weakness (generalized)  Stiffness of right shoulder, not elsewhere classified  Chronic right shoulder pain  Joint stiffness of elbow, right  Muscle right arm weakness     Problem List Patient Active Problem List   Diagnosis Date Noted  . Neutropenia ([place]) 10/09/2015  . Health care maintenance 11/21/2014  . History of colonic polyps 11/21/2014  . Dysphagia 11/19/2014  . Trochanteric bursitis of left hip 10/08/2014  . URI (upper respiratory infection) 02/10/2014  . Pelvic pain in female 12/25/2013  . Personal history of ovarian cyst 12/25/2013  . Change in bowel movement 11/11/2013  . breast tenderness 09/29/2013  . Dysphagia, unspecified(787.20) 06/12/2012  . Barrett's esophagus 06/12/2012  . Environmental allergies 02/13/2012  . Enlarged thyroid 02/13/2012  . GERD (gastroesophageal reflux disease) 02/08/2012  . Leukopenia 02/08/2012  . Fatigue 02/08/2012   . Hypercholesterolemia 02/08/2012    Pura Spice, PT, DPT # [id number] Willodean Rosenthal, SPT 04/13/16, 3:15 PM  [facility] [address] [place], [place], [address] Phone: [phone]   Fax:  [phone]  Name: AKIRE RENNERT MRN: [id number] Date of Birth: 02-05-1966

## 2016-04-13 ENCOUNTER — Encounter: Payer: 59 | Admitting: Physical Therapy

## 2016-04-15 ENCOUNTER — Ambulatory Visit: Payer: 59 | Admitting: Physical Therapy

## 2016-04-15 ENCOUNTER — Encounter: Payer: Self-pay | Admitting: Physical Therapy

## 2016-04-15 DIAGNOSIS — M6281 Muscle weakness (generalized): Secondary | ICD-10-CM | POA: Diagnosis not present

## 2016-04-15 DIAGNOSIS — G8929 Other chronic pain: Secondary | ICD-10-CM

## 2016-04-15 DIAGNOSIS — M25611 Stiffness of right shoulder, not elsewhere classified: Secondary | ICD-10-CM

## 2016-04-15 DIAGNOSIS — M25511 Pain in right shoulder: Secondary | ICD-10-CM

## 2016-04-15 DIAGNOSIS — M25621 Stiffness of right elbow, not elsewhere classified: Secondary | ICD-10-CM

## 2016-04-16 NOTE — Therapy (Signed)
[place] [facility] [address]. [place], [place], [address] Phone: [phone]   Fax:  [phone]  Physical Therapy Treatment  Patient Details  Name: Kelly Massey MRN: [id number] Date of Birth: [birth year] Referring Provider: [name] MD  Encounter Date: 04/15/2016      PT End of Session - 04/16/16 1558    Visit Number 15   Number of Visits 20   Date for PT Re-Evaluation 05/06/16   PT Start Time 1113   PT Stop Time 1210   PT Time Calculation (min) 57 min   Activity Tolerance Patient tolerated treatment well;Patient limited by pain;Patient limited by fatigue   Behavior During Therapy [facility] for tasks assessed/performed      Past Medical History:  Diagnosis Date  . Allergy   . Anemia   . Arthritis    knees - no meds  . Asthma   . Colon polyps   . GERD (gastroesophageal reflux disease) 10/12/10   EGD, positive H. pylori  . HSV infection    History  . Hyperlipidemia    ? no meds - diet controlled  . Post-operative nausea and vomiting   . Seasonal allergies     Past Surgical History:  Procedure Laterality Date  . ABDOMINAL HYSTERECTOMY    . BALLOON DILATION N/A 07/24/2012   Procedure: BALLOON DILATION;  Surgeon: Inda Castle, MD;  Location: [facility];  Service: Endoscopy;  Laterality: N/A;  . BRAVO [place] STUDY N/A 07/24/2012   Procedure: BRAVO [place] STUDY;  Surgeon: Inda Castle, MD;  Location: [facility];  Service: Endoscopy;  Laterality: N/A;  . DILATION AND CURETTAGE OF UTERUS     SAB  . ESOPHAGOGASTRODUODENOSCOPY N/A 07/24/2012   Procedure: ESOPHAGOGASTRODUODENOSCOPY (EGD);  Surgeon: Inda Castle, MD;  Location: [facility];  Service: Endoscopy;  Laterality: N/A;  . OVARIAN CYST REMOVAL  2004   laparotomy -left  . ROBOTIC ASSISTED LAPAROSCOPIC LYSIS OF ADHESION N/A 03/21/2014   Procedure: ROBOTIC ASSISTED LAPAROSCOPIC EXTENSIVE LYSIS OF ADHESION (1 Hour);  Surgeon: Marvene Staff, MD;  Location: [facility];  Service: Gynecology;  Laterality: N/A;  . ROBOTIC ASSISTED SALPINGO OOPHERECTOMY Left 03/21/2014   Procedure:  ROBOTIC ASSISTED LEFT OOPHORECTOMY;  Surgeon: Marvene Staff, MD;  Location: [facility];  Service: Gynecology;  Laterality: Left;  . SHOULDER ARTHROSCOPY Left   . TENNIS ELBOW RELEASE/NIRSCHEL PROCEDURE Right 05/01/2015   Procedure: RIGHT ELBOW DEBRIDEMENT AND TENDON REPAIR;  Surgeon: [name], MD;  Location: [facility];  Service: Orthopedics;  Laterality: Right;  . WISDOM TOOTH EXTRACTION      There were no vitals filed for this visit.      Subjective Assessment - 04/15/16 1117    Subjective Pt. reported she woke up this morning with 9/10 but has decreased to 6/10 by start of tx. She stated she hasn't done anything out of the ordinary to aggravate her shoulder. Pt. reports elbow feels alright today.    Pertinent History s/p RTC repair   Limitations Lifting;House hold activities   Patient Stated Goals pt would like to recover from RTC surgery with normal range/strength of RUE so she can get back to independence/daily tasks   Currently in Pain? Yes   Pain Score 6    Pain Location Shoulder   Pain Orientation Right   Pain Descriptors / Indicators Sore   Pain Type Surgical pain;Chronic pain   Pain Onset 1 to 4 weeks ago  PT Long Term Goals - 03/04/16 1359      PT LONG TERM GOAL #1   Title Pt will be able to achieve full R elbow flexion/extension with <2/10 pain to promote return to PLOF   Baseline R elbow AROM WNL but slight pain.   Time 4   Period Weeks   Status Partially Met     PT LONG TERM GOAL #2   Title Pt will improve self-perceived QuickDash score to <75% to promote increase in functional mobility so she can perform household tasks.   Baseline 11/30: 75% QuickDASH   Time 4   Period Weeks   Status Partially Met     PT LONG TERM GOAL #3   Title Pt will increase R  shoulder flexion/abduction AROM to >140, >161 respectively so that she can perform overhead lifting tasks    Time 8   Period Weeks   Status Partially Met     PT LONG TERM GOAL #4   Title Pt will demonstrate equivalent R UE strength as compared to L UE strength to promote return to PLOF so she can perform work tasks   Time 12   Period Weeks   Status On-going     PT LONG TERM GOAL #5   Title Pt will report <2/10 resting pain for R shoulder to promote pain free function so she can sleep through the night   Time 4   Period Weeks   Status Partially Met               Plan - 04/16/16 1559    Rehab Potential Good   Clinical Impairments Affecting Rehab Potential Positive: Age. Motivation. Active lifestyle.   PT Frequency 2x / week   PT Duration 4 weeks   PT Treatment/Interventions ADLs/Self Care Home Management;Biofeedback;Cryotherapy;Electrical Stimulation;Moist Heat;Functional mobility training;Therapeutic exercise;Patient/family education;Manual techniques;Scar mobilization;Passive range of motion   PT Next Visit Plan Progress R shoulder AROM and stability ex.  Follow MD protocol.     PT Home Exercise Plan see pt instructions for detailed HEP program      Patient will benefit from skilled therapeutic intervention in order to improve the following deficits and impairments:  Decreased activity tolerance, Decreased mobility, Decreased range of motion, Decreased scar mobility, Hypomobility, Impaired flexibility, Impaired UE functional use, Pain, Improper body mechanics  Visit Diagnosis: Muscle weakness (generalized)  Stiffness of right shoulder, not elsewhere classified  Chronic right shoulder pain  Joint stiffness of elbow, right     Problem List Patient Active Problem List   Diagnosis Date Noted  . Neutropenia ([place]) 10/09/2015  . Health care maintenance 11/21/2014  . History of colonic polyps 11/21/2014  . Dysphagia 11/19/2014  . Trochanteric bursitis of left hip  10/08/2014  . URI (upper respiratory infection) 02/10/2014  . Pelvic pain in female 12/25/2013  . Personal history of ovarian cyst 12/25/2013  . Change in bowel movement 11/11/2013  . breast tenderness 09/29/2013  . Dysphagia, unspecified(787.20) 06/12/2012  . Barrett's esophagus 06/12/2012  . Environmental allergies 02/13/2012  . Enlarged thyroid 02/13/2012  . GERD (gastroesophageal reflux disease) 02/08/2012  . Leukopenia 02/08/2012  . Fatigue 02/08/2012  . Hypercholesterolemia 02/08/2012    Pura Spice 04/16/2016, 4:01 PM  [place] [facility] [address] [place], [place], [address] Phone: [phone]   Fax:  [phone]  Name: Kelly Massey MRN: [id number] Date of Birth: 21-Aug-1965

## 2016-04-19 ENCOUNTER — Encounter: Payer: 59 | Admitting: Physical Therapy

## 2016-04-21 ENCOUNTER — Encounter: Payer: 59 | Admitting: Physical Therapy

## 2016-04-23 ENCOUNTER — Ambulatory Visit: Payer: 59 | Admitting: Physical Therapy

## 2016-04-27 ENCOUNTER — Ambulatory Visit: Payer: 59 | Admitting: Physical Therapy

## 2016-04-27 DIAGNOSIS — G8929 Other chronic pain: Secondary | ICD-10-CM

## 2016-04-27 DIAGNOSIS — M25511 Pain in right shoulder: Secondary | ICD-10-CM

## 2016-04-27 DIAGNOSIS — M6281 Muscle weakness (generalized): Secondary | ICD-10-CM

## 2016-04-27 DIAGNOSIS — M25611 Stiffness of right shoulder, not elsewhere classified: Secondary | ICD-10-CM

## 2016-04-28 ENCOUNTER — Encounter: Payer: Self-pay | Admitting: Physical Therapy

## 2016-04-28 NOTE — Therapy (Signed)
[place] [facility] [address] [place], [place], [address] Phone: [phone]   Fax:  [phone]  Physical Therapy Treatment  Patient Details  Name: Kelly Massey MRN: [id number] Date of Birth: [birth year] Referring Provider: [name] MD  Encounter Date: 04/27/2016      PT End of Session - 04/28/16 1617    Visit Number 16   Number of Visits 20   Date for PT Re-Evaluation 05/06/16   PT Start Time 1033   PT Stop Time 9470   PT Time Calculation (min) 55 min   Activity Tolerance Patient tolerated treatment well;Patient limited by pain;Patient limited by fatigue   Behavior During Therapy [facility] for tasks assessed/performed      Past Medical History:  Diagnosis Date  . Allergy   . Anemia   . Arthritis    knees - no meds  . Asthma   . Colon polyps   . GERD (gastroesophageal reflux disease) 10/12/10   EGD, positive H. pylori  . HSV infection    History  . Hyperlipidemia    ? no meds - diet controlled  . Post-operative nausea and vomiting   . Seasonal allergies     Past Surgical History:  Procedure Laterality Date  . ABDOMINAL HYSTERECTOMY    . BALLOON DILATION N/A 07/24/2012   Procedure: BALLOON DILATION;  Surgeon: Inda Castle, MD;  Location: [facility];  Service: Endoscopy;  Laterality: N/A;  . BRAVO [place] STUDY N/A 07/24/2012   Procedure: BRAVO [place] STUDY;  Surgeon: Inda Castle, MD;  [facility]: [facility];  Service: Endoscopy;  Laterality: N/A;  . DILATION AND CURETTAGE OF UTERUS     SAB  . ESOPHAGOGASTRODUODENOSCOPY N/A 07/24/2012   Procedure: ESOPHAGOGASTRODUODENOSCOPY (EGD);  Surgeon: Inda Castle, MD;  Location: [facility];  Service: Endoscopy;  Laterality: N/A;  . OVARIAN CYST REMOVAL  2004   laparotomy -left  . ROBOTIC ASSISTED LAPAROSCOPIC LYSIS OF ADHESION N/A 03/21/2014   Procedure: ROBOTIC ASSISTED LAPAROSCOPIC EXTENSIVE LYSIS OF ADHESION (1 Hour);  Surgeon: Marvene Staff, MD;  Location: [facility];  Service: Gynecology;  Laterality: N/A;  . ROBOTIC ASSISTED SALPINGO OOPHERECTOMY Left 03/21/2014   Procedure:  ROBOTIC ASSISTED LEFT OOPHORECTOMY;  Surgeon: Marvene Staff, MD;  Location: [facility];  Service: Gynecology;  Laterality: Left;  . SHOULDER ARTHROSCOPY Left   . TENNIS ELBOW RELEASE/NIRSCHEL PROCEDURE Right 05/01/2015   Procedure: RIGHT ELBOW DEBRIDEMENT AND TENDON REPAIR;  Surgeon: [name], MD;  Location: [place];  Service: Orthopedics;  Laterality: Right;  . WISDOM TOOTH EXTRACTION      There were no vitals filed for this visit.      Subjective Assessment - 04/28/16 1616    Subjective No new complaints.    Pertinent History s/p RTC repair   Limitations Lifting;House hold activities   Patient Stated Goals pt would like to recover from RTC surgery with normal range/strength of RUE so she can get back to independence/daily tasks   Currently in Pain? Yes                                      PT Long Term Goals - 03/04/16 1359      PT LONG TERM GOAL #1   Title Pt will be able to achieve full R elbow flexion/extension with <2/10 pain to promote return to PLOF   Baseline R  elbow AROM WNL but slight pain.   Time 4   Period Weeks   Status Partially Met     PT LONG TERM GOAL #2   Title Pt will improve self-perceived QuickDash score to <75% to promote increase in functional mobility so she can perform household tasks.   Baseline 11/30: 75% QuickDASH   Time 4   Period Weeks   Status Partially Met     PT LONG TERM GOAL #3   Title Pt will increase R shoulder flexion/abduction AROM to >140, >161 respectively so that she can perform overhead lifting tasks    Time 8   Period Weeks   Status Partially Met     PT LONG TERM GOAL #4   Title Pt will demonstrate equivalent R UE strength as compared to L UE strength to promote return to PLOF so she can perform work tasks   Time 12   Period Weeks   Status On-going      PT LONG TERM GOAL #5   Title Pt will report <2/10 resting pain for R shoulder to promote pain free function so she can sleep through the night   Time 4   Period Weeks   Status Partially Met               Plan - 04/28/16 1617    Rehab Potential Good   Clinical Impairments Affecting Rehab Potential Positive: Age. Motivation. Active lifestyle.   PT Frequency 2x / week   PT Duration 4 weeks   PT Treatment/Interventions ADLs/Self Care Home Management;Biofeedback;Cryotherapy;Electrical Stimulation;Moist Heat;Functional mobility training;Therapeutic exercise;Patient/family education;Manual techniques;Scar mobilization;Passive range of motion   PT Next Visit Plan Progress R shoulder AROM and stability ex.  Follow MD protocol.     PT Home Exercise Plan see pt instructions for detailed HEP program   Consulted and Agree with Plan of Care Patient      Patient will benefit from skilled therapeutic intervention in order to improve the following deficits and impairments:  Decreased activity tolerance, Decreased mobility, Decreased range of motion, Decreased scar mobility, Hypomobility, Impaired flexibility, Impaired UE functional use, Pain, Improper body mechanics  Visit Diagnosis: Muscle weakness (generalized)  Stiffness of right shoulder, not elsewhere classified  Chronic right shoulder pain     Problem List Patient Active Problem List   Diagnosis Date Noted  . Neutropenia ([place]) 10/09/2015  . Health care maintenance 11/21/2014  . History of colonic polyps 11/21/2014  . Dysphagia 11/19/2014  . Trochanteric bursitis of left hip 10/08/2014  . URI (upper respiratory infection) 02/10/2014  . Pelvic pain in female 12/25/2013  . Personal history of ovarian cyst 12/25/2013  . Change in bowel movement 11/11/2013  . breast tenderness 09/29/2013  . Dysphagia, unspecified(787.20) 06/12/2012  . Barrett's esophagus 06/12/2012  . Environmental allergies 02/13/2012  . Enlarged thyroid  02/13/2012  . GERD (gastroesophageal reflux disease) 02/08/2012  . Leukopenia 02/08/2012  . Fatigue 02/08/2012  . Hypercholesterolemia 02/08/2012    Pura Spice 04/28/2016, 4:18 PM  [facility] [address] [place], [place], [address] Phone: [phone]   Fax:  [phone]  Name: Kelly Massey MRN: [id number] Date of Birth: [birth year]

## 2016-04-29 ENCOUNTER — Ambulatory Visit: Payer: 59 | Admitting: Physical Therapy

## 2016-04-29 DIAGNOSIS — M25511 Pain in right shoulder: Secondary | ICD-10-CM

## 2016-04-29 DIAGNOSIS — M6281 Muscle weakness (generalized): Secondary | ICD-10-CM

## 2016-04-29 DIAGNOSIS — M25611 Stiffness of right shoulder, not elsewhere classified: Secondary | ICD-10-CM

## 2016-04-29 DIAGNOSIS — G8929 Other chronic pain: Secondary | ICD-10-CM

## 2016-04-30 ENCOUNTER — Encounter: Payer: Self-pay | Admitting: Physical Therapy

## 2016-04-30 NOTE — Therapy (Signed)
[place] [facility] [address] [place], [place], [address] Phone: [phone]   Fax:  [phone]  Physical Therapy Treatment  Patient Details  Name: Kelly Massey MRN: [id number] Date of Birth: 06/12/1965 Referring Provider: [name] MD  Encounter Date: 04/29/2016      PT End of Session - 04/30/16 1543    Visit Number 17   Number of Visits 20   Date for PT Re-Evaluation 05/06/16   PT Start Time 4196   PT Stop Time 1123   PT Time Calculation (min) 54 min   Activity Tolerance Patient tolerated treatment well;Patient limited by pain;Patient limited by fatigue   Behavior During Therapy [facility] for tasks assessed/performed      Past Medical History:  Diagnosis Date  . Allergy   . Anemia   . Arthritis    knees - no meds  . Asthma   . Colon polyps   . GERD (gastroesophageal reflux disease) 10/12/10   EGD, positive H. pylori  . HSV infection    History  . Hyperlipidemia    ? no meds - diet controlled  . Post-operative nausea and vomiting   . Seasonal allergies     Past Surgical History:  Procedure Laterality Date  . ABDOMINAL HYSTERECTOMY    . BALLOON DILATION N/A 07/24/2012   Procedure: BALLOON DILATION;  Surgeon: Inda Castle, MD;  Location: [facility];  Service: Endoscopy;  Laterality: N/A;  . BRAVO [place] STUDY N/A 07/24/2012   Procedure: BRAVO [place] STUDY;  Surgeon: Inda Castle, MD;  [facility];  Service: Endoscopy;  Laterality: N/A;  . DILATION AND CURETTAGE OF UTERUS     SAB  . ESOPHAGOGASTRODUODENOSCOPY N/A 07/24/2012   Procedure: ESOPHAGOGASTRODUODENOSCOPY (EGD);  Surgeon: Inda Castle, MD;  Location: [facility];  Service: Endoscopy;  Laterality: N/A;  . OVARIAN CYST REMOVAL  2004   laparotomy -left  . ROBOTIC ASSISTED LAPAROSCOPIC LYSIS OF ADHESION N/A 03/21/2014   Procedure: ROBOTIC ASSISTED LAPAROSCOPIC EXTENSIVE LYSIS OF ADHESION (1 Hour);  Surgeon: Marvene Staff, MD;  Location: [facility];  Service: Gynecology;  Laterality: N/A;  . ROBOTIC ASSISTED SALPINGO OOPHERECTOMY Left 03/21/2014   Procedure:  ROBOTIC ASSISTED LEFT OOPHORECTOMY;  Surgeon: Marvene Staff, MD;  Location: [facility];  Service: Gynecology;  Laterality: Left;  . SHOULDER ARTHROSCOPY Left   . TENNIS ELBOW RELEASE/NIRSCHEL PROCEDURE Right 05/01/2015   Procedure: RIGHT ELBOW DEBRIDEMENT AND TENDON REPAIR;  Surgeon: [name], MD;  Location: [facility];  Service: Orthopedics;  Laterality: Right;  . WISDOM TOOTH EXTRACTION      There were no vitals filed for this visit.      Subjective Assessment - 04/30/16 1543    Subjective Pt. states she is doing well.     Pertinent History s/p RTC repair   Limitations Lifting;House hold activities   Patient Stated Goals pt would like to recover from RTC surgery with normal range/strength of RUE so she can get back to independence/daily tasks   Currently in Pain? No/denies                                      PT Long Term Goals - 03/04/16 1359      PT LONG TERM GOAL #1   Title Pt will be able to achieve full R elbow flexion/extension with <2/10 pain to promote return to PLOF  Baseline R elbow AROM WNL but slight pain.   Time 4   Period Weeks   Status Partially Met     PT LONG TERM GOAL #2   Title Pt will improve self-perceived QuickDash score to <75% to promote increase in functional mobility so she can perform household tasks.   Baseline 11/30: 75% QuickDASH   Time 4   Period Weeks   Status Partially Met     PT LONG TERM GOAL #3   Title Pt will increase R shoulder flexion/abduction AROM to >140, >161 respectively so that she can perform overhead lifting tasks    Time 8   Period Weeks   Status Partially Met     PT LONG TERM GOAL #4   Title Pt will demonstrate equivalent R UE strength as compared to L UE strength to promote return to PLOF so she can perform work tasks   Time 12   Period Weeks    Status On-going     PT LONG TERM GOAL #5   Title Pt will report <2/10 resting pain for R shoulder to promote pain free function so she can sleep through the night   Time 4   Period Weeks   Status Partially Met               Plan - 04/30/16 1544    Rehab Potential Good   Clinical Impairments Affecting Rehab Potential Positive: Age. Motivation. Active lifestyle.   PT Frequency 2x / week   PT Duration 4 weeks   PT Treatment/Interventions ADLs/Self Care Home Management;Biofeedback;Cryotherapy;Electrical Stimulation;Moist Heat;Functional mobility training;Therapeutic exercise;Patient/family education;Manual techniques;Scar mobilization;Passive range of motion   PT Next Visit Plan Progress R shoulder AROM and stability ex.  Follow MD protocol.     PT Home Exercise Plan see pt instructions for detailed HEP program   Consulted and Agree with Plan of Care Patient      Patient will benefit from skilled therapeutic intervention in order to improve the following deficits and impairments:  Decreased activity tolerance, Decreased mobility, Decreased range of motion, Decreased scar mobility, Hypomobility, Impaired flexibility, Impaired UE functional use, Pain, Improper body mechanics  Visit Diagnosis: Muscle weakness (generalized)  Stiffness of right shoulder, not elsewhere classified  Chronic right shoulder pain     Problem List Patient Active Problem List   Diagnosis Date Noted  . Neutropenia ([place]) 10/09/2015  . Health care maintenance 11/21/2014  . History of colonic polyps 11/21/2014  . Dysphagia 11/19/2014  . Trochanteric bursitis of left hip 10/08/2014  . URI (upper respiratory infection) 02/10/2014  . Pelvic pain in female 12/25/2013  . Personal history of ovarian cyst 12/25/2013  . Change in bowel movement 11/11/2013  . breast tenderness 09/29/2013  . Dysphagia, unspecified(787.20) 06/12/2012  . Barrett's esophagus 06/12/2012  . Environmental allergies 02/13/2012  .  Enlarged thyroid 02/13/2012  . GERD (gastroesophageal reflux disease) 02/08/2012  . Leukopenia 02/08/2012  . Fatigue 02/08/2012  . Hypercholesterolemia 02/08/2012    Pura Spice 04/30/2016, 3:45 PM   [facility] [address] [place], [place], [address] Phone: [phone]   Fax:  [phone]  Name: ERSA DELANEY MRN: [id number] Date of Birth: September 27, 1965

## 2016-05-04 ENCOUNTER — Ambulatory Visit: Payer: 59 | Admitting: Physical Therapy

## 2016-05-04 DIAGNOSIS — M6281 Muscle weakness (generalized): Secondary | ICD-10-CM

## 2016-05-04 DIAGNOSIS — M25611 Stiffness of right shoulder, not elsewhere classified: Secondary | ICD-10-CM

## 2016-05-04 DIAGNOSIS — G8929 Other chronic pain: Secondary | ICD-10-CM

## 2016-05-04 DIAGNOSIS — M25511 Pain in right shoulder: Secondary | ICD-10-CM

## 2016-05-05 NOTE — Therapy (Signed)
[place] [facility] [address] [place], [place], [address] Phone: [phone]   Fax:  [phone]  Physical Therapy Treatment  Patient Details  Name: Kelly Massey MRN: [id number] Date of Birth: 01/27/1966 Referring Provider: [name] MD  Encounter Date: 05/04/2016      PT End of Session - 05/04/16 1134    Visit Number 18   Number of Visits 20   Date for PT Re-Evaluation 05/06/16   PT Start Time 0813   PT Stop Time 0920   PT Time Calculation (min) 67 min   Activity Tolerance Patient tolerated treatment well;Patient limited by pain;Patient limited by fatigue   Behavior During Therapy [facility] for tasks assessed/performed      Past Medical History:  Diagnosis Date  . Allergy   . Anemia   . Arthritis    knees - no meds  . Asthma   . Colon polyps   . GERD (gastroesophageal reflux disease) 10/12/10   EGD, positive H. pylori  . HSV infection    History  . Hyperlipidemia    ? no meds - diet controlled  . Post-operative nausea and vomiting   . Seasonal allergies     Past Surgical History:  Procedure Laterality Date  . ABDOMINAL HYSTERECTOMY    . BALLOON DILATION N/A 07/24/2012   Procedure: BALLOON DILATION;  Surgeon: Inda Castle, MD;  Location[facility];  Service: Endoscopy;  Laterality: N/A;  . BRAVO [place] STUDY N/A 07/24/2012   Procedure: BRAVO [place] STUDY;  Surgeon: Inda Castle, MD;  [facility];  Service: Endoscopy;  Laterality: N/A;  . DILATION AND CURETTAGE OF UTERUS     SAB  . ESOPHAGOGASTRODUODENOSCOPY N/A 07/24/2012   Procedure: ESOPHAGOGASTRODUODENOSCOPY (EGD);  Surgeon: Inda Castle, MD;  Location: [facility];  Service: Endoscopy;  Laterality: N/A;  . OVARIAN CYST REMOVAL  2004   laparotomy -left  . ROBOTIC ASSISTED LAPAROSCOPIC LYSIS OF ADHESION N/A 03/21/2014   Procedure: ROBOTIC ASSISTED LAPAROSCOPIC EXTENSIVE LYSIS OF ADHESION (1 Hour);  Surgeon: Marvene Staff, MD;  Location: [facility];  Service: Gynecology;  Laterality: N/A;  . ROBOTIC ASSISTED SALPINGO OOPHERECTOMY Left 03/21/2014   Procedure:  ROBOTIC ASSISTED LEFT OOPHORECTOMY;  Surgeon: Marvene Staff, MD;  Location: [facility];  Service: Gynecology;  Laterality: Left;  . SHOULDER ARTHROSCOPY Left   . TENNIS ELBOW RELEASE/NIRSCHEL PROCEDURE Right 05/01/2015   Procedure: RIGHT ELBOW DEBRIDEMENT AND TENDON REPAIR;  Surgeon: [name], MD;  Location: [facility];  Service: Orthopedics;  Laterality: Right;  . WISDOM TOOTH EXTRACTION      There were no vitals filed for this visit.                                    PT Long Term Goals - 03/04/16 1359      PT LONG TERM GOAL #1   Title Pt will be able to achieve full R elbow flexion/extension with <2/10 pain to promote return to PLOF   Baseline R elbow AROM WNL but slight pain.   Time 4   Period Weeks   Status Partially Met     PT LONG TERM GOAL #2   Title Pt will improve self-perceived QuickDash score to <75% to promote increase in functional mobility so she can perform household tasks.   Baseline 11/30: 75% QuickDASH   Time 4   Period Weeks  Status Partially Met     PT LONG TERM GOAL #3   Title Pt will increase R shoulder flexion/abduction AROM to >140, >161 respectively so that she can perform overhead lifting tasks    Time 8   Period Weeks   Status Partially Met     PT LONG TERM GOAL #4   Title Pt will demonstrate equivalent R UE strength as compared to L UE strength to promote return to PLOF so she can perform work tasks   Time 12   Period Weeks   Status On-going     PT LONG TERM GOAL #5   Title Pt will report <2/10 resting pain for R shoulder to promote pain free function so she can sleep through the night   Time 4   Period Weeks   Status Partially Met               Plan - 05/04/16 1136    Rehab Potential Good   Clinical Impairments Affecting Rehab Potential Positive: Age.  Motivation. Active lifestyle.   PT Frequency 2x / week   PT Duration 4 weeks   PT Treatment/Interventions ADLs/Self Care Home Management;Biofeedback;Cryotherapy;Electrical Stimulation;Moist Heat;Functional mobility training;Therapeutic exercise;Patient/family education;Manual techniques;Scar mobilization;Passive range of motion   PT Next Visit Plan Progress R shoulder AROM and stability ex.  Follow MD protocol.     PT Home Exercise Plan see pt instructions for detailed HEP program   Consulted and Agree with Plan of Care Patient      Patient will benefit from skilled therapeutic intervention in order to improve the following deficits and impairments:  Decreased activity tolerance, Decreased mobility, Decreased range of motion, Decreased scar mobility, Hypomobility, Impaired flexibility, Impaired UE functional use, Pain, Improper body mechanics  Visit Diagnosis: Muscle weakness (generalized)  Stiffness of right shoulder, not elsewhere classified  Chronic right shoulder pain     Problem List Patient Active Problem List   Diagnosis Date Noted  . Neutropenia ([place]) 10/09/2015  . Health care maintenance 11/21/2014  . History of colonic polyps 11/21/2014  . Dysphagia 11/19/2014  . Trochanteric bursitis of left hip 10/08/2014  . URI (upper respiratory infection) 02/10/2014  . Pelvic pain in female 12/25/2013  . Personal history of ovarian cyst 12/25/2013  . Change in bowel movement 11/11/2013  . breast tenderness 09/29/2013  . Dysphagia, unspecified(787.20) 06/12/2012  . Barrett's esophagus 06/12/2012  . Environmental allergies 02/13/2012  . Enlarged thyroid 02/13/2012  . GERD (gastroesophageal reflux disease) 02/08/2012  . Leukopenia 02/08/2012  . Fatigue 02/08/2012  . Hypercholesterolemia 02/08/2012    Pura Spice 05/05/2016, 5:24 PM  [facility] [address] [place], [place], [address] Phone: [phone]   Fax:   [phone]  Name: Kelly Massey MRN: [id number] Date of Birth: [birth year]

## 2016-05-06 ENCOUNTER — Ambulatory Visit: Payer: 59 | Attending: Orthopedic Surgery | Admitting: Physical Therapy

## 2016-05-06 DIAGNOSIS — G8929 Other chronic pain: Secondary | ICD-10-CM | POA: Insufficient documentation

## 2016-05-06 DIAGNOSIS — M25511 Pain in right shoulder: Secondary | ICD-10-CM | POA: Diagnosis not present

## 2016-05-06 DIAGNOSIS — M6281 Muscle weakness (generalized): Secondary | ICD-10-CM | POA: Insufficient documentation

## 2016-05-06 DIAGNOSIS — M25621 Stiffness of right elbow, not elsewhere classified: Secondary | ICD-10-CM | POA: Insufficient documentation

## 2016-05-06 DIAGNOSIS — M25611 Stiffness of right shoulder, not elsewhere classified: Secondary | ICD-10-CM | POA: Diagnosis not present

## 2016-05-07 NOTE — Therapy (Signed)
[facility] [address] [place], [place], [address] Phone: [phone]   Fax:  [phone]  Physical Therapy Treatment  Patient Details  Name: Kelly Massey MRN: [id number] Date of Birth: 09/01/1965 Referring Provider: Daniel F Murphy MD  Encounter Date: 05/06/2016      PT End of Session - 05/07/16 1619    Visit Number 19   Number of Visits 20   Date for PT Re-Evaluation 05/06/16   PT Start Time 1028   PT Stop Time 1127   PT Time Calculation (min) 59 min      Past Medical History:  Diagnosis Date  . Allergy   . Anemia   . Arthritis    knees - no meds  . Asthma   . Colon polyps   . GERD (gastroesophageal reflux disease) 10/12/10   EGD, positive H. pylori  . HSV infection    History  . Hyperlipidemia    ? no meds - diet controlled  . Post-operative nausea and vomiting   . Seasonal allergies     Past Surgical History:  Procedure Laterality Date  . ABDOMINAL HYSTERECTOMY    . BALLOON DILATION N/A 07/24/2012   Procedure: BALLOON DILATION;  Surgeon: Robert D Kaplan, MD;  [facility];  Service: Endoscopy;  Laterality: N/A;  . BRAVO [place] STUDY N/A 07/24/2012   Procedure: BRAVO [place] STUDY;  Surgeon: Robert D Kaplan, MD;  [facility];  Service: Endoscopy;  Laterality: N/A;  . DILATION AND CURETTAGE OF UTERUS     SAB  . ESOPHAGOGASTRODUODENOSCOPY N/A 07/24/2012   Procedure: ESOPHAGOGASTRODUODENOSCOPY (EGD);  Surgeon: Robert D Kaplan, MD;  [facility];  Service: Endoscopy;  Laterality: N/A;  . OVARIAN CYST REMOVAL  2004   laparotomy -left  . ROBOTIC ASSISTED LAPAROSCOPIC LYSIS OF ADHESION N/A 03/21/2014   Procedure: ROBOTIC ASSISTED LAPAROSCOPIC EXTENSIVE LYSIS OF ADHESION (1 Hour);  Surgeon: Sheronette A Cousins, MD;  Location: [facility];  Service: Gynecology;  Laterality: N/A;  . ROBOTIC ASSISTED SALPINGO OOPHERECTOMY Left 03/21/2014   Procedure:  ROBOTIC ASSISTED LEFT OOPHORECTOMY;  Surgeon:  Sheronette A Cousins, MD;  Location: [facility];  Service: Gynecology;  Laterality: Left;  . SHOULDER ARTHROSCOPY Left   . TENNIS ELBOW RELEASE/NIRSCHEL PROCEDURE Right 05/01/2015   Procedure: RIGHT ELBOW DEBRIDEMENT AND TENDON REPAIR;  Surgeon: Daniel F Murphy, MD;  Location: [facility];  Service: Orthopedics;  Laterality: Right;  . WISDOM TOOTH EXTRACTION      There were no vitals filed for this visit.      Subjective Assessment - 05/07/16 1618    Pertinent History s/p RTC repair   Limitations Lifting;House hold activities   Patient Stated Goals pt would like to recover from RTC surgery with normal range/strength of RUE so she can get back to independence/daily tasks   Currently in Pain? Yes   Pain Score 3                                       PT Long Term Goals - 03/04/16 1359      PT LONG TERM GOAL #1   Title Pt will be able to achieve full R elbow flexion/extension with <2/10 pain to promote return to PLOF   Baseline R elbow AROM WNL but slight pain.   Time 4   Period Weeks   Status Partially Met     PT   LONG TERM GOAL #2   Title Pt will improve self-perceived QuickDash score to <75% to promote increase in functional mobility so she can perform household tasks.   Baseline 11/30: 75% QuickDASH   Time 4   Period Weeks   Status Partially Met     PT LONG TERM GOAL #3   Title Pt will increase R shoulder flexion/abduction AROM to >140, >161 respectively so that she can perform overhead lifting tasks    Time 8   Period Weeks   Status Partially Met     PT LONG TERM GOAL #4   Title Pt will demonstrate equivalent R UE strength as compared to L UE strength to promote return to PLOF so she can perform work tasks   Time 12   Period Weeks   Status On-going     PT LONG TERM GOAL #5   Title Pt will report <2/10 resting pain for R shoulder to promote pain free function so she can sleep through the night   Time 4   Period Weeks   Status  Partially Met               Plan - 05/07/16 1619    Rehab Potential Good   Clinical Impairments Affecting Rehab Potential Positive: Age. Motivation. Active lifestyle.   PT Frequency 2x / week   PT Duration 4 weeks   PT Treatment/Interventions ADLs/Self Care Home Management;Biofeedback;Cryotherapy;Electrical Stimulation;Moist Heat;Functional mobility training;Therapeutic exercise;Patient/family education;Manual techniques;Scar mobilization;Passive range of motion   PT Next Visit Plan Progress R shoulder AROM and stability ex.  Follow MD protocol.     PT Home Exercise Plan see pt instructions for detailed HEP program   Consulted and Agree with Plan of Care Patient      Patient will benefit from skilled therapeutic intervention in order to improve the following deficits and impairments:  Decreased activity tolerance, Decreased mobility, Decreased range of motion, Decreased scar mobility, Hypomobility, Impaired flexibility, Impaired UE functional use, Pain, Improper body mechanics  Visit Diagnosis: Muscle weakness (generalized)  Stiffness of right shoulder, not elsewhere classified  Chronic right shoulder pain     Problem List Patient Active Problem List   Diagnosis Date Noted  . Neutropenia ([facility]) 10/09/2015  . Health care maintenance 11/21/2014  . History of colonic polyps 11/21/2014  . Dysphagia 11/19/2014  . Trochanteric bursitis of left hip 10/08/2014  . URI (upper respiratory infection) 02/10/2014  . Pelvic pain in female 12/25/2013  . Personal history of ovarian cyst 12/25/2013  . Change in bowel movement 11/11/2013  . breast tenderness 09/29/2013  . Dysphagia, unspecified(787.20) 06/12/2012  . Barrett's esophagus 06/12/2012  . Environmental allergies 02/13/2012  . Enlarged thyroid 02/13/2012  . GERD (gastroesophageal reflux disease) 02/08/2012  . Leukopenia 02/08/2012  . Fatigue 02/08/2012  . Hypercholesterolemia 02/08/2012    Sherk, Michael C 05/07/2016,  4:21 PM  [facility] [address] [place], [place], [address] Phone: [phone]   Fax:  [phone]  Name: Mackenzi H Belter MRN: [id number] Date of Birth: 01/07/1966   

## 2016-05-11 ENCOUNTER — Ambulatory Visit: Payer: 59 | Admitting: Physical Therapy

## 2016-05-11 DIAGNOSIS — M25511 Pain in right shoulder: Secondary | ICD-10-CM

## 2016-05-11 DIAGNOSIS — G8929 Other chronic pain: Secondary | ICD-10-CM

## 2016-05-11 DIAGNOSIS — M6281 Muscle weakness (generalized): Secondary | ICD-10-CM

## 2016-05-11 DIAGNOSIS — M25611 Stiffness of right shoulder, not elsewhere classified: Secondary | ICD-10-CM

## 2016-05-12 NOTE — Therapy (Signed)
[facility] [address] [place], [place], [address] Phone: [phone]   Fax:  [phone]  Physical Therapy Treatment  Patient Details  Name: Kelly Massey MRN: [id number] Date of Birth: [birth year] Referring Provider: [name] MD  Encounter Date: 05/11/2016      PT End of Session - 05/12/16 1310    Visit Number 20   Activity Tolerance Patient tolerated treatment well;Patient limited by pain;Patient limited by fatigue   Behavior During Therapy [facility] for tasks assessed/performed      Past Medical History:  Diagnosis Date  . Allergy   . Anemia   . Arthritis    knees - no meds  . Asthma   . Colon polyps   . GERD (gastroesophageal reflux disease) 10/12/10   EGD, positive H. pylori  . HSV infection    History  . Hyperlipidemia    ? no meds - diet controlled  . Post-operative nausea and vomiting   . Seasonal allergies     Past Surgical History:  Procedure Laterality Date  . ABDOMINAL HYSTERECTOMY    . BALLOON DILATION N/A 07/24/2012   Procedure: BALLOON DILATION;  Surgeon: Inda Castle, MD;  Location: [facility];  Service: Endoscopy;  Laterality: N/A;  . BRAVO [place] STUDY N/A 07/24/2012   Procedure: BRAVO [place] STUDY;  Surgeon: Inda Castle, MD;  [facility];  Service: Endoscopy;  Laterality: N/A;  . DILATION AND CURETTAGE OF UTERUS     SAB  . ESOPHAGOGASTRODUODENOSCOPY N/A 07/24/2012   Procedure: ESOPHAGOGASTRODUODENOSCOPY (EGD);  Surgeon: Inda Castle, MD;  Location[facility];  Service: Endoscopy;  Laterality: N/A;  . OVARIAN CYST REMOVAL  2004   laparotomy -left  . ROBOTIC ASSISTED LAPAROSCOPIC LYSIS OF ADHESION N/A 03/21/2014   Procedure: ROBOTIC ASSISTED LAPAROSCOPIC EXTENSIVE LYSIS OF ADHESION (1 Hour);  Surgeon: Marvene Staff, MD;  Location: [facility];  Service: Gynecology;  Laterality: N/A;  . ROBOTIC ASSISTED SALPINGO OOPHERECTOMY Left 03/21/2014   Procedure:  ROBOTIC ASSISTED LEFT  OOPHORECTOMY;  Surgeon: Marvene Staff, MD;  Location: [facility];  Service: Gynecology;  Laterality: Left;  . SHOULDER ARTHROSCOPY Left   . TENNIS ELBOW RELEASE/NIRSCHEL PROCEDURE Right 05/01/2015   Procedure: RIGHT ELBOW DEBRIDEMENT AND TENDON REPAIR;  Surgeon: [name], MD;  Location: [facility];  Service: Orthopedics;  Laterality: Right;  . WISDOM TOOTH EXTRACTION      There were no vitals filed for this visit.      Subjective Assessment - 05/12/16 1310    Pertinent History s/p RTC repair   Limitations Lifting;House hold activities   Patient Stated Goals pt would like to recover from RTC surgery with normal range/strength of RUE so she can get back to independence/daily tasks   Currently in Pain? Yes                                      PT Long Term Goals - 03/04/16 1359      PT LONG TERM GOAL #1   Title Pt will be able to achieve full R elbow flexion/extension with <2/10 pain to promote return to PLOF   Baseline R elbow AROM WNL but slight pain.   Time 4   Period Weeks   Status Partially Met     PT LONG TERM GOAL #2   Title Pt will improve self-perceived QuickDash score to <75% to  promote increase in functional mobility so she can perform household tasks.   Baseline 11/30: 75% QuickDASH   Time 4   Period Weeks   Status Partially Met     PT LONG TERM GOAL #3   Title Pt will increase R shoulder flexion/abduction AROM to >140, >161 respectively so that she can perform overhead lifting tasks    Time 8   Period Weeks   Status Partially Met     PT LONG TERM GOAL #4   Title Pt will demonstrate equivalent R UE strength as compared to L UE strength to promote return to PLOF so she can perform work tasks   Time 12   Period Weeks   Status On-going     PT LONG TERM GOAL #5   Title Pt will report <2/10 resting pain for R shoulder to promote pain free function so she can sleep through the night   Time 4   Period Weeks    Status Partially Met             Patient will benefit from skilled therapeutic intervention in order to improve the following deficits and impairments:     Visit Diagnosis: Muscle weakness (generalized)  Stiffness of right shoulder, not elsewhere classified  Chronic right shoulder pain     Problem List Patient Active Problem List   Diagnosis Date Noted  . Neutropenia ([place]) 10/09/2015  . Health care maintenance 11/21/2014  . History of colonic polyps 11/21/2014  . Dysphagia 11/19/2014  . Trochanteric bursitis of left hip 10/08/2014  . URI (upper respiratory infection) 02/10/2014  . Pelvic pain in female 12/25/2013  . Personal history of ovarian cyst 12/25/2013  . Change in bowel movement 11/11/2013  . breast tenderness 09/29/2013  . Dysphagia, unspecified(787.20) 06/12/2012  . Barrett's esophagus 06/12/2012  . Environmental allergies 02/13/2012  . Enlarged thyroid 02/13/2012  . GERD (gastroesophageal reflux disease) 02/08/2012  . Leukopenia 02/08/2012  . Fatigue 02/08/2012  . Hypercholesterolemia 02/08/2012    Pura Spice 05/12/2016, 1:11 PM  [facility] [address] [place], [place], [address] Phone: [phone]   Fax:  [phone]  Name: Kelly Massey MRN: [id number] Date of Birth: 1965/07/19

## 2016-05-13 ENCOUNTER — Ambulatory Visit: Payer: 59 | Admitting: Physical Therapy

## 2016-05-13 DIAGNOSIS — M6281 Muscle weakness (generalized): Secondary | ICD-10-CM | POA: Diagnosis not present

## 2016-05-13 DIAGNOSIS — M25611 Stiffness of right shoulder, not elsewhere classified: Secondary | ICD-10-CM

## 2016-05-13 DIAGNOSIS — G8929 Other chronic pain: Secondary | ICD-10-CM

## 2016-05-13 DIAGNOSIS — M25511 Pain in right shoulder: Secondary | ICD-10-CM

## 2016-05-13 DIAGNOSIS — M25621 Stiffness of right elbow, not elsewhere classified: Secondary | ICD-10-CM

## 2016-05-14 NOTE — Therapy (Signed)
[facility] [address] [place], [place], [address] Phone: [phone]   Fax:  [phone]  Physical Therapy Treatment  Patient Details  Name: Kelly Massey MRN: [id number] Date of Birth: 29-Oct-1965 Referring Provider: [name] MD  Encounter Date: 05/13/2016      PT End of Session - 05/14/16 1033    Visit Number 21   PT Start Time 1000   PT Stop Time 6063   PT Time Calculation (min) 54 min   Activity Tolerance Patient tolerated treatment well;Patient limited by pain;Patient limited by fatigue   Behavior During Therapy [facility] for tasks assessed/performed      Past Medical History:  Diagnosis Date  . Allergy   . Anemia   . Arthritis    knees - no meds  . Asthma   . Colon polyps   . GERD (gastroesophageal reflux disease) 10/12/10   EGD, positive H. pylori  . HSV infection    History  . Hyperlipidemia    ? no meds - diet controlled  . Post-operative nausea and vomiting   . Seasonal allergies     Past Surgical History:  Procedure Laterality Date  . ABDOMINAL HYSTERECTOMY    . BALLOON DILATION N/A 07/24/2012   Procedure: BALLOON DILATION;  Surgeon: Inda Castle, MD;  Location[facility];  Service: Endoscopy;  Laterality: N/A;  . BRAVO [place] STUDY N/A 07/24/2012   Procedure: BRAVO [place] STUDY;  Surgeon: Inda Castle, MD;  [facility];  Service: Endoscopy;  Laterality: N/A;  . DILATION AND CURETTAGE OF UTERUS     SAB  . ESOPHAGOGASTRODUODENOSCOPY N/A 07/24/2012   Procedure: ESOPHAGOGASTRODUODENOSCOPY (EGD);  Surgeon: Inda Castle, MD;  Location[facility];  Service: Endoscopy;  Laterality: N/A;  . OVARIAN CYST REMOVAL  2004   laparotomy -left  . ROBOTIC ASSISTED LAPAROSCOPIC LYSIS OF ADHESION N/A 03/21/2014   Procedure: ROBOTIC ASSISTED LAPAROSCOPIC EXTENSIVE LYSIS OF ADHESION (1 Hour);  Surgeon: Marvene Staff, MD;  Location: [facility];  Service: Gynecology;  Laterality: N/A;  . ROBOTIC ASSISTED  SALPINGO OOPHERECTOMY Left 03/21/2014   Procedure:  ROBOTIC ASSISTED LEFT OOPHORECTOMY;  Surgeon: Marvene Staff, MD;  Location: [facility];  Service: Gynecology;  Laterality: Left;  . SHOULDER ARTHROSCOPY Left   . TENNIS ELBOW RELEASE/NIRSCHEL PROCEDURE Right 05/01/2015   Procedure: RIGHT ELBOW DEBRIDEMENT AND TENDON REPAIR;  Surgeon: [name], MD;  Location: [facility];  Service: Orthopedics;  Laterality: Right;  . WISDOM TOOTH EXTRACTION      There were no vitals filed for this visit.      Subjective Assessment - 05/14/16 1032    Pertinent History s/p RTC repair   Limitations Lifting;House hold activities   Patient Stated Goals pt would like to recover from RTC surgery with normal range/strength of RUE so she can get back to independence/daily tasks   Currently in Pain? Yes                                      PT Long Term Goals - 03/04/16 1359      PT LONG TERM GOAL #1   Title Pt will be able to achieve full R elbow flexion/extension with <2/10 pain to promote return to PLOF   Baseline R elbow AROM WNL but slight pain.   Time 4   Period Weeks   Status Partially Met  PT LONG TERM GOAL #2   Title Pt will improve self-perceived QuickDash score to <75% to promote increase in functional mobility so she can perform household tasks.   Baseline 11/30: 75% QuickDASH   Time 4   Period Weeks   Status Partially Met     PT LONG TERM GOAL #3   Title Pt will increase R shoulder flexion/abduction AROM to >140, >161 respectively so that she can perform overhead lifting tasks    Time 8   Period Weeks   Status Partially Met     PT LONG TERM GOAL #4   Title Pt will demonstrate equivalent R UE strength as compared to L UE strength to promote return to PLOF so she can perform work tasks   Time 12   Period Weeks   Status On-going     PT LONG TERM GOAL #5   Title Pt will report <2/10 resting pain for R shoulder to promote pain free  function so she can sleep through the night   Time 4   Period Weeks   Status Partially Met               Plan - 05/14/16 1033    Rehab Potential Good   Clinical Impairments Affecting Rehab Potential Positive: Age. Motivation. Active lifestyle.   PT Frequency 2x / week   PT Duration 4 weeks   PT Treatment/Interventions ADLs/Self Care Home Management;Biofeedback;Cryotherapy;Electrical Stimulation;Moist Heat;Functional mobility training;Therapeutic exercise;Patient/family education;Manual techniques;Scar mobilization;Passive range of motion   PT Next Visit Plan Progress R shoulder AROM and stability ex.  Follow MD protocol.     PT Home Exercise Plan see pt instructions for detailed HEP program   Consulted and Agree with Plan of Care Patient      Patient will benefit from skilled therapeutic intervention in order to improve the following deficits and impairments:  Decreased activity tolerance, Decreased mobility, Decreased range of motion, Decreased scar mobility, Hypomobility, Impaired flexibility, Impaired UE functional use, Pain, Improper body mechanics  Visit Diagnosis: Muscle weakness (generalized)  Stiffness of right shoulder, not elsewhere classified  Chronic right shoulder pain  Joint stiffness of elbow, right     Problem List Patient Active Problem List   Diagnosis Date Noted  . Neutropenia ([place]) 10/09/2015  . Health care maintenance 11/21/2014  . History of colonic polyps 11/21/2014  . Dysphagia 11/19/2014  . Trochanteric bursitis of left hip 10/08/2014  . URI (upper respiratory infection) 02/10/2014  . Pelvic pain in female 12/25/2013  . Personal history of ovarian cyst 12/25/2013  . Change in bowel movement 11/11/2013  . breast tenderness 09/29/2013  . Dysphagia, unspecified(787.20) 06/12/2012  . Barrett's esophagus 06/12/2012  . Environmental allergies 02/13/2012  . Enlarged thyroid 02/13/2012  . GERD (gastroesophageal reflux disease) 02/08/2012  .  Leukopenia 02/08/2012  . Fatigue 02/08/2012  . Hypercholesterolemia 02/08/2012    Pura Spice 05/14/2016, 10:34 AM  [facility] [address] [place], [place], [address] Phone: [phone]   Fax:  [phone]  Name: Kelly Massey MRN: [id number] Date of Birth: [birth year]

## 2016-05-18 ENCOUNTER — Ambulatory Visit: Payer: 59 | Admitting: Physical Therapy

## 2016-05-18 DIAGNOSIS — M25611 Stiffness of right shoulder, not elsewhere classified: Secondary | ICD-10-CM

## 2016-05-18 DIAGNOSIS — M6281 Muscle weakness (generalized): Secondary | ICD-10-CM | POA: Diagnosis not present

## 2016-05-18 DIAGNOSIS — M25621 Stiffness of right elbow, not elsewhere classified: Secondary | ICD-10-CM

## 2016-05-18 DIAGNOSIS — G8929 Other chronic pain: Secondary | ICD-10-CM

## 2016-05-18 DIAGNOSIS — M25511 Pain in right shoulder: Secondary | ICD-10-CM

## 2016-05-19 NOTE — Therapy (Signed)
[facility] [address]. [place], [place], [address] Phone: [phone]   Fax:  [phone]  Physical Therapy Treatment  Patient Details  Name: Kelly Massey MRN: [id number] Date of Birth: 1965/08/06 Referring Provider: [name] MD  Encounter Date: 05/18/2016    Past Medical History:  Diagnosis Date  . Allergy   . Anemia   . Arthritis    knees - no meds  . Asthma   . Colon polyps   . GERD (gastroesophageal reflux disease) 10/12/10   EGD, positive H. pylori  . HSV infection    History  . Hyperlipidemia    ? no meds - diet controlled  . Post-operative nausea and vomiting   . Seasonal allergies     Past Surgical History:  Procedure Laterality Date  . ABDOMINAL HYSTERECTOMY    . BALLOON DILATION N/A 07/24/2012   Procedure: BALLOON DILATION;  Surgeon: Inda Castle, MD;  Location: [facility];  Service: Endoscopy;  Laterality: N/A;  . BRAVO [place] STUDY N/A 07/24/2012   Procedure: BRAVO [place] STUDY;  Surgeon: Inda Castle, MD;  [facility];  Service: Endoscopy;  Laterality: N/A;  . DILATION AND CURETTAGE OF UTERUS     SAB  . ESOPHAGOGASTRODUODENOSCOPY N/A 07/24/2012   Procedure: ESOPHAGOGASTRODUODENOSCOPY (EGD);  Surgeon: Inda Castle, MD;  Location: [facility];  Service: Endoscopy;  Laterality: N/A;  . OVARIAN CYST REMOVAL  2004   laparotomy -left  . ROBOTIC ASSISTED LAPAROSCOPIC LYSIS OF ADHESION N/A 03/21/2014   Procedure: ROBOTIC ASSISTED LAPAROSCOPIC EXTENSIVE LYSIS OF ADHESION (1 Hour);  Surgeon: Marvene Staff, MD;  Location: [facility];  Service: Gynecology;  Laterality: N/A;  . ROBOTIC ASSISTED SALPINGO OOPHERECTOMY Left 03/21/2014   Procedure:  ROBOTIC ASSISTED LEFT OOPHORECTOMY;  Surgeon: Marvene Staff, MD;  Location: [facility];  Service: Gynecology;  Laterality: Left;  . SHOULDER ARTHROSCOPY Left   . TENNIS ELBOW RELEASE/NIRSCHEL PROCEDURE Right 05/01/2015   Procedure: RIGHT ELBOW  DEBRIDEMENT AND TENDON REPAIR;  Surgeon: [name], MD;  Location: [facility];  Service: Orthopedics;  Laterality: Right;  . WISDOM TOOTH EXTRACTION      There were no vitals filed for this visit.      Subjective Assessment - 05/19/16 1432    Pertinent History s/p RTC repair   Limitations Lifting;House hold activities   Patient Stated Goals pt would like to recover from RTC surgery with normal range/strength of RUE so she can get back to independence/daily tasks   Currently in Pain? Yes                                      PT Long Term Goals - 03/04/16 1359      PT LONG TERM GOAL #1   Title Pt will be able to achieve full R elbow flexion/extension with <2/10 pain to promote return to PLOF   Baseline R elbow AROM WNL but slight pain.   Time 4   Period Weeks   Status Partially Met     PT LONG TERM GOAL #2   Title Pt will improve self-perceived QuickDash score to <75% to promote increase in functional mobility so she can perform household tasks.   Baseline 11/30: 75% QuickDASH   Time 4   Period Weeks   Status Partially Met     PT LONG TERM GOAL #3   Title Pt  will increase R shoulder flexion/abduction AROM to >140, >161 respectively so that she can perform overhead lifting tasks    Time 8   Period Weeks   Status Partially Met     PT LONG TERM GOAL #4   Title Pt will demonstrate equivalent R UE strength as compared to L UE strength to promote return to PLOF so she can perform work tasks   Time 12   Period Weeks   Status On-going     PT LONG TERM GOAL #5   Title Pt will report <2/10 resting pain for R shoulder to promote pain free function so she can sleep through the night   Time 4   Period Weeks   Status Partially Met             Patient will benefit from skilled therapeutic intervention in order to improve the following deficits and impairments:     Visit Diagnosis: Muscle weakness (generalized)  Stiffness  of right shoulder, not elsewhere classified  Chronic right shoulder pain  Joint stiffness of elbow, right     Problem List Patient Active Problem List   Diagnosis Date Noted  . Neutropenia ([place]) 10/09/2015  . Health care maintenance 11/21/2014  . History of colonic polyps 11/21/2014  . Dysphagia 11/19/2014  . Trochanteric bursitis of left hip 10/08/2014  . URI (upper respiratory infection) 02/10/2014  . Pelvic pain in female 12/25/2013  . Personal history of ovarian cyst 12/25/2013  . Change in bowel movement 11/11/2013  . breast tenderness 09/29/2013  . Dysphagia, unspecified(787.20) 06/12/2012  . Barrett's esophagus 06/12/2012  . Environmental allergies 02/13/2012  . Enlarged thyroid 02/13/2012  . GERD (gastroesophageal reflux disease) 02/08/2012  . Leukopenia 02/08/2012  . Fatigue 02/08/2012  . Hypercholesterolemia 02/08/2012    Pura Spice 05/19/2016, 2:33 PM  [facility] [address] [place], [place], [address] Phone: [phone]   Fax:  [phone]  Name: Kelly Massey MRN: [id number] Date of Birth: 30-Jul-1965

## 2016-05-20 ENCOUNTER — Ambulatory Visit: Payer: 59 | Admitting: Physical Therapy

## 2016-05-20 DIAGNOSIS — M25611 Stiffness of right shoulder, not elsewhere classified: Secondary | ICD-10-CM

## 2016-05-20 DIAGNOSIS — M6281 Muscle weakness (generalized): Secondary | ICD-10-CM

## 2016-05-20 DIAGNOSIS — M25621 Stiffness of right elbow, not elsewhere classified: Secondary | ICD-10-CM

## 2016-05-20 DIAGNOSIS — M25511 Pain in right shoulder: Secondary | ICD-10-CM

## 2016-05-20 DIAGNOSIS — G8929 Other chronic pain: Secondary | ICD-10-CM

## 2016-05-21 NOTE — Therapy (Signed)
[facility] [address] [place], [place], [address] Phone: [phone]   Fax:  [phone]  Physical Therapy Treatment  Patient Details  Name: Kelly Massey MRN: [id number] Date of Birth: Oct 04, 1965 Referring Provider: [name] MD  Encounter Date: 05/20/2016      PT End of Session - 05/21/16 1725    Visit Number 72      Past Medical History:  Diagnosis Date  . Allergy   . Anemia   . Arthritis    knees - no meds  . Asthma   . Colon polyps   . GERD (gastroesophageal reflux disease) 10/12/10   EGD, positive H. pylori  . HSV infection    History  . Hyperlipidemia    ? no meds - diet controlled  . Post-operative nausea and vomiting   . Seasonal allergies     Past Surgical History:  Procedure Laterality Date  . ABDOMINAL HYSTERECTOMY    . BALLOON DILATION N/A 07/24/2012   Procedure: BALLOON DILATION;  Surgeon: Inda Castle, MD;  Location: [facility];  Service: Endoscopy;  Laterality: N/A;  . BRAVO [place] STUDY N/A 07/24/2012   Procedure: BRAVO [place] STUDY;  Surgeon: Inda Castle, MD;  [facility];  Service: Endoscopy;  Laterality: N/A;  . DILATION AND CURETTAGE OF UTERUS     SAB  . ESOPHAGOGASTRODUODENOSCOPY N/A 07/24/2012   Procedure: ESOPHAGOGASTRODUODENOSCOPY (EGD);  Surgeon: Inda Castle, MD;  Location: [facility];  Service: Endoscopy;  Laterality: N/A;  . OVARIAN CYST REMOVAL  2004   laparotomy -left  . ROBOTIC ASSISTED LAPAROSCOPIC LYSIS OF ADHESION N/A 03/21/2014   Procedure: ROBOTIC ASSISTED LAPAROSCOPIC EXTENSIVE LYSIS OF ADHESION (1 Hour);  Surgeon: Marvene Staff, MD;  Location: [facility];  Service: Gynecology;  Laterality: N/A;  . ROBOTIC ASSISTED SALPINGO OOPHERECTOMY Left 03/21/2014   Procedure:  ROBOTIC ASSISTED LEFT OOPHORECTOMY;  Surgeon: Marvene Staff, MD;  Location: [facility];  Service: Gynecology;  Laterality: Left;  . SHOULDER ARTHROSCOPY Left   . TENNIS ELBOW  RELEASE/NIRSCHEL PROCEDURE Right 05/01/2015   Procedure: RIGHT ELBOW DEBRIDEMENT AND TENDON REPAIR;  Surgeon: [name], MD;  Location: [facility];  Service: Orthopedics;  Laterality: Right;  . WISDOM TOOTH EXTRACTION      There were no vitals filed for this visit.      Subjective Assessment - 05/21/16 1725    Subjective Pt. states R shoulder is stiff this morning and has a few funerals to go to today/this week   Pertinent History s/p RTC repair   Limitations Lifting;House hold activities   Patient Stated Goals pt would like to recover from RTC surgery with normal range/strength of RUE so she can get back to independence/daily tasks                                      PT Long Term Goals - 03/04/16 1359      PT LONG TERM GOAL #1   Title Pt will be able to achieve full R elbow flexion/extension with <2/10 pain to promote return to PLOF   Baseline R elbow AROM WNL but slight pain.   Time 4   Period Weeks   Status Partially Met     PT LONG TERM GOAL #2   Title Pt will improve self-perceived QuickDash score to <75% to promote increase in functional mobility so she can  perform household tasks.   Baseline 11/30: 75% QuickDASH   Time 4   Period Weeks   Status Partially Met     PT LONG TERM GOAL #3   Title Pt will increase R shoulder flexion/abduction AROM to >140, >161 respectively so that she can perform overhead lifting tasks    Time 8   Period Weeks   Status Partially Met     PT LONG TERM GOAL #4   Title Pt will demonstrate equivalent R UE strength as compared to L UE strength to promote return to PLOF so she can perform work tasks   Time 12   Period Weeks   Status On-going     PT LONG TERM GOAL #5   Title Pt will report <2/10 resting pain for R shoulder to promote pain free function so she can sleep through the night   Time 4   Period Weeks   Status Partially Met             Patient will benefit from skilled  therapeutic intervention in order to improve the following deficits and impairments:     Visit Diagnosis: Muscle weakness (generalized)  Stiffness of right shoulder, not elsewhere classified  Chronic right shoulder pain  Joint stiffness of elbow, right     Problem List Patient Active Problem List   Diagnosis Date Noted  . Neutropenia ([place]) 10/09/2015  . Health care maintenance 11/21/2014  . History of colonic polyps 11/21/2014  . Dysphagia 11/19/2014  . Trochanteric bursitis of left hip 10/08/2014  . URI (upper respiratory infection) 02/10/2014  . Pelvic pain in female 12/25/2013  . Personal history of ovarian cyst 12/25/2013  . Change in bowel movement 11/11/2013  . breast tenderness 09/29/2013  . Dysphagia, unspecified(787.20) 06/12/2012  . Barrett's esophagus 06/12/2012  . Environmental allergies 02/13/2012  . Enlarged thyroid 02/13/2012  . GERD (gastroesophageal reflux disease) 02/08/2012  . Leukopenia 02/08/2012  . Fatigue 02/08/2012  . Hypercholesterolemia 02/08/2012    Pura Spice 05/21/2016, 5:28 PM  [facility] [address] [place], [place], [address] Phone: [phone]   Fax:  [phone]  Name: Kelly Massey MRN: [id number] Date of Birth: 1966-03-07

## 2016-05-25 ENCOUNTER — Ambulatory Visit: Payer: 59 | Admitting: Physical Therapy

## 2016-05-25 ENCOUNTER — Encounter: Payer: 59 | Admitting: Physical Therapy

## 2016-05-25 DIAGNOSIS — M19011 Primary osteoarthritis, right shoulder: Secondary | ICD-10-CM | POA: Diagnosis not present

## 2016-05-27 ENCOUNTER — Ambulatory Visit: Payer: 59 | Admitting: Physical Therapy

## 2016-05-27 DIAGNOSIS — M6281 Muscle weakness (generalized): Secondary | ICD-10-CM | POA: Diagnosis not present

## 2016-05-27 DIAGNOSIS — G8929 Other chronic pain: Secondary | ICD-10-CM

## 2016-05-27 DIAGNOSIS — M25621 Stiffness of right elbow, not elsewhere classified: Secondary | ICD-10-CM

## 2016-05-27 DIAGNOSIS — M25611 Stiffness of right shoulder, not elsewhere classified: Secondary | ICD-10-CM

## 2016-05-27 DIAGNOSIS — M25511 Pain in right shoulder: Secondary | ICD-10-CM

## 2016-05-28 NOTE — Therapy (Signed)
[facility] [address] [place], [place], [address] Phone: [phone]   Fax:  [phone]  Physical Therapy Treatment  Patient Details  Name: Kelly Massey MRN: [id number] Date of Birth: 1965/05/17 Referring Provider: [name] MD  Encounter Date: 05/27/2016      PT End of Session - 05/28/16 1559    Visit Number 23      Past Medical History:  Diagnosis Date  . Allergy   . Anemia   . Arthritis    knees - no meds  . Asthma   . Colon polyps   . GERD (gastroesophageal reflux disease) 10/12/10   EGD, positive H. pylori  . HSV infection    History  . Hyperlipidemia    ? no meds - diet controlled  . Post-operative nausea and vomiting   . Seasonal allergies     Past Surgical History:  Procedure Laterality Date  . ABDOMINAL HYSTERECTOMY    . BALLOON DILATION N/A 07/24/2012   Procedure: BALLOON DILATION;  Surgeon: Inda Castle, MD;  Location: [facility];  Service: Endoscopy;  Laterality: N/A;  . BRAVO [place] STUDY N/A 07/24/2012   Procedure: BRAVO [place] STUDY;  Surgeon: Inda Castle, MD;  [facility];  Service: Endoscopy;  Laterality: N/A;  . DILATION AND CURETTAGE OF UTERUS     SAB  . ESOPHAGOGASTRODUODENOSCOPY N/A 07/24/2012   Procedure: ESOPHAGOGASTRODUODENOSCOPY (EGD);  Surgeon: Inda Castle, MD;  Location: [facility];  Service: Endoscopy;  Laterality: N/A;  . OVARIAN CYST REMOVAL  2004   laparotomy -left  . ROBOTIC ASSISTED LAPAROSCOPIC LYSIS OF ADHESION N/A 03/21/2014   Procedure: ROBOTIC ASSISTED LAPAROSCOPIC EXTENSIVE LYSIS OF ADHESION (1 Hour);  Surgeon: Marvene Staff, MD;  Location: [facility];  Service: Gynecology;  Laterality: N/A;  . ROBOTIC ASSISTED SALPINGO OOPHERECTOMY Left 03/21/2014   Procedure:  ROBOTIC ASSISTED LEFT OOPHORECTOMY;  Surgeon: Marvene Staff, MD;  Location: [facility];  Service: Gynecology;  Laterality: Left;  . SHOULDER ARTHROSCOPY Left   . TENNIS ELBOW  RELEASE/NIRSCHEL PROCEDURE Right 05/01/2015   Procedure: RIGHT ELBOW DEBRIDEMENT AND TENDON REPAIR;  Surgeon: [name], MD;  Location: [facility];  Service: Orthopedics;  Laterality: Right;  . WISDOM TOOTH EXTRACTION      There were no vitals filed for this visit.                                    PT Long Term Goals - 03/04/16 1359      PT LONG TERM GOAL #1   Title Pt will be able to achieve full R elbow flexion/extension with <2/10 pain to promote return to PLOF   Baseline R elbow AROM WNL but slight pain.   Time 4   Period Weeks   Status Partially Met     PT LONG TERM GOAL #2   Title Pt will improve self-perceived QuickDash score to <75% to promote increase in functional mobility so she can perform household tasks.   Baseline 11/30: 75% QuickDASH   Time 4   Period Weeks   Status Partially Met     PT LONG TERM GOAL #3   Title Pt will increase R shoulder flexion/abduction AROM to >140, >161 respectively so that she can perform overhead lifting tasks    Time 8   Period Weeks   Status Partially Met     PT LONG  TERM GOAL #4   Title Pt will demonstrate equivalent R UE strength as compared to L UE strength to promote return to PLOF so she can perform work tasks   Time 12   Period Weeks   Status On-going     PT LONG TERM GOAL #5   Title Pt will report <2/10 resting pain for R shoulder to promote pain free function so she can sleep through the night   Time 4   Period Weeks   Status Partially Met             Patient will benefit from skilled therapeutic intervention in order to improve the following deficits and impairments:     Visit Diagnosis: Muscle weakness (generalized)  Stiffness of right shoulder, not elsewhere classified  Chronic right shoulder pain  Joint stiffness of elbow, right     Problem List Patient Active Problem List   Diagnosis Date Noted  . Neutropenia ([place]) 10/09/2015  . Health care  maintenance 11/21/2014  . History of colonic polyps 11/21/2014  . Dysphagia 11/19/2014  . Trochanteric bursitis of left hip 10/08/2014  . URI (upper respiratory infection) 02/10/2014  . Pelvic pain in female 12/25/2013  . Personal history of ovarian cyst 12/25/2013  . Change in bowel movement 11/11/2013  . breast tenderness 09/29/2013  . Dysphagia, unspecified(787.20) 06/12/2012  . Barrett's esophagus 06/12/2012  . Environmental allergies 02/13/2012  . Enlarged thyroid 02/13/2012  . GERD (gastroesophageal reflux disease) 02/08/2012  . Leukopenia 02/08/2012  . Fatigue 02/08/2012  . Hypercholesterolemia 02/08/2012    Pura Spice 05/28/2016, 4:02 PM  [place] [address] [place], [place], [address] Phone: [phone]   Fax:  [phone]  Name: Kelly Massey MRN: [id number] Date of Birth: 1965-05-27

## 2016-06-01 ENCOUNTER — Ambulatory Visit: Payer: 59 | Admitting: Physical Therapy

## 2016-06-01 DIAGNOSIS — M25611 Stiffness of right shoulder, not elsewhere classified: Secondary | ICD-10-CM

## 2016-06-01 DIAGNOSIS — M6281 Muscle weakness (generalized): Secondary | ICD-10-CM | POA: Diagnosis not present

## 2016-06-01 DIAGNOSIS — G8929 Other chronic pain: Secondary | ICD-10-CM

## 2016-06-01 DIAGNOSIS — M25621 Stiffness of right elbow, not elsewhere classified: Secondary | ICD-10-CM

## 2016-06-01 DIAGNOSIS — M25511 Pain in right shoulder: Secondary | ICD-10-CM

## 2016-06-01 NOTE — Therapy (Signed)
Note opened in error.  Patient cancelled appt.    Pura Spice, PT, DPT # [id number]

## 2016-06-02 NOTE — Therapy (Signed)
[facility] [address] [place], [place], [address] Phone: [phone]   Fax:  [phone]  Physical Therapy Treatment  Patient Details  Name: Kelly Massey MRN: [id number] Date of Birth: [birth year] Referring Provider: [name] MD  Encounter Date: 06/01/2016      PT End of Session - 06/02/16 0731    Visit Number 24   PT Start Time 3845   PT Stop Time 1125   PT Time Calculation (min) 56 min   Activity Tolerance Patient tolerated treatment well;Patient limited by pain;Patient limited by fatigue   Behavior During Therapy [facility] for tasks assessed/performed      Past Medical History:  Diagnosis Date  . Allergy   . Anemia   . Arthritis    knees - no meds  . Asthma   . Colon polyps   . GERD (gastroesophageal reflux disease) 10/12/10   EGD, positive H. pylori  . HSV infection    History  . Hyperlipidemia    ? no meds - diet controlled  . Post-operative nausea and vomiting   . Seasonal allergies     Past Surgical History:  Procedure Laterality Date  . ABDOMINAL HYSTERECTOMY    . BALLOON DILATION N/A 07/24/2012   Procedure: BALLOON DILATION;  Surgeon: Inda Castle, MD;  Location: [facility];  Service: Endoscopy;  Laterality: N/A;  . BRAVO [place] STUDY N/A 07/24/2012   Procedure: BRAVO [place] STUDY;  Surgeon: Inda Castle, MD;  [facility];  Service: Endoscopy;  Laterality: N/A;  . DILATION AND CURETTAGE OF UTERUS     SAB  . ESOPHAGOGASTRODUODENOSCOPY N/A 07/24/2012   Procedure: ESOPHAGOGASTRODUODENOSCOPY (EGD);  Surgeon: Inda Castle, MD;  Location: [facility];  Service: Endoscopy;  Laterality: N/A;  . OVARIAN CYST REMOVAL  2004   laparotomy -left  . ROBOTIC ASSISTED LAPAROSCOPIC LYSIS OF ADHESION N/A 03/21/2014   Procedure: ROBOTIC ASSISTED LAPAROSCOPIC EXTENSIVE LYSIS OF ADHESION (1 Hour);  Surgeon: Marvene Staff, MD;  Location: [facility];  Service: Gynecology;  Laterality: N/A;  . ROBOTIC  ASSISTED SALPINGO OOPHERECTOMY Left 03/21/2014   Procedure:  ROBOTIC ASSISTED LEFT OOPHORECTOMY;  Surgeon: Marvene Staff, MD;  Location: [facility];  Service: Gynecology;  Laterality: Left;  . SHOULDER ARTHROSCOPY Left   . TENNIS ELBOW RELEASE/NIRSCHEL PROCEDURE Right 05/01/2015   Procedure: RIGHT ELBOW DEBRIDEMENT AND TENDON REPAIR;  Surgeon: [name], MD;  Location: [facility];  Service: Orthopedics;  Laterality: Right;  . WISDOM TOOTH EXTRACTION      There were no vitals filed for this visit.                                    PT Long Term Goals - 03/04/16 1359      PT LONG TERM GOAL #1   Title Pt will be able to achieve full R elbow flexion/extension with <2/10 pain to promote return to PLOF   Baseline R elbow AROM WNL but slight pain.   Time 4   Period Weeks   Status Partially Met     PT LONG TERM GOAL #2   Title Pt will improve self-perceived QuickDash score to <75% to promote increase in functional mobility so she can perform household tasks.   Baseline 11/30: 75% QuickDASH   Time 4   Period Weeks   Status Partially Met     PT LONG TERM GOAL #  3   Title Pt will increase R shoulder flexion/abduction AROM to >140, >161 respectively so that she can perform overhead lifting tasks    Time 8   Period Weeks   Status Partially Met     PT LONG TERM GOAL #4   Title Pt will demonstrate equivalent R UE strength as compared to L UE strength to promote return to PLOF so she can perform work tasks   Time 12   Period Weeks   Status On-going     PT LONG TERM GOAL #5   Title Pt will report <2/10 resting pain for R shoulder to promote pain free function so she can sleep through the night   Time 4   Period Weeks   Status Partially Met             Patient will benefit from skilled therapeutic intervention in order to improve the following deficits and impairments:     Visit Diagnosis: Muscle weakness  (generalized)  Stiffness of right shoulder, not elsewhere classified  Chronic right shoulder pain  Joint stiffness of elbow, right     Problem List Patient Active Problem List   Diagnosis Date Noted  . Neutropenia ([place]) 10/09/2015  . Health care maintenance 11/21/2014  . History of colonic polyps 11/21/2014  . Dysphagia 11/19/2014  . Trochanteric bursitis of left hip 10/08/2014  . URI (upper respiratory infection) 02/10/2014  . Pelvic pain in female 12/25/2013  . Personal history of ovarian cyst 12/25/2013  . Change in bowel movement 11/11/2013  . breast tenderness 09/29/2013  . Dysphagia, unspecified(787.20) 06/12/2012  . Barrett's esophagus 06/12/2012  . Environmental allergies 02/13/2012  . Enlarged thyroid 02/13/2012  . GERD (gastroesophageal reflux disease) 02/08/2012  . Leukopenia 02/08/2012  . Fatigue 02/08/2012  . Hypercholesterolemia 02/08/2012    Kelly Massey 06/02/2016, 7:33 AM  [facility] [address] [place], [place], [address] Phone: [phone]   Fax:  [phone]  Name: Kelly Massey MRN: [id number] Date of Birth: 1965/06/02

## 2016-06-03 ENCOUNTER — Ambulatory Visit: Payer: 59 | Attending: Orthopedic Surgery | Admitting: Physical Therapy

## 2016-06-03 DIAGNOSIS — M25611 Stiffness of right shoulder, not elsewhere classified: Secondary | ICD-10-CM | POA: Diagnosis not present

## 2016-06-03 DIAGNOSIS — G8929 Other chronic pain: Secondary | ICD-10-CM | POA: Insufficient documentation

## 2016-06-03 DIAGNOSIS — M25511 Pain in right shoulder: Secondary | ICD-10-CM | POA: Insufficient documentation

## 2016-06-03 DIAGNOSIS — M25621 Stiffness of right elbow, not elsewhere classified: Secondary | ICD-10-CM | POA: Diagnosis present

## 2016-06-03 DIAGNOSIS — M6281 Muscle weakness (generalized): Secondary | ICD-10-CM | POA: Insufficient documentation

## 2016-06-03 NOTE — Addendum Note (Signed)
Addended by: Dorcas Carrow C on: 06/03/2016 01:00 PM   Modules accepted: Orders

## 2016-06-04 NOTE — Therapy (Signed)
[place] [facility] [address] [place], [place], [address] Phone: [phone]   Fax:  [phone]  Physical Therapy Treatment  Patient Details  Name: QUINNE PIRES MRN: [id number] Date of Birth: 1966-01-11 Referring Provider: [name] MD  Encounter Date: 06/03/2016    Past Medical History:  Diagnosis Date  . Allergy   . Anemia   . Arthritis    knees - no meds  . Asthma   . Colon polyps   . GERD (gastroesophageal reflux disease) 10/12/10   EGD, positive H. pylori  . HSV infection    History  . Hyperlipidemia    ? no meds - diet controlled  . Post-operative nausea and vomiting   . Seasonal allergies     Past Surgical History:  Procedure Laterality Date  . ABDOMINAL HYSTERECTOMY    . BALLOON DILATION N/A 07/24/2012   Procedure: BALLOON DILATION;  Surgeon: Inda Castle, MD;  Location: [facility];  Service: Endoscopy;  Laterality: N/A;  . BRAVO [employer] STUDY N/A 07/24/2012   Procedure: BRAVO [place] STUDY;  Surgeon: Inda Castle, MD;  [facility];  Service: Endoscopy;  Laterality: N/A;  . DILATION AND CURETTAGE OF UTERUS     SAB  . ESOPHAGOGASTRODUODENOSCOPY N/A 07/24/2012   Procedure: ESOPHAGOGASTRODUODENOSCOPY (EGD);  Surgeon: Inda Castle, MD;  Location: [facility];  Service: Endoscopy;  Laterality: N/A;  . OVARIAN CYST REMOVAL  2004   laparotomy -left  . ROBOTIC ASSISTED LAPAROSCOPIC LYSIS OF ADHESION N/A 03/21/2014   Procedure: ROBOTIC ASSISTED LAPAROSCOPIC EXTENSIVE LYSIS OF ADHESION (1 Hour);  Surgeon: Marvene Staff, MD;  Location: [facility];  Service: Gynecology;  Laterality: N/A;  . ROBOTIC ASSISTED SALPINGO OOPHERECTOMY Left 03/21/2014   Procedure:  ROBOTIC ASSISTED LEFT OOPHORECTOMY;  Surgeon: Marvene Staff, MD;  Location: [facility];  Service: Gynecology;  Laterality: Left;  . SHOULDER ARTHROSCOPY Left   . TENNIS ELBOW RELEASE/NIRSCHEL PROCEDURE Right 05/01/2015   Procedure: RIGHT ELBOW  DEBRIDEMENT AND TENDON REPAIR;  Surgeon: [name], MD;  Location: [place];  Service: Orthopedics;  Laterality: Right;  . WISDOM TOOTH EXTRACTION      There were no vitals filed for this visit.                                    PT Long Term Goals - 04/09/16 1232      PT LONG TERM GOAL #1   Title Pt will be able to achieve full R elbow flexion/extension with <2/10 pain to promote return to PLOF   Baseline R elbow AROM WNL but slight pain.   Time 4   Period Weeks   Status Achieved     PT LONG TERM GOAL #2   Title Pt will improve self-perceived QuickDash score to <35% to promote increase in functional mobility so she can perform household tasks.   Baseline 04/08/16: 65.9% (slight improvement)   Time 4   Period Weeks   Status New     PT LONG TERM GOAL #3   Title Pt will increase R shoulder flexion/abduction AROM to >140, >161 respectively so that she can perform overhead lifting tasks    Time 4   Period Weeks   Status Not Met     PT LONG TERM GOAL #4   Title Pt will demonstrate equivalent R UE strength as compared to L UE strength to promote  return to PLOF so she can perform work tasks   Baseline R UE strength grossly 3/5 MMT (ROM limitations), LUE shoulder flexion 4+, abduction 4+, biceps/triceps 5   Time 4   Period Weeks   Status Not Met     PT LONG TERM GOAL #5   Title Pt will report <2/10 resting pain for R shoulder to promote pain free function so she can sleep through the night   Baseline pt with 5/10 resting R shoulder pain; increase R shoulder muscle soreness   Time 4   Period Weeks   Status Not Met             Patient will benefit from skilled therapeutic intervention in order to improve the following deficits and impairments:     Visit Diagnosis: Muscle weakness (generalized)  Stiffness of right shoulder, not elsewhere classified  Chronic right shoulder pain  Joint stiffness of elbow,  right     Problem List Patient Active Problem List   Diagnosis Date Noted  . Neutropenia ([place]) 10/09/2015  . Health care maintenance 11/21/2014  . History of colonic polyps 11/21/2014  . Dysphagia 11/19/2014  . Trochanteric bursitis of left hip 10/08/2014  . URI (upper respiratory infection) 02/10/2014  . Pelvic pain in female 12/25/2013  . Personal history of ovarian cyst 12/25/2013  . Change in bowel movement 11/11/2013  . breast tenderness 09/29/2013  . Dysphagia, unspecified(787.20) 06/12/2012  . Barrett's esophagus 06/12/2012  . Environmental allergies 02/13/2012  . Enlarged thyroid 02/13/2012  . GERD (gastroesophageal reflux disease) 02/08/2012  . Leukopenia 02/08/2012  . Fatigue 02/08/2012  . Hypercholesterolemia 02/08/2012    Pura Spice 06/04/2016, 6:43 PM  [place] [facility] [address] [place], [place], [address] Phone: [phone]   Fax:  [phone]  Name: JAYCELYN ORRISON MRN: [id number] Date of Birth: [birth year]

## 2016-06-10 ENCOUNTER — Ambulatory Visit: Payer: 59 | Admitting: Physical Therapy

## 2016-06-10 DIAGNOSIS — M25511 Pain in right shoulder: Secondary | ICD-10-CM

## 2016-06-10 DIAGNOSIS — M6281 Muscle weakness (generalized): Secondary | ICD-10-CM | POA: Diagnosis not present

## 2016-06-10 DIAGNOSIS — M25621 Stiffness of right elbow, not elsewhere classified: Secondary | ICD-10-CM

## 2016-06-10 DIAGNOSIS — M25611 Stiffness of right shoulder, not elsewhere classified: Secondary | ICD-10-CM

## 2016-06-10 DIAGNOSIS — G8929 Other chronic pain: Secondary | ICD-10-CM

## 2016-06-11 NOTE — Therapy (Signed)
[place] [facility] [address] [place], [place], [address] Phone: [phone]   Fax:  [phone]  Physical Therapy Treatment  Patient Details  Name: Kelly Massey MRN: [id number] Date of Birth: 1965-12-23 Referring Provider: [name] MD  Encounter Date: 06/10/2016    Past Medical History:  Diagnosis Date  . Allergy   . Anemia   . Arthritis    knees - no meds  . Asthma   . Colon polyps   . GERD (gastroesophageal reflux disease) 10/12/10   EGD, positive H. pylori  . HSV infection    History  . Hyperlipidemia    ? no meds - diet controlled  . Post-operative nausea and vomiting   . Seasonal allergies     Past Surgical History:  Procedure Laterality Date  . ABDOMINAL HYSTERECTOMY    . BALLOON DILATION N/A 07/24/2012   Procedure: BALLOON DILATION;  Surgeon: Inda Castle, MD;  Location: [facility];  Service: Endoscopy;  Laterality: N/A;  . BRAVO [place] STUDY N/A 07/24/2012   Procedure: BRAVO [place] STUDY;  Surgeon: Inda Castle, MD;  [facility];  Service: Endoscopy;  Laterality: N/A;  . DILATION AND CURETTAGE OF UTERUS     SAB  . ESOPHAGOGASTRODUODENOSCOPY N/A 07/24/2012   Procedure: ESOPHAGOGASTRODUODENOSCOPY (EGD);  Surgeon: Inda Castle, MD;  Location: [facility];  Service: Endoscopy;  Laterality: N/A;  . OVARIAN CYST REMOVAL  2004   laparotomy -left  . ROBOTIC ASSISTED LAPAROSCOPIC LYSIS OF ADHESION N/A 03/21/2014   Procedure: ROBOTIC ASSISTED LAPAROSCOPIC EXTENSIVE LYSIS OF ADHESION (1 Hour);  Surgeon: Marvene Staff, MD;  Location: [facility];  Service: Gynecology;  Laterality: N/A;  . ROBOTIC ASSISTED SALPINGO OOPHERECTOMY Left 03/21/2014   Procedure:  ROBOTIC ASSISTED LEFT OOPHORECTOMY;  Surgeon: Marvene Staff, MD;  Location: [facility];  Service: Gynecology;  Laterality: Left;  . SHOULDER ARTHROSCOPY Left   . TENNIS ELBOW RELEASE/NIRSCHEL PROCEDURE Right 05/01/2015   Procedure: RIGHT ELBOW  DEBRIDEMENT AND TENDON REPAIR;  Surgeon: [name], MD;  Location: [place];  Service: Orthopedics;  Laterality: Right;  . WISDOM TOOTH EXTRACTION      There were no vitals filed for this visit.                                    PT Long Term Goals - 04/09/16 1232      PT LONG TERM GOAL #1   Title Pt will be able to achieve full R elbow flexion/extension with <2/10 pain to promote return to PLOF   Baseline R elbow AROM WNL but slight pain.   Time 4   Period Weeks   Status Achieved     PT LONG TERM GOAL #2   Title Pt will improve self-perceived QuickDash score to <35% to promote increase in functional mobility so she can perform household tasks.   Baseline 04/08/16: 65.9% (slight improvement)   Time 4   Period Weeks   Status New     PT LONG TERM GOAL #3   Title Pt will increase R shoulder flexion/abduction AROM to >140, >161 respectively so that she can perform overhead lifting tasks    Time 4   Period Weeks   Status Not Met     PT LONG TERM GOAL #4   Title Pt will demonstrate equivalent R UE strength as compared to L UE strength to promote  return to PLOF so she can perform work tasks   Baseline R UE strength grossly 3/5 MMT (ROM limitations), LUE shoulder flexion 4+, abduction 4+, biceps/triceps 5   Time 4   Period Weeks   Status Not Met     PT LONG TERM GOAL #5   Title Pt will report <2/10 resting pain for R shoulder to promote pain free function so she can sleep through the night   Baseline pt with 5/10 resting R shoulder pain; increase R shoulder muscle soreness   Time 4   Period Weeks   Status Not Met             Patient will benefit from skilled therapeutic intervention in order to improve the following deficits and impairments:     Visit Diagnosis: Muscle weakness (generalized)  Stiffness of right shoulder, not elsewhere classified  Chronic right shoulder pain  Joint stiffness of elbow,  right     Problem List Patient Active Problem List   Diagnosis Date Noted  . Neutropenia ([place]) 10/09/2015  . Health care maintenance 11/21/2014  . History of colonic polyps 11/21/2014  . Dysphagia 11/19/2014  . Trochanteric bursitis of left hip 10/08/2014  . URI (upper respiratory infection) 02/10/2014  . Pelvic pain in female 12/25/2013  . Personal history of ovarian cyst 12/25/2013  . Change in bowel movement 11/11/2013  . breast tenderness 09/29/2013  . Dysphagia, unspecified(787.20) 06/12/2012  . Barrett's esophagus 06/12/2012  . Environmental allergies 02/13/2012  . Enlarged thyroid 02/13/2012  . GERD (gastroesophageal reflux disease) 02/08/2012  . Leukopenia 02/08/2012  . Fatigue 02/08/2012  . Hypercholesterolemia 02/08/2012    Pura Spice 06/11/2016, 1:36 PM  [place] [facility] [address] [place], [place], [address] Phone: [phone]   Fax:  [phone]  Name: Kelly Massey MRN: [id number] Date of Birth: 1965-12-23

## 2016-06-17 ENCOUNTER — Ambulatory Visit: Payer: 59 | Admitting: Physical Therapy

## 2016-06-17 DIAGNOSIS — M25511 Pain in right shoulder: Secondary | ICD-10-CM

## 2016-06-17 DIAGNOSIS — G8929 Other chronic pain: Secondary | ICD-10-CM

## 2016-06-17 DIAGNOSIS — M6281 Muscle weakness (generalized): Secondary | ICD-10-CM | POA: Diagnosis not present

## 2016-06-17 DIAGNOSIS — M25611 Stiffness of right shoulder, not elsewhere classified: Secondary | ICD-10-CM

## 2016-06-17 DIAGNOSIS — M25621 Stiffness of right elbow, not elsewhere classified: Secondary | ICD-10-CM

## 2016-06-18 NOTE — Therapy (Signed)
[facility] [address] [place], [place], [address] Phone: [phone]   Fax:  [phone]  Physical Therapy Treatment  Patient Details  Name: Kelly Massey MRN: [id number] Date of Birth: [birth year] Referring Provider: [name] MD  Encounter Date: 06/17/2016    Past Medical History:  Diagnosis Date  . Allergy   . Anemia   . Arthritis    knees - no meds  . Asthma   . Colon polyps   . GERD (gastroesophageal reflux disease) 10/12/10   EGD, positive H. pylori  . HSV infection    History  . Hyperlipidemia    ? no meds - diet controlled  . Post-operative nausea and vomiting   . Seasonal allergies     Past Surgical History:  Procedure Laterality Date  . ABDOMINAL HYSTERECTOMY    . BALLOON DILATION N/A 07/24/2012   Procedure: BALLOON DILATION;  Surgeon: Inda Castle, MD;  Location: [facility];  Service: Endoscopy;  Laterality: N/A;  . BRAVO [place] STUDY N/A 07/24/2012   Procedure: BRAVO [place] STUDY;  Surgeon: Inda Castle, MD;  [facility];  Service: Endoscopy;  Laterality: N/A;  . DILATION AND CURETTAGE OF UTERUS     SAB  . ESOPHAGOGASTRODUODENOSCOPY N/A 07/24/2012   Procedure: ESOPHAGOGASTRODUODENOSCOPY (EGD);  Surgeon: Inda Castle, MD;  Location: [facility];  Service: Endoscopy;  Laterality: N/A;  . OVARIAN CYST REMOVAL  2004   laparotomy -left  . ROBOTIC ASSISTED LAPAROSCOPIC LYSIS OF ADHESION N/A 03/21/2014   Procedure: ROBOTIC ASSISTED LAPAROSCOPIC EXTENSIVE LYSIS OF ADHESION (1 Hour);  Surgeon: Marvene Staff, MD;  Location: [facility];  Service: Gynecology;  Laterality: N/A;  . ROBOTIC ASSISTED SALPINGO OOPHERECTOMY Left 03/21/2014   Procedure:  ROBOTIC ASSISTED LEFT OOPHORECTOMY;  Surgeon: Marvene Staff, MD;  Location: [facility];  Service: Gynecology;  Laterality: Left;  . SHOULDER ARTHROSCOPY Left   . TENNIS ELBOW RELEASE/NIRSCHEL PROCEDURE Right 05/01/2015   Procedure: RIGHT ELBOW  DEBRIDEMENT AND TENDON REPAIR;  Surgeon: [name], MD;  Location: [place];  Service: Orthopedics;  Laterality: Right;  . WISDOM TOOTH EXTRACTION      There were no vitals filed for this visit.                                    PT Long Term Goals - 04/09/16 1232      PT LONG TERM GOAL #1   Title Pt will be able to achieve full R elbow flexion/extension with <2/10 pain to promote return to PLOF   Baseline R elbow AROM WNL but slight pain.   Time 4   Period Weeks   Status Achieved     PT LONG TERM GOAL #2   Title Pt will improve self-perceived QuickDash score to <35% to promote increase in functional mobility so she can perform household tasks.   Baseline 04/08/16: 65.9% (slight improvement)   Time 4   Period Weeks   Status New     PT LONG TERM GOAL #3   Title Pt will increase R shoulder flexion/abduction AROM to >140, >161 respectively so that she can perform overhead lifting tasks    Time 4   Period Weeks   Status Not Met     PT LONG TERM GOAL #4   Title Pt will demonstrate equivalent R UE strength as compared to L UE strength to promote  return to PLOF so she can perform work tasks   Baseline R UE strength grossly 3/5 MMT (ROM limitations), LUE shoulder flexion 4+, abduction 4+, biceps/triceps 5   Time 4   Period Weeks   Status Not Met     PT LONG TERM GOAL #5   Title Pt will report <2/10 resting pain for R shoulder to promote pain free function so she can sleep through the night   Baseline pt with 5/10 resting R shoulder pain; increase R shoulder muscle soreness   Time 4   Period Weeks   Status Not Met             Patient will benefit from skilled therapeutic intervention in order to improve the following deficits and impairments:     Visit Diagnosis: Muscle weakness (generalized)  Stiffness of right shoulder, not elsewhere classified  Chronic right shoulder pain  Joint stiffness of elbow,  right  Muscle right arm weakness     Problem List Patient Active Problem List   Diagnosis Date Noted  . Neutropenia ([place]) 10/09/2015  . Health care maintenance 11/21/2014  . History of colonic polyps 11/21/2014  . Dysphagia 11/19/2014  . Trochanteric bursitis of left hip 10/08/2014  . URI (upper respiratory infection) 02/10/2014  . Pelvic pain in female 12/25/2013  . Personal history of ovarian cyst 12/25/2013  . Change in bowel movement 11/11/2013  . breast tenderness 09/29/2013  . Dysphagia, unspecified(787.20) 06/12/2012  . Barrett's esophagus 06/12/2012  . Environmental allergies 02/13/2012  . Enlarged thyroid 02/13/2012  . GERD (gastroesophageal reflux disease) 02/08/2012  . Leukopenia 02/08/2012  . Fatigue 02/08/2012  . Hypercholesterolemia 02/08/2012    Pura Spice 06/18/2016, 10:48 AM  [place] [facility] [address] [place], [place], [address] Phone: [phone]   Fax:  [phone]  Name: Kelly Massey MRN: [id number] Date of Birth: [birth year]

## 2016-06-23 ENCOUNTER — Ambulatory Visit: Payer: 59 | Admitting: Physical Therapy

## 2016-06-23 DIAGNOSIS — M25611 Stiffness of right shoulder, not elsewhere classified: Secondary | ICD-10-CM

## 2016-06-23 DIAGNOSIS — M6281 Muscle weakness (generalized): Secondary | ICD-10-CM | POA: Diagnosis not present

## 2016-06-23 DIAGNOSIS — M25511 Pain in right shoulder: Secondary | ICD-10-CM

## 2016-06-23 DIAGNOSIS — G8929 Other chronic pain: Secondary | ICD-10-CM

## 2016-06-23 DIAGNOSIS — M25621 Stiffness of right elbow, not elsewhere classified: Secondary | ICD-10-CM

## 2016-06-24 ENCOUNTER — Encounter: Payer: 59 | Admitting: Physical Therapy

## 2016-06-24 NOTE — Therapy (Signed)
[place] [facility] [address] [place], [place], [address] Phone: [phone]   Fax:  [phone]  Physical Therapy Treatment  Patient Details  Name: Kelly Massey MRN: [id number] Date of Birth: 1965/11/29 Referring Provider: [name] MD  Encounter Date: 06/23/2016    Past Medical History:  Diagnosis Date  . Allergy   . Anemia   . Arthritis    knees - no meds  . Asthma   . Colon polyps   . GERD (gastroesophageal reflux disease) 10/12/10   EGD, positive H. pylori  . HSV infection    History  . Hyperlipidemia    ? no meds - diet controlled  . Post-operative nausea and vomiting   . Seasonal allergies     Past Surgical History:  Procedure Laterality Date  . ABDOMINAL HYSTERECTOMY    . BALLOON DILATION N/A 07/24/2012   Procedure: BALLOON DILATION;  Surgeon: Inda Castle, MD;  Location: [facility];  Service: Endoscopy;  Laterality: N/A;  . BRAVO [place] STUDY N/A 07/24/2012   Procedure: BRAVO [place] STUDY;  Surgeon: Inda Castle, MD;  [facility];  Service: Endoscopy;  Laterality: N/A;  . DILATION AND CURETTAGE OF UTERUS     SAB  . ESOPHAGOGASTRODUODENOSCOPY N/A 07/24/2012   Procedure: ESOPHAGOGASTRODUODENOSCOPY (EGD);  Surgeon: Inda Castle, MD;  Location: [facility];  Service: Endoscopy;  Laterality: N/A;  . OVARIAN CYST REMOVAL  2004   laparotomy -left  . ROBOTIC ASSISTED LAPAROSCOPIC LYSIS OF ADHESION N/A 03/21/2014   Procedure: ROBOTIC ASSISTED LAPAROSCOPIC EXTENSIVE LYSIS OF ADHESION (1 Hour);  Surgeon: Marvene Staff, MD;  Location: [facility];  Service: Gynecology;  Laterality: N/A;  . ROBOTIC ASSISTED SALPINGO OOPHERECTOMY Left 03/21/2014   Procedure:  ROBOTIC ASSISTED LEFT OOPHORECTOMY;  Surgeon: Marvene Staff, MD;  Location: [facility];  Service: Gynecology;  Laterality: Left;  . SHOULDER ARTHROSCOPY Left   . TENNIS ELBOW RELEASE/NIRSCHEL PROCEDURE Right 05/01/2015   Procedure: RIGHT ELBOW  DEBRIDEMENT AND TENDON REPAIR;  Surgeon: [name], MD;  Location: [place];  Service: Orthopedics;  Laterality: Right;  . WISDOM TOOTH EXTRACTION      There were no vitals filed for this visit.                                    PT Long Term Goals - 04/09/16 1232      PT LONG TERM GOAL #1   Title Pt will be able to achieve full R elbow flexion/extension with <2/10 pain to promote return to PLOF   Baseline R elbow AROM WNL but slight pain.   Time 4   Period Weeks   Status Achieved     PT LONG TERM GOAL #2   Title Pt will improve self-perceived QuickDash score to <35% to promote increase in functional mobility so she can perform household tasks.   Baseline 04/08/16: 65.9% (slight improvement)   Time 4   Period Weeks   Status New     PT LONG TERM GOAL #3   Title Pt will increase R shoulder flexion/abduction AROM to >140, >161 respectively so that she can perform overhead lifting tasks    Time 4   Period Weeks   Status Not Met     PT LONG TERM GOAL #4   Title Pt will demonstrate equivalent R UE strength as compared to L UE strength to promote  return to PLOF so she can perform work tasks   Baseline R UE strength grossly 3/5 MMT (ROM limitations), LUE shoulder flexion 4+, abduction 4+, biceps/triceps 5   Time 4   Period Weeks   Status Not Met     PT LONG TERM GOAL #5   Title Pt will report <2/10 resting pain for R shoulder to promote pain free function so she can sleep through the night   Baseline pt with 5/10 resting R shoulder pain; increase R shoulder muscle soreness   Time 4   Period Weeks   Status Not Met             Patient will benefit from skilled therapeutic intervention in order to improve the following deficits and impairments:     Visit Diagnosis: Muscle weakness (generalized)  Stiffness of right shoulder, not elsewhere classified  Chronic right shoulder pain  Joint stiffness of elbow,  right     Problem List Patient Active Problem List   Diagnosis Date Noted  . Neutropenia ([place]) 10/09/2015  . Health care maintenance 11/21/2014  . History of colonic polyps 11/21/2014  . Dysphagia 11/19/2014  . Trochanteric bursitis of left hip 10/08/2014  . URI (upper respiratory infection) 02/10/2014  . Pelvic pain in female 12/25/2013  . Personal history of ovarian cyst 12/25/2013  . Change in bowel movement 11/11/2013  . breast tenderness 09/29/2013  . Dysphagia, unspecified(787.20) 06/12/2012  . Barrett's esophagus 06/12/2012  . Environmental allergies 02/13/2012  . Enlarged thyroid 02/13/2012  . GERD (gastroesophageal reflux disease) 02/08/2012  . Leukopenia 02/08/2012  . Fatigue 02/08/2012  . Hypercholesterolemia 02/08/2012    Pura Spice 06/24/2016, 4:53 PM  [facility] [address] [place], [place], [address] Phone: [phone]   Fax:  [phone]  Name: Kelly Massey MRN: [id number] Date of Birth: 15-Mar-1966

## 2016-07-01 ENCOUNTER — Ambulatory Visit: Payer: 59 | Admitting: Physical Therapy

## 2016-07-01 DIAGNOSIS — M25621 Stiffness of right elbow, not elsewhere classified: Secondary | ICD-10-CM

## 2016-07-01 DIAGNOSIS — M6281 Muscle weakness (generalized): Secondary | ICD-10-CM | POA: Diagnosis not present

## 2016-07-01 DIAGNOSIS — M25511 Pain in right shoulder: Secondary | ICD-10-CM

## 2016-07-01 DIAGNOSIS — G8929 Other chronic pain: Secondary | ICD-10-CM

## 2016-07-01 DIAGNOSIS — M25611 Stiffness of right shoulder, not elsewhere classified: Secondary | ICD-10-CM

## 2016-07-02 NOTE — Therapy (Signed)
[facility] [address] [place], [place], [address] Phone: [phone]   Fax:  [phone]  Physical Therapy Treatment  Patient Details  Name: Kelly Massey MRN: [id number] Date of Birth: 1966/02/21 Referring Provider: [name] MD  Encounter Date: 07/01/2016    Past Medical History:  Diagnosis Date  . Allergy   . Anemia   . Arthritis    knees - no meds  . Asthma   . Colon polyps   . GERD (gastroesophageal reflux disease) 10/12/10   EGD, positive H. pylori  . HSV infection    History  . Hyperlipidemia    ? no meds - diet controlled  . Post-operative nausea and vomiting   . Seasonal allergies     Past Surgical History:  Procedure Laterality Date  . ABDOMINAL HYSTERECTOMY    . BALLOON DILATION N/A 07/24/2012   Procedure: BALLOON DILATION;  Surgeon: Inda Castle, MD;  Location: [facility];  Service: Endoscopy;  Laterality: N/A;  . BRAVO [place] STUDY N/A 07/24/2012   Procedure: BRAVO [place] STUDY;  Surgeon: Inda Castle, MD;  Location: [facility];  Service: Endoscopy;  Laterality: N/A;  . DILATION AND CURETTAGE OF UTERUS     SAB  . ESOPHAGOGASTRODUODENOSCOPY N/A 07/24/2012   Procedure: ESOPHAGOGASTRODUODENOSCOPY (EGD);  Surgeon: Inda Castle, MD;  Location: [facility];  Service: Endoscopy;  Laterality: N/A;  . OVARIAN CYST REMOVAL  2004   laparotomy -left  . ROBOTIC ASSISTED LAPAROSCOPIC LYSIS OF ADHESION N/A 03/21/2014   Procedure: ROBOTIC ASSISTED LAPAROSCOPIC EXTENSIVE LYSIS OF ADHESION (1 Hour);  Surgeon: Marvene Staff, MD;  Location: [facility];  Service: Gynecology;  Laterality: N/A;  . ROBOTIC ASSISTED SALPINGO OOPHERECTOMY Left 03/21/2014   Procedure:  ROBOTIC ASSISTED LEFT OOPHORECTOMY;  Surgeon: Marvene Staff, MD;  Location: [facility];  Service: Gynecology;  Laterality: Left;  . SHOULDER ARTHROSCOPY Left   . TENNIS ELBOW RELEASE/NIRSCHEL PROCEDURE Right 05/01/2015   Procedure: RIGHT ELBOW  DEBRIDEMENT AND TENDON REPAIR;  Surgeon: [name], MD;  Location: [facility];  Service: Orthopedics;  Laterality: Right;  . WISDOM TOOTH EXTRACTION      There were no vitals filed for this visit.                                    PT Long Term Goals - 04/09/16 1232      PT LONG TERM GOAL #1   Title Pt will be able to achieve full R elbow flexion/extension with <2/10 pain to promote return to PLOF   Baseline R elbow AROM WNL but slight pain.   Time 4   Period Weeks   Status Achieved     PT LONG TERM GOAL #2   Title Pt will improve self-perceived QuickDash score to <35% to promote increase in functional mobility so she can perform household tasks.   Baseline 04/08/16: 65.9% (slight improvement)   Time 4   Period Weeks   Status New     PT LONG TERM GOAL #3   Title Pt will increase R shoulder flexion/abduction AROM to >140, >161 respectively so that she can perform overhead lifting tasks    Time 4   Period Weeks   Status Not Met     PT LONG TERM GOAL #4   Title Pt will demonstrate equivalent R UE strength as compared to L UE strength to promote  return to PLOF so she can perform work tasks   Baseline R UE strength grossly 3/5 MMT (ROM limitations), LUE shoulder flexion 4+, abduction 4+, biceps/triceps 5   Time 4   Period Weeks   Status Not Met     PT LONG TERM GOAL #5   Title Pt will report <2/10 resting pain for R shoulder to promote pain free function so she can sleep through the night   Baseline pt with 5/10 resting R shoulder pain; increase R shoulder muscle soreness   Time 4   Period Weeks   Status Not Met             Patient will benefit from skilled therapeutic intervention in order to improve the following deficits and impairments:     Visit Diagnosis: Muscle weakness (generalized)  Stiffness of right shoulder, not elsewhere classified  Chronic right shoulder pain  Joint stiffness of elbow,  right     Problem List Patient Active Problem List   Diagnosis Date Noted  . Neutropenia ([place]) 10/09/2015  . Health care maintenance 11/21/2014  . History of colonic polyps 11/21/2014  . Dysphagia 11/19/2014  . Trochanteric bursitis of left hip 10/08/2014  . URI (upper respiratory infection) 02/10/2014  . Pelvic pain in female 12/25/2013  . Personal history of ovarian cyst 12/25/2013  . Change in bowel movement 11/11/2013  . breast tenderness 09/29/2013  . Dysphagia, unspecified(787.20) 06/12/2012  . Barrett's esophagus 06/12/2012  . Environmental allergies 02/13/2012  . Enlarged thyroid 02/13/2012  . GERD (gastroesophageal reflux disease) 02/08/2012  . Leukopenia 02/08/2012  . Fatigue 02/08/2012  . Hypercholesterolemia 02/08/2012    Pura Spice 07/02/2016, 11:06 AM  [facility] [address] [place], [place], [address] Phone: [phone]   Fax:  [phone]  Name: KHALILA BUECHNER MRN: [id number] Date of Birth: 10/16/1965

## 2016-07-15 ENCOUNTER — Ambulatory Visit: Payer: 59 | Attending: Orthopedic Surgery | Admitting: Physical Therapy

## 2016-07-15 DIAGNOSIS — M25611 Stiffness of right shoulder, not elsewhere classified: Secondary | ICD-10-CM | POA: Diagnosis present

## 2016-07-15 DIAGNOSIS — G8929 Other chronic pain: Secondary | ICD-10-CM | POA: Insufficient documentation

## 2016-07-15 DIAGNOSIS — M25621 Stiffness of right elbow, not elsewhere classified: Secondary | ICD-10-CM | POA: Insufficient documentation

## 2016-07-15 DIAGNOSIS — M25511 Pain in right shoulder: Secondary | ICD-10-CM | POA: Insufficient documentation

## 2016-07-15 DIAGNOSIS — M6281 Muscle weakness (generalized): Secondary | ICD-10-CM | POA: Diagnosis not present

## 2016-07-16 NOTE — Therapy (Signed)
[facility] [address] [place], [place], [address] Phone: [phone]   Fax:  [phone]  Physical Therapy Treatment  Patient Details  Name: Kelly Massey MRN: [id number] Date of Birth: 1965/09/14 Referring Provider: [name] MD  Encounter Date: 07/15/2016    Past Medical History:  Diagnosis Date  . Allergy   . Anemia   . Arthritis    knees - no meds  . Asthma   . Colon polyps   . GERD (gastroesophageal reflux disease) 10/12/10   EGD, positive H. pylori  . HSV infection    History  . Hyperlipidemia    ? no meds - diet controlled  . Post-operative nausea and vomiting   . Seasonal allergies     Past Surgical History:  Procedure Laterality Date  . ABDOMINAL HYSTERECTOMY    . BALLOON DILATION N/A 07/24/2012   Procedure: BALLOON DILATION;  Surgeon: Inda Castle, MD;  Location: [facility];  Service: Endoscopy;  Laterality: N/A;  . BRAVO [place] STUDY N/A 07/24/2012   Procedure: BRAVO [place] STUDY;  Surgeon: Inda Castle, MD;  Location: [facility];  Service: Endoscopy;  Laterality: N/A;  . DILATION AND CURETTAGE OF UTERUS     SAB  . ESOPHAGOGASTRODUODENOSCOPY N/A 07/24/2012   Procedure: ESOPHAGOGASTRODUODENOSCOPY (EGD);  Surgeon: Inda Castle, MD;  Location: [facility];  Service: Endoscopy;  Laterality: N/A;  . OVARIAN CYST REMOVAL  2004   laparotomy -left  . ROBOTIC ASSISTED LAPAROSCOPIC LYSIS OF ADHESION N/A 03/21/2014   Procedure: ROBOTIC ASSISTED LAPAROSCOPIC EXTENSIVE LYSIS OF ADHESION (1 Hour);  Surgeon: Marvene Staff, MD;  Location: [facility];  Service: Gynecology;  Laterality: N/A;  . ROBOTIC ASSISTED SALPINGO OOPHERECTOMY Left 03/21/2014   Procedure:  ROBOTIC ASSISTED LEFT OOPHORECTOMY;  Surgeon: Marvene Staff, MD;  Location: [facility];  Service: Gynecology;  Laterality: Left;  . SHOULDER ARTHROSCOPY Left   . TENNIS ELBOW RELEASE/NIRSCHEL PROCEDURE Right 05/01/2015   Procedure: RIGHT ELBOW  DEBRIDEMENT AND TENDON REPAIR;  Surgeon: [name], MD;  Location: [facility];  Service: Orthopedics;  Laterality: Right;  . WISDOM TOOTH EXTRACTION      There were no vitals filed for this visit.                                    PT Long Term Goals - 04/09/16 1232      PT LONG TERM GOAL #1   Title Pt will be able to achieve full R elbow flexion/extension with <2/10 pain to promote return to PLOF   Baseline R elbow AROM WNL but slight pain.   Time 4   Period Weeks   Status Achieved     PT LONG TERM GOAL #2   Title Pt will improve self-perceived QuickDash score to <35% to promote increase in functional mobility so she can perform household tasks.   Baseline 04/08/16: 65.9% (slight improvement)   Time 4   Period Weeks   Status New     PT LONG TERM GOAL #3   Title Pt will increase R shoulder flexion/abduction AROM to >140, >161 respectively so that she can perform overhead lifting tasks    Time 4   Period Weeks   Status Not Met     PT LONG TERM GOAL #4   Title Pt will demonstrate equivalent R UE strength as compared to L UE strength to promote  return to PLOF so she can perform work tasks   Baseline R UE strength grossly 3/5 MMT (ROM limitations), LUE shoulder flexion 4+, abduction 4+, biceps/triceps 5   Time 4   Period Weeks   Status Not Met     PT LONG TERM GOAL #5   Title Pt will report <2/10 resting pain for R shoulder to promote pain free function so she can sleep through the night   Baseline pt with 5/10 resting R shoulder pain; increase R shoulder muscle soreness   Time 4   Period Weeks   Status Not Met             Patient will benefit from skilled therapeutic intervention in order to improve the following deficits and impairments:     Visit Diagnosis: Muscle weakness (generalized)  Stiffness of right shoulder, not elsewhere classified  Joint stiffness of elbow, right  Chronic right shoulder  pain     Problem List Patient Active Problem List   Diagnosis Date Noted  . Neutropenia ([place]) 10/09/2015  . Health care maintenance 11/21/2014  . History of colonic polyps 11/21/2014  . Dysphagia 11/19/2014  . Trochanteric bursitis of left hip 10/08/2014  . URI (upper respiratory infection) 02/10/2014  . Pelvic pain in female 12/25/2013  . Personal history of ovarian cyst 12/25/2013  . Change in bowel movement 11/11/2013  . breast tenderness 09/29/2013  . Dysphagia, unspecified(787.20) 06/12/2012  . Barrett's esophagus 06/12/2012  . Environmental allergies 02/13/2012  . Enlarged thyroid 02/13/2012  . GERD (gastroesophageal reflux disease) 02/08/2012  . Leukopenia 02/08/2012  . Fatigue 02/08/2012  . Hypercholesterolemia 02/08/2012    Pura Spice 07/16/2016, 3:49 PM  [facility] [address] [place], [place], [address] Phone: [phone]   Fax:  [phone]  Name: Kelly Massey MRN: [id number] Date of Birth: [birth year]

## 2016-07-20 ENCOUNTER — Other Ambulatory Visit: Payer: Self-pay | Admitting: Internal Medicine

## 2016-07-20 DIAGNOSIS — M19011 Primary osteoarthritis, right shoulder: Secondary | ICD-10-CM | POA: Diagnosis not present

## 2016-07-20 DIAGNOSIS — Z1231 Encounter for screening mammogram for malignant neoplasm of breast: Secondary | ICD-10-CM

## 2016-07-27 DIAGNOSIS — M19011 Primary osteoarthritis, right shoulder: Secondary | ICD-10-CM | POA: Diagnosis not present

## 2016-07-28 ENCOUNTER — Encounter: Payer: Self-pay | Admitting: Family

## 2016-07-28 ENCOUNTER — Ambulatory Visit (INDEPENDENT_AMBULATORY_CARE_PROVIDER_SITE_OTHER): Payer: 59 | Admitting: Family

## 2016-07-28 VITALS — BP 122/78 | HR 89 | Temp 98.2°F | Ht 66.0 in | Wt 198.0 lb

## 2016-07-28 DIAGNOSIS — R42 Dizziness and giddiness: Secondary | ICD-10-CM

## 2016-07-28 LAB — CBC WITH DIFFERENTIAL/PLATELET
Basophils Absolute: 0 10*3/uL (ref 0.0–0.1)
Basophils Relative: 1 % (ref 0.0–3.0)
Eosinophils Absolute: 0 10*3/uL (ref 0.0–0.7)
Eosinophils Relative: 0.9 % (ref 0.0–5.0)
HCT: 41.7 % (ref 36.0–46.0)
Hemoglobin: 13.8 g/dL (ref 12.0–15.0)
Lymphocytes Relative: 49.4 % — ABNORMAL HIGH (ref 12.0–46.0)
Lymphs Abs: 1.3 10*3/uL (ref 0.7–4.0)
MCHC: 33 g/dL (ref 30.0–36.0)
MCV: 92.2 fl (ref 78.0–100.0)
Monocytes Absolute: 0.4 10*3/uL (ref 0.1–1.0)
Monocytes Relative: 13.4 % — ABNORMAL HIGH (ref 3.0–12.0)
Neutro Abs: 1 10*3/uL — ABNORMAL LOW (ref 1.4–7.7)
Neutrophils Relative %: 35.3 % — ABNORMAL LOW (ref 43.0–77.0)
Platelets: 241 10*3/uL (ref 150.0–400.0)
RBC: 4.53 Mil/uL (ref 3.87–5.11)
RDW: 14.4 % (ref 11.5–15.5)
WBC: 2.7 10*3/uL — ABNORMAL LOW (ref 4.0–10.5)

## 2016-07-28 LAB — MAGNESIUM: Magnesium: 2.1 mg/dL (ref 1.5–2.5)

## 2016-07-28 LAB — COMPREHENSIVE METABOLIC PANEL
ALT: 10 U/L (ref 0–35)
AST: 14 U/L (ref 0–37)
Albumin: 4.3 g/dL (ref 3.5–5.2)
Alkaline Phosphatase: 44 U/L (ref 39–117)
BUN: 7 mg/dL (ref 6–23)
CO2: 29 mEq/L (ref 19–32)
Calcium: 9.5 mg/dL (ref 8.4–10.5)
Chloride: 102 mEq/L (ref 96–112)
Creatinine, Ser: 0.72 mg/dL (ref 0.40–1.20)
GFR: 109.78 mL/min (ref >60.00)
Glucose, Bld: 90 mg/dL (ref 70–99)
Potassium: 4.6 mEq/L (ref 3.5–5.1)
Sodium: 136 mEq/L (ref 135–145)
Total Bilirubin: 0.4 mg/dL (ref 0.2–1.2)
Total Protein: 7 g/dL (ref 6.0–8.3)

## 2016-07-28 LAB — TSH: TSH: 0.76 u[IU]/mL (ref 0.35–4.50)

## 2016-07-28 NOTE — Assessment & Plan Note (Addendum)
She is not orthostatic ( see flow sheet). Discussed at length with patient the causes of dizziness as well as my working diagnosis of BPPV based on onset correlating with positional changes. Unable to elicit with dix hall pike however reassured by normal neurologic exam.  However due to nonspecific symptom of head pressure and more frequent nature of dizziness, we jointly agreed MRI brain and labs were appropriate next step.

## 2016-07-28 NOTE — Progress Notes (Signed)
Subjective:    Patient ID: Kelly Massey, female    DOB: 13-Sep-1965, 51 y.o.   MRN: [id number]  CC: Kelly Massey is a 51 y.o. female who presents today for an acute visit.    HPI: CC: vertigo x 2 weeks, waxing and waning; occurs when laying back on pillow, mainly happens at night. 'feels like she was rocking.' Describes a pressure in head at this time, not a ' pain' but feels like someone is grabbing her head. Resolves after a few minutes after sitting.  Episode of lightheadedness when standing and ironing.  No syncope.  Hasn't tried any medications. Denies exertional chest pain or pressure, numbness or tingling radiating to left arm or jaw, palpitations, severe headaches, changes in vision, or shortness of breath during episodes.   Does note seasonal allergies and loosing voice, coughing, sneezing. Some nasal congestion. No ear pain, trouble hearing. Off and on tinnitus. Ears tested at work and states 'improving'. Never been told trouble hearing.    h/o dizziness in the past ; 'seems to be hanging around this time.'  No cardiac history.      h/o leukopenia; follows with hematology Last saw brahmanday 10/2015. [facility] 800 ANC. Following trend of anc and consider bone marrow biopsy if continued.  HISTORY:  Past Medical History:  Diagnosis Date  . Allergy   . Anemia   . Arthritis    knees - no meds  . Asthma   . Colon polyps   . GERD (gastroesophageal reflux disease) 10/12/10   EGD, positive H. pylori  . HSV infection    History  . Hyperlipidemia    ? no meds - diet controlled  . Post-operative nausea and vomiting   . Seasonal allergies    Past Surgical History:  Procedure Laterality Date  . ABDOMINAL HYSTERECTOMY    . BALLOON DILATION N/A 07/24/2012   Procedure: BALLOON DILATION;  Surgeon: Inda Castle, MD;  Location: [facility];  Service: Endoscopy;  Laterality: N/A;  . BRAVO [place] STUDY N/A 07/24/2012   Procedure: BRAVO [place] STUDY;  Surgeon: Inda Castle, MD;   Location: [facility];  Service: Endoscopy;  Laterality: N/A;  . DILATION AND CURETTAGE OF UTERUS     SAB  . ESOPHAGOGASTRODUODENOSCOPY N/A 07/24/2012   Procedure: ESOPHAGOGASTRODUODENOSCOPY (EGD);  Surgeon: Inda Castle, MD;  Location: [facility];  Service: Endoscopy;  Laterality: N/A;  . OVARIAN CYST REMOVAL  2004   laparotomy -left  . ROBOTIC ASSISTED LAPAROSCOPIC LYSIS OF ADHESION N/A 03/21/2014   Procedure: ROBOTIC ASSISTED LAPAROSCOPIC EXTENSIVE LYSIS OF ADHESION (1 Hour);  Surgeon: Marvene Staff, MD;  Location: [facility];  Service: Gynecology;  Laterality: N/A;  . ROBOTIC ASSISTED SALPINGO OOPHERECTOMY Left 03/21/2014   Procedure:  ROBOTIC ASSISTED LEFT OOPHORECTOMY;  Surgeon: Marvene Staff, MD;  Location: [facility];  Service: Gynecology;  Laterality: Left;  . SHOULDER ARTHROSCOPY Left   . TENNIS ELBOW RELEASE/NIRSCHEL PROCEDURE Right 05/01/2015   Procedure: RIGHT ELBOW DEBRIDEMENT AND TENDON REPAIR;  Surgeon: [name], MD;  Location: [facility];  Service: Orthopedics;  Laterality: Right;  . WISDOM TOOTH EXTRACTION     Family History  Problem Relation Age of Onset  . Arthritis Mother   . Stroke Mother   . Hypertension Mother   . Heart failure Mother   . Dementia Mother   . Breast cancer Maternal Aunt   . Prostate cancer Maternal Uncle     x 2  . Diabetes Sister   .  Diabetes Brother     x 2  . Diabetes Paternal Grandmother   . Hypertension Other   . Hyperlipidemia Other   . Diabetes Paternal Grandfather   . Lung cancer Father   . Diabetes Maternal Uncle   . Throat cancer Maternal Aunt     Smoker  . Colon cancer Neg Hx     Allergies: Adhesive [tape]; Aspirin; Ibuprofen; Penicillins; Nsaids; Oxycodone; and Shrimp [shellfish allergy] Current Outpatient Prescriptions on File Prior to Visit  Medication Sig Dispense Refill  . Calcium Carbonate-Vitamin D (CALCIUM 600+D3) 600-400 MG-UNIT per tablet Take 1 tablet by mouth daily.    Marland Kitchen DEXILANT  60 MG capsule TAKE 1 CAPSULE (60 MG TOTAL) BY MOUTH DAILY. 30 capsule 8  . ferrous sulfate 325 (65 FE) MG tablet Take 325 mg by mouth daily.     . mometasone (NASONEX) 50 MCG/ACT nasal spray PLACE 2 SPRAYS INTO THE NOSE DAILY. 51 g 3  . Multiple Vitamins-Minerals (MULTIVITAMIN WITH MINERALS) tablet Take 1 tablet by mouth daily.    . ranitidine (ZANTAC) 150 MG tablet Take 300 mg by mouth daily as needed for heartburn.     . vitamin E 400 UNIT capsule Take 400 Units by mouth daily. Reported on 10/09/2015     No current facility-administered medications on file prior to visit.     Social History  Substance Use Topics  . Smoking status: Never Smoker  . Smokeless tobacco: Never Used  . Alcohol use No    Review of Systems  Constitutional: Negative for chills and fever.  HENT: Positive for congestion, rhinorrhea and sneezing. Negative for ear discharge, ear pain and hearing loss.   Respiratory: Positive for cough.   Cardiovascular: Negative for chest pain and palpitations.  Gastrointestinal: Negative for nausea and vomiting.  Neurological: Positive for dizziness. Negative for syncope, numbness and headaches.  Psychiatric/Behavioral: Negative for confusion.      Objective:    BP 122/78   Pulse 89   Temp 98.2 F (36.8 C) (Oral)   Ht 5' 6"  (1.676 m)   Wt 198 lb (89.8 kg)   LMP 10/06/2011   SpO2 96%   BMI 31.96 kg/m    Physical Exam  Constitutional: Vital signs are normal. She appears well-developed and well-nourished.  HENT:  Head: Normocephalic and atraumatic.  Right Ear: Hearing, tympanic membrane, external ear and ear canal normal. No swelling or tenderness. Tympanic membrane is not erythematous and not bulging. No middle ear effusion.  Left Ear: Hearing, tympanic membrane, external ear and ear canal normal. No swelling or tenderness. Tympanic membrane is not erythematous and not bulging.  No middle ear effusion.  Nose: No rhinorrhea. Right sinus exhibits no maxillary sinus  tenderness and no frontal sinus tenderness. Left sinus exhibits no maxillary sinus tenderness and no frontal sinus tenderness.  Mouth/Throat: Uvula is midline, oropharynx is clear and moist and mucous membranes are normal. No uvula swelling. No posterior oropharyngeal edema or posterior oropharyngeal erythema.  Eyes: Conjunctivae, EOM and lids are normal. Pupils are equal, round, and reactive to light. Lids are everted and swept, no foreign bodies found.  Cardiovascular: Normal rate, regular rhythm, normal heart sounds and normal pulses.   Pulmonary/Chest: Effort normal and breath sounds normal. She has no wheezes. She has no rhonchi. She has no rales.  Lymphadenopathy:       Head (right side): No submental, no submandibular, no tonsillar, no preauricular, no posterior auricular and no occipital adenopathy present.       Head (left  side): No submental, no submandibular, no tonsillar, no preauricular, no posterior auricular and no occipital adenopathy present.    She has no cervical adenopathy.       Right cervical: No superficial cervical, no deep cervical and no posterior cervical adenopathy present.      Left cervical: No superficial cervical, no deep cervical and no posterior cervical adenopathy present.  Neurological: She is alert. She has normal strength. No cranial nerve deficit or sensory deficit. She displays a negative Romberg sign.  Reflex Scores:      Bicep reflexes are 2+ on the right side and 2+ on the left side.      Patellar reflexes are 2+ on the right side and 2+ on the left side. Grip equal and strong bilateral upper extremities. Gait strong and steady. Able to perform finger-to-nose without difficulty.  Dix hall pike maneuver did not elicit dizziness. No nystagmus noted. Patient denied nausea or vertigo during maneuver.     Skin: Skin is warm and dry.  Psychiatric: She has a normal mood and affect. Her speech is normal and behavior is normal. Thought content normal.  Vitals  reviewed.      Assessment & Plan:   Problem List Items Addressed This Visit      Other   Vertigo - Primary    She is not orthostatic ( see flow sheet). Discussed at length with patient the causes of dizziness as well as my working diagnosis of BPPV based on onset correlating with positional changes. Unable to elicit with dix hall pike however reassured by normal neurologic exam.  However due to nonspecific symptom of head pressure and more frequent nature of dizziness, we jointly agreed MRI brain and labs were appropriate next step.           Relevant Orders   CBC with Differential/Platelet   Comprehensive metabolic panel   TSH   Magnesium   MR BRAIN W WO CONTRAST        I am having Ms. Malanga maintain her ranitidine, ferrous sulfate, Calcium Carbonate-Vitamin D, vitamin E, multivitamin with minerals, DEXILANT, and mometasone.   No orders of the defined types were placed in this encounter.   Return precautions given.   Risks, benefits, and alternatives of the medications and treatment plan prescribed today were discussed, and patient expressed understanding.   Education regarding symptom management and diagnosis given to patient on AVS.  Continue to follow with Einar Pheasant, MD for routine health maintenance.   [place] and I agreed with plan.   Mable Paris, FNP

## 2016-07-28 NOTE — Progress Notes (Signed)
Pre visit review using our clinic review tool, if applicable. No additional management support is needed unless otherwise documented below in the visit note. 

## 2016-07-28 NOTE — Patient Instructions (Addendum)
Labs today  At this point, I suspect Benign paroxysmal positional vertigo (BPPV) based on laying back causing symptom and have included information from [facility] below.   You may look videos for Epley's maneuvers as we discussed online.   If dizziness/vertigo persists, a referral to ENT for further evaluation and medication may be appropriate.   If there is no improvement in your symptoms, or if there is any worsening of symptoms, or if you have any additional concerns, please return to this clinic for re-evaluation; or, if we are closed, consider going to the Emergency Room for evaluation.   What is BPPV?  BPPV  is one of the most common causes of vertigo - the sudden sensation that you're spinning or that the inside of your head is spinning. Benign paroxysmal positional vertigo causes brief episodes of mild to intense dizziness. Benign paroxysmal positional vertigo is usually triggered by specific changes in the position of your head. This might occur when you tip your head up or down, when you lie down, or when you turn over or sit up in bed. Although benign paroxysmal positional vertigo can be a bothersome problem, it's rarely serious except when it increases the chance of falls.  If you experience dizziness associated with benign paroxysmal positional vertigo (BPPV), consider these tips: Be aware of the possibility of losing your balance, which can lead to falling and serious injury.  Sit down immediately when you feel dizzy.  Use good lighting if you get up at night.  Walk with a cane for stability if you're at risk of falling.  Work closely with your doctor to manage your symptoms effectively. BPPV may recur even after successful therapy. Fortunately, although there's no cure, the condition can be managed with physical therapy and home treatments.   Dizziness [Uncertain Cause] Dizziness is a common symptom sometimes described as "lightheadedness" or feeling like you are going to  faint. If it lasts for only a few seconds and is related to changes in position (such as getting up after lying or sitting for a long time), it is usually not a sign of anything serious. Dizziness that lasts for minutes to hours, or comes on for no apparent reason, may be a sign of a more serious problem (such as dehydration, a medicine reaction, disease of the heart or brain). Today's exam did not show an exact cause for your dizzy spell . Sometimes additional tests are required before a cause can be found. Therefore, it is important to follow up with your doctor if your symptoms continue. Home Care: 1) If a dizzy spell occurs and lasts more than a few seconds, lie down until it passes. If you are lying down, then you cannot hurt yourself by falling if you do faint. 2) Do not drive or operate dangerous equipment until the dizzy spells have stopped for at least 48 hours. 3) If dizzy spells occur with sudden standing, this may be a sign of mild dehydration. Drink extra fluids over the next few days. 4) If you recently started a new medicine or if you had the dose of a current medicine increased (especially blood pressure medicine), talk with the prescribing doctor about your symptoms. Dose adjustments may be needed. Follow Up with your doctor for further evaluation within the next seven days, if your symptoms continue. Get Prompt Medical Attention if any of the following occur: -- Worsening of your symptoms -- Fainting, headache or seizure -- Repeated vomiting -- Feeling like you or the room  is spinning -- Chest, arm, neck, back or jaw pain -- Palpitations (the sense that your heart is fluttering or beating fast or hard) -- Shortness of breath -- Blood in vomit or stool (black or red color) -- Weakness of an arm or leg or one side of the face -- Difficulty with speech or vision  2000-2015 [employer] [facility] [employer] [address], [place] [address]. All rights reserved. This information  is not intended as a substitute for professional medical care. Always follow your healthcare professional's instructions.

## 2016-08-04 ENCOUNTER — Ambulatory Visit: Payer: 59 | Attending: Orthopedic Surgery | Admitting: Physical Therapy

## 2016-08-04 DIAGNOSIS — M25621 Stiffness of right elbow, not elsewhere classified: Secondary | ICD-10-CM | POA: Diagnosis not present

## 2016-08-04 DIAGNOSIS — M25511 Pain in right shoulder: Secondary | ICD-10-CM | POA: Insufficient documentation

## 2016-08-04 DIAGNOSIS — M25611 Stiffness of right shoulder, not elsewhere classified: Secondary | ICD-10-CM | POA: Diagnosis not present

## 2016-08-04 DIAGNOSIS — M6281 Muscle weakness (generalized): Secondary | ICD-10-CM

## 2016-08-04 DIAGNOSIS — G8929 Other chronic pain: Secondary | ICD-10-CM | POA: Insufficient documentation

## 2016-08-05 NOTE — Therapy (Signed)
[facility] [address] [place], [place], [address] Phone: [phone]   Fax:  [phone]  Physical Therapy Treatment  Patient Details  Name: Kelly Massey MRN: [id number] Date of Birth: [birth year] Referring Provider: [name] MD  Encounter Date: 08/04/2016    Past Medical History:  Diagnosis Date  . Allergy   . Anemia   . Arthritis    knees - no meds  . Asthma   . Colon polyps   . GERD (gastroesophageal reflux disease) 10/12/10   EGD, positive H. pylori  . HSV infection    History  . Hyperlipidemia    ? no meds - diet controlled  . Post-operative nausea and vomiting   . Seasonal allergies     Past Surgical History:  Procedure Laterality Date  . ABDOMINAL HYSTERECTOMY    . BALLOON DILATION N/A 07/24/2012   Procedure: BALLOON DILATION;  Surgeon: Inda Castle, MD;  Location: [facility];  Service: Endoscopy;  Laterality: N/A;  . BRAVO [place] STUDY N/A 07/24/2012   Procedure: BRAVO [place] STUDY;  Surgeon: Inda Castle, MD;  Location: [facility];  Service: Endoscopy;  Laterality: N/A;  . DILATION AND CURETTAGE OF UTERUS     SAB  . ESOPHAGOGASTRODUODENOSCOPY N/A 07/24/2012   Procedure: ESOPHAGOGASTRODUODENOSCOPY (EGD);  Surgeon: Inda Castle, MD;  Location: [facility];  Service: Endoscopy;  Laterality: N/A;  . OVARIAN CYST REMOVAL  2004   laparotomy -left  . ROBOTIC ASSISTED LAPAROSCOPIC LYSIS OF ADHESION N/A 03/21/2014   Procedure: ROBOTIC ASSISTED LAPAROSCOPIC EXTENSIVE LYSIS OF ADHESION (1 Hour);  Surgeon: Marvene Staff, MD;  Location: [facility];  Service: Gynecology;  Laterality: N/A;  . ROBOTIC ASSISTED SALPINGO OOPHERECTOMY Left 03/21/2014   Procedure:  ROBOTIC ASSISTED LEFT OOPHORECTOMY;  Surgeon: Marvene Staff, MD;  Location: [facility];  Service: Gynecology;  Laterality: Left;  . SHOULDER ARTHROSCOPY Left   . TENNIS ELBOW RELEASE/NIRSCHEL PROCEDURE Right 05/01/2015   Procedure: RIGHT ELBOW  DEBRIDEMENT AND TENDON REPAIR;  Surgeon: [name], MD;  Location: [facility];  Service: Orthopedics;  Laterality: Right;  . WISDOM TOOTH EXTRACTION      There were no vitals filed for this visit.                                    PT Long Term Goals - 04/09/16 1232      PT LONG TERM GOAL #1   Title Pt will be able to achieve full R elbow flexion/extension with <2/10 pain to promote return to PLOF   Baseline R elbow AROM WNL but slight pain.   Time 4   Period Weeks   Status Achieved     PT LONG TERM GOAL #2   Title Pt will improve self-perceived QuickDash score to <35% to promote increase in functional mobility so she can perform household tasks.   Baseline 04/08/16: 65.9% (slight improvement)   Time 4   Period Weeks   Status New     PT LONG TERM GOAL #3   Title Pt will increase R shoulder flexion/abduction AROM to >140, >161 respectively so that she can perform overhead lifting tasks    Time 4   Period Weeks   Status Not Met     PT LONG TERM GOAL #4   Title Pt will demonstrate equivalent R UE strength as compared to L UE strength to promote  return to PLOF so she can perform work tasks   Baseline R UE strength grossly 3/5 MMT (ROM limitations), LUE shoulder flexion 4+, abduction 4+, biceps/triceps 5   Time 4   Period Weeks   Status Not Met     PT LONG TERM GOAL #5   Title Pt will report <2/10 resting pain for R shoulder to promote pain free function so she can sleep through the night   Baseline pt with 5/10 resting R shoulder pain; increase R shoulder muscle soreness   Time 4   Period Weeks   Status Not Met             Patient will benefit from skilled therapeutic intervention in order to improve the following deficits and impairments:     Visit Diagnosis: Muscle weakness (generalized)  Stiffness of right shoulder, not elsewhere classified  Joint stiffness of elbow, right  Chronic right shoulder  pain     Problem List Patient Active Problem List   Diagnosis Date Noted  . Vertigo 07/28/2016  . Neutropenia ([place]) 10/09/2015  . Health care maintenance 11/21/2014  . History of colonic polyps 11/21/2014  . Dysphagia 11/19/2014  . Trochanteric bursitis of left hip 10/08/2014  . URI (upper respiratory infection) 02/10/2014  . Pelvic pain in female 12/25/2013  . Personal history of ovarian cyst 12/25/2013  . Change in bowel movement 11/11/2013  . breast tenderness 09/29/2013  . Dysphagia, unspecified(787.20) 06/12/2012  . Barrett's esophagus 06/12/2012  . Environmental allergies 02/13/2012  . Enlarged thyroid 02/13/2012  . GERD (gastroesophageal reflux disease) 02/08/2012  . Leukopenia 02/08/2012  . Fatigue 02/08/2012  . Hypercholesterolemia 02/08/2012    Pura Spice 08/05/2016, 5:54 PM  [facility] [address] [place], [place], [address] Phone: [phone]   Fax:  [phone]  Name: Kelly Massey MRN: [id number] Date of Birth: [birth year]

## 2016-08-10 ENCOUNTER — Inpatient Hospital Stay: Payer: 59

## 2016-08-10 ENCOUNTER — Inpatient Hospital Stay: Payer: 59 | Attending: Internal Medicine | Admitting: Internal Medicine

## 2016-08-10 DIAGNOSIS — Z886 Allergy status to analgesic agent status: Secondary | ICD-10-CM | POA: Insufficient documentation

## 2016-08-10 DIAGNOSIS — D708 Other neutropenia: Secondary | ICD-10-CM

## 2016-08-10 DIAGNOSIS — D72819 Decreased white blood cell count, unspecified: Secondary | ICD-10-CM | POA: Insufficient documentation

## 2016-08-10 DIAGNOSIS — Z880 Allergy status to penicillin: Secondary | ICD-10-CM

## 2016-08-10 DIAGNOSIS — Z79899 Other long term (current) drug therapy: Secondary | ICD-10-CM | POA: Insufficient documentation

## 2016-08-10 DIAGNOSIS — R42 Dizziness and giddiness: Secondary | ICD-10-CM | POA: Diagnosis not present

## 2016-08-10 DIAGNOSIS — K219 Gastro-esophageal reflux disease without esophagitis: Secondary | ICD-10-CM | POA: Diagnosis not present

## 2016-08-10 DIAGNOSIS — Z885 Allergy status to narcotic agent status: Secondary | ICD-10-CM | POA: Insufficient documentation

## 2016-08-10 DIAGNOSIS — E785 Hyperlipidemia, unspecified: Secondary | ICD-10-CM | POA: Diagnosis not present

## 2016-08-10 DIAGNOSIS — Z800 Family history of malignant neoplasm of digestive organs: Secondary | ICD-10-CM | POA: Diagnosis not present

## 2016-08-10 DIAGNOSIS — D709 Neutropenia, unspecified: Secondary | ICD-10-CM | POA: Diagnosis not present

## 2016-08-10 DIAGNOSIS — Z801 Family history of malignant neoplasm of trachea, bronchus and lung: Secondary | ICD-10-CM | POA: Insufficient documentation

## 2016-08-10 DIAGNOSIS — R51 Headache: Secondary | ICD-10-CM | POA: Diagnosis not present

## 2016-08-10 LAB — CBC WITH DIFFERENTIAL/PLATELET
Basophils Absolute: 0 10*3/uL (ref 0–0.1)
Basophils Relative: 1 %
Eosinophils Absolute: 0 10*3/uL (ref 0–0.7)
Eosinophils Relative: 1 %
HCT: 41.2 % (ref 35.0–47.0)
Hemoglobin: 13.6 g/dL (ref 12.0–16.0)
Lymphocytes Relative: 45 %
Lymphs Abs: 1.5 10*3/uL (ref 1.0–3.6)
MCH: 29.9 pg (ref 26.0–34.0)
MCHC: 33 g/dL (ref 32.0–36.0)
MCV: 90.6 fL (ref 80.0–100.0)
Monocytes Absolute: 0.4 10*3/uL (ref 0.2–0.9)
Monocytes Relative: 12 %
Neutro Abs: 1.4 10*3/uL (ref 1.4–6.5)
Neutrophils Relative %: 41 %
Platelets: 234 10*3/uL (ref 150–440)
RBC: 4.54 MIL/uL (ref 3.80–5.20)
RDW: 13.9 % (ref 11.5–14.5)
WBC: 3.4 10*3/uL — ABNORMAL LOW (ref 3.6–11.0)

## 2016-08-10 NOTE — Progress Notes (Signed)
[facility] CONSULT NOTE  Patient Care Team: Einar Pheasant, MD as PCP - General (Unknown Physician Specialty)  CHIEF COMPLAINTS/PURPOSE OF CONSULTATION:  Leucopenia  HISTORY OF PRESENTING ILLNESS:  Kelly Massey Pulse 51 y.o.  female with prior history of intermittent leukopenia/neutropenia [? Related to dexilant] is here for follow-up.   Patient denies any frequent infections; denies any frequent use of antibiotics. Denies any new medications. Denies any alcohol.   Patient stopped taking dexilant- approximately 10 days ago. She is currently on Zantac. She feels her reflux is well-controlled on Zantac at this time.  ROS: A complete 10 point review of system is done which is negative except mentioned above in history of present illness  MEDICAL HISTORY:  Past Medical History:  Diagnosis Date  . Allergy   . Anemia   . Arthritis    knees - no meds  . Asthma   . Colon polyps   . GERD (gastroesophageal reflux disease) 10/12/10   EGD, positive H. pylori  . HSV infection    History  . Hyperlipidemia    ? no meds - diet controlled  . Post-operative nausea and vomiting   . Seasonal allergies     SURGICAL HISTORY: Past Surgical History:  Procedure Laterality Date  . ABDOMINAL HYSTERECTOMY    . BALLOON DILATION N/A 07/24/2012   Procedure: BALLOON DILATION;  Surgeon: Inda Castle, MD;  Location: [facility];  Service: Endoscopy;  Laterality: N/A;  . BRAVO [place] STUDY N/A 07/24/2012   Procedure: BRAVO [place] STUDY;  Surgeon: Inda Castle, MD;  [facility];  Service: Endoscopy;  Laterality: N/A;  . DILATION AND CURETTAGE OF UTERUS     SAB  . ESOPHAGOGASTRODUODENOSCOPY N/A 07/24/2012   Procedure: ESOPHAGOGASTRODUODENOSCOPY (EGD);  Surgeon: Inda Castle, MD;  Location: [facility];  Service: Endoscopy;  Laterality: N/A;  . OVARIAN CYST REMOVAL  2004   laparotomy -left  . ROBOTIC ASSISTED LAPAROSCOPIC LYSIS OF ADHESION N/A 03/21/2014   Procedure: ROBOTIC  ASSISTED LAPAROSCOPIC EXTENSIVE LYSIS OF ADHESION (1 Hour);  Surgeon: Marvene Staff, MD;  Location: [facility];  Service: Gynecology;  Laterality: N/A;  . ROBOTIC ASSISTED SALPINGO OOPHERECTOMY Left 03/21/2014   Procedure:  ROBOTIC ASSISTED LEFT OOPHORECTOMY;  Surgeon: Marvene Staff, MD;  Location: [facility];  Service: Gynecology;  Laterality: Left;  . SHOULDER ARTHROSCOPY Left   . TENNIS ELBOW RELEASE/NIRSCHEL PROCEDURE Right 05/01/2015   Procedure: RIGHT ELBOW DEBRIDEMENT AND TENDON REPAIR;  Surgeon: [name], MD;  Location: [facility];  Service: Orthopedics;  Laterality: Right;  . WISDOM TOOTH EXTRACTION      SOCIAL HISTORY: Social History   Social History  . Marital status: Married    Spouse name: N/A  . Number of children: 0  . 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 Smoking status: Never Smoker  . Smokeless tobacco: Never Used  . Alcohol use No  . Drug use: No  . Sexual activity: Yes    Birth control/ protection: None     Comment: hysterectomy   Other Topics Concern  . Not on file   Social History Narrative  . No narrative on file    FAMILY HISTORY: Family History  Problem Relation Age of Onset  . Arthritis Mother   . Stroke Mother   . Hypertension Mother   . Heart failure Mother   . Dementia Mother   . Breast cancer Maternal  Aunt   . Prostate cancer Maternal Uncle     x 2  . Diabetes Sister   . Diabetes Brother     x 2  . Diabetes Paternal Grandmother   . Hypertension Other   . Hyperlipidemia Other   . Diabetes Paternal Grandfather   . Lung cancer Father   . Diabetes Maternal Uncle   . Throat cancer Maternal Aunt     Smoker  . Colon cancer Neg Hx     ALLERGIES:  is allergic to adhesive [tape]; aspirin; ibuprofen; penicillins; nsaids; oxycodone; and shrimp [shellfish allergy].  MEDICATIONS:  Current Outpatient Prescriptions  Medication Sig Dispense  Refill  . Calcium Carbonate-Vitamin D (CALCIUM 600+D3) 600-400 MG-UNIT per tablet Take 1 tablet by mouth daily.    . ferrous sulfate 325 (65 FE) MG tablet Take 325 mg by mouth daily.     . Multiple Vitamins-Minerals (MULTIVITAMIN WITH MINERALS) tablet Take 1 tablet by mouth daily.    . ranitidine (ZANTAC) 150 MG tablet Take 300 mg by mouth daily as needed for heartburn.     . vitamin E 400 UNIT capsule Take 400 Units by mouth daily. Reported on 10/09/2015    . DEXILANT 60 MG capsule TAKE 1 CAPSULE (60 MG TOTAL) BY MOUTH DAILY. (Patient not taking: Reported on 08/10/2016) 30 capsule 8  . mometasone (NASONEX) 50 MCG/ACT nasal spray PLACE 2 SPRAYS INTO THE NOSE DAILY. (Patient not taking: Reported on 08/10/2016) 51 g 3   No current facility-administered medications for this visit.       Marland Kitchen  PHYSICAL EXAMINATION:   Vitals:   08/10/16 1342  BP: 105/80  Pulse: 81  Resp: 18  Temp: 98.5 F (36.9 C)   Filed Weights   08/10/16 1342  Weight: 198 lb (89.8 kg)    GENERAL: Well-nourished well-developed; Alert, no distress and comfortable.  Alone.  EYES: no pallor or icterus OROPHARYNX: no thrush or ulceration; good dentition  NECK: supple, no masses felt LYMPH:  no palpable lymphadenopathy in the cervical, axillary or inguinal regions LUNGS: clear to auscultation and  No wheeze or crackles HEART/CVS: regular rate & rhythm and no murmurs; No lower extremity edema ABDOMEN: abdomen soft, non-tender and normal bowel sounds Musculoskeletal:no cyanosis of digits and no clubbing  PSYCH: alert & oriented x 3 with fluent speech NEURO: no focal motor/sensory deficits SKIN:  no rashes or significant lesions  LABORATORY DATA:  I have reviewed the data as listed Lab Results  Component Value Date   WBC 3.4 (L) 08/10/2016   HGB 13.6 08/10/2016   HCT 41.2 08/10/2016   MCV 90.6 08/10/2016   PLT 234 08/10/2016    Recent Labs  10/09/15 1500 07/28/16 1029  NA 136 136  K 4.5 4.6  CL 101 102  CO2  30 29  GLUCOSE 88 90  BUN 9 7  CREATININE 0.75 0.72  CALCIUM 9.3 9.5  GFRNONAA >60  --   GFRAA >60  --   PROT 7.6 7.0  ALBUMIN 4.6 4.3  AST 21 14  ALT 20 10  ALKPHOS 50 44  BILITOT 0.4 0.4    RADIOGRAPHIC STUDIES: I have personally reviewed the radiological images as listed and agreed with the findings in the report. Mr Jeri Cos Wo Contrast  Result Date: 08/11/2016 CLINICAL DATA:  Vertigo and dizziness for 3 weeks. 2-3 episodes per day. Occasional headaches. Tinnitus. EXAM: MRI HEAD WITHOUT AND WITH CONTRAST TECHNIQUE: Multiplanar, multiecho pulse sequences of the brain and surrounding structures were obtained without and  with intravenous contrast. CONTRAST:  29m MULTIHANCE GADOBENATE DIMEGLUMINE 529 MG/ML IV SOLN COMPARISON:  None. FINDINGS: Brain: No acute infarct, hemorrhage, or mass lesion is present. The ventricles are of normal size. No significant extraaxial fluid collection is present. No significant white matter disease is present. The brainstem and cerebellum are within normal limits. The internal auditory canals are normal bilaterally. The postcontrast images demonstrate no pathologic enhancement. A 10 mm pineal cyst is noted without significant and enhancement or solid component. Vascular: Flow is present in the major intracranial arteries. Skull and upper cervical spine: The skullbase is normal. Midline sagittal structures are otherwise unremarkable. Sinuses/Orbits: The paranasal sinuses and mastoid air cells are clear. The globes and orbits are within normal limits.Negative. IMPRESSION: Negative MRI of the brain. No acute or focal lesion to explain the patient's vertigo or dizziness. Electronically Signed   By: CSan MorelleM.D.   On: 08/11/2016 16:46    ASSESSMENT & PLAN:  Other neutropenia (H[facility]) Mild leukopenia -intermittent neutropenia nadir 800 ANC. Question related to Dexilant- as her counts improve when she is off the medication. Patient is currently on Zantac.  Today her white count is 3.4 with ANC of 1400.   # Discussed regarding bone marrow biopsy; however I do not think this would add much to her care at this time. However bone marrow biopsy would be recommended if significant drop in her counts noted. Recommend follow-up labs in 6 months patient agrees.  # follow up in 6 months/labs.   Cc; Dr.scott.          GCammie Sickle MD 08/11/2016 5:26 PM

## 2016-08-10 NOTE — Assessment & Plan Note (Addendum)
Mild leukopenia -intermittent neutropenia nadir 800 ANC. Question related to Dexilant- as her counts improve when she is off the medication. Patient is currently on Zantac. Today her white count is 3.4 with ANC of 1400.   # Discussed regarding bone marrow biopsy; however I do not think this would add much to her care at this time. However bone marrow biopsy would be recommended if significant drop in her counts noted. Recommend follow-up labs in 6 months patient agrees.  # follow up in 6 months/labs.   Cc; Dr.scott.

## 2016-08-11 ENCOUNTER — Ambulatory Visit
Admission: RE | Admit: 2016-08-11 | Discharge: 2016-08-11 | Disposition: A | Discharge status: Home / Self Care | Payer: 59 | Source: Ambulatory Visit | Attending: Family | Admitting: Family

## 2016-08-11 ENCOUNTER — Ambulatory Visit: Payer: 59 | Admitting: Physical Therapy

## 2016-08-11 DIAGNOSIS — R42 Dizziness and giddiness: Secondary | ICD-10-CM | POA: Insufficient documentation

## 2016-08-11 DIAGNOSIS — M6281 Muscle weakness (generalized): Secondary | ICD-10-CM

## 2016-08-11 DIAGNOSIS — M25621 Stiffness of right elbow, not elsewhere classified: Secondary | ICD-10-CM

## 2016-08-11 DIAGNOSIS — M25511 Pain in right shoulder: Secondary | ICD-10-CM

## 2016-08-11 DIAGNOSIS — M25611 Stiffness of right shoulder, not elsewhere classified: Secondary | ICD-10-CM

## 2016-08-11 DIAGNOSIS — G8929 Other chronic pain: Secondary | ICD-10-CM

## 2016-08-11 MED ORDER — GADOBENATE DIMEGLUMINE 529 MG/ML IV SOLN
18.0000 mL | Freq: Once | INTRAVENOUS | Status: AC | PRN
  Administered 2016-08-11: 18 mL via INTRAVENOUS

## 2016-08-12 NOTE — Therapy (Signed)
[facility] [address] [place], [place], [address] Phone: [phone]   Fax:  [phone]  Physical Therapy Treatment  Patient Details  Name: STARLETTA HOUCHIN MRN: [id number] Date of Birth: [birth year] Referring Provider: [name] MD  Encounter Date: 08/11/2016    Past Medical History:  Diagnosis Date  . Allergy   . Anemia   . Arthritis    knees - no meds  . Asthma   . Colon polyps   . GERD (gastroesophageal reflux disease) 10/12/10   EGD, positive H. pylori  . HSV infection    History  . Hyperlipidemia    ? no meds - diet controlled  . Post-operative nausea and vomiting   . Seasonal allergies     Past Surgical History:  Procedure Laterality Date  . ABDOMINAL HYSTERECTOMY    . BALLOON DILATION N/A 07/24/2012   Procedure: BALLOON DILATION;  Surgeon: Inda Castle, MD;  Location: [facility];  Service: Endoscopy;  Laterality: N/A;  . BRAVO [place] STUDY N/A 07/24/2012   Procedure: BRAVO [place] STUDY;  Surgeon: Inda Castle, MD;  [facility];  Service: Endoscopy;  Laterality: N/A;  . DILATION AND CURETTAGE OF UTERUS     SAB  . ESOPHAGOGASTRODUODENOSCOPY N/A 07/24/2012   Procedure: ESOPHAGOGASTRODUODENOSCOPY (EGD);  Surgeon: Inda Castle, MD;  Location: [facility];  Service: Endoscopy;  Laterality: N/A;  . OVARIAN CYST REMOVAL  2004   laparotomy -left  . ROBOTIC ASSISTED LAPAROSCOPIC LYSIS OF ADHESION N/A 03/21/2014   Procedure: ROBOTIC ASSISTED LAPAROSCOPIC EXTENSIVE LYSIS OF ADHESION (1 Hour);  Surgeon: Marvene Staff, MD;  Location: [facility];  Service: Gynecology;  Laterality: N/A;  . ROBOTIC ASSISTED SALPINGO OOPHERECTOMY Left 03/21/2014   Procedure:  ROBOTIC ASSISTED LEFT OOPHORECTOMY;  Surgeon: Marvene Staff, MD;  Location: [facility];  Service: Gynecology;  Laterality: Left;  . SHOULDER ARTHROSCOPY Left   . TENNIS ELBOW RELEASE/NIRSCHEL PROCEDURE Right 05/01/2015   Procedure: RIGHT ELBOW  DEBRIDEMENT AND TENDON REPAIR;  Surgeon: [name], MD;  Location: [facility];  Service: Orthopedics;  Laterality: Right;  . WISDOM TOOTH EXTRACTION      There were no vitals filed for this visit.                                    PT Long Term Goals - 04/09/16 1232      PT LONG TERM GOAL #1   Title Pt will be able to achieve full R elbow flexion/extension with <2/10 pain to promote return to PLOF   Baseline R elbow AROM WNL but slight pain.   Time 4   Period Weeks   Status Achieved     PT LONG TERM GOAL #2   Title Pt will improve self-perceived QuickDash score to <35% to promote increase in functional mobility so she can perform household tasks.   Baseline 04/08/16: 65.9% (slight improvement)   Time 4   Period Weeks   Status New     PT LONG TERM GOAL #3   Title Pt will increase R shoulder flexion/abduction AROM to >140, >161 respectively so that she can perform overhead lifting tasks    Time 4   Period Weeks   Status Not Met     PT LONG TERM GOAL #4   Title Pt will demonstrate equivalent R UE strength as compared to L UE strength to promote  return to PLOF so she can perform work tasks   Baseline R UE strength grossly 3/5 MMT (ROM limitations), LUE shoulder flexion 4+, abduction 4+, biceps/triceps 5   Time 4   Period Weeks   Status Not Met     PT LONG TERM GOAL #5   Title Pt will report <2/10 resting pain for R shoulder to promote pain free function so she can sleep through the night   Baseline pt with 5/10 resting R shoulder pain; increase R shoulder muscle soreness   Time 4   Period Weeks   Status Not Met             Patient will benefit from skilled therapeutic intervention in order to improve the following deficits and impairments:     Visit Diagnosis: Muscle weakness (generalized)  Stiffness of right shoulder, not elsewhere classified  Joint stiffness of elbow, right  Chronic right shoulder  pain     Problem List Patient Active Problem List   Diagnosis Date Noted  . Other neutropenia ([place]) 08/10/2016  . Vertigo 07/28/2016  . Health care maintenance 11/21/2014  . History of colonic polyps 11/21/2014  . Dysphagia 11/19/2014  . Trochanteric bursitis of left hip 10/08/2014  . URI (upper respiratory infection) 02/10/2014  . Pelvic pain in female 12/25/2013  . Personal history of ovarian cyst 12/25/2013  . Change in bowel movement 11/11/2013  . breast tenderness 09/29/2013  . Dysphagia, unspecified(787.20) 06/12/2012  . Barrett's esophagus 06/12/2012  . Environmental allergies 02/13/2012  . Enlarged thyroid 02/13/2012  . GERD (gastroesophageal reflux disease) 02/08/2012  . Leukopenia 02/08/2012  . Fatigue 02/08/2012  . Hypercholesterolemia 02/08/2012    Pura Spice 08/12/2016, 1:53 PM  [facility] [address] [place], [place], [address] Phone: [phone]   Fax:  [phone]  Name: SAMUELLA RASOOL MRN: [id number] Date of Birth: 07-29-1965

## 2016-08-13 ENCOUNTER — Telehealth: Payer: Self-pay | Admitting: Internal Medicine

## 2016-08-13 NOTE — Telephone Encounter (Signed)
See lab result message.  

## 2016-08-13 NOTE — Telephone Encounter (Signed)
Pt called back returning your call. Please advise, thank you!  Call pt @ [phone]

## 2016-08-16 ENCOUNTER — Ambulatory Visit (INDEPENDENT_AMBULATORY_CARE_PROVIDER_SITE_OTHER): Payer: 59 | Admitting: Internal Medicine

## 2016-08-16 ENCOUNTER — Telehealth: Payer: Self-pay | Admitting: *Deleted

## 2016-08-16 ENCOUNTER — Encounter: Payer: Self-pay | Admitting: Internal Medicine

## 2016-08-16 VITALS — BP 108/76 | HR 85 | Temp 98.5°F | Resp 12 | Ht 66.0 in | Wt 196.0 lb

## 2016-08-16 DIAGNOSIS — D72819 Decreased white blood cell count, unspecified: Secondary | ICD-10-CM

## 2016-08-16 DIAGNOSIS — E7800 Pure hypercholesterolemia, unspecified: Secondary | ICD-10-CM | POA: Diagnosis not present

## 2016-08-16 DIAGNOSIS — Z860100 Personal history of colon polyps, unspecified: Secondary | ICD-10-CM

## 2016-08-16 DIAGNOSIS — Z0000 Encounter for general adult medical examination without abnormal findings: Secondary | ICD-10-CM

## 2016-08-16 DIAGNOSIS — K2100 Gastro-esophageal reflux disease with esophagitis, without bleeding: Secondary | ICD-10-CM

## 2016-08-16 DIAGNOSIS — Z9109 Other allergy status, other than to drugs and biological substances: Secondary | ICD-10-CM

## 2016-08-16 DIAGNOSIS — R42 Dizziness and giddiness: Secondary | ICD-10-CM

## 2016-08-16 DIAGNOSIS — K2270 Barrett's esophagus without dysplasia: Secondary | ICD-10-CM | POA: Diagnosis not present

## 2016-08-16 NOTE — Telephone Encounter (Signed)
Patient will need a 8 week follow up, late afternoon preferred. -Thanks  Pt contact [phone]

## 2016-08-16 NOTE — Assessment & Plan Note (Signed)
Physical today 08/16/16.  Is s/p hysterectomy.  Followed by gyn.  Mammogram 08/15/15 - Birads I.  Scheduled for f/u mammogram in 3 days.  Colonoscopy 10/2010.

## 2016-08-16 NOTE — Assessment & Plan Note (Signed)
Colonoscopy 10/2010 - hyperplastic polyps.

## 2016-08-16 NOTE — Progress Notes (Signed)
Patient ID: RHESA FORSBERG, female   DOB: Oct 15, 1965, 51 y.o.   MRN: [id number]   Subjective:    Patient ID: KELISSA MERLIN, female    DOB: 09/21/1965, 51 y.o.   MRN: [id number]  HPI  Patient here for her physical exam.  She was having some issues with dizziness.  Saw Mable Paris.  Note reviewed.  Had MRI.  Unrevealing.  States dizziness is better now.  Not an issue for her now.  She does report increased nasal pressure.  Some congestion and discomfort in her nose.  Some mucus production.  Is clear.  No chest congestion.  No cough.  Acid reflux controlled now on zantac.  Just saw Dr Rogue Bussing.  Felt counts stable.  Recommended a 6 month f/u.  She also reports some pain in her left elbow.  Aggravated by pushing against an object and with rotation of her forearm.  Also, some discomfort in her left posterior shoulder.  Was concerned regarding increased fullness.  Good rom.  No significant pain with rotation of her neck.  Trying to adjust her pillow.  Also having issues with her right knee.  Has been evaluated by ortho.  They are monitoring.     Past Medical History:  Diagnosis Date  . Allergy   . Anemia   . Arthritis    knees - no meds  . Asthma   . Colon polyps   . GERD (gastroesophageal reflux disease) 10/12/10   EGD, positive H. pylori  . HSV infection    History  . Hyperlipidemia    ? no meds - diet controlled  . Post-operative nausea and vomiting   . Seasonal allergies    Past Surgical History:  Procedure Laterality Date  . ABDOMINAL HYSTERECTOMY    . BALLOON DILATION N/A 07/24/2012   Procedure: BALLOON DILATION;  Surgeon: Inda Castle, MD;  Location: [facility];  Service: Endoscopy;  Laterality: N/A;  . BRAVO [place] STUDY N/A 07/24/2012   Procedure: BRAVO [place] STUDY;  Surgeon: Inda Castle, MD;  [facility]: [facility];  Service: Endoscopy;  Laterality: N/A;  . DILATION AND CURETTAGE OF UTERUS     SAB  . ESOPHAGOGASTRODUODENOSCOPY N/A 07/24/2012   Procedure:  ESOPHAGOGASTRODUODENOSCOPY (EGD);  Surgeon: Inda Castle, MD;  Location: [facility];  Service: Endoscopy;  Laterality: N/A;  . OVARIAN CYST REMOVAL  2004   laparotomy -left  . ROBOTIC ASSISTED LAPAROSCOPIC LYSIS OF ADHESION N/A 03/21/2014   Procedure: ROBOTIC ASSISTED LAPAROSCOPIC EXTENSIVE LYSIS OF ADHESION (1 Hour);  Surgeon: Marvene Staff, MD;  Location: [facility];  Service: Gynecology;  Laterality: N/A;  . ROBOTIC ASSISTED SALPINGO OOPHERECTOMY Left 03/21/2014   Procedure:  ROBOTIC ASSISTED LEFT OOPHORECTOMY;  Surgeon: Marvene Staff, MD;  Location: [facility];  Service: Gynecology;  Laterality: Left;  . SHOULDER ARTHROSCOPY Left   . TENNIS ELBOW RELEASE/NIRSCHEL PROCEDURE Right 05/01/2015   Procedure: RIGHT ELBOW DEBRIDEMENT AND TENDON REPAIR;  Surgeon: [name], MD;  Location: [facility];  Service: Orthopedics;  Laterality: Right;  . WISDOM TOOTH EXTRACTION     Family History  Problem Relation Age of Onset  . Arthritis Mother   . Stroke Mother   . Hypertension Mother   . Heart failure Mother   . Dementia Mother   . Breast cancer Maternal Aunt   . Prostate cancer Maternal Uncle        x 2  . Diabetes Sister   . Diabetes Brother  x 2  . Diabetes Paternal Grandmother   . Hypertension Other   . Hyperlipidemia Other   . Diabetes Paternal Grandfather   . Lung cancer Father   . Diabetes Maternal Uncle   . Throat cancer Maternal Aunt        Smoker  . Colon cancer Neg Hx    Social History   Social History  . Marital status: Married    Spouse name: N/A  . Number of children: 0  . 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 Smoking status: Never Smoker  . Smokeless tobacco: Never Used  . Alcohol use No  . Drug use: No  . Sexual activity: Yes    Birth control/ protection: None     Comment: hysterectomy   Other Topics Concern  . None   Social History Narrative  .  None    Outpatient Encounter Prescriptions as of 08/16/2016  Medication Sig  . Calcium Carbonate-Vitamin D (CALCIUM 600+D3) 600-400 MG-UNIT per tablet Take 1 tablet by mouth daily.  . ferrous sulfate 325 (65 FE) MG tablet Take 325 mg by mouth daily.   . mometasone (NASONEX) 50 MCG/ACT nasal spray PLACE 2 SPRAYS INTO THE NOSE DAILY.  . Multiple Vitamins-Minerals (MULTIVITAMIN WITH MINERALS) tablet Take 1 tablet by mouth daily.  . ranitidine (ZANTAC) 150 MG tablet Take 300 mg by mouth daily as needed for heartburn.   . vitamin E 400 UNIT capsule Take 400 Units by mouth daily. Reported on 10/09/2015  . [DISCONTINUED] DEXILANT 60 MG capsule TAKE 1 CAPSULE (60 MG TOTAL) BY MOUTH DAILY. (Patient not taking: Reported on 08/10/2016)   No facility-administered encounter medications on file as of 08/16/2016.     Review of Systems  Constitutional: Negative for appetite change and unexpected weight change.  HENT: Positive for congestion and postnasal drip. Negative for sore throat.   Eyes: Negative for pain and visual disturbance.  Respiratory: Negative for cough, chest tightness and shortness of breath.   Cardiovascular: Negative for chest pain, palpitations and leg swelling.  Gastrointestinal: Negative for abdominal pain, diarrhea, nausea and vomiting.  Genitourinary: Negative for difficulty urinating and dysuria.  Musculoskeletal: Negative for joint swelling.       Shoulder and elbow pain as outlined. Knee pain as outlined.    Skin: Negative for color change and rash.  Neurological: Positive for dizziness. Negative for headaches.  Hematological: Negative for adenopathy. Does not bruise/bleed easily.  Psychiatric/Behavioral: Negative for agitation and dysphoric mood.       Objective:    Physical Exam  Constitutional: She is oriented to person, place, and time. She appears well-developed and well-nourished. No distress.  HENT:  Mouth/Throat: Oropharynx is clear and moist.  Slightly erythematous  turbinates.  No significant tenderness to palpation over the sinuses.  TMs without erythema.    Eyes: Right eye exhibits no discharge. Left eye exhibits no discharge. No scleral icterus.  Neck: Neck supple. No thyromegaly present.  Cardiovascular: Normal rate and regular rhythm.   Pulmonary/Chest: Breath sounds normal. No accessory muscle usage. No tachypnea. No respiratory distress. She has no decreased breath sounds. She has no wheezes. She has no rhonchi. Right breast exhibits no inverted nipple, no mass, no nipple discharge and no tenderness (no axillary adenopathy). Left breast exhibits no inverted nipple, no mass, no nipple discharge and no tenderness (no axilarry adenopathy).  Abdominal: Soft. Bowel sounds are normal. There is no tenderness.  Genitourinary:  Genitourinary Comments: Performed by gyn.   Musculoskeletal: She exhibits no edema or tenderness.  Lymphadenopathy:    She has no cervical adenopathy.  Neurological: She is alert and oriented to person, place, and time.  Skin: Skin is warm. No rash noted. No erythema.  Psychiatric: She has a normal mood and affect. Her behavior is normal.    BP 108/76 (BP Location: Left Arm, Patient Position: Sitting, Cuff Size: Normal)   Pulse 85   Temp 98.5 F (36.9 C) (Oral)   Resp 12   Ht 5\' 6"  (1.676 m)   Wt 196 lb (88.9 kg)   LMP 10/06/2011   SpO2 96%   BMI 31.64 kg/m  Wt Readings from Last 3 Encounters:  08/16/16 196 lb (88.9 kg)  08/10/16 198 lb (89.8 kg)  07/28/16 198 lb (89.8 kg)     Lab Results  Component Value Date   WBC 3.4 (L) 08/10/2016   HGB 13.6 08/10/2016   HCT 41.2 08/10/2016   PLT 234 08/10/2016   GLUCOSE 90 07/28/2016   CHOL 200 08/06/2015   TRIG 93.0 08/06/2015   HDL 67.70 08/06/2015   LDLCALC 114 (H) 08/06/2015   ALT 10 07/28/2016   AST 14 07/28/2016   NA 136 07/28/2016   K 4.6 07/28/2016   CL 102 07/28/2016   CREATININE 0.72 07/28/2016   BUN 7 07/28/2016   CO2 29 07/28/2016   TSH 0.76 07/28/2016      Mr Brain W Wo Contrast  Result Date: 08/11/2016 CLINICAL DATA:  Vertigo and dizziness for 3 weeks. 2-3 episodes per day. Occasional headaches. Tinnitus. EXAM: MRI HEAD WITHOUT AND WITH CONTRAST TECHNIQUE: Multiplanar, multiecho pulse sequences of the brain and surrounding structures were obtained without and with intravenous contrast. CONTRAST:  2mL MULTIHANCE GADOBENATE DIMEGLUMINE 529 MG/ML IV SOLN COMPARISON:  None. FINDINGS: Brain: No acute infarct, hemorrhage, or mass lesion is present. The ventricles are of normal size. No significant extraaxial fluid collection is present. No significant white matter disease is present. The brainstem and cerebellum are within normal limits. The internal auditory canals are normal bilaterally. The postcontrast images demonstrate no pathologic enhancement. A 10 mm pineal cyst is noted without significant and enhancement or solid component. Vascular: Flow is present in the major intracranial arteries. Skull and upper cervical spine: The skullbase is normal. Midline sagittal structures are otherwise unremarkable. Sinuses/Orbits: The paranasal sinuses and mastoid air cells are clear. The globes and orbits are within normal limits.Negative. IMPRESSION: Negative MRI of the brain. No acute or focal lesion to explain the patient's vertigo or dizziness. Electronically Signed   By: San Morelle M.D.   On: 08/11/2016 16:46       Assessment & Plan:   Problem List Items Addressed This Visit    Barrett's esophagus    Followed by GI.  Symptoms controlled on zantac.  Concern over dexilant contributing to leukopenia.        Environmental allergies    Increased congestion as outlined.  Treat with saline nasal spray and nasacort nasal spray as outlined.  Follow.  Notify me if worsening.      GERD (gastroesophageal reflux disease)    Controlled on zantac.        Health care maintenance    Physical today 08/16/16.  Is s/p hysterectomy.  Followed by gyn.   Mammogram 08/15/15 - Birads I.  Scheduled for f/u mammogram in 3 days.  Colonoscopy 10/2010.        History of colonic polyps    Colonoscopy  10/2010 - hyperplastic polyps.        Hypercholesterolemia    Low cholesterol diet and exercise.  Follow lipid panel.        Leukopenia    w/up by hematology.  Felt possibly to be related to dexilant.  Just reevaluated.  Felt stable.  On zantac.  Follow.        Vertigo    See Joycelyn Schmid Arnett's note.  MRI negative as outlined.  Better now.  Hold on any further testing or evaluation at this time.  Follow.            Einar Pheasant, MD

## 2016-08-16 NOTE — Assessment & Plan Note (Signed)
w/up by hematology.  Felt possibly to be related to dexilant.  Just reevaluated.  Felt stable.  On zantac.  Follow.

## 2016-08-16 NOTE — Assessment & Plan Note (Signed)
Low cholesterol diet and exercise.  Follow lipid panel.   

## 2016-08-16 NOTE — Patient Instructions (Signed)
Saline nasal spray - flush nose at least 2-3x/day  nasacort nasal spray - 2 sprays each nostril one time per day.  Do this in the evening.  

## 2016-08-16 NOTE — Progress Notes (Signed)
Pre-visit discussion using our clinic review tool. No additional management support is needed unless otherwise documented below in the visit note.  

## 2016-08-16 NOTE — Assessment & Plan Note (Signed)
See Kelly Massey's note.  MRI negative as outlined.  Better now.  Hold on any further testing or evaluation at this time.  Follow.

## 2016-08-17 ENCOUNTER — Ambulatory Visit: Payer: 59

## 2016-08-18 ENCOUNTER — Encounter: Payer: 59 | Admitting: Physical Therapy

## 2016-08-19 ENCOUNTER — Ambulatory Visit
Admission: RE | Admit: 2016-08-19 | Discharge: 2016-08-19 | Disposition: A | Discharge status: Home / Self Care | Payer: 59 | Source: Ambulatory Visit | Attending: Internal Medicine | Admitting: Internal Medicine

## 2016-08-19 DIAGNOSIS — L669 Cicatricial alopecia, unspecified: Secondary | ICD-10-CM | POA: Diagnosis not present

## 2016-08-19 DIAGNOSIS — L219 Seborrheic dermatitis, unspecified: Secondary | ICD-10-CM | POA: Diagnosis not present

## 2016-08-19 DIAGNOSIS — Z1231 Encounter for screening mammogram for malignant neoplasm of breast: Secondary | ICD-10-CM | POA: Diagnosis not present

## 2016-08-19 NOTE — Telephone Encounter (Signed)
appt scheduled 10/29/16 @ 3

## 2016-08-25 ENCOUNTER — Ambulatory Visit: Payer: 59 | Admitting: Physical Therapy

## 2016-08-25 DIAGNOSIS — M6281 Muscle weakness (generalized): Secondary | ICD-10-CM | POA: Diagnosis not present

## 2016-08-25 DIAGNOSIS — M25611 Stiffness of right shoulder, not elsewhere classified: Secondary | ICD-10-CM

## 2016-08-25 DIAGNOSIS — G8929 Other chronic pain: Secondary | ICD-10-CM

## 2016-08-25 DIAGNOSIS — M25621 Stiffness of right elbow, not elsewhere classified: Secondary | ICD-10-CM

## 2016-08-25 DIAGNOSIS — M25511 Pain in right shoulder: Secondary | ICD-10-CM

## 2016-08-26 NOTE — Therapy (Signed)
[facility] [address] [place], [place], [address] Phone: [phone]   Fax:  [phone]  Physical Therapy Treatment  Patient Details  Name: Kelly Massey MRN: [id number] Date of Birth: 1965-07-26 Referring Provider: [name] MD  Encounter Date: 08/25/2016    Past Medical History:  Diagnosis Date  . Allergy   . Anemia   . Arthritis    knees - no meds  . Asthma   . Colon polyps   . GERD (gastroesophageal reflux disease) 10/12/10   EGD, positive H. pylori  . HSV infection    History  . Hyperlipidemia    ? no meds - diet controlled  . Post-operative nausea and vomiting   . Seasonal allergies     Past Surgical History:  Procedure Laterality Date  . ABDOMINAL HYSTERECTOMY    . BALLOON DILATION N/A 07/24/2012   Procedure: BALLOON DILATION;  Surgeon: Inda Castle, MD;  Location: [facility];  Service: Endoscopy;  Laterality: N/A;  . BRAVO [place] STUDY N/A 07/24/2012   Procedure: BRAVO [place] STUDY;  Surgeon: Inda Castle, MD;  [facility]: [facility];  Service: Endoscopy;  Laterality: N/A;  . DILATION AND CURETTAGE OF UTERUS     SAB  . ESOPHAGOGASTRODUODENOSCOPY N/A 07/24/2012   Procedure: ESOPHAGOGASTRODUODENOSCOPY (EGD);  Surgeon: Inda Castle, MD;  Location: [facility];  Service: Endoscopy;  Laterality: N/A;  . OVARIAN CYST REMOVAL  2004   laparotomy -left  . ROBOTIC ASSISTED LAPAROSCOPIC LYSIS OF ADHESION N/A 03/21/2014   Procedure: ROBOTIC ASSISTED LAPAROSCOPIC EXTENSIVE LYSIS OF ADHESION (1 Hour);  Surgeon: Marvene Staff, MD;  Location: [facility];  Service: Gynecology;  Laterality: N/A;  . ROBOTIC ASSISTED SALPINGO OOPHERECTOMY Left 03/21/2014   Procedure:  ROBOTIC ASSISTED LEFT OOPHORECTOMY;  Surgeon: Marvene Staff, MD;  Location: [facility];  Service: Gynecology;  Laterality: Left;  . SHOULDER ARTHROSCOPY Left   . TENNIS ELBOW RELEASE/NIRSCHEL PROCEDURE Right 05/01/2015   Procedure: RIGHT ELBOW  DEBRIDEMENT AND TENDON REPAIR;  Surgeon: [name], MD;  Location: [facility];  Service: Orthopedics;  Laterality: Right;  . WISDOM TOOTH EXTRACTION      There were no vitals filed for this visit.                                    PT Long Term Goals - 04/09/16 1232      PT LONG TERM GOAL #1   Title Pt will be able to achieve full R elbow flexion/extension with <2/10 pain to promote return to PLOF   Baseline R elbow AROM WNL but slight pain.   Time 4   Period Weeks   Status Achieved     PT LONG TERM GOAL #2   Title Pt will improve self-perceived QuickDash score to <35% to promote increase in functional mobility so she can perform household tasks.   Baseline 04/08/16: 65.9% (slight improvement)   Time 4   Period Weeks   Status New     PT LONG TERM GOAL #3   Title Pt will increase R shoulder flexion/abduction AROM to >140, >161 respectively so that she can perform overhead lifting tasks    Time 4   Period Weeks   Status Not Met     PT LONG TERM GOAL #4   Title Pt will demonstrate equivalent R UE strength as compared to L UE strength to promote  return to PLOF so she can perform work tasks   Baseline R UE strength grossly 3/5 MMT (ROM limitations), LUE shoulder flexion 4+, abduction 4+, biceps/triceps 5   Time 4   Period Weeks   Status Not Met     PT LONG TERM GOAL #5   Title Pt will report <2/10 resting pain for R shoulder to promote pain free function so she can sleep through the night   Baseline pt with 5/10 resting R shoulder pain; increase R shoulder muscle soreness   Time 4   Period Weeks   Status Not Met             Patient will benefit from skilled therapeutic intervention in order to improve the following deficits and impairments:     Visit Diagnosis: Muscle weakness (generalized)  Stiffness of right shoulder, not elsewhere classified  Joint stiffness of elbow, right  Chronic right shoulder  pain     Problem List Patient Active Problem List   Diagnosis Date Noted  . Other neutropenia ([place]) 08/10/2016  . Vertigo 07/28/2016  . Health care maintenance 11/21/2014  . History of colonic polyps 11/21/2014  . Dysphagia 11/19/2014  . Trochanteric bursitis of left hip 10/08/2014  . URI (upper respiratory infection) 02/10/2014  . Pelvic pain in female 12/25/2013  . Personal history of ovarian cyst 12/25/2013  . Change in bowel movement 11/11/2013  . breast tenderness 09/29/2013  . Dysphagia, unspecified(787.20) 06/12/2012  . Barrett's esophagus 06/12/2012  . Environmental allergies 02/13/2012  . Enlarged thyroid 02/13/2012  . GERD (gastroesophageal reflux disease) 02/08/2012  . Leukopenia 02/08/2012  . Fatigue 02/08/2012  . Hypercholesterolemia 02/08/2012    Pura Spice 08/26/2016, 10:29 AM  [facility] [address] [place], [place], [address] Phone: [phone]   Fax:  [phone]  Name: Kelly Massey MRN: [id number] Date of Birth: Dec 25, 1965

## 2016-08-28 ENCOUNTER — Encounter: Payer: Self-pay | Admitting: Internal Medicine

## 2016-08-28 NOTE — Assessment & Plan Note (Signed)
Controlled on zantac.   

## 2016-08-28 NOTE — Assessment & Plan Note (Signed)
Followed by GI.  Symptoms controlled on zantac.  Concern over dexilant contributing to leukopenia.

## 2016-08-28 NOTE — Assessment & Plan Note (Signed)
Increased congestion as outlined.  Treat with saline nasal spray and nasacort nasal spray as outlined.  Follow.  Notify me if worsening.

## 2016-09-01 ENCOUNTER — Ambulatory Visit: Payer: 59 | Admitting: Physical Therapy

## 2016-09-01 DIAGNOSIS — M6281 Muscle weakness (generalized): Secondary | ICD-10-CM | POA: Diagnosis not present

## 2016-09-01 DIAGNOSIS — M25611 Stiffness of right shoulder, not elsewhere classified: Secondary | ICD-10-CM

## 2016-09-01 DIAGNOSIS — M25511 Pain in right shoulder: Secondary | ICD-10-CM

## 2016-09-01 DIAGNOSIS — M25621 Stiffness of right elbow, not elsewhere classified: Secondary | ICD-10-CM

## 2016-09-01 DIAGNOSIS — G8929 Other chronic pain: Secondary | ICD-10-CM

## 2016-09-02 NOTE — Therapy (Signed)
[facility] [address] [place], [place], [address] Phone: [phone]   Fax:  [phone]  Physical Therapy Treatment  Patient Details  Name: Kelly Massey MRN: [id number] Date of Birth: 10-26-1965 Referring Provider: [name] MD  Encounter Date: 09/01/2016    Past Medical History:  Diagnosis Date  . Allergy   . Anemia   . Arthritis    knees - no meds  . Asthma   . Colon polyps   . GERD (gastroesophageal reflux disease) 10/12/10   EGD, positive H. pylori  . HSV infection    History  . Hyperlipidemia    ? no meds - diet controlled  . Post-operative nausea and vomiting   . Seasonal allergies     Past Surgical History:  Procedure Laterality Date  . ABDOMINAL HYSTERECTOMY    . BALLOON DILATION N/A 07/24/2012   Procedure: BALLOON DILATION;  Surgeon: Inda Castle, MD;  Location: [facility];  Service: Endoscopy;  Laterality: N/A;  . BRAVO [place] STUDY N/A 07/24/2012   Procedure: BRAVO [place] STUDY;  Surgeon: Inda Castle, MD;  [facility];  Service: Endoscopy;  Laterality: N/A;  . DILATION AND CURETTAGE OF UTERUS     SAB  . ESOPHAGOGASTRODUODENOSCOPY N/A 07/24/2012   Procedure: ESOPHAGOGASTRODUODENOSCOPY (EGD);  Surgeon: Inda Castle, MD;  Location: [facility];  Service: Endoscopy;  Laterality: N/A;  . OVARIAN CYST REMOVAL  2004   laparotomy -left  . ROBOTIC ASSISTED LAPAROSCOPIC LYSIS OF ADHESION N/A 03/21/2014   Procedure: ROBOTIC ASSISTED LAPAROSCOPIC EXTENSIVE LYSIS OF ADHESION (1 Hour);  Surgeon: Marvene Staff, MD;  Location: [facility];  Service: Gynecology;  Laterality: N/A;  . ROBOTIC ASSISTED SALPINGO OOPHERECTOMY Left 03/21/2014   Procedure:  ROBOTIC ASSISTED LEFT OOPHORECTOMY;  Surgeon: Marvene Staff, MD;  Location: [facility];  Service: Gynecology;  Laterality: Left;  . SHOULDER ARTHROSCOPY Left   . TENNIS ELBOW RELEASE/NIRSCHEL PROCEDURE Right 05/01/2015   Procedure: RIGHT ELBOW  DEBRIDEMENT AND TENDON REPAIR;  Surgeon: [name], MD;  Location: [place];  Service: Orthopedics;  Laterality: Right;  . WISDOM TOOTH EXTRACTION      There were no vitals filed for this visit.                                    PT Long Term Goals - 04/09/16 1232      PT LONG TERM GOAL #1   Title Pt will be able to achieve full R elbow flexion/extension with <2/10 pain to promote return to PLOF   Baseline R elbow AROM WNL but slight pain.   Time 4   Period Weeks   Status Achieved     PT LONG TERM GOAL #2   Title Pt will improve self-perceived QuickDash score to <35% to promote increase in functional mobility so she can perform household tasks.   Baseline 04/08/16: 65.9% (slight improvement)   Time 4   Period Weeks   Status New     PT LONG TERM GOAL #3   Title Pt will increase R shoulder flexion/abduction AROM to >140, >161 respectively so that she can perform overhead lifting tasks    Time 4   Period Weeks   Status Not Met     PT LONG TERM GOAL #4   Title Pt will demonstrate equivalent R UE strength as compared to L UE strength to promote  return to PLOF so she can perform work tasks   Baseline R UE strength grossly 3/5 MMT (ROM limitations), LUE shoulder flexion 4+, abduction 4+, biceps/triceps 5   Time 4   Period Weeks   Status Not Met     PT LONG TERM GOAL #5   Title Pt will report <2/10 resting pain for R shoulder to promote pain free function so she can sleep through the night   Baseline pt with 5/10 resting R shoulder pain; increase R shoulder muscle soreness   Time 4   Period Weeks   Status Not Met             Patient will benefit from skilled therapeutic intervention in order to improve the following deficits and impairments:     Visit Diagnosis: Muscle weakness (generalized)  Stiffness of right shoulder, not elsewhere classified  Joint stiffness of elbow, right  Chronic right shoulder  pain     Problem List Patient Active Problem List   Diagnosis Date Noted  . Other neutropenia ([place]) 08/10/2016  . Vertigo 07/28/2016  . Health care maintenance 11/21/2014  . History of colonic polyps 11/21/2014  . Dysphagia 11/19/2014  . Trochanteric bursitis of left hip 10/08/2014  . URI (upper respiratory infection) 02/10/2014  . Pelvic pain in female 12/25/2013  . Personal history of ovarian cyst 12/25/2013  . Change in bowel movement 11/11/2013  . breast tenderness 09/29/2013  . Dysphagia, unspecified(787.20) 06/12/2012  . Barrett's esophagus 06/12/2012  . Environmental allergies 02/13/2012  . Enlarged thyroid 02/13/2012  . GERD (gastroesophageal reflux disease) 02/08/2012  . Leukopenia 02/08/2012  . Fatigue 02/08/2012  . Hypercholesterolemia 02/08/2012    Pura Spice 09/02/2016, 12:40 PM  [facility] [address] [place], [place], [address] Phone: [phone]   Fax:  [phone]  Name: Kelly Massey MRN: [id number] Date of Birth: Dec 17, 1965

## 2016-09-07 DIAGNOSIS — M19011 Primary osteoarthritis, right shoulder: Secondary | ICD-10-CM | POA: Diagnosis not present

## 2016-10-29 ENCOUNTER — Ambulatory Visit (INDEPENDENT_AMBULATORY_CARE_PROVIDER_SITE_OTHER): Payer: 59 | Admitting: Internal Medicine

## 2016-10-29 ENCOUNTER — Encounter: Payer: Self-pay | Admitting: Internal Medicine

## 2016-10-29 DIAGNOSIS — K2100 Gastro-esophageal reflux disease with esophagitis, without bleeding: Secondary | ICD-10-CM

## 2016-10-29 DIAGNOSIS — R0681 Apnea, not elsewhere classified: Secondary | ICD-10-CM | POA: Diagnosis not present

## 2016-10-29 DIAGNOSIS — E7800 Pure hypercholesterolemia, unspecified: Secondary | ICD-10-CM | POA: Diagnosis not present

## 2016-10-29 DIAGNOSIS — K2270 Barrett's esophagus without dysplasia: Secondary | ICD-10-CM | POA: Diagnosis not present

## 2016-10-29 DIAGNOSIS — D708 Other neutropenia: Secondary | ICD-10-CM | POA: Diagnosis not present

## 2016-10-29 DIAGNOSIS — Z9109 Other allergy status, other than to drugs and biological substances: Secondary | ICD-10-CM | POA: Diagnosis not present

## 2016-10-29 MED ORDER — DEXLANSOPRAZOLE 60 MG PO CPDR: 60.0000 mg | DELAYED_RELEASE_CAPSULE | Freq: Every day | ORAL | 2 refills | Status: DC

## 2016-10-29 NOTE — Progress Notes (Signed)
Pre-visit discussion using our clinic review tool. No additional management support is needed unless otherwise documented below in the visit note.  

## 2016-10-29 NOTE — Progress Notes (Signed)
Patient ID: Kelly Massey, female   DOB: Aug 20, 1965, 51 y.o.   MRN: [id number]   Subjective:    Patient ID: Kelly Massey, female    DOB: [birth year], 51 y.o.   MRN: [id number]  HPI  Patient here for a scheduled follow up.  She reports she is doing relatively well.  Does report some hoarseness now.  Sore throat.  Some runny nose.  No significant sinus pressure.  No fever.  Discussed the possible return of acid reflux.  She is off dexilant.  Only on zantac now.  Had decreased white blood cell count with PPI.  See previous notes and hematology evaluation.  No chest pain.  Breathing stable.  No abdominal pain. Bowels moving.  Does report some increased fatigue.  Increased daytime somnolence.  Husband has witnessed possible apneic episodes.  Discussed sleep study.     Past Medical History:  Diagnosis Date  . Allergy   . Anemia   . Arthritis    knees - no meds  . Asthma   . Colon polyps   . GERD (gastroesophageal reflux disease) 10/12/10   EGD, positive H. pylori  . HSV infection    History  . Hyperlipidemia    ? no meds - diet controlled  . Post-operative nausea and vomiting   . Seasonal allergies    Past Surgical History:  Procedure Laterality Date  . ABDOMINAL HYSTERECTOMY    . BALLOON DILATION N/A 07/24/2012   Procedure: BALLOON DILATION;  Surgeon: Inda Castle, MD;  Location: [facility];  Service: Endoscopy;  Laterality: N/A;  . BRAVO [place] STUDY N/A 07/24/2012   Procedure: BRAVO [place] STUDY;  Surgeon: Inda Castle, MD;  Location: [facility];  Service: Endoscopy;  Laterality: N/A;  . DILATION AND CURETTAGE OF UTERUS     SAB  . ESOPHAGOGASTRODUODENOSCOPY N/A 07/24/2012   Procedure: ESOPHAGOGASTRODUODENOSCOPY (EGD);  Surgeon: Inda Castle, MD;  Location: [facility];  Service: Endoscopy;  Laterality: N/A;  . OVARIAN CYST REMOVAL  2004   laparotomy -left  . ROBOTIC ASSISTED LAPAROSCOPIC LYSIS OF ADHESION N/A 03/21/2014   Procedure: ROBOTIC ASSISTED LAPAROSCOPIC EXTENSIVE  LYSIS OF ADHESION (1 Hour);  Surgeon: Marvene Staff, MD;  Location: [facility];  Service: Gynecology;  Laterality: N/A;  . ROBOTIC ASSISTED SALPINGO OOPHERECTOMY Left 03/21/2014   Procedure:  ROBOTIC ASSISTED LEFT OOPHORECTOMY;  Surgeon: Marvene Staff, MD;  Location: [facility];  Service: Gynecology;  Laterality: Left;  . SHOULDER ARTHROSCOPY Left   . TENNIS ELBOW RELEASE/NIRSCHEL PROCEDURE Right 05/01/2015   Procedure: RIGHT ELBOW DEBRIDEMENT AND TENDON REPAIR;  Surgeon: [name], MD;  Location: [facility];  Service: Orthopedics;  Laterality: Right;  . WISDOM TOOTH EXTRACTION     Family History  Problem Relation Age of Onset  . Arthritis Mother   . Stroke Mother   . Hypertension Mother   . Heart failure Mother   . Dementia Mother   . Breast cancer Maternal Aunt   . Prostate cancer Maternal Uncle        x 2  . Diabetes Sister   . Diabetes Brother        x 2  . Diabetes Paternal Grandmother   . Hypertension Other   . Hyperlipidemia Other   . Diabetes Paternal Grandfather   . Lung cancer Father   . Diabetes Maternal Uncle   . Throat cancer Maternal Aunt        Smoker  . Colon cancer Neg Hx  Social History   Social History  . Marital status: Married    Spouse name: N/A  . Number of children: 0  . 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 Smoking status: Never Smoker  . Smokeless tobacco: Never Used  . Alcohol use No  . Drug use: No  . Sexual activity: Yes    Birth control/ protection: None     Comment: hysterectomy   Other Topics Concern  . None   Social History Narrative  . None    Outpatient Encounter Prescriptions as of 10/29/2016  Medication Sig  . acyclovir (ZOVIRAX) 400 MG tablet Take 1 tablet by mouth 2 (two) times daily as needed.  . Calcium Carbonate-Vitamin D (CALCIUM 600+D3) 600-400 MG-UNIT per tablet Take 1 tablet by mouth daily.  . ferrous sulfate 325  (65 FE) MG tablet Take 325 mg by mouth daily.   . mometasone (NASONEX) 50 MCG/ACT nasal spray PLACE 2 SPRAYS INTO THE NOSE DAILY.  . Multiple Vitamins-Minerals (MULTIVITAMIN WITH MINERALS) tablet Take 1 tablet by mouth daily.  . ranitidine (ZANTAC) 150 MG tablet Take 300 mg by mouth daily as needed for heartburn.   . vitamin E 400 UNIT capsule Take 400 Units by mouth daily. Reported on 10/09/2015  . dexlansoprazole (DEXILANT) 60 MG capsule Take 1 capsule (60 mg total) by mouth daily.   No facility-administered encounter medications on file as of 10/29/2016.     Review of Systems  Constitutional: Positive for fatigue. Negative for appetite change and unexpected weight change.  HENT: Negative for sinus pressure.        Runny nose.  Hoarseness.   Respiratory: Negative for cough, chest tightness and shortness of breath.   Cardiovascular: Negative for chest pain, palpitations and leg swelling.  Gastrointestinal: Negative for abdominal pain, diarrhea, nausea and vomiting.  Genitourinary: Negative for difficulty urinating and dysuria.  Musculoskeletal: Negative for back pain and joint swelling.  Skin: Negative for color change and rash.  Neurological: Negative for dizziness, light-headedness and headaches.  Psychiatric/Behavioral: Negative for agitation and dysphoric mood.       Objective:    Physical Exam  Constitutional: She appears well-developed and well-nourished. No distress.  HENT:  Nose: Nose normal.  Mouth/Throat: Oropharynx is clear and moist.  Neck: Neck supple. No thyromegaly present.  Cardiovascular: Normal rate and regular rhythm.   Pulmonary/Chest: Breath sounds normal. No respiratory distress. She has no wheezes.  Abdominal: Soft. Bowel sounds are normal. There is no tenderness.  Musculoskeletal: She exhibits no edema or tenderness.  Lymphadenopathy:    She has no cervical adenopathy.  Skin: No rash noted. No erythema.  Psychiatric: She has a normal mood and affect. Her  behavior is normal.    BP 110/78 (BP Location: Left Arm, Patient Position: Sitting, Cuff Size: Normal)   Pulse 100   Temp 98.6 F (37 C) (Oral)   Resp 14   Ht 5\' 6"  (1.676 m)   Wt 200 lb (90.7 kg)   LMP 10/06/2011   SpO2 95%   BMI 32.28 kg/m  Wt Readings from Last 3 Encounters:  10/29/16 200 lb (90.7 kg)  08/16/16 196 lb (88.9 kg)  08/10/16 198 lb (89.8 kg)     Lab Results  Component Value Date   WBC 3.4 (L) 08/10/2016   HGB 13.6 08/10/2016   HCT 41.2 08/10/2016   PLT 234 08/10/2016   GLUCOSE 90 07/28/2016   CHOL 200 08/06/2015  TRIG 93.0 08/06/2015   HDL 67.70 08/06/2015   LDLCALC 114 (H) 08/06/2015   ALT 10 07/28/2016   AST 14 07/28/2016   NA 136 07/28/2016   K 4.6 07/28/2016   CL 102 07/28/2016   CREATININE 0.72 07/28/2016   BUN 7 07/28/2016   CO2 29 07/28/2016   TSH 0.76 07/28/2016    Mm Screening Breast Tomo Bilateral  Result Date: 08/19/2016 CLINICAL DATA:  Screening. EXAM: 2D DIGITAL SCREENING BILATERAL MAMMOGRAM WITH CAD AND ADJUNCT TOMO COMPARISON:  Previous exam(s). ACR Breast Density Category c: The breast tissue is heterogeneously dense, which may obscure small masses. FINDINGS: There are no findings suspicious for malignancy. Images were processed with CAD. IMPRESSION: No mammographic evidence of malignancy. A result letter of this screening mammogram will be mailed directly to the patient. RECOMMENDATION: Screening mammogram in one year. (Code:SM-B-01Y) BI-RADS CATEGORY  1: Negative. Electronically Signed   By: Fidela Salisbury M.D.   On: 08/19/2016 17:06       Assessment & Plan:   Problem List Items Addressed This Visit    Barrett's esophagus    Followed by GI.       Environmental allergies    Continue nasal sprays, etc as outlined.  Follow.       GERD (gastroesophageal reflux disease)    Persistent hoarseness as outlined.  Restart PPI.  Follow.  Get her back in soon to reassess.        Relevant Medications   dexlansoprazole  (DEXILANT) 60 MG capsule   Hypercholesterolemia    Low cholesterol diet and exercise.  Follow lipid panel.        Other neutropenia (HCC)    History of mild leukopenia and intermittent neutropenia.  There was a question if related to dexilant.  Has been off and on zantac now.  With increased hoarseness, etc - need to restart PPI.  Follow cbc.       Witnessed apneic spells    Increased daytime somnolence, fatigue and witnessed apneic episodes.  Discussed with her today.  Schedule split night sleep study.      Relevant Orders   Ambulatory referral to Sleep Studies       Einar Pheasant, MD

## 2016-10-31 ENCOUNTER — Encounter: Payer: Self-pay | Admitting: Internal Medicine

## 2016-10-31 DIAGNOSIS — R0681 Apnea, not elsewhere classified: Secondary | ICD-10-CM | POA: Insufficient documentation

## 2016-10-31 NOTE — Assessment & Plan Note (Signed)
History of mild leukopenia and intermittent neutropenia.  There was a question if related to dexilant.  Has been off and on zantac now.  With increased hoarseness, etc - need to restart PPI.  Follow cbc.

## 2016-10-31 NOTE — Assessment & Plan Note (Signed)
Low cholesterol diet and exercise.  Follow lipid panel.   

## 2016-10-31 NOTE — Assessment & Plan Note (Signed)
Persistent hoarseness as outlined.  Restart PPI.  Follow.  Get her back in soon to reassess.

## 2016-10-31 NOTE — Assessment & Plan Note (Signed)
Followed by GI

## 2016-10-31 NOTE — Assessment & Plan Note (Signed)
Continue nasal sprays, etc as outlined.  Follow.

## 2016-10-31 NOTE — Assessment & Plan Note (Signed)
Increased daytime somnolence, fatigue and witnessed apneic episodes.  Discussed with her today.  Schedule split night sleep study.

## 2016-11-04 ENCOUNTER — Telehealth: Payer: Self-pay | Admitting: Internal Medicine

## 2016-11-04 NOTE — Telephone Encounter (Signed)
Called patient she will fax documentation from work has out fax number. Will call if any questions.

## 2016-11-04 NOTE — Telephone Encounter (Signed)
Pt called back with the date of her last tetanus shot, it was 03/14/09.

## 2016-12-29 ENCOUNTER — Ambulatory Visit: Payer: 59 | Attending: Neurology

## 2016-12-29 DIAGNOSIS — G4733 Obstructive sleep apnea (adult) (pediatric): Secondary | ICD-10-CM | POA: Insufficient documentation

## 2016-12-29 DIAGNOSIS — R0683 Snoring: Secondary | ICD-10-CM | POA: Diagnosis not present

## 2016-12-29 DIAGNOSIS — F5101 Primary insomnia: Secondary | ICD-10-CM | POA: Diagnosis not present

## 2016-12-29 DIAGNOSIS — G4730 Sleep apnea, unspecified: Secondary | ICD-10-CM | POA: Diagnosis not present

## 2016-12-31 ENCOUNTER — Ambulatory Visit: Payer: 59 | Admitting: Internal Medicine

## 2016-12-31 DIAGNOSIS — Z0289 Encounter for other administrative examinations: Secondary | ICD-10-CM

## 2017-01-14 ENCOUNTER — Telehealth: Payer: Self-pay

## 2017-01-14 DIAGNOSIS — G479 Sleep disorder, unspecified: Secondary | ICD-10-CM

## 2017-01-14 DIAGNOSIS — Z01419 Encounter for gynecological examination (general) (routine) without abnormal findings: Secondary | ICD-10-CM | POA: Diagnosis not present

## 2017-01-14 NOTE — Telephone Encounter (Signed)
Order placed for neurology referral.  Copy of sleep study to [facility].

## 2017-01-14 NOTE — Telephone Encounter (Signed)
Called patient gave all results on sleep study. She is ok with referral to [facility] for specialist. She also would like to have referral sent for colonoscopy.   Sleep study sent to scan.

## 2017-02-15 ENCOUNTER — Other Ambulatory Visit: Payer: 59

## 2017-02-15 ENCOUNTER — Ambulatory Visit: Payer: 59 | Admitting: Internal Medicine

## 2017-02-22 ENCOUNTER — Ambulatory Visit: Payer: 59 | Admitting: Internal Medicine

## 2017-02-22 ENCOUNTER — Inpatient Hospital Stay: Payer: 59 | Attending: Internal Medicine | Admitting: Internal Medicine

## 2017-02-22 ENCOUNTER — Other Ambulatory Visit: Payer: Self-pay

## 2017-02-22 ENCOUNTER — Inpatient Hospital Stay: Payer: 59

## 2017-02-22 ENCOUNTER — Encounter: Payer: Self-pay | Admitting: Internal Medicine

## 2017-02-22 ENCOUNTER — Other Ambulatory Visit: Payer: 59

## 2017-02-22 VITALS — BP 130/88 | HR 76 | Temp 98.9°F | Resp 20 | Ht 66.0 in | Wt 201.0 lb

## 2017-02-22 DIAGNOSIS — Z86010 Personal history of colonic polyps: Secondary | ICD-10-CM | POA: Insufficient documentation

## 2017-02-22 DIAGNOSIS — Z79899 Other long term (current) drug therapy: Secondary | ICD-10-CM | POA: Diagnosis not present

## 2017-02-22 DIAGNOSIS — Z803 Family history of malignant neoplasm of breast: Secondary | ICD-10-CM

## 2017-02-22 DIAGNOSIS — K219 Gastro-esophageal reflux disease without esophagitis: Secondary | ICD-10-CM | POA: Diagnosis not present

## 2017-02-22 DIAGNOSIS — Z880 Allergy status to penicillin: Secondary | ICD-10-CM | POA: Insufficient documentation

## 2017-02-22 DIAGNOSIS — Z886 Allergy status to analgesic agent status: Secondary | ICD-10-CM | POA: Diagnosis not present

## 2017-02-22 DIAGNOSIS — M1990 Unspecified osteoarthritis, unspecified site: Secondary | ICD-10-CM | POA: Diagnosis not present

## 2017-02-22 DIAGNOSIS — D708 Other neutropenia: Secondary | ICD-10-CM

## 2017-02-22 DIAGNOSIS — Z8042 Family history of malignant neoplasm of prostate: Secondary | ICD-10-CM | POA: Diagnosis not present

## 2017-02-22 DIAGNOSIS — E785 Hyperlipidemia, unspecified: Secondary | ICD-10-CM | POA: Diagnosis not present

## 2017-02-22 DIAGNOSIS — R785 Finding of other psychotropic drug in blood: Secondary | ICD-10-CM

## 2017-02-22 LAB — CBC WITH DIFFERENTIAL/PLATELET
Basophils Absolute: 0 10*3/uL (ref 0–0.1)
Basophils Relative: 1 %
Eosinophils Absolute: 0 10*3/uL (ref 0–0.7)
Eosinophils Relative: 1 %
HCT: 41.2 % (ref 35.0–47.0)
Hemoglobin: 13.9 g/dL (ref 12.0–16.0)
Lymphocytes Relative: 39 %
Lymphs Abs: 1.5 10*3/uL (ref 1.0–3.6)
MCH: 30.7 pg (ref 26.0–34.0)
MCHC: 33.8 g/dL (ref 32.0–36.0)
MCV: 90.8 fL (ref 80.0–100.0)
Monocytes Absolute: 0.4 10*3/uL (ref 0.2–0.9)
Monocytes Relative: 11 %
Neutro Abs: 1.8 10*3/uL (ref 1.4–6.5)
Neutrophils Relative %: 48 %
Platelets: 213 10*3/uL (ref 150–440)
RBC: 4.53 MIL/uL (ref 3.80–5.20)
RDW: 14.2 % (ref 11.5–14.5)
WBC: 3.7 10*3/uL (ref 3.6–11.0)

## 2017-02-22 NOTE — Assessment & Plan Note (Addendum)
Mild leukopenia -intermittent neutropenia nadir 800 ANC. Question related to Dexilant- as her counts improve when she is off the medication. Patient is currently on Zantac.   # Today her white count is 3.7 with ANC of 1.8.  Clinically asymptomatic.  I discussed that bone marrow biopsy will not be very revealing.  No further workup is recommended at this time.  #Recommend follow-up as needed.  She was asked to call us if she has any new symptoms/or her counts drop further.  She agrees to follow-up with PCP  Cc; Dr.scott.

## 2017-02-22 NOTE — Progress Notes (Signed)
[facility] CONSULT NOTE  Patient Care Team: Einar Pheasant, MD as PCP - General (Unknown Physician Specialty)  CHIEF COMPLAINTS/PURPOSE OF CONSULTATION:  Leucopenia  HISTORY OF PRESENTING ILLNESS:  Kelly Massey 51 y.o.  female with prior history of intermittent leukopenia/neutropenia [? Related to dexilant] is here for follow-up.  Patient took herself off [place];  she is currently on Zantac. She feels her reflux is well-controlled on Zantac at this time.  Patient denies any frequent infections; denies any frequent use of antibiotics. Denies any new medications. Denies any alcohol.   ROS: A complete 10 point review of system is done which is negative except mentioned above in history of present illness  MEDICAL HISTORY:  Past Medical History:  Diagnosis Date  . Allergy   . Anemia   . Arthritis    knees - no meds  . Asthma   . Colon polyps   . GERD (gastroesophageal reflux disease) 10/12/10   EGD, positive H. pylori  . HSV infection    History  . Hyperlipidemia    ? no meds - diet controlled  . Post-operative nausea and vomiting   . Seasonal allergies     SURGICAL HISTORY: Past Surgical History:  Procedure Laterality Date  . ABDOMINAL HYSTERECTOMY    . BALLOON DILATION N/A 07/24/2012   Performed by Inda Castle, MD at [facility]  . BILATERAL SALPINGECTOMY Bilateral 10/21/2011   Performed by Marvene Staff, MD at [facility]  . BRAVO [place] STUDY N/A 07/24/2012   Performed by Inda Castle, MD at [place]  . DILATION AND CURETTAGE OF UTERUS     SAB  . ESOPHAGOGASTRODUODENOSCOPY (EGD) N/A 07/24/2012   Performed by Inda Castle, MD at [place]  . OVARIAN CYST REMOVAL  2004   laparotomy -left  . Possible ROBOTIC ASSISTED Left OOPHORECTOMY Left 02/07/2014   Performed by Marvene Staff, MD at [facility]  . RIGHT ELBOW DEBRIDEMENT AND TENDON REPAIR Right 05/01/2015   Performed by [name], MD at [facility]  .  ROBOTIC ASSISTED LAPAROSCOPIC EXTENSIVE LYSIS OF ADHESION (1 Hour) N/A 03/21/2014   Performed by Marvene Staff, MD at [facility]  . ROBOTIC ASSISTED LAPAROSCOPIC Left OVARIAN CYSTECTOMY Left 02/07/2014   Performed by Marvene Staff, MD at [facility]  . ROBOTIC ASSISTED LEFT OOPHORECTOMY Left 03/21/2014   Performed by Marvene Staff, MD at [facility]  . ROBOTIC ASSISTED TOTAL HYSTERECTOMY N/A 10/21/2011   Performed by Marvene Staff, MD at [facility]  . SHOULDER ARTHROSCOPY Left   . WISDOM TOOTH EXTRACTION      SOCIAL HISTORY: Social History   Socioeconomic History  . Marital status: Married    Spouse name: Not on file  . Number of children: 0  . Years of education: Not on file  . Highest education level: Not on file  Social Needs  . Financial resource strain: Not on file  . Food insecurity - worry: Not on file  . Food insecurity - inability: Not on file  . Transportation needs - medical: Not on file  . Transportation needs - non-medical: Not on file  Occupational History  . Occupation: RESEARCH    Employer: [employer]  Tobacco Use  . Smoking status: Never Smoker  . Smokeless tobacco: Never Used  Substance and Sexual Activity  . Alcohol use: No    Alcohol/week: 0.0 oz  . Drug use: No  . Sexual activity: Yes  Birth control/protection: None    Comment: hysterectomy  Other Topics Concern  . Not on file  Social History Narrative  . Not on file    FAMILY HISTORY: Family History  Problem Relation Age of Onset  . Arthritis Mother   . Stroke Mother   . Hypertension Mother   . Heart failure Mother   . Dementia Mother   . Breast cancer Maternal Aunt   . Prostate cancer Maternal Uncle        x 2  . Diabetes Sister   . Diabetes Brother        x 2  . Diabetes Paternal Grandmother   . Hypertension Other   . Hyperlipidemia Other   . Diabetes Paternal Grandfather   . Lung cancer Father   . Diabetes Maternal Uncle   . Throat cancer Maternal Aunt         Smoker  . Colon cancer Neg Hx     ALLERGIES:  is allergic to adhesive [tape]; aspirin; ibuprofen; penicillins; nsaids; oxycodone; and shrimp [shellfish allergy].  MEDICATIONS:  Current Outpatient Medications  Medication Sig Dispense Refill  . acyclovir (ZOVIRAX) 400 MG tablet Take 1 tablet by mouth 2 (two) times daily as needed.    . Calcium Carbonate-Vitamin D (CALCIUM 600+D3) 600-400 MG-UNIT per tablet Take 1 tablet by mouth daily.    . ferrous sulfate 325 (65 FE) MG tablet Take 325 mg by mouth daily.     . mometasone (NASONEX) 50 MCG/ACT nasal spray PLACE 2 SPRAYS INTO THE NOSE DAILY. 51 g 3  . Multiple Vitamins-Minerals (MULTIVITAMIN WITH MINERALS) tablet Take 1 tablet by mouth daily.    . ranitidine (ZANTAC) 150 MG tablet Take 300 mg by mouth daily as needed for heartburn.     . vitamin E 400 UNIT capsule Take 400 Units by mouth daily. Reported on 10/09/2015     No current facility-administered medications for this visit.       Marland Kitchen  PHYSICAL EXAMINATION:   Vitals:   02/22/17 0839  BP: 130/88  Pulse: 76  Resp: 20  Temp: 98.9 F (37.2 C)   Filed Weights   02/22/17 0839  Weight: 201 lb (91.2 kg)    GENERAL: Well-nourished well-developed; Alert, no distress and comfortable.  Alone.  EYES: no pallor or icterus OROPHARYNX: no thrush or ulceration; good dentition  NECK: supple, no masses felt LYMPH:  no palpable lymphadenopathy in the cervical, axillary or inguinal regions LUNGS: clear to auscultation and  No wheeze or crackles HEART/CVS: regular rate & rhythm and no murmurs; No lower extremity edema ABDOMEN: abdomen soft, non-tender and normal bowel sounds Musculoskeletal:no cyanosis of digits and no clubbing  PSYCH: alert & oriented x 3 with fluent speech NEURO: no focal motor/sensory deficits SKIN:  no rashes or significant lesions  LABORATORY DATA:  I have reviewed the data as listed Lab Results  Component Value Date   WBC 3.7 02/22/2017   HGB 13.9  02/22/2017   HCT 41.2 02/22/2017   MCV 90.8 02/22/2017   PLT 213 02/22/2017   Recent Labs    07/28/16 1029  NA 136  K 4.6  CL 102  CO2 29  GLUCOSE 90  BUN 7  CREATININE 0.72  CALCIUM 9.5  PROT 7.0  ALBUMIN 4.3  AST 14  ALT 10  ALKPHOS 44  BILITOT 0.4    RADIOGRAPHIC STUDIES: I have personally reviewed the radiological images as listed and agreed with the findings in the report. No results found.  ASSESSMENT &  PLAN:  Other neutropenia (HCC) Mild leukopenia -intermittent neutropenia nadir 800 ANC. Question related to Dexilant- as her counts improve when she is off the medication. Patient is currently on Zantac.   # Today her white count is 3.7 with ANC of 1.8.  Clinically asymptomatic.  I discussed that bone marrow biopsy will not be very revealing.  No further workup is recommended at this time.  #Recommend follow-up as needed.  She was asked to call us if she has any new symptoms/or her counts drop further.  She agrees to follow-up with PCP  Cc; Dr.scott.          Cammie Sickle, MD 02/22/2017 8:59 AM

## 2017-03-01 ENCOUNTER — Other Ambulatory Visit: Payer: 59

## 2017-03-01 ENCOUNTER — Ambulatory Visit: Payer: 59 | Admitting: Internal Medicine

## 2017-03-11 ENCOUNTER — Other Ambulatory Visit: Payer: Self-pay | Admitting: Internal Medicine

## 2017-07-13 ENCOUNTER — Other Ambulatory Visit: Payer: Self-pay | Admitting: Internal Medicine

## 2017-07-13 DIAGNOSIS — Z1231 Encounter for screening mammogram for malignant neoplasm of breast: Secondary | ICD-10-CM

## 2017-07-19 ENCOUNTER — Telehealth: Payer: Self-pay

## 2017-07-19 NOTE — Telephone Encounter (Signed)
Copied from [place] [phone]. Topic: Appointment Scheduling - Scheduling Inquiry for Clinic >> Jul 19, 2017  9:43 AM Percell Belt A wrote: Reason for CRM: pt called in and stated that she would like to see Dr Nicki Reaper.  She has to see her due to her ins.  Other wise it is out of her network.  She is having possible vertigo.  Please contact her for an appt -  [phone]

## 2017-07-19 NOTE — Telephone Encounter (Signed)
Lm for pt to call back and schedule.

## 2017-07-27 ENCOUNTER — Ambulatory Visit: Payer: 59 | Admitting: Internal Medicine

## 2017-07-27 ENCOUNTER — Encounter: Payer: Self-pay | Admitting: Internal Medicine

## 2017-07-27 ENCOUNTER — Other Ambulatory Visit: Payer: Self-pay | Admitting: Internal Medicine

## 2017-07-27 VITALS — BP 108/82 | HR 81 | Temp 98.2°F | Ht 66.0 in | Wt 200.4 lb

## 2017-07-27 DIAGNOSIS — R51 Headache: Secondary | ICD-10-CM | POA: Diagnosis not present

## 2017-07-27 DIAGNOSIS — R42 Dizziness and giddiness: Secondary | ICD-10-CM

## 2017-07-27 DIAGNOSIS — J0100 Acute maxillary sinusitis, unspecified: Secondary | ICD-10-CM

## 2017-07-27 DIAGNOSIS — R519 Headache, unspecified: Secondary | ICD-10-CM

## 2017-07-27 DIAGNOSIS — N6019 Diffuse cystic mastopathy of unspecified breast: Secondary | ICD-10-CM

## 2017-07-27 DIAGNOSIS — Z1239 Encounter for other screening for malignant neoplasm of breast: Secondary | ICD-10-CM

## 2017-07-27 MED ORDER — AZITHROMYCIN 250 MG PO TABS: ORAL_TABLET | ORAL | 0 refills | Status: DC

## 2017-07-27 MED ORDER — MECLIZINE HCL 12.5 MG PO TABS: 12.5000 mg | ORAL_TABLET | Freq: Two times a day (BID) | ORAL | 1 refills | Status: DC | PRN

## 2017-07-27 NOTE — Progress Notes (Signed)
Order placed for 3D mammogram.

## 2017-07-27 NOTE — Progress Notes (Signed)
Chief Complaint  Patient presents with  . Dizziness   F/u  1. C/o dizziness and h/a h/a frontal 1-2/10 at times in occipital and intermittent tried Tylenol last night and helped. C/o dizziness lasting x 1 min room is spinning This is random she tries to close her eyes to help. No HTN issues, she had eyes checked 06/2017 and lasik 1 year ago, orthostatics neg today lying 126/84 hr 86 sitting 118/80 hr 77, standing 120/84 HR 84, no h/o migraines, denies stress. She does have h/o allergies and trying elderberry syrup, denies stress.   Review of Systems  Constitutional: Negative for weight loss.  HENT:       +increased wax in ears   Neurological: Positive for dizziness and headaches.   Past Medical History:  Diagnosis Date  . Allergy   . Anemia   . Arthritis    knees - no meds  . Asthma   . Colon polyps   . GERD (gastroesophageal reflux disease) 10/12/10   EGD, positive H. pylori  . HSV infection    History  . Hyperlipidemia    ? no meds - diet controlled  . Post-operative nausea and vomiting   . Seasonal allergies    Past Surgical History:  Procedure Laterality Date  . ABDOMINAL HYSTERECTOMY    . BALLOON DILATION N/A 07/24/2012   Procedure: BALLOON DILATION;  Surgeon: Inda Castle, MD;  Location: [facility];  Service: Endoscopy;  Laterality: N/A;  . BRAVO [place] STUDY N/A 07/24/2012   Procedure: BRAVO [place] STUDY;  Surgeon: Inda Castle, MD;  Location: [facility];  Service: Endoscopy;  Laterality: N/A;  . DILATION AND CURETTAGE OF UTERUS     SAB  . ESOPHAGOGASTRODUODENOSCOPY N/A 07/24/2012   Procedure: ESOPHAGOGASTRODUODENOSCOPY (EGD);  Surgeon: Inda Castle, MD;  Location: [facility];  Service: Endoscopy;  Laterality: N/A;  . OVARIAN CYST REMOVAL  2004   laparotomy -left  . ROBOTIC ASSISTED LAPAROSCOPIC LYSIS OF ADHESION N/A 03/21/2014   Procedure: ROBOTIC ASSISTED LAPAROSCOPIC EXTENSIVE LYSIS OF ADHESION (1 Hour);  Surgeon: Marvene Staff, MD;  Location: [facility];   Service: Gynecology;  Laterality: N/A;  . ROBOTIC ASSISTED SALPINGO OOPHERECTOMY Left 03/21/2014   Procedure:  ROBOTIC ASSISTED LEFT OOPHORECTOMY;  Surgeon: Marvene Staff, MD;  Location: [facility];  Service: Gynecology;  Laterality: Left;  . SHOULDER ARTHROSCOPY Left   . TENNIS ELBOW RELEASE/NIRSCHEL PROCEDURE Right 05/01/2015   Procedure: RIGHT ELBOW DEBRIDEMENT AND TENDON REPAIR;  Surgeon: [name], MD;  Location: [facility];  Service: Orthopedics;  Laterality: Right;  . WISDOM TOOTH EXTRACTION     Family History  Problem Relation Age of Onset  . Arthritis Mother   . Stroke Mother   . Hypertension Mother   . Heart failure Mother   . Dementia Mother   . Breast cancer Maternal Aunt   . Prostate cancer Maternal Uncle        x 2  . Diabetes Sister   . Diabetes Brother        x 2  . Diabetes Paternal Grandmother   . Hypertension Other   . Hyperlipidemia Other   . Diabetes Paternal Grandfather   . Lung cancer Father   . Diabetes Maternal Uncle   . Throat cancer Maternal Aunt        Smoker  . Colon cancer Neg Hx    Social History   Socioeconomic History  . Marital status: Married    Spouse name: Not on file  .  Number of children: 0  . Years of education: Not on file  . Highest education level: Not on file  Occupational History  . Occupation: RESEARCH    Employer: [employer]  . Financial resource strain: Not on file  . Food insecurity:    Worry: Not on file    Inability: Not on file  . Transportation needs:    Medical: Not on file    Non-medical: Not on file  Tobacco Use  . Smoking status: Never Smoker  . Smokeless tobacco: Never Used  Substance and Sexual Activity  . Alcohol use: No    Alcohol/week: 0.0 oz  . Drug use: No  . Sexual activity: Yes    Birth control/protection: None    Comment: hysterectomy  Lifestyle  . Physical activity:    Days per week: Not on file    Minutes per session: Not on file  . Stress:  Not on file  Relationships  . Social connections:    Talks on phone: Not on file    Gets together: Not on file    Attends religious service: Not on file    Active member of club or organization: Not on file    Attends meetings of clubs or organizations: Not on file    Relationship status: Not on file  . Intimate partner violence:    Fear of current or ex partner: Not on file    Emotionally abused: Not on file    Physically abused: Not on file    Forced sexual activity: Not on file  Other Topics Concern  . Not on file  Social History Narrative  . Not on file   Current Meds  Medication Sig  . acyclovir (ZOVIRAX) 400 MG tablet TAKE 1 TABLET (400 MG TOTAL) BY MOUTH 2 (TWO) TIMES DAILY AS NEEDED (FOR OUTBREAKS).  . Calcium Carbonate-Vitamin D (CALCIUM 600+D3) 600-400 MG-UNIT per tablet Take 1 tablet by mouth daily.  . ferrous sulfate 325 (65 FE) MG tablet Take 325 mg by mouth daily.   . mometasone (NASONEX) 50 MCG/ACT nasal spray PLACE 2 SPRAYS INTO THE NOSE DAILY.  . Multiple Vitamins-Minerals (MULTIVITAMIN WITH MINERALS) tablet Take 1 tablet by mouth daily.  . ranitidine (ZANTAC) 150 MG tablet Take 300 mg by mouth daily as needed for heartburn.   . vitamin E 400 UNIT capsule Take 400 Units by mouth daily. Reported on 10/09/2015  . [DISCONTINUED] acyclovir (ZOVIRAX) 400 MG tablet Take 1 tablet by mouth 2 (two) times daily as needed.   Allergies  Allergen Reactions  . Adhesive [Tape] Other (See Comments)    Skin appeared "burned" after last surg.  . Aspirin Nausea And Vomiting  . Ibuprofen Nausea And Vomiting  . Penicillins Diarrhea  . Nsaids Other (See Comments)    GI pain  . Oxycodone Other (See Comments)    Severe abdominal cramps  . Shrimp [Shellfish Allergy] Nausea And Vomiting   No results found for this or any previous visit (from the past 2160 hour(s)). Objective  Body mass index is 32.35 kg/m. Wt Readings from Last 3 Encounters:  07/27/17 200 lb 6.4 oz (90.9 kg)   02/22/17 201 lb (91.2 kg)  10/29/16 200 lb (90.7 kg)   Temp Readings from Last 3 Encounters:  07/27/17 98.2 F (36.8 C) (Oral)  02/22/17 98.9 F (37.2 C) (Tympanic)  10/29/16 98.6 F (37 C) (Oral)   BP Readings from Last 3 Encounters:  07/27/17 108/82  02/22/17 130/88  10/29/16 110/78  Pulse Readings from Last 3 Encounters:  07/27/17 81  02/22/17 76  10/29/16 100    Physical Exam  Constitutional: She is oriented to person, place, and time. She appears well-developed and well-nourished. She is cooperative.  HENT:  Head: Normocephalic and atraumatic.  Nose: Right sinus exhibits maxillary sinus tenderness. Left sinus exhibits maxillary sinus tenderness.  Mouth/Throat: Oropharynx is clear and moist and mucous membranes are normal.  Increased cerumen in ears R>L  Eyes: Pupils are equal, round, and reactive to light. Conjunctivae are normal.  Cardiovascular: Normal rate, regular rhythm and normal heart sounds.  Pulmonary/Chest: Effort normal.  Neurological: She is alert and oriented to person, place, and time. She has normal strength. No sensory deficit. Gait normal.  CN 2-12 grossly intact Normal motor strength upper and lower ext b/l   Skin: Skin is warm, dry and intact.  Psychiatric: She has a normal mood and affect. Her speech is normal and behavior is normal. Judgment and thought content normal.  Nursing note and vitals reviewed.   Assessment   1. Vertigo. Orthostatics neg today. She does have wax in ears R>L 2. H/a ddx related to #1 vs sinus issues/sinusitis. HTN controlled  3. HM Plan  1. Trial of meclizine  Refer to neurology, vestibular PT Reviewed MRI 08/11/16 neg except for pineal cyst  Trial of debrox OTC  2. Trial of zpack  Prn Tylenol helping 3. Sent message to PCP to addend order for mammo upcoming 08/2017 she has fibrocystic breast dz to get covered 3d mammo  Provider: Dr. Olivia Mackie McLean-[facility]oc[facility]

## 2017-07-27 NOTE — Progress Notes (Signed)
Pre visit review using our clinic review tool, if applicable. No additional management support is needed unless otherwise documented below in the visit note. 

## 2017-07-27 NOTE — Patient Instructions (Addendum)
F/u in 3 months with D.r Nicki Reaper sooner if needed   General Headache Without Cause A headache is pain or discomfort felt around the head or neck area. There are many causes and types of headaches. In some cases, the cause may not be found. Follow these instructions at home: Managing pain  Take over-the-counter and prescription medicines only as told by your doctor.  Lie down in a dark, quiet room when you have a headache.  If directed, apply ice to the head and neck area: ? Put ice in a plastic bag. ? Place a towel between your skin and the bag. ? Leave the ice on for 20 minutes, 2-3 times per day.  Use a heating pad or hot shower to apply heat to the head and neck area as told by your doctor.  Keep lights dim if bright lights bother you or make your headaches worse. Eating and drinking  Eat meals on a regular schedule.  Lessen how much alcohol you drink.  Lessen how much caffeine you drink, or stop drinking caffeine. General instructions  Keep all follow-up visits as told by your doctor. This is important.  Keep a journal to find out if certain things bring on headaches. For example, write down: ? What you eat and drink. ? How much sleep you get. ? Any change to your diet or medicines.  Relax by getting a massage or doing other relaxing activities.  Lessen stress.  Sit up straight. Do not tighten (tense) your muscles.  Do not use tobacco products. This includes cigarettes, chewing tobacco, or e-cigarettes. If you need help quitting, ask your doctor.  Exercise regularly as told by your doctor.  Get enough sleep. This often means 7-9 hours of sleep. Contact a doctor if:  Your symptoms are not helped by medicine.  You have a headache that feels different than the other headaches.  You feel sick to your stomach (nauseous) or you throw up (vomit).  You have a fever. Get help right away if:  Your headache becomes really bad.  You keep throwing up.  You have a  stiff neck.  You have trouble seeing.  You have trouble speaking.  You have pain in the eye or ear.  Your muscles are weak or you lose muscle control.  You lose your balance or have trouble walking.  You feel like you will pass out (faint) or you pass out.  You have confusion. This information is not intended to replace advice given to you by your health care provider. Make sure you discuss any questions you have with your health care provider. Document Released: 12/30/2007 Document Revised: 08/28/2015 Document Reviewed: 07/15/2014 Elsevier Interactive Patient Education  2018 [employer].  Dizziness Dizziness is a common problem. It is a feeling of unsteadiness or light-headedness. You may feel like you are about to faint. Dizziness can lead to injury if you stumble or fall. Anyone can become dizzy, but dizziness is more common in older adults. This condition can be caused by a number of things, including medicines, dehydration, or illness. Follow these instructions at home: Eating and drinking  Drink enough fluid to keep your urine clear or pale yellow. This helps to keep you from becoming dehydrated. Try to drink more clear fluids, such as water.  Do not drink alcohol.  Limit your caffeine intake if told to do so by your health care provider. Check ingredients and nutrition facts to see if a food or beverage contains caffeine.  Limit your salt (  sodium) intake if told to do so by your health care provider. Check ingredients and nutrition facts to see if a food or beverage contains sodium. Activity  Avoid making quick movements. ? Rise slowly from chairs and steady yourself until you feel okay. ? In the morning, first sit up on the side of the bed. When you feel okay, stand slowly while you hold onto something until you know that your balance is fine.  If you need to stand in one place for a long time, move your legs often. Tighten and relax the muscles in your legs while you  are standing.  Do not drive or use heavy machinery if you feel dizzy.  Avoid bending down if you feel dizzy. Place items in your home so that they are easy for you to reach without leaning over. Lifestyle  Do not use any products that contain nicotine or tobacco, such as cigarettes and e-cigarettes. If you need help quitting, ask your health care provider.  Try to reduce your stress level by using methods such as yoga or meditation. Talk with your health care provider if you need help to manage your stress. General instructions  Watch your dizziness for any changes.  Take over-the-counter and prescription medicines only as told by your health care provider. Talk with your health care provider if you think that your dizziness is caused by a medicine that you are taking.  Tell a friend or a family member that you are feeling dizzy. If he or she notices any changes in your behavior, have this person call your health care provider.  Keep all follow-up visits as told by your health care provider. This is important. Contact a health care provider if:  Your dizziness does not go away.  Your dizziness or light-headedness gets worse.  You feel nauseous.  You have reduced hearing.  You have new symptoms.  You are unsteady on your feet or you feel like the room is spinning. Get help right away if:  You vomit or have diarrhea and are unable to eat or drink anything.  You have problems talking, walking, swallowing, or using your arms, hands, or legs.  You feel generally weak.  You are not thinking clearly or you have trouble forming sentences. It may take a friend or family member to notice this.  You have chest pain, abdominal pain, shortness of breath, or sweating.  Your vision changes.  You have any bleeding.  You have a severe headache.  You have neck pain or a stiff neck.  You have a fever. These symptoms may represent a serious problem that is an emergency. Do not wait  to see if the symptoms will go away. Get medical help right away. Call your local emergency services (911 in the [place] Do not drive yourself to the hospital. Summary  Dizziness is a feeling of unsteadiness or light-headedness. This condition can be caused by a number of things, including medicines, dehydration, or illness.  Anyone can become dizzy, but dizziness is more common in older adults.  Drink enough fluid to keep your urine clear or pale yellow. Do not drink alcohol.  Avoid making quick movements if you feel dizzy. Monitor your dizziness for any changes. This information is not intended to replace advice given to you by your health care provider. Make sure you discuss any questions you have with your health care provider. Document Released: 09/15/2000 Document Revised: 04/24/2016 Document Reviewed: 04/24/2016 Elsevier Interactive Patient Education  2018 [employer]. Debrox  ear drops over the counter   Earwax Buildup, Adult The ears produce a substance called earwax that helps keep bacteria out of the ear and protects the skin in the ear canal. Occasionally, earwax can build up in the ear and cause discomfort or hearing loss. What increases the risk? This condition is more likely to develop in people who:  Are female.  Are elderly.  Naturally produce more earwax.  Clean their ears often with cotton swabs.  Use earplugs often.  Use in-ear headphones often.  Wear hearing aids.  Have narrow ear canals.  Have earwax that is overly thick or sticky.  Have eczema.  Are dehydrated.  Have excess hair in the ear canal.  What are the signs or symptoms? Symptoms of this condition include:  Reduced or muffled hearing.  A feeling of fullness in the ear or feeling that the ear is plugged.  Fluid coming from the ear.  Ear pain.  Ear itch.  Ringing in the ear.  Coughing.  An obvious piece of earwax that can be seen inside the ear canal.  How is this  diagnosed? This condition may be diagnosed based on:  Your symptoms.  Your medical history.  An ear exam. During the exam, your health care provider will look into your ear with an instrument called an otoscope.  You may have tests, including a hearing test. How is this treated? This condition may be treated by:  Using ear drops to soften the earwax.  Having the earwax removed by a health care provider. The health care provider may: ? Flush the ear with water. ? Use an instrument that has a loop on the end (curette). ? Use a suction device.  Surgery to remove the wax buildup. This may be done in severe cases.  Follow these instructions at home:  Take over-the-counter and prescription medicines only as told by your health care provider.  Do not put any objects, including cotton swabs, into your ear. You can clean the opening of your ear canal with a washcloth or facial tissue.  Follow instructions from your health care provider about cleaning your ears. Do not over-clean your ears.  Drink enough fluid to keep your urine clear or pale yellow. This will help to thin the earwax.  Keep all follow-up visits as told by your health care provider. If earwax builds up in your ears often or if you use hearing aids, consider seeing your health care provider for routine, preventive ear cleanings. Ask your health care provider how often you should schedule your cleanings.  If you have hearing aids, clean them according to instructions from the manufacturer and your health care provider. Contact a health care provider if:  You have ear pain.  You develop a fever.  You have blood, pus, or other fluid coming from your ear.  You have hearing loss.  You have ringing in your ears that does not go away.  Your symptoms do not improve with treatment.  You feel like the room is spinning (vertigo). Summary  Earwax can build up in the ear and cause discomfort or hearing loss.  The most  common symptoms of this condition include reduced or muffled hearing and a feeling of fullness in the ear or feeling that the ear is plugged.  This condition may be diagnosed based on your symptoms, your medical history, and an ear exam.  This condition may be treated by using ear drops to soften the earwax or by having the earwax  removed by a health care provider.  Do not put any objects, including cotton swabs, into your ear. You can clean the opening of your ear canal with a washcloth or facial tissue. This information is not intended to replace advice given to you by your health care provider. Make sure you discuss any questions you have with your health care provider. Document Released: 04/29/2004 Document Revised: 06/02/2016 Document Reviewed: 06/02/2016 Elsevier Interactive Patient Education  [employer].

## 2017-07-29 ENCOUNTER — Telehealth: Payer: Self-pay

## 2017-07-29 NOTE — Telephone Encounter (Signed)
Copied from [place] [id number]. Topic: Referral - Question >> Jul 29, 2017  9:34 AM Ether Griffins B wrote: Reason for CRM: Debbie with [facility] in [place] calling in regards to pt's referral. They received the referral this morning but it was not signed by the provider. CB# [phone]

## 2017-08-22 ENCOUNTER — Ambulatory Visit
Admission: RE | Admit: 2017-08-22 | Discharge: 2017-08-22 | Disposition: A | Discharge status: Home / Self Care | Payer: 59 | Source: Ambulatory Visit | Attending: Internal Medicine | Admitting: Internal Medicine

## 2017-08-22 DIAGNOSIS — Z1231 Encounter for screening mammogram for malignant neoplasm of breast: Secondary | ICD-10-CM | POA: Diagnosis not present

## 2017-08-23 DIAGNOSIS — M25562 Pain in left knee: Secondary | ICD-10-CM | POA: Diagnosis not present

## 2017-10-11 DIAGNOSIS — M25512 Pain in left shoulder: Secondary | ICD-10-CM | POA: Diagnosis not present

## 2017-10-27 ENCOUNTER — Ambulatory Visit: Payer: 59 | Admitting: Internal Medicine

## 2017-10-27 VITALS — BP 130/78 | HR 82 | Temp 98.4°F | Resp 16 | Wt 196.2 lb

## 2017-10-27 DIAGNOSIS — R079 Chest pain, unspecified: Secondary | ICD-10-CM

## 2017-10-27 DIAGNOSIS — E049 Nontoxic goiter, unspecified: Secondary | ICD-10-CM | POA: Diagnosis not present

## 2017-10-27 DIAGNOSIS — K2100 Gastro-esophageal reflux disease with esophagitis, without bleeding: Secondary | ICD-10-CM

## 2017-10-27 DIAGNOSIS — D708 Other neutropenia: Secondary | ICD-10-CM

## 2017-10-27 DIAGNOSIS — R42 Dizziness and giddiness: Secondary | ICD-10-CM

## 2017-10-27 DIAGNOSIS — E7800 Pure hypercholesterolemia, unspecified: Secondary | ICD-10-CM

## 2017-10-27 DIAGNOSIS — K2270 Barrett's esophagus without dysplasia: Secondary | ICD-10-CM

## 2017-10-27 DIAGNOSIS — D72819 Decreased white blood cell count, unspecified: Secondary | ICD-10-CM

## 2017-10-27 NOTE — Progress Notes (Signed)
Patient ID: Kelly Massey, female   DOB: 08-25-1965, 52 y.o.   MRN: [id number]   Subjective:    Patient ID: Kelly Massey, female    DOB: April 06, 1965, 52 y.o.   MRN: [id number]  HPI  Patient here for a scheduled follow up.  Had been having some pain in her left shoulder.  S/p injection.  Helped.  Tries to stay active.  Does report occasionally noticing some chest pain.  Intermittent.  No known triggers.  Acid reflux controlled.  On zantac.  Does not feel like acid reflux.  Does not appear to specifically occur with increased activity.  Breathing stable.  No abdominal pain.  Bowels moving.  Previously evaluated for dizziness.  See 07/27/17 note.  Trial of meclizine.  Doing better.  Still some occasional dizziness - room spinning.  Due to see neurology in the next 1-2 weeks for further evaluation.  Had MRI in 2018.      Past Medical History:  Diagnosis Date  . Allergy   . Anemia   . Arthritis    knees - no meds  . Asthma   . Colon polyps   . GERD (gastroesophageal reflux disease) 10/12/10   EGD, positive H. pylori  . HSV infection    History  . Hyperlipidemia    ? no meds - diet controlled  . Post-operative nausea and vomiting   . Seasonal allergies    Past Surgical History:  Procedure Laterality Date  . ABDOMINAL HYSTERECTOMY    . BALLOON DILATION N/A 07/24/2012   Procedure: BALLOON DILATION;  Surgeon: Inda Castle, MD;  Location: [facility];  Service: Endoscopy;  Laterality: N/A;  . BRAVO [place] STUDY N/A 07/24/2012   Procedure: BRAVO [place] STUDY;  Surgeon: Inda Castle, MD;  Location: [facility];  Service: Endoscopy;  Laterality: N/A;  . DILATION AND CURETTAGE OF UTERUS     SAB  . ESOPHAGOGASTRODUODENOSCOPY N/A 07/24/2012   Procedure: ESOPHAGOGASTRODUODENOSCOPY (EGD);  Surgeon: Inda Castle, MD;  Location: [facility];  Service: Endoscopy;  Laterality: N/A;  . OVARIAN CYST REMOVAL  2004   laparotomy -left  . ROBOTIC ASSISTED LAPAROSCOPIC LYSIS OF ADHESION N/A 03/21/2014   Procedure: ROBOTIC ASSISTED LAPAROSCOPIC EXTENSIVE LYSIS OF ADHESION (1 Hour);  Surgeon: Marvene Staff, MD;  Location: [facility];  Service: Gynecology;  Laterality: N/A;  . ROBOTIC ASSISTED SALPINGO OOPHERECTOMY Left 03/21/2014   Procedure:  ROBOTIC ASSISTED LEFT OOPHORECTOMY;  Surgeon: Marvene Staff, MD;  Location: [facility];  Service: Gynecology;  Laterality: Left;  . SHOULDER ARTHROSCOPY Left   . TENNIS ELBOW RELEASE/NIRSCHEL PROCEDURE Right 05/01/2015   Procedure: RIGHT ELBOW DEBRIDEMENT AND TENDON REPAIR;  Surgeon: [name], MD;  Location: [facility];  Service: Orthopedics;  Laterality: Right;  . WISDOM TOOTH EXTRACTION     Family History  Problem Relation Age of Onset  . Arthritis Mother   . Stroke Mother   . Hypertension Mother   . Heart failure Mother   . Dementia Mother   . Breast cancer Maternal Aunt   . Prostate cancer Maternal Uncle        x 2  . Diabetes Sister   . Diabetes Brother        x 2  . Diabetes Paternal Grandmother   . Hypertension Other   . Hyperlipidemia Other   . Diabetes Paternal Grandfather   . Lung cancer Father   . Diabetes Maternal Uncle   . Throat cancer Maternal Aunt  Smoker  . Colon cancer Neg Hx    Social History   Socioeconomic History  . Marital status: Married    Spouse name: Not on file  . Number of children: 0  . Years of education: Not on file  . Highest education level: Not on file  Occupational History  . Occupation: RESEARCH    Employer: [employer]  . Financial resource strain: Not on file  . Food insecurity:    Worry: Not on file    Inability: Not on file  . Transportation needs:    Medical: Not on file    Non-medical: Not on file  Tobacco Use  . Smoking status: Never Smoker  . Smokeless tobacco: Never Used  Substance and Sexual Activity  . Alcohol use: No    Alcohol/week: 0.0 oz  . Drug use: No  . Sexual activity: Yes    Birth control/protection: None     Comment: hysterectomy  Lifestyle  . Physical activity:    Days per week: Not on file    Minutes per session: Not on file  . Stress: Not on file  Relationships  . Social connections:    Talks on phone: Not on file    Gets together: Not on file    Attends religious service: Not on file    Active member of club or organization: Not on file    Attends meetings of clubs or organizations: Not on file    Relationship status: Not on file  Other Topics Concern  . Not on file  Social History Narrative   [facility]     Outpatient Encounter Medications as of 10/27/2017  Medication Sig  . Calcium Carbonate-Vitamin D (CALCIUM 600+D3) 600-400 MG-UNIT per tablet Take 1 tablet by mouth daily.  . ferrous sulfate 325 (65 FE) MG tablet Take 325 mg by mouth daily.   . meclizine (ANTIVERT) 12.5 MG tablet Take 1-2 tablets (12.5-25 mg total) by mouth 2 (two) times daily as needed for dizziness.  . mometasone (NASONEX) 50 MCG/ACT nasal spray PLACE 2 SPRAYS INTO THE NOSE DAILY.  . Multiple Vitamins-Minerals (MULTIVITAMIN WITH MINERALS) tablet Take 1 tablet by mouth daily.  . ranitidine (ZANTAC) 150 MG tablet Take 300 mg by mouth daily as needed for heartburn.   . vitamin E 400 UNIT capsule Take 400 Units by mouth daily. Reported on 10/09/2015  . [DISCONTINUED] acyclovir (ZOVIRAX) 400 MG tablet TAKE 1 TABLET (400 MG TOTAL) BY MOUTH 2 (TWO) TIMES DAILY AS NEEDED (FOR OUTBREAKS).  . [DISCONTINUED] azithromycin (ZITHROMAX) 250 MG tablet 2 pills today, 1 pill day 2-5   No facility-administered encounter medications on file as of 10/27/2017.     Review of Systems  Constitutional: Negative for appetite change and unexpected weight change.  HENT: Negative for congestion and sinus pressure.   Respiratory: Negative for cough and shortness of breath.   Cardiovascular: Positive for chest pain. Negative for palpitations and leg swelling.  Gastrointestinal: Negative for abdominal pain, diarrhea, nausea and vomiting.    Genitourinary: Negative for difficulty urinating and dysuria.  Musculoskeletal: Negative for joint swelling and myalgias.  Skin: Negative for color change and rash.  Neurological: Positive for dizziness and light-headedness. Negative for headaches.  Psychiatric/Behavioral: Negative for agitation and dysphoric mood.       Objective:    Physical Exam  Constitutional: She appears well-developed and well-nourished. No distress.  HENT:  Nose: Nose normal.  Mouth/Throat: Oropharynx is clear and moist.  Neck: Neck supple. No thyromegaly present.  Cardiovascular: Normal rate and regular rhythm.  Pulmonary/Chest: Breath sounds normal. No respiratory distress. She has no wheezes.  Abdominal: Soft. Bowel sounds are normal. There is no tenderness.  Musculoskeletal: She exhibits no edema or tenderness.  Lymphadenopathy:    She has no cervical adenopathy.  Skin: No rash noted. No erythema.  Psychiatric: She has a normal mood and affect. Her behavior is normal.    BP 130/78 (BP Location: Left Arm, Patient Position: Sitting, Cuff Size: Normal)   Pulse 82   Temp 98.4 F (36.9 C) (Oral)   Resp 16   Wt 196 lb 3.2 oz (89 kg)   LMP 10/06/2011   SpO2 95%   BMI 31.67 kg/m  Wt Readings from Last 3 Encounters:  10/27/17 196 lb 3.2 oz (89 kg)  07/27/17 200 lb 6.4 oz (90.9 kg)  02/22/17 201 lb (91.2 kg)     Lab Results  Component Value Date   WBC 3.7 02/22/2017   HGB 13.9 02/22/2017   HCT 41.2 02/22/2017   PLT 213 02/22/2017   GLUCOSE 90 07/28/2016   CHOL 200 08/06/2015   TRIG 93.0 08/06/2015   HDL 67.70 08/06/2015   LDLCALC 114 (H) 08/06/2015   ALT 10 07/28/2016   AST 14 07/28/2016   NA 136 07/28/2016   K 4.6 07/28/2016   CL 102 07/28/2016   CREATININE 0.72 07/28/2016   BUN 7 07/28/2016   CO2 29 07/28/2016   TSH 0.76 07/28/2016    Mm 3d Screen Breast Bilateral  Result Date: 08/22/2017 CLINICAL DATA:  Screening. EXAM: DIGITAL SCREENING BILATERAL MAMMOGRAM WITH TOMO AND CAD  COMPARISON:  Previous exam(s). ACR Breast Density Category c: The breast tissue is heterogeneously dense, which may obscure small masses. FINDINGS: There are no findings suspicious for malignancy. Images were processed with CAD. IMPRESSION: No mammographic evidence of malignancy. A result letter of this screening mammogram will be mailed directly to the patient. RECOMMENDATION: Screening mammogram in one year. (Code:SM-B-01Y) BI-RADS CATEGORY  1: Negative. Electronically Signed   By: Lillia Mountain M.D.   On: 08/22/2017 13:08       Assessment & Plan:   Problem List Items Addressed This Visit    Barrett's esophagus    Followed by GI.  On zantac.  Controlling acid reflux.        Chest pain - Primary    Intermittent chest pain as outlined.  No specific triggers.  EKG - SR with no acute ischemic changes.  Acid reflux controlled.  Will have cardiology evaluate to confirm no further cardiac w/up warranted.        Relevant Orders   EKG 12-Lead (Completed)   Ambulatory referral to Cardiology   Enlarged thyroid    Previous ultrasound - no nodules.  Follow tsh.        Relevant Orders   TSH   GERD (gastroesophageal reflux disease)    Controlled on acid reflux.  On zantac.       Hypercholesterolemia    Low cholesterol diet and exercise.  Follow lipid panel.        Relevant Orders   Hepatic function panel   Lipid panel   Basic metabolic panel   Leukopenia    Has been worked up by hematology.  Felt to likely be related to dexilant.  On zantac now.  Follow cbc.        Relevant Orders   CBC with Differential/Platelet   Other neutropenia ([place])    Previously worked up by hematology.  Follow cbc.  Vertigo    Previous MRI unrevealing.  Still some intermittent issues.  Reevaluated recently.  Due to see neurology in the next 1-2 weeks.            Einar Pheasant, MD

## 2017-10-30 ENCOUNTER — Encounter: Payer: Self-pay | Admitting: Internal Medicine

## 2017-10-30 DIAGNOSIS — R079 Chest pain, unspecified: Secondary | ICD-10-CM | POA: Insufficient documentation

## 2017-10-30 NOTE — Assessment & Plan Note (Signed)
Controlled on acid reflux.  On zantac.

## 2017-10-30 NOTE — Assessment & Plan Note (Signed)
Intermittent chest pain as outlined.  No specific triggers.  EKG - SR with no acute ischemic changes.  Acid reflux controlled.  Will have cardiology evaluate to confirm no further cardiac w/up warranted.

## 2017-10-30 NOTE — Assessment & Plan Note (Signed)
Previous MRI unrevealing.  Still some intermittent issues.  Reevaluated recently.  Due to see neurology in the next 1-2 weeks.

## 2017-10-30 NOTE — Assessment & Plan Note (Signed)
Previously worked up by hematology.  Follow cbc.  

## 2017-10-30 NOTE — Assessment & Plan Note (Signed)
Previous ultrasound - no nodules.  Follow tsh.  

## 2017-10-30 NOTE — Assessment & Plan Note (Signed)
Followed by GI.  On zantac.  Controlling acid reflux.

## 2017-10-30 NOTE — Assessment & Plan Note (Signed)
Has been worked up by hematology.  Felt to likely be related to dexilant.  On zantac now.  Follow cbc.

## 2017-10-30 NOTE — Assessment & Plan Note (Signed)
Low cholesterol diet and exercise.  Follow lipid panel.   

## 2017-11-24 ENCOUNTER — Other Ambulatory Visit: Payer: Self-pay | Admitting: Neurology

## 2017-11-24 DIAGNOSIS — H9313 Tinnitus, bilateral: Secondary | ICD-10-CM | POA: Diagnosis not present

## 2017-11-24 DIAGNOSIS — R42 Dizziness and giddiness: Secondary | ICD-10-CM | POA: Diagnosis not present

## 2017-11-24 DIAGNOSIS — E559 Vitamin D deficiency, unspecified: Secondary | ICD-10-CM | POA: Diagnosis not present

## 2017-11-24 DIAGNOSIS — R51 Headache: Secondary | ICD-10-CM | POA: Diagnosis not present

## 2017-11-24 DIAGNOSIS — E538 Deficiency of other specified B group vitamins: Secondary | ICD-10-CM | POA: Diagnosis not present

## 2017-11-30 ENCOUNTER — Encounter: Payer: Self-pay | Admitting: Cardiovascular Disease

## 2017-11-30 ENCOUNTER — Ambulatory Visit: Payer: 59 | Admitting: Cardiovascular Disease

## 2017-11-30 VITALS — BP 130/78 | HR 72 | Ht 65.5 in | Wt 190.8 lb

## 2017-11-30 DIAGNOSIS — R0789 Other chest pain: Secondary | ICD-10-CM

## 2017-11-30 NOTE — Assessment & Plan Note (Signed)
Ms. Kelly Massey was referred by Dr. Einar Pheasant for atypical chest pain.  She has no cardiac risk factors.  The pain started 6 months ago, occurs monthly last for minutes at a time.  She describes it as a pinching/burning pain.  There are no other associated symptoms.  She has no risk factors.  I am going to order a routine GXT to further evaluate.

## 2017-11-30 NOTE — Progress Notes (Signed)
11/30/2017 [place]   1965-10-02  [id number]  Primary Physician Einar Pheasant, MD Primary Cardiologist: Lorretta Harp MD Lupe Carney, [place]  HPI:  Kelly Massey is a 52 y.o. moderately overweight married African-American female with no children works as a Gaffer.  She was referred by Dr. Einar Pheasant for cardiovascular evaluation because of atypical chest pain.  She really has no cardiac risk factors.  She does have GERD.  She is never had a heart attack or stroke.  She began noticing chest pain 6 months ago that occurs monthly, lasts minutes at a time and is described as a pinching/burning sensation.  It occurs randomly without associated with activities, position or meals..   Current Meds  Medication Sig  . Calcium Carbonate-Vitamin D (CALCIUM 600+D3) 600-400 MG-UNIT per tablet Take 1 tablet by mouth daily.  . ferrous sulfate 325 (65 FE) MG tablet Take 325 mg by mouth daily.   . meclizine (ANTIVERT) 12.5 MG tablet Take 1-2 tablets (12.5-25 mg total) by mouth 2 (two) times daily as needed for dizziness.  . mometasone (NASONEX) 50 MCG/ACT nasal spray PLACE 2 SPRAYS INTO THE NOSE DAILY.  . Multiple Vitamins-Minerals (MULTIVITAMIN WITH MINERALS) tablet Take 1 tablet by mouth daily.  . ranitidine (ZANTAC) 150 MG tablet Take 300 mg by mouth daily as needed for heartburn.   . vitamin E 400 UNIT capsule Take 400 Units by mouth daily. Reported on 10/09/2015     Allergies  Allergen Reactions  . Adhesive [Tape] Other (See Comments)    Skin appeared "burned" after last surg.  . Aspirin Nausea And Vomiting  . Ibuprofen Nausea And Vomiting  . Penicillins Diarrhea  . Nsaids Other (See Comments)    GI pain  . Oxycodone Other (See Comments)    Severe abdominal cramps  . Shrimp [Shellfish Allergy] Nausea And Vomiting    Social History   Socioeconomic History  . Marital status: Married    Spouse name: Not on file  . Number of children: 0  . Years of education:  Not on file  . Highest education level: Not on file  Occupational History  . Occupation: RESEARCH    Employer: [employer]  . Financial resource strain: Not on file  . Food insecurity:    Worry: Not on file    Inability: Not on file  . Transportation needs:    Medical: Not on file    Non-medical: Not on file  Tobacco Use  . Smoking status: Never Smoker  . Smokeless tobacco: Never Used  Substance and Sexual Activity  . Alcohol use: No    Alcohol/week: 0.0 standard drinks  . Drug use: No  . Sexual activity: Yes    Birth control/protection: None    Comment: hysterectomy  Lifestyle  . Physical activity:    Days per week: Not on file    Minutes per session: Not on file  . Stress: Not on file  Relationships  . Social connections:    Talks on phone: Not on file    Gets together: Not on file    Attends religious service: Not on file    Active member of club or organization: Not on file    Attends meetings of clubs or organizations: Not on file    Relationship status: Not on file  . Intimate partner violence:    Fear of current or ex partner: Not on file    Emotionally abused: Not on file  Physically abused: Not on file    Forced sexual activity: Not on file  Other Topics Concern  . Not on file  Social History Narrative   [place]      Review of Systems: General: negative for chills, fever, night sweats or weight changes.  Cardiovascular: negative for chest pain, dyspnea on exertion, edema, orthopnea, palpitations, paroxysmal nocturnal dyspnea or shortness of breath Dermatological: negative for rash Respiratory: negative for cough or wheezing Urologic: negative for hematuria Abdominal: negative for nausea, vomiting, diarrhea, bright red blood per rectum, melena, or hematemesis Neurologic: negative for visual changes, syncope, or dizziness All other systems reviewed and are otherwise negative except as noted above.    Blood pressure 130/78,  pulse 72, height 5' 5.5" (1.664 m), weight 190 lb 12.8 oz (86.5 kg), last menstrual period 10/06/2011.  General appearance: alert and no distress Neck: no adenopathy, no carotid bruit, no JVD, supple, symmetrical, trachea midline and thyroid not enlarged, symmetric, no tenderness/mass/nodules Lungs: clear to auscultation bilaterally Heart: regular rate and rhythm, S1, S2 normal, no murmur, click, rub or gallop Extremities: extremities normal, atraumatic, no cyanosis or edema Pulses: 2+ and symmetric Skin: Skin color, texture, turgor normal. No rashes or lesions Neurologic: Alert and oriented X 3, normal strength and tone. Normal symmetric reflexes. Normal coordination and gait  EKG normal sinus rhythm at 74 without ST or T wave changes.  I personally reviewed this EKG.  ASSESSMENT AND PLAN:   Chest pain [name] was referred by Dr. Einar Pheasant for atypical chest pain.  She has no cardiac risk factors.  The pain started 6 months ago, occurs monthly last for minutes at a time.  She describes it as a pinching/burning pain.  There are no other associated symptoms.  She has no risk factors.  I am going to order a routine GXT to further evaluate.      Lorretta Harp MD FACP,FACC,FAHA, [facility] 11/30/2017 9:58 AM

## 2017-11-30 NOTE — Patient Instructions (Signed)
Medication Instructions:  Your physician recommends that you continue on your current medications as directed. Please refer to the Current Medication list given to you today.   Labwork: none  Testing/Procedures: Your physician has requested that you have an exercise tolerance test. For further information please visit [email or web address]. Please also follow instruction sheet, as given.    Follow-Up: Follow up with Dr. Gwenlyn Found as needed.   Any Other Special Instructions Will Be Listed Below (If Applicable).     If you need a refill on your cardiac medications before your next appointment, please call your pharmacy.

## 2017-12-06 ENCOUNTER — Telehealth (HOSPITAL_COMMUNITY): Payer: Self-pay

## 2017-12-06 NOTE — Telephone Encounter (Signed)
Encounter complete. 

## 2017-12-08 ENCOUNTER — Ambulatory Visit (HOSPITAL_COMMUNITY)
Admission: RE | Admit: 2017-12-08 | Discharge: 2017-12-08 | Disposition: A | Discharge status: Home / Self Care | Payer: 59 | Source: Ambulatory Visit | Attending: Cardiovascular Disease | Admitting: Cardiovascular Disease

## 2017-12-08 DIAGNOSIS — R0789 Other chest pain: Secondary | ICD-10-CM | POA: Insufficient documentation

## 2017-12-08 LAB — EXERCISE TOLERANCE TEST
Estimated workload: 9.5 METS
Exercise duration (min): 7 min
Exercise duration (sec): 38 s
MPHR: 166 {beats}/min
Peak HR: 179 {beats}/min
Percent HR: 106 %
RPE: 19
Rest HR: 78 {beats}/min

## 2017-12-12 ENCOUNTER — Ambulatory Visit: Payer: 59

## 2017-12-13 ENCOUNTER — Ambulatory Visit
Admission: RE | Admit: 2017-12-13 | Discharge: 2017-12-13 | Disposition: A | Discharge status: Home / Self Care | Payer: 59 | Source: Ambulatory Visit | Attending: Neurology | Admitting: Neurology

## 2017-12-13 DIAGNOSIS — R42 Dizziness and giddiness: Secondary | ICD-10-CM | POA: Diagnosis not present

## 2017-12-13 DIAGNOSIS — R51 Headache: Secondary | ICD-10-CM | POA: Insufficient documentation

## 2017-12-13 DIAGNOSIS — H9313 Tinnitus, bilateral: Secondary | ICD-10-CM | POA: Diagnosis not present

## 2017-12-13 MED ORDER — GADOBENATE DIMEGLUMINE 529 MG/ML IV SOLN
20.0000 mL | Freq: Once | INTRAVENOUS | Status: AC | PRN
  Administered 2017-12-13: 18 mL via INTRAVENOUS

## 2017-12-13 MED ORDER — GADOBENATE DIMEGLUMINE 529 MG/ML IV SOLN: 15.0000 mL | Freq: Once | INTRAVENOUS | Status: DC | PRN

## 2017-12-16 ENCOUNTER — Encounter: Payer: 59 | Admitting: Internal Medicine

## 2017-12-19 DIAGNOSIS — R42 Dizziness and giddiness: Secondary | ICD-10-CM | POA: Diagnosis not present

## 2018-01-15 ENCOUNTER — Other Ambulatory Visit: Payer: Self-pay | Admitting: Internal Medicine

## 2018-02-01 DIAGNOSIS — Z6830 Body mass index (BMI) 30.0-30.9, adult: Secondary | ICD-10-CM | POA: Diagnosis not present

## 2018-02-01 DIAGNOSIS — Z01419 Encounter for gynecological examination (general) (routine) without abnormal findings: Secondary | ICD-10-CM | POA: Diagnosis not present

## 2018-02-09 ENCOUNTER — Telehealth: Payer: Self-pay | Admitting: Gastroenterology

## 2018-02-09 NOTE — Telephone Encounter (Signed)
Patient advised not due until July 2022 for next screening colonoscopy. Recall placed in our system. Patient states she does not have any family history of colon cancer.

## 2018-02-09 NOTE — Telephone Encounter (Signed)
Routed to Dr. Havery Moros, I also see where she saw someone at [facility] in 2017. Please advised on when due for colonoscopy. Thanks.

## 2018-02-09 NOTE — Telephone Encounter (Signed)
These are the results from her last colonoscopy: Colonoscopy 7/12 - left sided benign hyperplastic polyps, no adenomas  If she has no strong family history of colon cancer, due in July 2022 for her next screening. Thanks

## 2018-02-09 NOTE — Telephone Encounter (Signed)
Patient states she is due for her next colon per pcp. Patient last colon on record was 2012. Patient was former Dr.Kaplan pt but last seen by Dr.Armbruster in 2016. Please advise on scheduling next colon.

## 2018-02-23 ENCOUNTER — Ambulatory Visit: Payer: 59 | Admitting: Internal Medicine

## 2018-02-23 ENCOUNTER — Encounter

## 2018-02-23 VITALS — BP 122/78 | HR 85 | Temp 98.1°F | Resp 18 | Ht 65.5 in | Wt 188.0 lb

## 2018-02-23 DIAGNOSIS — K2270 Barrett's esophagus without dysplasia: Secondary | ICD-10-CM

## 2018-02-23 DIAGNOSIS — E7800 Pure hypercholesterolemia, unspecified: Secondary | ICD-10-CM

## 2018-02-23 DIAGNOSIS — D72819 Decreased white blood cell count, unspecified: Secondary | ICD-10-CM

## 2018-02-23 DIAGNOSIS — R079 Chest pain, unspecified: Secondary | ICD-10-CM | POA: Diagnosis not present

## 2018-02-23 DIAGNOSIS — Z0000 Encounter for general adult medical examination without abnormal findings: Secondary | ICD-10-CM | POA: Diagnosis not present

## 2018-02-23 DIAGNOSIS — Z9109 Other allergy status, other than to drugs and biological substances: Secondary | ICD-10-CM | POA: Diagnosis not present

## 2018-02-23 MED ORDER — ACYCLOVIR 400 MG PO TABS: 400.0000 mg | ORAL_TABLET | Freq: Two times a day (BID) | ORAL | 1 refills | Status: DC | PRN

## 2018-02-23 NOTE — Assessment & Plan Note (Addendum)
Physical today 02/23/18.  Is s/p hysterectomy.  Mammogram 08/22/17 - Birads I.  Colonoscopy 10/2010.  Need to confirm with GI when f/u due.

## 2018-02-23 NOTE — Progress Notes (Signed)
Patient ID: Kelly Massey, female   DOB: 07-23-1965, 52 y.o.   MRN: [id number]   Subjective:    Patient ID: Kelly Massey, female    DOB: [birth year], 52 y.o.   MRN: [id number]  HPI  Patient here for her physical exam.  Recently evaluated by cardiology for chest pain.  Note reviewed.  Had exercise treadmill test.  Had a hypertensive response to exercise.  Non specific upsloping ST depression noted inferiorly and in V4/V5.  No definite evidence of ischemia.  Has f/u scheduled with cardiology.  Not on PPI.  No acid reflux.  Eating.  No further chest pain.  No sob.  No abdominal pain.  Bowels moving.  Sleeping well.     Past Medical History:  Diagnosis Date  . Allergy   . Anemia   . Arthritis    knees - no meds  . Asthma   . Colon polyps   . GERD (gastroesophageal reflux disease) 10/12/10   EGD, positive H. pylori  . HSV infection    History  . Hyperlipidemia    ? no meds - diet controlled  . Post-operative nausea and vomiting   . Seasonal allergies    Past Surgical History:  Procedure Laterality Date  . ABDOMINAL HYSTERECTOMY    . BALLOON DILATION N/A 07/24/2012   Procedure: BALLOON DILATION;  Surgeon: Inda Castle, MD;  Location: [facility];  Service: Endoscopy;  Laterality: N/A;  . BRAVO [place] STUDY N/A 07/24/2012   Procedure: BRAVO [place] STUDY;  Surgeon: Inda Castle, MD;  Location: [facility];  Service: Endoscopy;  Laterality: N/A;  . DILATION AND CURETTAGE OF UTERUS     SAB  . ESOPHAGOGASTRODUODENOSCOPY N/A 07/24/2012   Procedure: ESOPHAGOGASTRODUODENOSCOPY (EGD);  Surgeon: Inda Castle, MD;  Location: [facility];  Service: Endoscopy;  Laterality: N/A;  . OVARIAN CYST REMOVAL  2004   laparotomy -left  . ROBOTIC ASSISTED LAPAROSCOPIC LYSIS OF ADHESION N/A 03/21/2014   Procedure: ROBOTIC ASSISTED LAPAROSCOPIC EXTENSIVE LYSIS OF ADHESION (1 Hour);  Surgeon: Marvene Staff, MD;  Location: [facility];  Service: Gynecology;  Laterality: N/A;  . ROBOTIC ASSISTED SALPINGO  OOPHERECTOMY Left 03/21/2014   Procedure:  ROBOTIC ASSISTED LEFT OOPHORECTOMY;  Surgeon: Marvene Staff, MD;  Location: [facility];  Service: Gynecology;  Laterality: Left;  . SHOULDER ARTHROSCOPY Left   . TENNIS ELBOW RELEASE/NIRSCHEL PROCEDURE Right 05/01/2015   Procedure: RIGHT ELBOW DEBRIDEMENT AND TENDON REPAIR;  Surgeon: [name], MD;  Location: [facility];  Service: Orthopedics;  Laterality: Right;  . WISDOM TOOTH EXTRACTION     Family History  Problem Relation Age of Onset  . Arthritis Mother   . Stroke Mother   . Hypertension Mother   . Heart failure Mother   . Dementia Mother   . Breast cancer Maternal Aunt   . Prostate cancer Maternal Uncle        x 2  . Diabetes Sister   . Diabetes Brother        x 2  . Diabetes Paternal Grandmother   . Hypertension Other   . Hyperlipidemia Other   . Diabetes Paternal Grandfather   . Lung cancer Father   . Diabetes Maternal Uncle   . Throat cancer Maternal Aunt        Smoker  . Colon cancer Neg Hx    Social History   Socioeconomic History  . Marital status: Married    Spouse name: Not on file  . Number of children:  0  . Years of education: Not on file  . Highest education level: Not on file  Occupational History  . Occupation: RESEARCH    Employer: [employer]  . Financial resource strain: Not on file  . Food insecurity:    Worry: Not on file    Inability: Not on file  . Transportation needs:    Medical: Not on file    Non-medical: Not on file  Tobacco Use  . Smoking status: Never Smoker  . Smokeless tobacco: Never Used  Substance and Sexual Activity  . Alcohol use: No    Alcohol/week: 0.0 standard drinks  . Drug use: No  . Sexual activity: Yes    Birth control/protection: None    Comment: hysterectomy  Lifestyle  . Physical activity:    Days per week: Not on file    Minutes per session: Not on file  . Stress: Not on file  Relationships  . Social connections:     Talks on phone: Not on file    Gets together: Not on file    Attends religious service: Not on file    Active member of club or organization: Not on file    Attends meetings of clubs or organizations: Not on file    Relationship status: Not on file  Other Topics Concern  . Not on file  Social History Narrative   [facility]     Outpatient Encounter Medications as of 02/23/2018  Medication Sig  . acyclovir (ZOVIRAX) 400 MG tablet Take 1 tablet (400 mg total) by mouth 2 (two) times daily as needed.  . Calcium Carbonate-Vitamin D (CALCIUM 600+D3) 600-400 MG-UNIT per tablet Take 1 tablet by mouth daily.  . ferrous sulfate 325 (65 FE) MG tablet Take 325 mg by mouth daily.   . mometasone (NASONEX) 50 MCG/ACT nasal spray PLACE 2 SPRAYS INTO THE NOSE DAILY.  . Multiple Vitamins-Minerals (MULTIVITAMIN WITH MINERALS) tablet Take 1 tablet by mouth daily.  . vitamin E 400 UNIT capsule Take 400 Units by mouth daily. Reported on 10/09/2015  . [DISCONTINUED] acyclovir (ZOVIRAX) 400 MG tablet Take 400 mg by mouth 2 (two) times daily as needed.  . [DISCONTINUED] meclizine (ANTIVERT) 12.5 MG tablet Take 1-2 tablets (12.5-25 mg total) by mouth 2 (two) times daily as needed for dizziness.  . [DISCONTINUED] ranitidine (ZANTAC) 150 MG tablet Take 300 mg by mouth daily as needed for heartburn.    No facility-administered encounter medications on file as of 02/23/2018.     Review of Systems  Constitutional: Negative for appetite change and unexpected weight change.  HENT: Negative for congestion and sinus pressure.   Eyes: Negative for pain and visual disturbance.  Respiratory: Negative for cough, chest tightness and shortness of breath.   Cardiovascular: Negative for chest pain, palpitations and leg swelling.  Gastrointestinal: Negative for abdominal pain, diarrhea and nausea.  Genitourinary: Negative for difficulty urinating and dysuria.  Musculoskeletal: Negative for joint swelling and myalgias.  Skin:  Negative for color change and rash.  Neurological: Negative for dizziness, light-headedness and headaches.  Hematological: Negative for adenopathy. Does not bruise/bleed easily.  Psychiatric/Behavioral: Negative for agitation and dysphoric mood.       Objective:    Physical Exam  Constitutional: She is oriented to person, place, and time. She appears well-developed and well-nourished. No distress.  HENT:  Nose: Nose normal.  Mouth/Throat: Oropharynx is clear and moist.  Eyes: Right eye exhibits no discharge. Left eye exhibits no discharge. No scleral icterus.  Neck: Neck supple. No thyromegaly present.  Cardiovascular: Normal rate and regular rhythm.  Pulmonary/Chest: Breath sounds normal. No accessory muscle usage. No tachypnea. No respiratory distress. She has no decreased breath sounds. She has no wheezes. She has no rhonchi. Right breast exhibits no inverted nipple, no mass, no nipple discharge and no tenderness (no axillary adenopathy). Left breast exhibits no inverted nipple, no mass, no nipple discharge and no tenderness (no axilarry adenopathy).  Abdominal: Soft. Bowel sounds are normal. There is no tenderness.  Musculoskeletal: She exhibits no edema or tenderness.  Lymphadenopathy:    She has no cervical adenopathy.  Neurological: She is alert and oriented to person, place, and time.  Skin: No rash noted. No erythema.  Psychiatric: She has a normal mood and affect. Her behavior is normal.    BP 122/78 (BP Location: Left Arm, Patient Position: Sitting, Cuff Size: Normal)   Pulse 85   Temp 98.1 F (36.7 C) (Oral)   Resp 18   Ht 5' 5.5" (1.664 m)   Wt 188 lb (85.3 kg)   LMP 10/06/2011   SpO2 99%   BMI 30.81 kg/m  Wt Readings from Last 3 Encounters:  02/23/18 188 lb (85.3 kg)  11/30/17 190 lb 12.8 oz (86.5 kg)  10/27/17 196 lb 3.2 oz (89 kg)     Lab Results  Component Value Date   WBC 3.7 02/22/2017   HGB 13.9 02/22/2017   HCT 41.2 02/22/2017   PLT 213  02/22/2017   GLUCOSE 90 07/28/2016   CHOL 200 08/06/2015   TRIG 93.0 08/06/2015   HDL 67.70 08/06/2015   LDLCALC 114 (H) 08/06/2015   ALT 10 07/28/2016   AST 14 07/28/2016   NA 136 07/28/2016   K 4.6 07/28/2016   CL 102 07/28/2016   CREATININE 0.72 07/28/2016   BUN 7 07/28/2016   CO2 29 07/28/2016   TSH 0.76 07/28/2016    Mr Brain/iac W Wo Contrast  Result Date: 12/14/2017 CLINICAL DATA:  Dizziness EXAM: MRI HEAD WITHOUT AND WITH CONTRAST TECHNIQUE: Multiplanar, multiecho pulse sequences of the brain and surrounding structures were obtained without and with intravenous contrast. CONTRAST:  30mL MULTIHANCE GADOBENATE DIMEGLUMINE 529 MG/ML IV SOLN COMPARISON:  MRI head 08/11/2016 FINDINGS: Brain: IAC protocol was performed including thin section imaging through the posterior fossa before and after intravenous contrast. Seventh and eighth cranial nerves normal. Basilar cisterns normal. Brainstem and cerebellum normal. Mastoid sinus clear bilaterally. No enhancing mass in the posterior fossa or temporal bone. Ventricle size normal. Negative for infarct hemorrhage or mass. Scattered small white matter hyperintensities bilaterally unchanged from the prior MRI. Pineal cyst unchanged. Vascular: Normal arterial flow voids Skull and upper cervical spine: Negative skull. Central disc protrusion and spinal stenosis C4-5. Sinuses/Orbits: Negative Other: None IMPRESSION: No acute abnormality. Scattered small white matter hyperintensities unchanged from the prior study. These may be related to chronic microvascular ischemia, migraine headaches, vasculitis. Pattern not typical for multiple sclerosis. Electronically Signed   By: Franchot Gallo M.D.   On: 12/14/2017 10:15       Assessment & Plan:   Problem List Items Addressed This Visit    Barrett's esophagus    Has been followed by GI.  On no medication now.  No symptoms.  No acid reflux.  Follow.       Chest pain    Previous chest pain.  Saw  cardiology.  Exercise stress test as outlined.  No pain now.  Has f/u planned with cardiology.  Environmental allergies    Controlled on current regimen.  Follow.        Health care maintenance    Physical today 02/23/18.  Is s/p hysterectomy.  Mammogram 08/22/17 - Birads I.  Colonoscopy 10/2010.  Need to confirm with GI when f/u due.        Hypercholesterolemia    Low cholesterol diet and exercise.  Follow lipid panel.        Leukopenia    Follow cbc.        Other Visit Diagnoses    Routine general medical examination at a health care facility    -  Primary       Einar Pheasant, MD

## 2018-02-26 ENCOUNTER — Encounter: Payer: Self-pay | Admitting: Internal Medicine

## 2018-02-26 NOTE — Assessment & Plan Note (Signed)
Has been followed by GI.  On no medication now.  No symptoms.  No acid reflux.  Follow.

## 2018-02-26 NOTE — Assessment & Plan Note (Signed)
Previous chest pain.  Saw cardiology.  Exercise stress test as outlined.  No pain now.  Has f/u planned with cardiology.

## 2018-02-26 NOTE — Assessment & Plan Note (Signed)
Low cholesterol diet and exercise.  Follow lipid panel.   

## 2018-02-26 NOTE — Assessment & Plan Note (Signed)
Controlled on current regimen.  Follow.  

## 2018-02-26 NOTE — Assessment & Plan Note (Signed)
Follow cbc.  

## 2018-02-27 DIAGNOSIS — E538 Deficiency of other specified B group vitamins: Secondary | ICD-10-CM | POA: Diagnosis not present

## 2018-02-27 DIAGNOSIS — G43109 Migraine with aura, not intractable, without status migrainosus: Secondary | ICD-10-CM | POA: Diagnosis not present

## 2018-05-10 ENCOUNTER — Encounter: Payer: Self-pay | Admitting: Internal Medicine

## 2018-06-06 ENCOUNTER — Telehealth: Payer: Self-pay

## 2018-06-06 NOTE — Telephone Encounter (Signed)
Copied from [id number]. Topic: Appointment Scheduling - Scheduling Inquiry for Clinic >> Jun 06, 2018 10:53 AM Sheran Luz wrote: Reason for CRM: Patient called to reschedule appointment on 3.9 due to provider being out of office. Next available appointment with provider is months out and patient inquired if she can be worked in sooner. Please advise.

## 2018-06-06 NOTE — Telephone Encounter (Signed)
Can we move appt up

## 2018-06-07 NOTE — Telephone Encounter (Signed)
The patient is aware that she has been scheduled for 4.2.20 @ 4:00.

## 2018-06-10 ENCOUNTER — Other Ambulatory Visit: Payer: Self-pay | Admitting: Internal Medicine

## 2018-06-12 ENCOUNTER — Ambulatory Visit: Payer: 59 | Admitting: Internal Medicine

## 2018-07-06 ENCOUNTER — Ambulatory Visit: Payer: 59 | Admitting: Internal Medicine

## 2018-08-04 ENCOUNTER — Other Ambulatory Visit: Payer: Self-pay | Admitting: Internal Medicine

## 2018-08-04 DIAGNOSIS — Z1239 Encounter for other screening for malignant neoplasm of breast: Secondary | ICD-10-CM

## 2018-08-23 IMAGING — MR MR HEAD WO/W CM
12 series · 48 of 48 positions shown · IV contrast (multihance)
Comparison: None.

CLINICAL DATA: Vertigo and dizziness for 3 weeks. 2-3 episodes per
day. Occasional headaches. Tinnitus.

EXAM:
MRI HEAD WITHOUT AND WITH CONTRAST
TECHNIQUE: Multiplanar, multiecho pulse sequences of the brain and surrounding
structures were obtained without and with intravenous contrast.
CONTRAST:  18mL MULTIHANCE GADOBENATE DIMEGLUMINE 529 MG/ML IV SOLN

[Series 2: T1 · sagittal · 5.0mm · 0.45mm/px · 1 of 25 slices shown (1 of 2)]
[im 1/25]
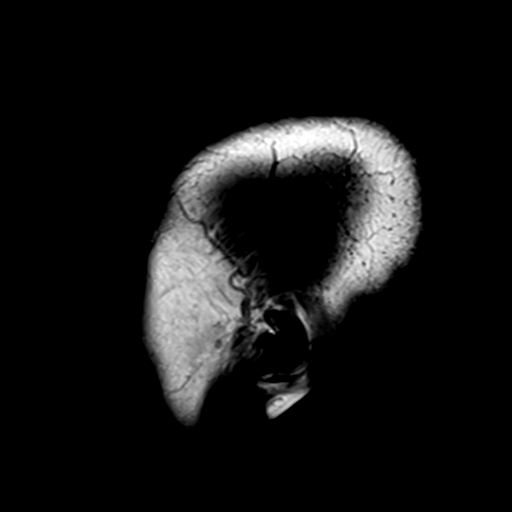

[Series 4: DWI · axial · 3.0mm · 1.80mm/px · z∈[-33,+127]mm · 4 of 55 slices shown (1 of 2)]
[im 1/55]
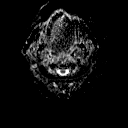
[im 19/55]
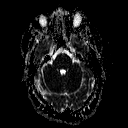
[im 37/55]
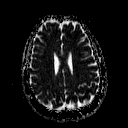
[im 55/55]
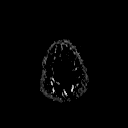

[Series 6: DWI · coronal · 3.0mm · 1.80mm/px · 3 of 45 slices shown (2 of 2)]
[im 1/45]
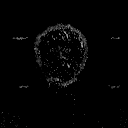
[im 23/45]
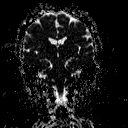
[im 45/45]
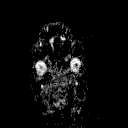

[Series 7: T2 · axial · 5.0mm · 0.60mm/px · z∈[-30,+125]mm · 2 of 25 slices shown (1 of 2)]
[im 1/25]
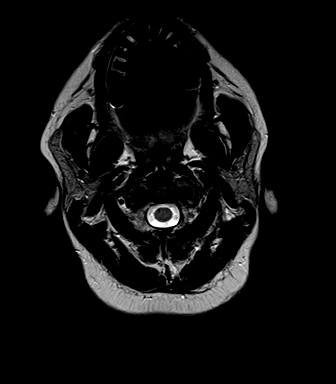
[im 25/25]
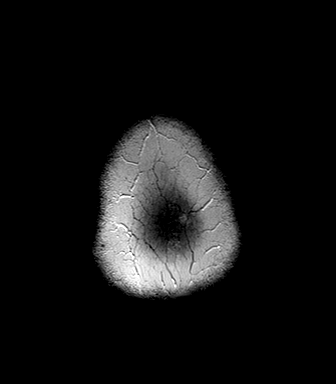

[Series 8: FLAIR · axial · 3.0mm · 0.45mm/px · z∈[-30,+125]mm · 3 of 53 slices shown]
[im 1/53]
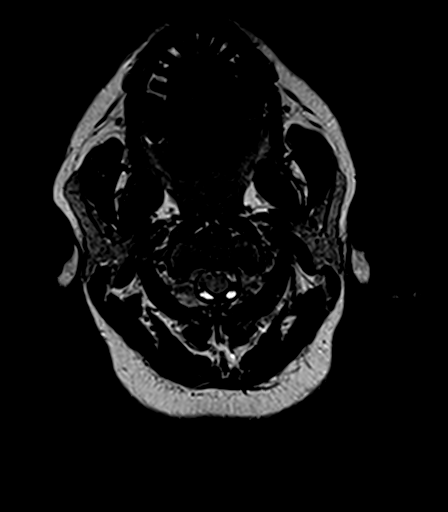
[im 27/53]
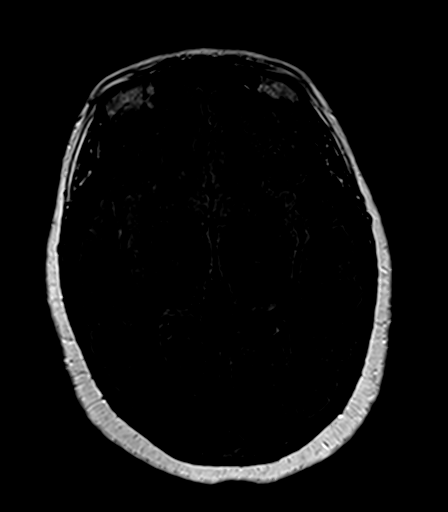
[im 53/53]
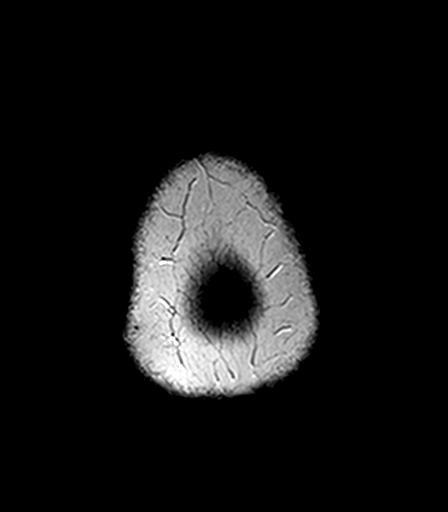

[Series 9: T2 · axial · 5.0mm · 0.45mm/px · z∈[-30,+125]mm · 2 of 25 slices shown (2 of 2)]
[im 1/25]
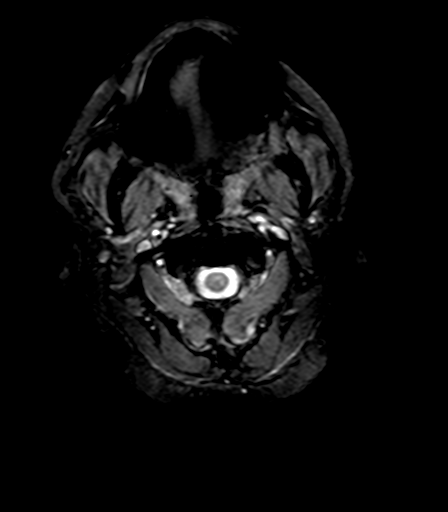
[im 25/25]
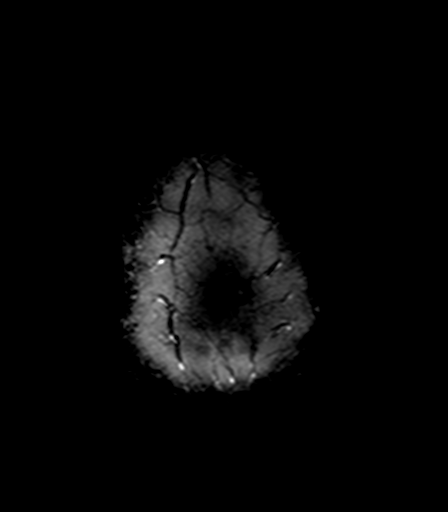

[Series 10: T1 · axial · 1.0mm · 1.00mm/px · z∈[-37,+136]mm · 11 of 176 slices shown (2 of 2)]
[im 1/176]
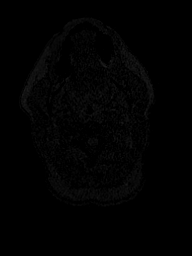
[im 18/176]
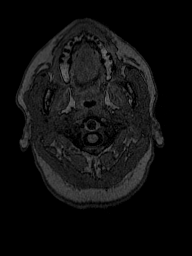
[im 36/176]
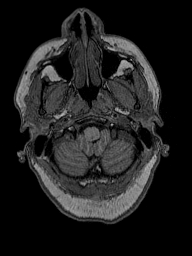
[im 53/176]
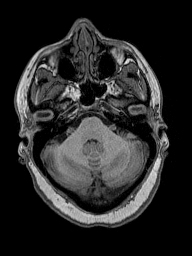
[im 71/176]
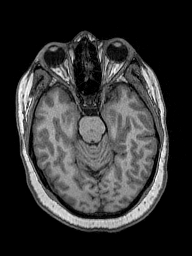
[im 88/176]
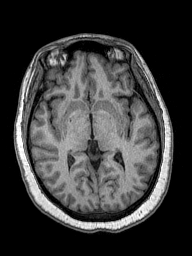
[im 106/176]
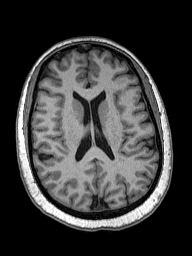
[im 123/176]
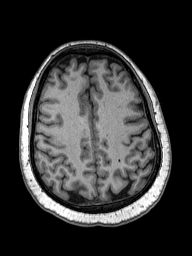
[im 141/176]
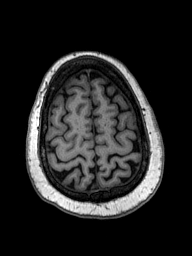
[im 158/176]
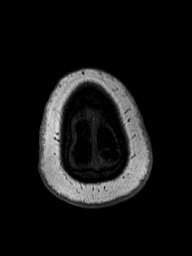
[im 176/176]
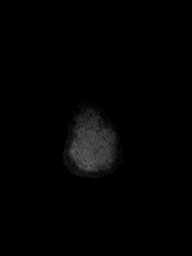

[Series 11: T2 post-contrast · coronal · 5.0mm · 0.49mm/px · 2 of 27 slices shown]
[im 1/27]
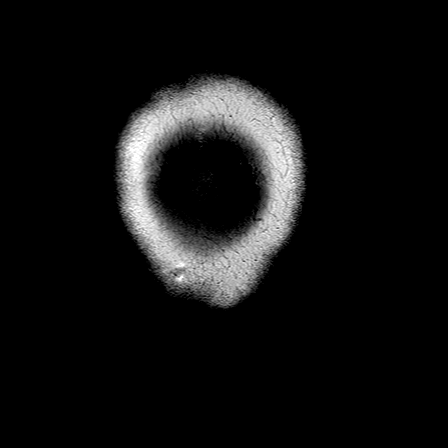
[im 27/27]
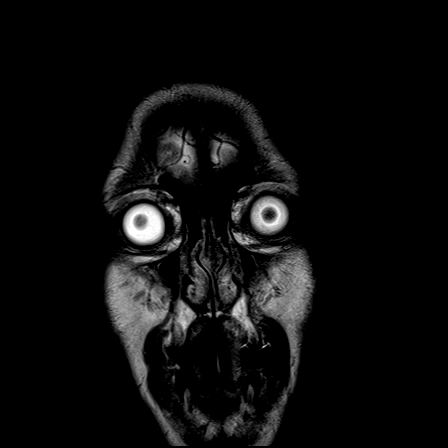

[Series 12: T1 post-contrast · axial · 1.0mm · 1.00mm/px · z∈[-37,+136]mm · 11 of 176 slices shown (1 of 2)]
[im 1/176]
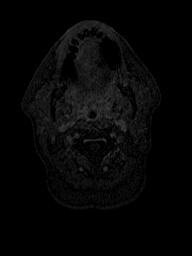
[im 18/176]
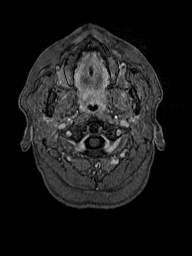
[im 36/176]
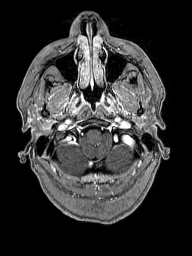
[im 53/176]
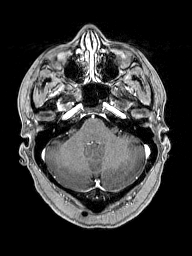
[im 71/176]
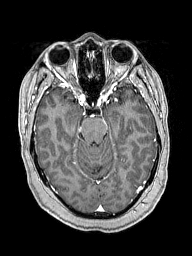
[im 88/176]
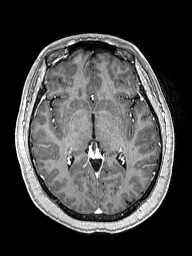
[im 106/176]
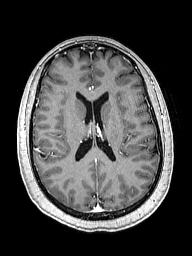
[im 123/176]
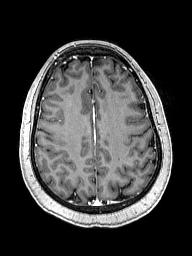
[im 141/176]
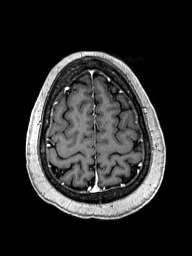
[im 158/176]
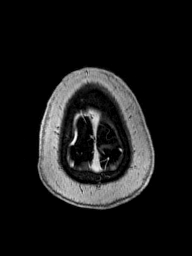
[im 176/176]
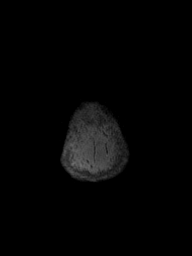

[Series 13: T1 post-contrast · coronal · 5.0mm · 0.43mm/px · 2 of 27 slices shown (2 of 2)]
[im 1/27]
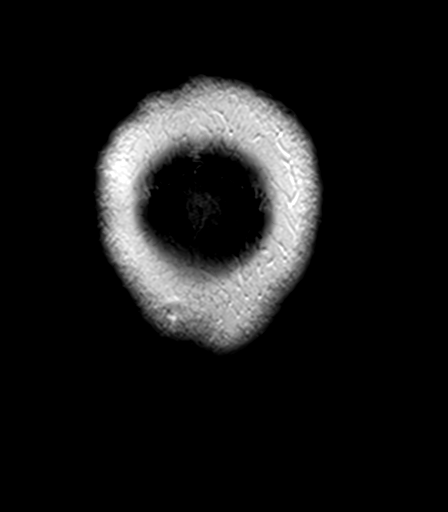
[im 27/27]
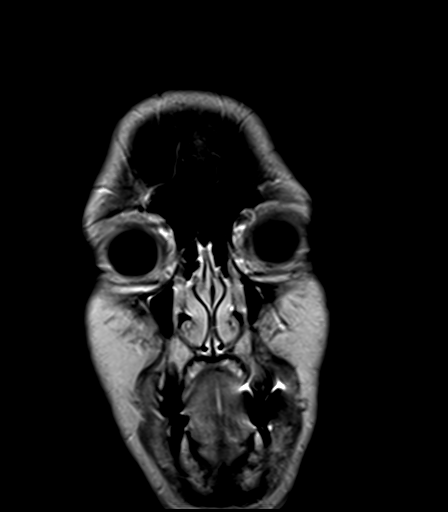

[Series 100: ax (id) · axial · 3.0mm · 1.80mm/px · z∈[-33,+127]mm · 4 of 56 slices shown]
[im 1/56]
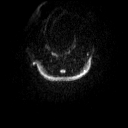
[im 19/56]
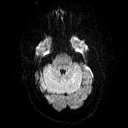
[im 37/56]
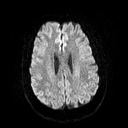
[im 56/56]
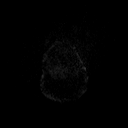

[Series 101: cor (id) · coronal · 3.0mm · 1.80mm/px · 3 of 45 slices shown]
[im 1/45]
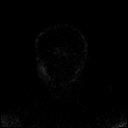
[im 23/45]
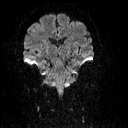
[im 45/45]
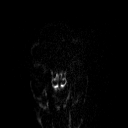

[48 of 48 positions shown; findings below may reference images not displayed]

FINDINGS: Brain: No acute infarct, hemorrhage, or mass lesion is present. The
ventricles are of normal size. No significant extraaxial fluid
collection is present. No significant white matter disease is
present. The brainstem and cerebellum are within normal limits. The
internal auditory canals are normal bilaterally.

The postcontrast images demonstrate no pathologic enhancement. A 10
mm pineal cyst is noted without significant and enhancement or solid
component.

Vascular: Flow is present in the major intracranial arteries.

Skull and upper cervical spine: The skullbase is normal. Midline
sagittal structures are otherwise unremarkable.

Sinuses/Orbits: The paranasal sinuses and mastoid air cells are
clear. The globes and orbits are within normal limits.Negative.
IMPRESSION: Negative MRI of the brain. No acute or focal lesion to explain the
patient's vertigo or dizziness.

## 2018-08-24 ENCOUNTER — Other Ambulatory Visit: Payer: Self-pay

## 2018-08-24 ENCOUNTER — Encounter: Payer: Self-pay | Admitting: Internal Medicine

## 2018-08-24 ENCOUNTER — Ambulatory Visit (INDEPENDENT_AMBULATORY_CARE_PROVIDER_SITE_OTHER): Payer: 59 | Admitting: Internal Medicine

## 2018-08-24 ENCOUNTER — Telehealth: Payer: Self-pay | Admitting: Internal Medicine

## 2018-08-24 ENCOUNTER — Telehealth: Payer: Self-pay | Admitting: *Deleted

## 2018-08-24 ENCOUNTER — Telehealth: Payer: Self-pay | Admitting: Hematology

## 2018-08-24 DIAGNOSIS — K2270 Barrett's esophagus without dysplasia: Secondary | ICD-10-CM | POA: Diagnosis not present

## 2018-08-24 DIAGNOSIS — Z20828 Contact with and (suspected) exposure to other viral communicable diseases: Secondary | ICD-10-CM | POA: Diagnosis not present

## 2018-08-24 DIAGNOSIS — K2100 Gastro-esophageal reflux disease with esophagitis, without bleeding: Secondary | ICD-10-CM

## 2018-08-24 DIAGNOSIS — D72819 Decreased white blood cell count, unspecified: Secondary | ICD-10-CM | POA: Diagnosis not present

## 2018-08-24 DIAGNOSIS — Z20822 Contact with and (suspected) exposure to covid-19: Secondary | ICD-10-CM | POA: Insufficient documentation

## 2018-08-24 NOTE — Assessment & Plan Note (Signed)
Follow cbc.  

## 2018-08-24 NOTE — Telephone Encounter (Signed)
Attempted to contact patient to schedule COVID screening. No answer.  Will attempt to reach patient tomorrow

## 2018-08-24 NOTE — Assessment & Plan Note (Signed)
Has a co-worker who was diagnosed with covid as outlined.  She presents with nausea and abdominal pain.  Discussed recommendation for her to be tested.  Also discussed at length self quarantine, etc.  She will get me the information from her work.  F/u in the next several days as outlined.  She will notify me or be evaluated if symptoms worsen or change.

## 2018-08-24 NOTE — Assessment & Plan Note (Signed)
Has a history of reflux and Barretts.  With nausea and intermittent abdominal pain as outlined.  Unclear etiology.  Will restart prilosec.  Hold on scan or ultrasound.  Consider GI f/u if not already scheduled.  A co-worker has been diagnosed with COVID.  Discussed with her regarding my desire to have her tested.  Discussed self quarantine.  She expressed understanding and stated she would comply.  Will get the information for me to seen to her work.  Will follow up beginning of next week.

## 2018-08-24 NOTE — Telephone Encounter (Signed)
Pt scheduled for Covid-19 testing at [facility] location. Orders per Dr. Einar Pheasant. Pt with direct exposure,nausea, abdominal pain, high risk. Appt secured for tomorrow 08/25/2018 at 1500. Pt requested time. Reviewed testing process, pt verbalizes understanding.

## 2018-08-24 NOTE — Progress Notes (Signed)
Patient ID: Kelly Massey, female   DOB: December 28, 1965, 53 y.o.   MRN: [id number]   Virtual Visit via video Note  This visit type was conducted due to national recommendations for restrictions regarding the COVID-19 pandemic (e.g. social distancing).  This format is felt to be most appropriate for this patient at this time.  All issues noted in this document were discussed and addressed.  No physical exam was performed (except for noted visual exam findings with Video Visits).   I connected with Kelly Massey by a video enabled telemedicine application and verified that I am speaking with the correct person using two identifiers. Location patient: home Location provider: work Persons participating in the virtual visit: patient, provider  I discussed the limitations, risks, security and privacy concerns of performing an evaluation and management service by video and the availability of in person appointments. The patient expressed understanding and agreed to proceed.  Interactive audio and video telecommunications were attempted between this provider and patient, however failed, due to patient having technical difficulties OR patient did not have access to video capability.  We continued and completed visit with audio only.   Reason for visit: acute visit.   HPI: She reports that starting 5/17 early am, she developed abdominal discomfort.  Ate pizza and initially thought was related to eating the pizza.  Has continued to have intermittent abdominal pain.  Is intermittent.  Sometimes occurs after eating and sometimes can occur even when she has not eaten anything.  Some nausea with dry heaves.  No actual vomiting.  Bowels are moving daily, but she describes - small amount.  Not on PPI now.  Has had to take previously and has had GI issues (GERD, Barrett's and dysphagia) previously.  States does not feel exactly the same as her previous episodes.  No actual acid reflux.  No burning or discomfort up in  her chest.  No fever. No cough, congestion or sob.  States a co-worker tested positive for COVID.  She is not sure of her contact, but states the machine and area she uses was shut down for cleaning.     ROS: See pertinent positives and negatives per HPI.  Past Medical History:  Diagnosis Date   Allergy    Anemia    Arthritis    knees - no meds   Asthma    Colon polyps    GERD (gastroesophageal reflux disease) 10/12/10   EGD, positive H. pylori   HSV infection    History   Hyperlipidemia    ? no meds - diet controlled   Post-operative nausea and vomiting    Seasonal allergies     Past Surgical History:  Procedure Laterality Date   ABDOMINAL HYSTERECTOMY     BALLOON DILATION N/A 07/24/2012   Procedure: BALLOON DILATION;  Surgeon: Inda Castle, MD;  Location: [facility];  Service: Endoscopy;  Laterality: N/A;   BRAVO [place] STUDY N/A 07/24/2012   Procedure: BRAVO [place] STUDY;  Surgeon: Inda Castle, MD;  Location: [facility];  Service: Endoscopy;  Laterality: N/A;   DILATION AND CURETTAGE OF UTERUS     SAB   ESOPHAGOGASTRODUODENOSCOPY N/A 07/24/2012   Procedure: ESOPHAGOGASTRODUODENOSCOPY (EGD);  Surgeon: Inda Castle, MD;  Location: [facility];  Service: Endoscopy;  Laterality: N/A;   OVARIAN CYST REMOVAL  2004   laparotomy -left   ROBOTIC ASSISTED LAPAROSCOPIC LYSIS OF ADHESION N/A 03/21/2014   Procedure: ROBOTIC ASSISTED LAPAROSCOPIC EXTENSIVE LYSIS OF ADHESION (1 Hour);  Surgeon: Bobette Mo  Garwin Brothers, MD;  Location: [facility];  Service: Gynecology;  Laterality: N/A;   ROBOTIC ASSISTED SALPINGO OOPHERECTOMY Left 03/21/2014   Procedure:  ROBOTIC ASSISTED LEFT OOPHORECTOMY;  Surgeon: Marvene Staff, MD;  Location: [facility];  Service: Gynecology;  Laterality: Left;   SHOULDER ARTHROSCOPY Left    TENNIS ELBOW RELEASE/NIRSCHEL PROCEDURE Right 05/01/2015   Procedure: RIGHT ELBOW DEBRIDEMENT AND TENDON REPAIR;  Surgeon: [name], MD;  Location: [facility];  Service: Orthopedics;  Laterality: Right;   WISDOM TOOTH EXTRACTION      Family History  Problem Relation Age of Onset   Arthritis Mother    Stroke Mother    Hypertension Mother    Heart failure Mother    Dementia Mother    Breast cancer Maternal Aunt    Prostate cancer Maternal Uncle        x 2   Diabetes Sister    Diabetes Brother        x 2   Diabetes Paternal Grandmother    Hypertension Other    Hyperlipidemia Other    Diabetes Paternal Grandfather    Lung cancer Father    Diabetes Maternal Uncle    Throat cancer Maternal Aunt        Smoker   Colon cancer Neg Hx     SOCIAL HX: reviewed.    Current Outpatient Medications:    acyclovir (ZOVIRAX) 400 MG tablet, TAKE 1 TABLET (400 MG TOTAL) BY MOUTH 2 (TWO) TIMES DAILY AS NEEDED., Disp: 60 tablet, Rfl: 1   Calcium Carbonate-Vitamin D (CALCIUM 600+D3) 600-400 MG-UNIT per tablet, Take 1 tablet by mouth daily., Disp: , Rfl:    ferrous sulfate 325 (65 FE) MG tablet, Take 325 mg by mouth daily. , Disp: , Rfl:    Multiple Vitamins-Minerals (MULTIVITAMIN WITH MINERALS) tablet, Take 1 tablet by mouth daily., Disp: , Rfl:    Saline 0.2 % SOLN, Place into the nose. Nasal saline solution daily, Disp: , Rfl:    vitamin E 400 UNIT capsule, Take 400 Units by mouth daily. Reported on 10/09/2015, Disp: , Rfl:    mometasone (NASONEX) 50 MCG/ACT nasal spray, PLACE 2 SPRAYS INTO THE NOSE DAILY. (Patient not taking: Reported on 08/24/2018), Disp: 51 g, Rfl: 3  EXAM:  GENERAL: alert.  Answering questions appropriately.  Sounds to be in no acute distress.    PSYCH/NEURO: pleasant and cooperative, no obvious depression or anxiety, speech and thought processing grossly intact  ASSESSMENT AND PLAN:  Discussed the following assessment and plan:  Barrett's esophagus without dysplasia  Gastroesophageal reflux disease with esophagitis  Leukopenia, unspecified type  Exposure to Covid-19  Virus  Barrett's esophagus Has been followed by GI.  On no medication now.  Will start prilosec as outlined.  F/u soon to reassess.  Consider GI f/u if not already scheduled.    GERD (gastroesophageal reflux disease) Has a history of reflux and Barretts.  With nausea and intermittent abdominal pain as outlined.  Unclear etiology.  Will restart prilosec.  Hold on scan or ultrasound.  Consider GI f/u if not already scheduled.  A co-worker has been diagnosed with COVID.  Discussed with her regarding my desire to have her tested.  Discussed self quarantine.  She expressed understanding and stated she would comply.  Will get the information for me to seen to her work.  Will follow up beginning of next week.    Leukopenia Follow cbc.   Exposure to Covid-19 Virus Has a co-worker who was diagnosed  with covid as outlined.  She presents with nausea and abdominal pain.  Discussed recommendation for her to be tested.  Also discussed at length self quarantine, etc.  She will get me the information from her work.  F/u in the next several days as outlined.  She will notify me or be evaluated if symptoms worsen or change.      I discussed the assessment and treatment plan with the patient. The patient was provided an opportunity to ask questions and all were answered. The patient agreed with the plan and demonstrated an understanding of the instructions.   The patient was advised to call back or seek an in-person evaluation if the symptoms worsen or if the condition fails to improve as anticipated.   I provided 25 minutes of non-face-to-face time during this encounter.   Einar Pheasant, MD

## 2018-08-24 NOTE — Progress Notes (Signed)
Attempted to call patient.  No answer.  Will try to reach patient tomorrow.

## 2018-08-24 NOTE — Telephone Encounter (Signed)
Opened in error

## 2018-08-24 NOTE — Assessment & Plan Note (Signed)
Has been followed by GI.  On no medication now.  Will start prilosec as outlined.  F/u soon to reassess.  Consider GI f/u if not already scheduled.

## 2018-08-25 ENCOUNTER — Other Ambulatory Visit: Payer: 59

## 2018-08-25 DIAGNOSIS — R6889 Other general symptoms and signs: Secondary | ICD-10-CM | POA: Diagnosis not present

## 2018-08-25 DIAGNOSIS — Z20822 Contact with and (suspected) exposure to covid-19: Secondary | ICD-10-CM

## 2018-08-27 LAB — NOVEL CORONAVIRUS, NAA: SARS-CoV-2, NAA: NOT DETECTED

## 2018-08-28 ENCOUNTER — Encounter: Payer: Self-pay | Admitting: Internal Medicine

## 2018-08-28 ENCOUNTER — Other Ambulatory Visit: Payer: Self-pay

## 2018-08-28 ENCOUNTER — Ambulatory Visit (INDEPENDENT_AMBULATORY_CARE_PROVIDER_SITE_OTHER): Payer: 59 | Admitting: Internal Medicine

## 2018-08-28 DIAGNOSIS — K2100 Gastro-esophageal reflux disease with esophagitis, without bleeding: Secondary | ICD-10-CM

## 2018-08-28 DIAGNOSIS — Z20828 Contact with and (suspected) exposure to other viral communicable diseases: Secondary | ICD-10-CM

## 2018-08-28 DIAGNOSIS — D72819 Decreased white blood cell count, unspecified: Secondary | ICD-10-CM

## 2018-08-28 DIAGNOSIS — R51 Headache: Secondary | ICD-10-CM

## 2018-08-28 DIAGNOSIS — R519 Headache, unspecified: Secondary | ICD-10-CM | POA: Insufficient documentation

## 2018-08-28 DIAGNOSIS — E7800 Pure hypercholesterolemia, unspecified: Secondary | ICD-10-CM | POA: Diagnosis not present

## 2018-08-28 DIAGNOSIS — Z20822 Contact with and (suspected) exposure to covid-19: Secondary | ICD-10-CM

## 2018-08-28 DIAGNOSIS — J069 Acute upper respiratory infection, unspecified: Secondary | ICD-10-CM

## 2018-08-28 DIAGNOSIS — K2270 Barrett's esophagus without dysplasia: Secondary | ICD-10-CM

## 2018-08-28 MED ORDER — AZITHROMYCIN 250 MG PO TABS: ORAL_TABLET | ORAL | 0 refills | Status: DC

## 2018-08-28 MED ORDER — MOMETASONE FUROATE 50 MCG/ACT NA SUSP: 2.0000 | Freq: Every day | NASAL | 3 refills | Status: DC

## 2018-08-28 NOTE — Assessment & Plan Note (Signed)
Follow cbc.  

## 2018-08-28 NOTE — Assessment & Plan Note (Signed)
Headaches as outlined.  Feels related to sinus congestion.  Treat infection.  zpak as directed.  Saline nasal spray and nasonex as directed.  Follow closely.  Call with update.

## 2018-08-28 NOTE — Assessment & Plan Note (Signed)
Has previously been followed by GI.  On prilosec now.  Abdominal discomfort has improved.  Could be contributing to her throat issues.  Will arrange f/u with GI.

## 2018-08-28 NOTE — Assessment & Plan Note (Addendum)
With increased sinus congestion, drainage and cough.  Has noticed occasional blood tinged mucus.  No gross hemoptysis.  Feels related to increased straining to cough and infection.   Increased sinus pressure and headache as outlined.  Saline nasal spray and nasonex as directed.  zpak as directed.  Call with update over the next few days.

## 2018-08-28 NOTE — Assessment & Plan Note (Signed)
Low cholesterol diet and exercise.  Follow lipid panel.   

## 2018-08-28 NOTE — Assessment & Plan Note (Signed)
Has a co-worker who was diagnosed with COVID.  She was tested.  Her COVID test was negative.  Treat current infection.  Remain out of work.  Follow closely.  Call with update.

## 2018-08-28 NOTE — Assessment & Plan Note (Signed)
Has a history of Barretts.  On prilosec now.  Abdominal discomfort has improved.  Still with throat and congestion issues as outlined.  Just started back on prilosec.  Treat infection.  Arrange f/u with GI.

## 2018-08-28 NOTE — Progress Notes (Signed)
Patient ID: Kelly Massey, female   DOB: [birth year], 53 y.o.   MRN: [id number]   Virtual Visit via video Note  This visit type was conducted due to national recommendations for restrictions regarding the COVID-19 pandemic (e.g. social distancing).  This format is felt to be most appropriate for this patient at this time.  All issues noted in this document were discussed and addressed.  No physical exam was performed (except for noted visual exam findings with Video Visits).   I connected with Reighn Kaplan by a video enabled telemedicine application or telephone and verified that I am speaking with the correct person using two identifiers. Location patient: home Location provider: home office Persons participating in the virtual visit: patient, provider  I discussed the limitations, risks, security and privacy concerns of performing an evaluation and management service by video and the availability of in person appointments.  The patient expressed understanding and agreed to proceed.   Reason for visit: scheduled follow up from an acute visit.   HPI: Was evaluated 08/24/18 for nausea and abdominal discomfort.  She had possibly been exposed to a coworker who was diagnosed with COVID.  She was started on prilosec.  Was instructed on self quarantine.  Had COVID testing - negative.  States her abdominal discomfort is better.  She is having some intermittent headaches.  Increased sinus congestion, nasal congestion and drainage.  Increased am sinus pressure.  Also some congestion in her throat and she has to clear her throat in the am.  Cough in am - mostly.  Describes some gagging, then will have mucus production.  Some color.  Occasional blood tinge.  No gross hemoptysis.  No fever.  Bowels stable.  No vomiting.  Bowels are moving.    ROS: See pertinent positives and negatives per HPI.  Past Medical History:  Diagnosis Date  . Allergy   . Anemia   . Arthritis    knees - no meds  . Asthma   .  Colon polyps   . GERD (gastroesophageal reflux disease) 10/12/10   EGD, positive H. pylori  . HSV infection    History  . Hyperlipidemia    ? no meds - diet controlled  . Post-operative nausea and vomiting   . Seasonal allergies     Past Surgical History:  Procedure Laterality Date  . ABDOMINAL HYSTERECTOMY    . BALLOON DILATION N/A 07/24/2012   Procedure: BALLOON DILATION;  Surgeon: Inda Castle, MD;  Location: [facility];  Service: Endoscopy;  Laterality: N/A;  . BRAVO [place] STUDY N/A 07/24/2012   Procedure: BRAVO [place] STUDY;  Surgeon: Inda Castle, MD;  Location: [facility];  Service: Endoscopy;  Laterality: N/A;  . DILATION AND CURETTAGE OF UTERUS     SAB  . ESOPHAGOGASTRODUODENOSCOPY N/A 07/24/2012   Procedure: ESOPHAGOGASTRODUODENOSCOPY (EGD);  Surgeon: Inda Castle, MD;  Location: [facility];  Service: Endoscopy;  Laterality: N/A;  . OVARIAN CYST REMOVAL  2004   laparotomy -left  . ROBOTIC ASSISTED LAPAROSCOPIC LYSIS OF ADHESION N/A 03/21/2014   Procedure: ROBOTIC ASSISTED LAPAROSCOPIC EXTENSIVE LYSIS OF ADHESION (1 Hour);  Surgeon: Marvene Staff, MD;  Location: [facility];  Service: Gynecology;  Laterality: N/A;  . ROBOTIC ASSISTED SALPINGO OOPHERECTOMY Left 03/21/2014   Procedure:  ROBOTIC ASSISTED LEFT OOPHORECTOMY;  Surgeon: Marvene Staff, MD;  Location: [facility];  Service: Gynecology;  Laterality: Left;  . SHOULDER ARTHROSCOPY Left   . TENNIS ELBOW RELEASE/NIRSCHEL PROCEDURE Right 05/01/2015   Procedure: RIGHT ELBOW  DEBRIDEMENT AND TENDON REPAIR;  Surgeon: [name], MD;  Location: [facility];  Service: Orthopedics;  Laterality: Right;  . WISDOM TOOTH EXTRACTION      Family History  Problem Relation Age of Onset  . Arthritis Mother   . Stroke Mother   . Hypertension Mother   . Heart failure Mother   . Dementia Mother   . Breast cancer Maternal Aunt   . Prostate cancer Maternal Uncle        x 2  . Diabetes Sister   . Diabetes  Brother        x 2  . Diabetes Paternal Grandmother   . Hypertension Other   . Hyperlipidemia Other   . Diabetes Paternal Grandfather   . Lung cancer Father   . Diabetes Maternal Uncle   . Throat cancer Maternal Aunt        Smoker  . Colon cancer Neg Hx     SOCIAL HX: reviewed.    Current Outpatient Medications:  .  acyclovir (ZOVIRAX) 400 MG tablet, TAKE 1 TABLET (400 MG TOTAL) BY MOUTH 2 (TWO) TIMES DAILY AS NEEDED., Disp: 60 tablet, Rfl: 1 .  Calcium Carbonate-Vitamin D (CALCIUM 600+D3) 600-400 MG-UNIT per tablet, Take 1 tablet by mouth daily., Disp: , Rfl:  .  ferrous sulfate 325 (65 FE) MG tablet, Take 325 mg by mouth daily. , Disp: , Rfl:  .  mometasone (NASONEX) 50 MCG/ACT nasal spray, Place 2 sprays into the nose daily., Disp: 51 g, Rfl: 3 .  Multiple Vitamins-Minerals (MULTIVITAMIN WITH MINERALS) tablet, Take 1 tablet by mouth daily., Disp: , Rfl:  .  Saline 0.2 % SOLN, Place into the nose. Nasal saline solution daily, Disp: , Rfl:  .  vitamin E 400 UNIT capsule, Take 400 Units by mouth daily. Reported on 10/09/2015, Disp: , Rfl:  .  azithromycin (ZITHROMAX) 250 MG tablet, Take two tablets x 1 day and then one tablet per day for four more days., Disp: 6 tablet, Rfl: 0  EXAM:  GENERAL: alert, oriented, appears well and in no acute distress  HEENT: atraumatic, conjunttiva clear, no obvious abnormalities on inspection of external nose and ears  NECK: normal movements of the head and neck  LUNGS: on inspection no signs of respiratory distress, breathing rate appears normal, no obvious gross SOB, gasping or wheezing  CV: no obvious cyanosis  PSYCH/NEURO: pleasant and cooperative, no obvious depression or anxiety, speech and thought processing grossly intact  ASSESSMENT AND PLAN:  Discussed the following assessment and plan:  Barrett's esophagus without dysplasia  Exposure to Covid-19 Virus  Gastroesophageal reflux disease with esophagitis  Hypercholesterolemia   Leukopenia, unspecified type  Upper respiratory tract infection, unspecified type  Nonintractable headache, unspecified chronicity pattern, unspecified headache type  Barrett's esophagus Has previously been followed by GI.  On prilosec now.  Abdominal discomfort has improved.  Could be contributing to her throat issues.  Will arrange f/u with GI.   Exposure to Covid-19 Virus Has a co-worker who was diagnosed with COVID.  She was tested.  Her COVID test was negative.  Treat current infection.  Remain out of work.  Follow closely.  Call with update.    GERD (gastroesophageal reflux disease) Has a history of Barretts.  On prilosec now.  Abdominal discomfort has improved.  Still with throat and congestion issues as outlined.  Just started back on prilosec.  Treat infection.  Arrange f/u with GI.   Hypercholesterolemia Low cholesterol diet and exercise.  Follow  lipid panel.   Leukopenia Follow cbc.   URI (upper respiratory infection) With increased sinus congestion, drainage and cough.  Has noticed occasional blood tinged mucus.  No gross hemoptysis.  Feels related to increased straining to cough and infection.   Increased sinus pressure and headache as outlined.  Saline nasal spray and nasonex as directed.  zpak as directed.  Call with update over the next few days.    Headache Headaches as outlined.  Feels related to sinus congestion.  Treat infection.  zpak as directed.  Saline nasal spray and nasonex as directed.  Follow closely.  Call with update.      I discussed the assessment and treatment plan with the patient. The patient was provided an opportunity to ask questions and all were answered. The patient agreed with the plan and demonstrated an understanding of the instructions.   The patient was advised to call back or seek an in-person evaluation if the symptoms worsen or if the condition fails to improve as anticipated.    Einar Pheasant, MD

## 2018-09-01 ENCOUNTER — Telehealth: Payer: Self-pay | Admitting: Internal Medicine

## 2018-09-01 NOTE — Telephone Encounter (Signed)
I am glad she is doing better.  I would like to extend out her work note.  I originally had her scheduled to go back on Monday.  Given persistent cough, congestion, I would like to extend this out.  Can do a new work note if needed to have her return to work on Wednesday 09/06/18.  Call Monday with update.  If needs appt can schedule.

## 2018-09-01 NOTE — Telephone Encounter (Signed)
No call made - pt was negative for COVID19

## 2018-09-01 NOTE — Telephone Encounter (Signed)
Kelly Massey called and says Dr. Nicki Reaper wanted a follow up call from her last visit. She says she took the last pill from the Z-Pack today and her stomach is hurting a little, which is probably from the pills. She says she's been eating Activia yogurt every day with the pills. She says she's using the nasal spray. She says the chest congestion is better, but she can still feel it in her chest when she coughs. She says she's not coughing as much, maybe a couple of times this week. She says just let Dr. Nicki Reaper know this information. I advised I will send to Dr. Nicki Reaper.

## 2018-09-04 NOTE — Telephone Encounter (Signed)
Noted.  Will complete forms when receive.

## 2018-09-04 NOTE — Telephone Encounter (Signed)
Patient stated that she did return to work this morning. She is feeling better. She stated that she is still coughing every now and then in the morning but subsides through out the day. Completed z pak last week. Advised that you were wanting to extend work note. Pt stated she has felt fine being at work today and does not have any other symptoms. They are having her fill out FMLA paperwork for the time that she missed from work. She is going to have her husband drop them off tomorrow to be completed.

## 2018-09-05 ENCOUNTER — Telehealth: Payer: Self-pay | Admitting: Internal Medicine

## 2018-09-05 NOTE — Telephone Encounter (Signed)
Pt dropped off FMLA paperwork to be filled out. Paperwork is in color folder up front. Thank you!

## 2018-09-05 NOTE — Telephone Encounter (Signed)
Forms placed in forms folder

## 2018-09-05 NOTE — Telephone Encounter (Signed)
See other note

## 2018-09-05 NOTE — Telephone Encounter (Signed)
Form completed.  Placed in box.   

## 2018-09-05 NOTE — Telephone Encounter (Signed)
Faxed. Left message for patient

## 2018-09-28 ENCOUNTER — Ambulatory Visit
Admission: RE | Admit: 2018-09-28 | Discharge: 2018-09-28 | Disposition: A | Discharge status: Home / Self Care | Payer: 59 | Source: Ambulatory Visit | Attending: Internal Medicine | Admitting: Internal Medicine

## 2018-09-28 DIAGNOSIS — Z1239 Encounter for other screening for malignant neoplasm of breast: Secondary | ICD-10-CM

## 2018-10-10 ENCOUNTER — Other Ambulatory Visit: Payer: Self-pay | Admitting: Internal Medicine

## 2018-11-14 ENCOUNTER — Other Ambulatory Visit: Payer: Self-pay

## 2018-11-14 ENCOUNTER — Ambulatory Visit (INDEPENDENT_AMBULATORY_CARE_PROVIDER_SITE_OTHER): Payer: 59 | Admitting: Internal Medicine

## 2018-11-14 ENCOUNTER — Encounter: Payer: Self-pay | Admitting: Internal Medicine

## 2018-11-14 DIAGNOSIS — Z9109 Other allergy status, other than to drugs and biological substances: Secondary | ICD-10-CM | POA: Diagnosis not present

## 2018-11-14 DIAGNOSIS — K2100 Gastro-esophageal reflux disease with esophagitis, without bleeding: Secondary | ICD-10-CM

## 2018-11-14 DIAGNOSIS — E7800 Pure hypercholesterolemia, unspecified: Secondary | ICD-10-CM

## 2018-11-14 DIAGNOSIS — D72819 Decreased white blood cell count, unspecified: Secondary | ICD-10-CM

## 2018-11-14 DIAGNOSIS — D708 Other neutropenia: Secondary | ICD-10-CM

## 2018-11-14 DIAGNOSIS — K2270 Barrett's esophagus without dysplasia: Secondary | ICD-10-CM

## 2018-11-14 NOTE — Progress Notes (Signed)
Patient ID: Kelly Massey, female   DOB: 03/03/1966, 53 y.o.   MRN: [id number]   Virtual Visit via telephone Note  This visit type was conducted due to national recommendations for restrictions regarding the COVID-19 pandemic (e.g. social distancing).  This format is felt to be most appropriate for this patient at this time.  All issues noted in this document were discussed and addressed.  No physical exam was performed (except for noted visual exam findings with Video Visits).   I connected with  Kelly Massey by telephone and verified that I am speaking with the correct person using two identifiers. Location patient: home Location provider: work Persons participating in the telephone visit: patient, provider  I discussed the limitations, risks, security and privacy concerns of performing an evaluation and management service by telephone and the availability of in person appointments. The patient expressed understanding and agreed to proceed.   Reason for visit:   HPI: She reports she is doing relatively well.  Working.  Handling stress.  Trying to stay active. No chest pain.  No sob.  No fever. No sinus pressure.  Some am cough, but states once she clears her throat, no further cough.  No wheezing.  Taking prilosec.  Still issues with acid.  Better on prilosec.  Overdue GI evaluation.   Discussed the need for GI evaluation.  Someone at her work recently diagnosed with covid.  She report she does not work near the person diagnosed. They work in another building and another shift.     ROS: See pertinent positives and negatives per HPI.  Past Medical History:  Diagnosis Date  . Allergy   . Anemia   . Arthritis    knees - no meds  . Asthma   . Colon polyps   . GERD (gastroesophageal reflux disease) 10/12/10   EGD, positive H. pylori  . HSV infection    History  . Hyperlipidemia    ? no meds - diet controlled  . Post-operative nausea and vomiting   . Seasonal allergies     Past  Surgical History:  Procedure Laterality Date  . ABDOMINAL HYSTERECTOMY    . BALLOON DILATION N/A 07/24/2012   Procedure: BALLOON DILATION;  Surgeon: Inda Castle, MD;  Location: [facility];  Service: Endoscopy;  Laterality: N/A;  . BRAVO [place] STUDY N/A 07/24/2012   Procedure: BRAVO [place] STUDY;  Surgeon: Inda Castle, MD;  Location: [facility];  Service: Endoscopy;  Laterality: N/A;  . DILATION AND CURETTAGE OF UTERUS     SAB  . ESOPHAGOGASTRODUODENOSCOPY N/A 07/24/2012   Procedure: ESOPHAGOGASTRODUODENOSCOPY (EGD);  Surgeon: Inda Castle, MD;  Location: [facility];  Service: Endoscopy;  Laterality: N/A;  . OVARIAN CYST REMOVAL  2004   laparotomy -left  . ROBOTIC ASSISTED LAPAROSCOPIC LYSIS OF ADHESION N/A 03/21/2014   Procedure: ROBOTIC ASSISTED LAPAROSCOPIC EXTENSIVE LYSIS OF ADHESION (1 Hour);  Surgeon: Marvene Staff, MD;  Location: [facility];  Service: Gynecology;  Laterality: N/A;  . ROBOTIC ASSISTED SALPINGO OOPHERECTOMY Left 03/21/2014   Procedure:  ROBOTIC ASSISTED LEFT OOPHORECTOMY;  Surgeon: Marvene Staff, MD;  Location: [facility];  Service: Gynecology;  Laterality: Left;  . SHOULDER ARTHROSCOPY Left   . TENNIS ELBOW RELEASE/NIRSCHEL PROCEDURE Right 05/01/2015   Procedure: RIGHT ELBOW DEBRIDEMENT AND TENDON REPAIR;  Surgeon: [name], MD;  Location: [facility];  Service: Orthopedics;  Laterality: Right;  . WISDOM TOOTH EXTRACTION      Family History  Problem Relation Age  of Onset  . Arthritis Mother   . Stroke Mother   . Hypertension Mother   . Heart failure Mother   . Dementia Mother   . Breast cancer Maternal Aunt   . Prostate cancer Maternal Uncle        x 2  . Diabetes Sister   . Diabetes Brother        x 2  . Diabetes Paternal Grandmother   . Hypertension Other   . Hyperlipidemia Other   . Diabetes Paternal Grandfather   . Lung cancer Father   . Diabetes Maternal Uncle   . Throat cancer Maternal Aunt        Smoker  . Colon  cancer Neg Hx     SOCIAL HX: reviewed.    Current Outpatient Medications:  .  acyclovir (ZOVIRAX) 400 MG tablet, TAKE 1 TABLET (400 MG TOTAL) BY MOUTH 2 (TWO) TIMES DAILY AS NEEDED., Disp: 60 tablet, Rfl: 1 .  azithromycin (ZITHROMAX) 250 MG tablet, Take two tablets x 1 day and then one tablet per day for four more days., Disp: 6 tablet, Rfl: 0 .  Calcium Carbonate-Vitamin D (CALCIUM 600+D3) 600-400 MG-UNIT per tablet, Take 1 tablet by mouth daily., Disp: , Rfl:  .  ferrous sulfate 325 (65 FE) MG tablet, Take 325 mg by mouth daily. , Disp: , Rfl:  .  mometasone (NASONEX) 50 MCG/ACT nasal spray, Place 2 sprays into the nose daily., Disp: 51 g, Rfl: 3 .  Multiple Vitamins-Minerals (MULTIVITAMIN WITH MINERALS) tablet, Take 1 tablet by mouth daily., Disp: , Rfl:  .  Saline 0.2 % SOLN, Place into the nose. Nasal saline solution daily, Disp: , Rfl:  .  vitamin E 400 UNIT capsule, Take 400 Units by mouth daily. Reported on 10/09/2015, Disp: , Rfl:   EXAM:  GENERAL: alert.  Sounds to be in no acute distress.  Answering questions appropriately.    PSYCH/NEURO: pleasant and cooperative, no obvious depression or anxiety, speech and thought processing grossly intact  ASSESSMENT AND PLAN:  Discussed the following assessment and plan:  Barrett's esophagus Has previously been followed by GI.  On prilosec.  Overdue f/u with GI.  Refer back.    Environmental allergies Discussed treatment.  Some am cough.  Resolves after throat clears and has not persistent cough throughout the day.  On prilosec.  Has improved.  Follow.    GERD (gastroesophageal reflux disease) Doing better on prilosec.    Hypercholesterolemia Low cholesterol diet and exercise.  Follow lipid panel.    Leukopenia Follow cbc.   Other neutropenia ([place]) Previously worked up by hematology.  Follow cbc.     I discussed the assessment and treatment plan with the patient. The patient was provided an opportunity to ask questions and  all were answered. The patient agreed with the plan and demonstrated an understanding of the instructions.   The patient was advised to call back or seek an in-person evaluation if the symptoms worsen or if the condition fails to improve as anticipated.  I provided 20 minutes of non-face-to-face time during this encounter.   Einar Pheasant, MD

## 2018-11-17 ENCOUNTER — Other Ambulatory Visit: Payer: Self-pay

## 2018-11-17 ENCOUNTER — Telehealth: Payer: Self-pay | Admitting: *Deleted

## 2018-11-17 ENCOUNTER — Ambulatory Visit (INDEPENDENT_AMBULATORY_CARE_PROVIDER_SITE_OTHER): Payer: 59 | Admitting: Internal Medicine

## 2018-11-17 DIAGNOSIS — K2270 Barrett's esophagus without dysplasia: Secondary | ICD-10-CM | POA: Diagnosis not present

## 2018-11-17 DIAGNOSIS — Z20828 Contact with and (suspected) exposure to other viral communicable diseases: Secondary | ICD-10-CM | POA: Diagnosis not present

## 2018-11-17 DIAGNOSIS — Z20822 Contact with and (suspected) exposure to covid-19: Secondary | ICD-10-CM

## 2018-11-17 NOTE — Telephone Encounter (Signed)
Patient has been exposed to [place]. We discussed this at her appt earlier in the week. She would like to be tested

## 2018-11-17 NOTE — Telephone Encounter (Signed)
I do not mind ordering the test, but need to clarify her information.  When I talked to her previously, she had informed me that she was not in the same building, etc at the previous coworker.  Is this a new exposure.  Also , she will need to quarantine, etc. Pending symptoms and issue, may need doxy visit.  Ok to place order for her to be tested, but needs the information about self quarantine.

## 2018-11-17 NOTE — Telephone Encounter (Signed)
Copied from [place] [phone]. Topic: General - Other >> Nov 17, 2018  8:31 AM Rainey Pines A wrote: Patient would like a callback in regards to getting tested for COVID. Since her app on Tuesday she has learned that her coworker who she worked very closely with is very sick.

## 2018-11-17 NOTE — Telephone Encounter (Signed)
Patient is having little scratchy throat , her co-worker tested positive for COVID today , wouldlike to be tested .

## 2018-11-17 NOTE — Telephone Encounter (Signed)
Since messages are conflicting, I would like to schedule an appt to discuss with pt her symptoms, etc.  I would recommend going ahead and sending her for covid testing.  Please order and she can go today if possible.  She will need to self quarantine.  I can work her in at 4:30 today. (doxy)

## 2018-11-17 NOTE — Telephone Encounter (Signed)
Spoke with patient. 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 They tested him because He had to go to the hospital for fever and acute swelling in his leg and is now possibly being admitted. She stated that she is not feeling any different than earlier in the week. She says she does not feel like she needs to be tested but will if you would like. Pt did not say if she was quarantining or if she was still working.

## 2018-11-17 NOTE — Telephone Encounter (Signed)
Patient scheduled for 4:30 today. Advised that she needs to self quarantine. She is at work. Will not be able to be tested today because she cannot leave work. Advised that she will need to go first thing Monday morning.

## 2018-11-17 NOTE — Telephone Encounter (Signed)
Pt had telephone appt 11/17/18.

## 2018-11-17 NOTE — Progress Notes (Signed)
Patient ID: Kelly Massey, female   DOB: 28-Jun-1965, 53 y.o.   MRN: [id number]   Virtual Visit via telephone Note  This visit type was conducted due to national recommendations for restrictions regarding the COVID-19 pandemic (e.g. social distancing).  This format is felt to be most appropriate for this patient at this time.  All issues noted in this document were discussed and addressed.  No physical exam was performed (except for noted visual exam findings with Video Visits).   I connected with Pandora Leiter by telephone and verified that I am speaking with the correct person using two identifiers. Location patient: home Location provider: work  Persons participating in the virtual visit: patient, provider  I discussed the limitations, risks, security and privacy concerns of performing an evaluation and management service by telephone and the availability of in person appointments. The patient expressed understanding and agreed to proceed.   Reason for visit: work in appt  HPI: She reports that the man that she works directly with at work is out sick.  Was tested for covid.  She just found out his test was negative.  He does have fever and some leg swelling and problems with his legs.  He is being admitted for further treatment/care.  There were a couple of other co workers diagnosed with covid, but they did not work in her building and did not work her shift.  She denies fever.  Some minimal throat congestion in am.  Coughs and it resolves.  No further problems throughout the day.  No chest congestion. No sob.  No chest pain or tightness.  No acid reflux.  No abdominal pain.  Bowels moving.     ROS: See pertinent positives and negatives per HPI.  Past Medical History:  Diagnosis Date  . Allergy   . Anemia   . Arthritis    knees - no meds  . Asthma   . Colon polyps   . GERD (gastroesophageal reflux disease) 10/12/10   EGD, positive H. pylori  . HSV infection    History  .  Hyperlipidemia    ? no meds - diet controlled  . Post-operative nausea and vomiting   . Seasonal allergies     Past Surgical History:  Procedure Laterality Date  . ABDOMINAL HYSTERECTOMY    . BALLOON DILATION N/A 07/24/2012   Procedure: BALLOON DILATION;  Surgeon: Inda Castle, MD;  Location: [facility];  Service: Endoscopy;  Laterality: N/A;  . BRAVO [place] STUDY N/A 07/24/2012   Procedure: BRAVO [place] STUDY;  Surgeon: Inda Castle, MD;  Location: [facility];  Service: Endoscopy;  Laterality: N/A;  . DILATION AND CURETTAGE OF UTERUS     SAB  . ESOPHAGOGASTRODUODENOSCOPY N/A 07/24/2012   Procedure: ESOPHAGOGASTRODUODENOSCOPY (EGD);  Surgeon: Inda Castle, MD;  Location: [facility];  Service: Endoscopy;  Laterality: N/A;  . OVARIAN CYST REMOVAL  2004   laparotomy -left  . ROBOTIC ASSISTED LAPAROSCOPIC LYSIS OF ADHESION N/A 03/21/2014   Procedure: ROBOTIC ASSISTED LAPAROSCOPIC EXTENSIVE LYSIS OF ADHESION (1 Hour);  Surgeon: Marvene Staff, MD;  Location: [facility];  Service: Gynecology;  Laterality: N/A;  . ROBOTIC ASSISTED SALPINGO OOPHERECTOMY Left 03/21/2014   Procedure:  ROBOTIC ASSISTED LEFT OOPHORECTOMY;  Surgeon: Marvene Staff, MD;  Location: [facility];  Service: Gynecology;  Laterality: Left;  . SHOULDER ARTHROSCOPY Left   . TENNIS ELBOW RELEASE/NIRSCHEL PROCEDURE Right 05/01/2015   Procedure: RIGHT ELBOW DEBRIDEMENT AND TENDON REPAIR;  Surgeon: [name], MD;  Location: [facility];  Service: Orthopedics;  Laterality: Right;  . WISDOM TOOTH EXTRACTION      Family History  Problem Relation Age of Onset  . Arthritis Mother   . Stroke Mother   . Hypertension Mother   . Heart failure Mother   . Dementia Mother   . Breast cancer Maternal Aunt   . Prostate cancer Maternal Uncle        x 2  . Diabetes Sister   . Diabetes Brother        x 2  . Diabetes Paternal Grandmother   . Hypertension Other   . Hyperlipidemia Other   . Diabetes Paternal  Grandfather   . Lung cancer Father   . Diabetes Maternal Uncle   . Throat cancer Maternal Aunt        Smoker  . Colon cancer Neg Hx     SOCIAL HX: reviewed.    Current Outpatient Medications:  .  acyclovir (ZOVIRAX) 400 MG tablet, TAKE 1 TABLET (400 MG TOTAL) BY MOUTH 2 (TWO) TIMES DAILY AS NEEDED., Disp: 60 tablet, Rfl: 1 .  azithromycin (ZITHROMAX) 250 MG tablet, Take two tablets x 1 day and then one tablet per day for four more days., Disp: 6 tablet, Rfl: 0 .  Calcium Carbonate-Vitamin D (CALCIUM 600+D3) 600-400 MG-UNIT per tablet, Take 1 tablet by mouth daily., Disp: , Rfl:  .  ferrous sulfate 325 (65 FE) MG tablet, Take 325 mg by mouth daily. , Disp: , Rfl:  .  mometasone (NASONEX) 50 MCG/ACT nasal spray, Place 2 sprays into the nose daily., Disp: 51 g, Rfl: 3 .  Multiple Vitamins-Minerals (MULTIVITAMIN WITH MINERALS) tablet, Take 1 tablet by mouth daily., Disp: , Rfl:  .  Saline 0.2 % SOLN, Place into the nose. Nasal saline solution daily, Disp: , Rfl:  .  vitamin E 400 UNIT capsule, Take 400 Units by mouth daily. Reported on 10/09/2015, Disp: , Rfl:   EXAM:  GENERAL: alert.  Sounds to be in no acute distress. Answering qeustions appropriately.    PSYCH/NEURO: pleasant and cooperative, no obvious depression or anxiety, speech and thought processing grossly intact  ASSESSMENT AND PLAN:  Discussed the following assessment and plan:  Barrett's esophagus Has previously been followed by GI.  On prilosec.  Some congestion in am, but clears and has no further problems throughout the day.  Continue f/u with GI.  Overdue.    Exposure to Covid-19 Virus Co-worker being admitted.  Reports test today negative, but he is being admitted for fever, etc.  Discussed the possibility of false negative.  Discussed the need to test her.  Some throat congestion, but no fever.  No chest congestion or sob.  No abdominal pain or diarrhea.  Discussed the need for her to be tested.  Also discussed self  quarantine.  Follow.     I discussed the assessment and treatment plan with the patient. The patient was provided an opportunity to ask questions and all were answered. The patient agreed with the plan and demonstrated an understanding of the instructions.   The patient was advised to call back or seek an in-person evaluation if the symptoms worsen or if the condition fails to improve as anticipated.  I provided 13 minutes of non-face-to-face time during this encounter.   Einar Pheasant, MD

## 2018-11-19 ENCOUNTER — Encounter: Payer: Self-pay | Admitting: Internal Medicine

## 2018-11-19 NOTE — Assessment & Plan Note (Signed)
Low cholesterol diet and exercise.  Follow lipid panel.   

## 2018-11-19 NOTE — Assessment & Plan Note (Signed)
Co-worker being admitted.  Reports test today negative, but he is being admitted for fever, etc.  Discussed the possibility of false negative.  Discussed the need to test her.  Some throat congestion, but no fever.  No chest congestion or sob.  No abdominal pain or diarrhea.  Discussed the need for her to be tested.  Also discussed self quarantine.  Follow.

## 2018-11-19 NOTE — Assessment & Plan Note (Signed)
Doing better on prilosec.

## 2018-11-19 NOTE — Assessment & Plan Note (Signed)
Discussed treatment.  Some am cough.  Resolves after throat clears and has not persistent cough throughout the day.  On prilosec.  Has improved.  Follow.

## 2018-11-19 NOTE — Assessment & Plan Note (Signed)
Has previously been followed by GI.  On prilosec.  Overdue f/u with GI.  Refer back.

## 2018-11-19 NOTE — Assessment & Plan Note (Signed)
Previously worked up by hematology.  Follow cbc.  

## 2018-11-19 NOTE — Assessment & Plan Note (Signed)
Has previously been followed by GI.  On prilosec.  Some congestion in am, but clears and has no further problems throughout the day.  Continue f/u with GI.  Overdue.

## 2018-11-19 NOTE — Assessment & Plan Note (Signed)
Follow cbc.  

## 2018-11-20 ENCOUNTER — Other Ambulatory Visit: Payer: Self-pay

## 2018-11-20 DIAGNOSIS — Z20822 Contact with and (suspected) exposure to covid-19: Secondary | ICD-10-CM

## 2018-11-21 LAB — NOVEL CORONAVIRUS, NAA: SARS-CoV-2, NAA: NOT DETECTED

## 2019-01-26 ENCOUNTER — Encounter (HOSPITAL_COMMUNITY): Payer: Self-pay

## 2019-01-26 ENCOUNTER — Ambulatory Visit (HOSPITAL_COMMUNITY)
Admission: EM | Admit: 2019-01-26 | Discharge: 2019-01-26 | Disposition: A | Discharge status: Home / Self Care | Payer: 59 | Attending: Emergency Medicine | Admitting: Emergency Medicine

## 2019-01-26 ENCOUNTER — Ambulatory Visit: Payer: 59 | Admitting: Family Medicine

## 2019-01-26 ENCOUNTER — Other Ambulatory Visit: Payer: Self-pay

## 2019-01-26 DIAGNOSIS — M5442 Lumbago with sciatica, left side: Secondary | ICD-10-CM | POA: Diagnosis not present

## 2019-01-26 DIAGNOSIS — R030 Elevated blood-pressure reading, without diagnosis of hypertension: Secondary | ICD-10-CM | POA: Diagnosis not present

## 2019-01-26 MED ORDER — CYCLOBENZAPRINE HCL 10 MG PO TABS: 10.0000 mg | ORAL_TABLET | Freq: Two times a day (BID) | ORAL | 0 refills | Status: DC | PRN

## 2019-01-26 NOTE — Discharge Instructions (Addendum)
Take the prescribed muscle relaxer Flexeril as directed.  Do not drive, operate machinery, or drink alcohol with this medication as it may cause drowsiness.    Follow-up with your primary care provider if your pain persist or worsens.  Your blood pressure is elevated today at 152/76.  Please have this rechecked by your primary care provider in 2-4 weeks.

## 2019-01-26 NOTE — ED Provider Notes (Signed)
[place]    CSN: [id number] Arrival date & time: 01/26/19  1129      History   Chief Complaint Chief Complaint  Patient presents with  . Appointment  . (1:30) Back & Hip Pain    HPI NORISSA SEEDS is a 53 y.o. female.   Patient presents with lower back pain radiating to her left hip and down her her posterior thigh.  She denies fall or injury; she states the pain began while she was bending over to pick up something.  She denies weakness or numbness in LE.  Treatment attempted at home by taking a friend's muscle relaxer and using Aspercreme and a Salonpas patch.  Patient has history of arthritis and bursitis.  The history is provided by the patient.    Past Medical History:  Diagnosis Date  . Allergy   . Anemia   . Arthritis    knees - no meds  . Asthma   . Colon polyps   . GERD (gastroesophageal reflux disease) 10/12/10   EGD, positive H. pylori  . HSV infection    History  . Hyperlipidemia    ? no meds - diet controlled  . Post-operative nausea and vomiting   . Seasonal allergies     Patient Active Problem List   Diagnosis Date Noted  . Headache 08/28/2018  . Exposure to COVID-19 virus 08/24/2018  . Chest pain 10/30/2017  . Witnessed apneic spells 10/31/2016  . Other neutropenia ([place]) 08/10/2016  . Vertigo 07/28/2016  . Health care maintenance 11/21/2014  . History of colonic polyps 11/21/2014  . Dysphagia 11/19/2014  . Trochanteric bursitis of left hip 10/08/2014  . URI (upper respiratory infection) 02/10/2014  . Pelvic pain in female 12/25/2013  . Personal history of ovarian cyst 12/25/2013  . Change in bowel movement 11/11/2013  . breast tenderness 09/29/2013  . Dysphagia, unspecified(787.20) 06/12/2012  . Barrett's esophagus 06/12/2012  . Environmental allergies 02/13/2012  . Enlarged thyroid 02/13/2012  . GERD (gastroesophageal reflux disease) 02/08/2012  . Leukopenia 02/08/2012  . Fatigue 02/08/2012  . Hypercholesterolemia  02/08/2012    Past Surgical History:  Procedure Laterality Date  . ABDOMINAL HYSTERECTOMY    . BALLOON DILATION N/A 07/24/2012   Procedure: BALLOON DILATION;  Surgeon: Inda Castle, MD;  Location: [facility];  Service: Endoscopy;  Laterality: N/A;  . BRAVO [place] STUDY N/A 07/24/2012   Procedure: BRAVO [place] STUDY;  Surgeon: Inda Castle, MD;  Location: [facility];  Service: Endoscopy;  Laterality: N/A;  . DILATION AND CURETTAGE OF UTERUS     SAB  . ESOPHAGOGASTRODUODENOSCOPY N/A 07/24/2012   Procedure: ESOPHAGOGASTRODUODENOSCOPY (EGD);  Surgeon: Inda Castle, MD;  Location: [facility];  Service: Endoscopy;  Laterality: N/A;  . OVARIAN CYST REMOVAL  2004   laparotomy -left  . ROBOTIC ASSISTED LAPAROSCOPIC LYSIS OF ADHESION N/A 03/21/2014   Procedure: ROBOTIC ASSISTED LAPAROSCOPIC EXTENSIVE LYSIS OF ADHESION (1 Hour);  Surgeon: Marvene Staff, MD;  Location: [facility];  Service: Gynecology;  Laterality: N/A;  . ROBOTIC ASSISTED SALPINGO OOPHERECTOMY Left 03/21/2014   Procedure:  ROBOTIC ASSISTED LEFT OOPHORECTOMY;  Surgeon: Marvene Staff, MD;  Location: [facility];  Service: Gynecology;  Laterality: Left;  . SHOULDER ARTHROSCOPY Left   . TENNIS ELBOW RELEASE/NIRSCHEL PROCEDURE Right 05/01/2015   Procedure: RIGHT ELBOW DEBRIDEMENT AND TENDON REPAIR;  Surgeon: [name], MD;  Location: [facility];  Service: Orthopedics;  Laterality: Right;  . WISDOM TOOTH EXTRACTION  OB History   No obstetric history on file.      Home Medications    Prior to Admission medications   Medication Sig Start Date End Date Taking? Authorizing Provider  acyclovir (ZOVIRAX) 400 MG tablet TAKE 1 TABLET (400 MG TOTAL) BY MOUTH 2 (TWO) TIMES DAILY AS NEEDED. 10/10/18   Einar Pheasant, MD  azithromycin (ZITHROMAX) 250 MG tablet Take two tablets x 1 day and then one tablet per day for four more days. 08/28/18   Einar Pheasant, MD  Calcium Carbonate-Vitamin D (CALCIUM 600+D3)  600-400 MG-UNIT per tablet Take 1 tablet by mouth daily.    [provider]  cyclobenzaprine (FLEXERIL) 10 MG tablet Take 1 tablet (10 mg total) by mouth 2 (two) times daily as needed for muscle spasms. 01/26/19   Sharion Balloon, NP  ferrous sulfate 325 (65 FE) MG tablet Take 325 mg by mouth daily.     [provider]  mometasone (NASONEX) 50 MCG/ACT nasal spray Place 2 sprays into the nose daily. 08/28/18   Einar Pheasant, MD  Multiple Vitamins-Minerals (MULTIVITAMIN WITH MINERALS) tablet Take 1 tablet by mouth daily.    [provider]  Saline 0.2 % SOLN Place into the nose. Nasal saline solution daily    [provider]  vitamin E 400 UNIT capsule Take 400 Units by mouth daily. Reported on 10/09/2015    [provider]    Family History Family History  Problem Relation Age of Onset  . Arthritis Mother   . Stroke Mother   . Hypertension Mother   . Heart failure Mother   . Dementia Mother   . Breast cancer Maternal Aunt   . Prostate cancer Maternal Uncle        x 2  . Diabetes Sister   . Diabetes Brother        x 2  . Diabetes Paternal Grandmother   . Hypertension Other   . Hyperlipidemia Other   . Diabetes Paternal Grandfather   . Lung cancer Father   . Diabetes Maternal Uncle   . Throat cancer Maternal Aunt        Smoker  . Colon cancer Neg Hx     Social History Social History   Tobacco Use  . Smoking status: Never Smoker  . Smokeless tobacco: Never Used  Substance Use Topics  . Alcohol use: No    Alcohol/week: 0.0 standard drinks  . Drug use: No     Allergies   Adhesive [tape], Aspirin, Ibuprofen, Penicillins, Nsaids, Oxycodone, and Shrimp [shellfish allergy]   Review of Systems Review of Systems  Constitutional: Negative for chills and fever.  HENT: Negative for ear pain and sore throat.   Eyes: Negative for pain and visual disturbance.  Respiratory: Negative for cough and shortness of breath.   Cardiovascular:  Negative for chest pain and palpitations.  Gastrointestinal: Negative for abdominal pain and vomiting.  Genitourinary: Negative for dysuria and hematuria.  Musculoskeletal: Positive for arthralgias and back pain.  Skin: Negative for color change and rash.  Neurological: Negative for seizures and syncope.  All other systems reviewed and are negative.    Physical Exam Triage Vital Signs ED Triage Vitals  Enc Vitals Group     BP      Pulse      Resp      Temp      Temp src      SpO2      Weight      Height  Head Circumference      Peak Flow      Pain Score      Pain Loc      Pain Edu?      Excl. in [place]?    No data found.  Updated Vital Signs BP (!) 152/76 (BP Location: Right Arm)   Pulse 80   Temp 97.6 F (36.4 C) (Oral)   Resp 17   LMP 10/06/2011   SpO2 100%   Visual Acuity Right Eye Distance:   Left Eye Distance:   Bilateral Distance:    Right Eye Near:   Left Eye Near:    Bilateral Near:     Physical Exam Vitals signs and nursing note reviewed.  Constitutional:      General: She is not in acute distress.    Appearance: She is well-developed.  HENT:     Head: Normocephalic and atraumatic.  Eyes:     Conjunctiva/sclera: Conjunctivae normal.  Neck:     Musculoskeletal: Neck supple.  Cardiovascular:     Rate and Rhythm: Normal rate and regular rhythm.     Heart sounds: No murmur.  Pulmonary:     Effort: Pulmonary effort is normal. No respiratory distress.     Breath sounds: Normal breath sounds.  Abdominal:     General: Bowel sounds are normal.     Palpations: Abdomen is soft.     Tenderness: There is no abdominal tenderness. There is no guarding or rebound.  Musculoskeletal: Normal range of motion.        General: No swelling, tenderness, deformity or signs of injury.  Skin:    General: Skin is warm and dry.     Capillary Refill: Capillary refill takes less than 2 seconds.     Findings: No bruising, lesion or rash.  Neurological:      General: No focal deficit present.     Mental Status: She is alert and oriented to person, place, and time.     Sensory: No sensory deficit.     Motor: No weakness.  Psychiatric:        Mood and Affect: Mood normal.        Behavior: Behavior normal.      UC Treatments / Results  Labs (all labs ordered are listed, but only abnormal results are displayed) Labs Reviewed - No data to display  EKG   Radiology No results found.  Procedures Procedures (including critical care time)  Medications Ordered in UC Medications - No data to display  Initial Impression / Assessment and Plan / UC Course  I have reviewed the triage vital signs and the nursing notes.  Pertinent labs & imaging results that were available during my care of the patient were reviewed by me and considered in my medical decision making (see chart for details).    Acute lower back pain with sciatica.  Elevated blood pressure reading.  Treating with Flexeril; precautions for drowsiness with this medication discussed with patient.  Instructed patient to follow-up with her PCP if her pain persist or worsens.  Instructed patient to follow-up with her PCP in 2 to 4 weeks regarding her elevated blood pressure reading today.  Patient agrees to plan of care.     Final Clinical Impressions(s) / UC Diagnoses   Final diagnoses:  Elevated blood pressure reading  Acute left-sided low back pain with left-sided sciatica     Discharge Instructions     Take the prescribed muscle relaxer Flexeril as directed.  Do not drive,  operate machinery, or drink alcohol with this medication as it may cause drowsiness.    Follow-up with your primary care provider if your pain persist or worsens.  Your blood pressure is elevated today at 152/76.  Please have this rechecked by your primary care provider in 2-4 weeks.      ED Prescriptions    Medication Sig Dispense Auth. Provider   cyclobenzaprine (FLEXERIL) 10 MG tablet Take 1  tablet (10 mg total) by mouth 2 (two) times daily as needed for muscle spasms. 20 tablet Sharion Balloon, NP     I have reviewed the PDMP during this encounter.   Sharion Balloon, NP 01/26/19 1243

## 2019-01-26 NOTE — ED Triage Notes (Signed)
Pt presents with lower back pain and left hip pain since yesterday.

## 2019-02-05 ENCOUNTER — Other Ambulatory Visit: Payer: Self-pay

## 2019-02-05 ENCOUNTER — Ambulatory Visit (INDEPENDENT_AMBULATORY_CARE_PROVIDER_SITE_OTHER): Payer: 59 | Admitting: Internal Medicine

## 2019-02-05 DIAGNOSIS — R432 Parageusia: Secondary | ICD-10-CM

## 2019-02-05 DIAGNOSIS — Z86010 Personal history of colonic polyps: Secondary | ICD-10-CM

## 2019-02-05 DIAGNOSIS — K2100 Gastro-esophageal reflux disease with esophagitis, without bleeding: Secondary | ICD-10-CM | POA: Diagnosis not present

## 2019-02-05 DIAGNOSIS — K2270 Barrett's esophagus without dysplasia: Secondary | ICD-10-CM | POA: Diagnosis not present

## 2019-02-05 MED ORDER — NYSTATIN 100000 UNIT/ML MT SUSP: OROMUCOSAL | 0 refills | Status: DC

## 2019-02-05 NOTE — Progress Notes (Signed)
Patient ID: Kelly Massey, female   DOB: 1965/09/06, 53 y.o.   MRN: [id number]   Virtual Visit via video Note  This visit type was conducted due to national recommendations for restrictions regarding the COVID-19 pandemic (e.g. social distancing).  This format is felt to be most appropriate for this patient at this time.  All issues noted in this document were discussed and addressed.  No physical exam was performed (except for noted visual exam findings with Video Visits).   I connected with Kelly Massey by a video enabled telemedicine application and verified that I am speaking with the correct person using two identifiers. Location patient: home Location provider: work Persons participating in the virtual visit: patient, provider  I discussed the limitations, risks, security and privacy concerns of performing an evaluation and management service by video and the availability of in person appointments.  The patient expressed understanding and agreed to proceed.   Reason for visit: acute visit  HPI: She reports noticing scratchy throat and hoarseness.  Mild sore throat.  Worse at night.  Noticed last week - everything tasted bland.  Dry mouth.  Tongue "pasty".  No sob.  No nausea or vomiting.  Some congestion.  Yellow/green and blood tinged mucus.  No significant cough.  Has noticed being a little more winded with exertion.  Some increased burping.  She has had problems with acid reflux previously.  Also has documented history of Barretts.  Per last GI note, no findings c/w Barretts in 8 years on 3 different EGDs.  She feels a lot of her symptoms are related to worsening reflux.  We discussed possibility of covid.  She denies known exposure.  Is working.     ROS: See pertinent positives and negatives per HPI.  Past Medical History:  Diagnosis Date  . Allergy   . Anemia   . Arthritis    knees - no meds  . Asthma   . Colon polyps   . GERD (gastroesophageal reflux disease) 10/12/10   EGD, positive H. pylori  . HSV infection    History  . Hyperlipidemia    ? no meds - diet controlled  . Post-operative nausea and vomiting   . Seasonal allergies     Past Surgical History:  Procedure Laterality Date  . ABDOMINAL HYSTERECTOMY    . BALLOON DILATION N/A 07/24/2012   Procedure: BALLOON DILATION;  Surgeon: Inda Castle, MD;  Location: [facility];  Service: Endoscopy;  Laterality: N/A;  . BRAVO [place] STUDY N/A 07/24/2012   Procedure: BRAVO [place] STUDY;  Surgeon: Inda Castle, MD;  Location: [facility];  Service: Endoscopy;  Laterality: N/A;  . DILATION AND CURETTAGE OF UTERUS     SAB  . ESOPHAGOGASTRODUODENOSCOPY N/A 07/24/2012   Procedure: ESOPHAGOGASTRODUODENOSCOPY (EGD);  Surgeon: Inda Castle, MD;  Location: [facility];  Service: Endoscopy;  Laterality: N/A;  . OVARIAN CYST REMOVAL  2004   laparotomy -left  . ROBOTIC ASSISTED LAPAROSCOPIC LYSIS OF ADHESION N/A 03/21/2014   Procedure: ROBOTIC ASSISTED LAPAROSCOPIC EXTENSIVE LYSIS OF ADHESION (1 Hour);  Surgeon: Marvene Staff, MD;  Location: [facility];  Service: Gynecology;  Laterality: N/A;  . ROBOTIC ASSISTED SALPINGO OOPHERECTOMY Left 03/21/2014   Procedure:  ROBOTIC ASSISTED LEFT OOPHORECTOMY;  Surgeon: Marvene Staff, MD;  Location: [facility];  Service: Gynecology;  Laterality: Left;  . SHOULDER ARTHROSCOPY Left   . TENNIS ELBOW RELEASE/NIRSCHEL PROCEDURE Right 05/01/2015   Procedure: RIGHT ELBOW DEBRIDEMENT AND TENDON REPAIR;  Surgeon: [name],  MD;  Location: [facility];  Service: Orthopedics;  Laterality: Right;  . WISDOM TOOTH EXTRACTION      Family History  Problem Relation Age of Onset  . Arthritis Mother   . Stroke Mother   . Hypertension Mother   . Heart failure Mother   . Dementia Mother   . Breast cancer Maternal Aunt   . Prostate cancer Maternal Uncle        x 2  . Diabetes Sister   . Diabetes Brother        x 2  . Diabetes Paternal Grandmother   .  Hypertension Other   . Hyperlipidemia Other   . Diabetes Paternal Grandfather   . Lung cancer Father   . Diabetes Maternal Uncle   . Throat cancer Maternal Aunt        Smoker  . Colon cancer Neg Hx     SOCIAL HX: reviewed.    Current Outpatient Medications:  .  acyclovir (ZOVIRAX) 400 MG tablet, TAKE 1 TABLET (400 MG TOTAL) BY MOUTH 2 (TWO) TIMES DAILY AS NEEDED., Disp: 60 tablet, Rfl: 1 .  azithromycin (ZITHROMAX) 250 MG tablet, Take two tablets x 1 day and then one tablet per day for four more days., Disp: 6 tablet, Rfl: 0 .  Calcium Carbonate-Vitamin D (CALCIUM 600+D3) 600-400 MG-UNIT per tablet, Take 1 tablet by mouth daily., Disp: , Rfl:  .  cyclobenzaprine (FLEXERIL) 10 MG tablet, Take 1 tablet (10 mg total) by mouth 2 (two) times daily as needed for muscle spasms., Disp: 20 tablet, Rfl: 0 .  ferrous sulfate 325 (65 FE) MG tablet, Take 325 mg by mouth daily. , Disp: , Rfl:  .  mometasone (NASONEX) 50 MCG/ACT nasal spray, Place 2 sprays into the nose daily., Disp: 51 g, Rfl: 3 .  Multiple Vitamins-Minerals (MULTIVITAMIN WITH MINERALS) tablet, Take 1 tablet by mouth daily., Disp: , Rfl:  .  nystatin (MYCOSTATIN) 100000 UNIT/ML suspension, 5cc's swish and spit tid, Disp: 60 mL, Rfl: 0 .  Saline 0.2 % SOLN, Place into the nose. Nasal saline solution daily, Disp: , Rfl:  .  vitamin E 400 UNIT capsule, Take 400 Units by mouth daily. Reported on 10/09/2015, Disp: , Rfl:   EXAM:  GENERAL: alert, oriented, appears well and in no acute distress  HEENT: atraumatic, conjunttiva clear, no obvious abnormalities on inspection of external nose and ears  NECK: normal movements of the head and neck  LUNGS: on inspection no signs of respiratory distress, breathing rate appears normal, no obvious gross SOB, gasping or wheezing  CV: no obvious cyanosis  PSYCH/NEURO: pleasant and cooperative, no obvious depression or anxiety, speech and thought processing grossly intact  ASSESSMENT AND PLAN:   Discussed the following assessment and plan:  Barrett's esophagus Has been followed by GI.  Last evaluated 12/2018.  Note reviewed as outlined. Pt concerned that her current symptoms are related to acid reflux.  Request referral to [facility].  States taking PPI. Add pepcid in the evening.    GERD (gastroesophageal reflux disease) On prilosec.  Add pepcid in the evening as outlined.  Refer to GI.    History of colonic polyps Colonoscopy 2012.  Due 2022.   Loss of taste Has noticed change in taste, scratchy throat, increased hoarseness, mild sore throat, some congestion and a little more windedness with exertion.  Treat acid reflux as outlined.  Discussed possible covid.  Will check for covid.  Discussed self quarantined.  Call with update of symptoms.  I discussed the assessment and treatment plan with the patient. The patient was provided an opportunity to ask questions and all were answered. The patient agreed with the plan and demonstrated an understanding of the instructions.   The patient was advised to call back or seek an in-person evaluation if the symptoms worsen or if the condition fails to improve as anticipated.   Einar Pheasant, MD

## 2019-02-06 ENCOUNTER — Other Ambulatory Visit: Payer: Self-pay

## 2019-02-06 DIAGNOSIS — Z20822 Contact with and (suspected) exposure to covid-19: Secondary | ICD-10-CM

## 2019-02-06 NOTE — Progress Notes (Unsigned)
[id number] 

## 2019-02-08 LAB — NOVEL CORONAVIRUS, NAA: SARS-CoV-2, NAA: NOT DETECTED

## 2019-02-11 ENCOUNTER — Encounter: Payer: Self-pay | Admitting: Internal Medicine

## 2019-02-11 NOTE — Assessment & Plan Note (Signed)
On prilosec.  Add pepcid in the evening as outlined.  Refer to GI.

## 2019-02-11 NOTE — Assessment & Plan Note (Signed)
Has been followed by GI.  Last evaluated 12/2018.  Note reviewed as outlined. Pt concerned that her current symptoms are related to acid reflux.  Request referral to [facility].  States taking PPI. Add pepcid in the evening.

## 2019-02-11 NOTE — Assessment & Plan Note (Signed)
Colonoscopy 2012.  Due 2022.

## 2019-02-11 NOTE — Assessment & Plan Note (Signed)
Has noticed change in taste, scratchy throat, increased hoarseness, mild sore throat, some congestion and a little more windedness with exertion.  Treat acid reflux as outlined.  Discussed possible covid.  Will check for covid.  Discussed self quarantined.  Call with update of symptoms.

## 2019-03-22 ENCOUNTER — Ambulatory Visit: Payer: 59 | Admitting: Gastroenterology

## 2019-03-22 ENCOUNTER — Encounter: Payer: Self-pay | Admitting: Gastroenterology

## 2019-03-22 ENCOUNTER — Other Ambulatory Visit (INDEPENDENT_AMBULATORY_CARE_PROVIDER_SITE_OTHER): Payer: 59

## 2019-03-22 VITALS — BP 142/84 | HR 88 | Temp 97.8°F | Ht 65.5 in | Wt 194.4 lb

## 2019-03-22 DIAGNOSIS — D72819 Decreased white blood cell count, unspecified: Secondary | ICD-10-CM | POA: Diagnosis not present

## 2019-03-22 DIAGNOSIS — R1310 Dysphagia, unspecified: Secondary | ICD-10-CM

## 2019-03-22 DIAGNOSIS — Z1159 Encounter for screening for other viral diseases: Secondary | ICD-10-CM

## 2019-03-22 DIAGNOSIS — K219 Gastro-esophageal reflux disease without esophagitis: Secondary | ICD-10-CM | POA: Diagnosis not present

## 2019-03-22 LAB — CBC WITH DIFFERENTIAL/PLATELET
Basophils Absolute: 0 10*3/uL (ref 0.0–0.1)
Basophils Relative: 1 % (ref 0.0–3.0)
Eosinophils Absolute: 0 10*3/uL (ref 0.0–0.7)
Eosinophils Relative: 0.7 % (ref 0.0–5.0)
HCT: 39.1 % (ref 36.0–46.0)
Hemoglobin: 13.2 g/dL (ref 12.0–15.0)
Lymphocytes Relative: 45.3 % (ref 12.0–46.0)
Lymphs Abs: 1.7 10*3/uL (ref 0.7–4.0)
MCHC: 33.7 g/dL (ref 30.0–36.0)
MCV: 91 fl (ref 78.0–100.0)
Monocytes Absolute: 0.4 10*3/uL (ref 0.1–1.0)
Monocytes Relative: 9.9 % (ref 3.0–12.0)
Neutro Abs: 1.6 10*3/uL (ref 1.4–7.7)
Neutrophils Relative %: 43.1 % (ref 43.0–77.0)
Platelets: 215 10*3/uL (ref 150.0–400.0)
RBC: 4.3 Mil/uL (ref 3.87–5.11)
RDW: 14.8 % (ref 11.5–15.5)
WBC: 3.7 10*3/uL — ABNORMAL LOW (ref 4.0–10.5)

## 2019-03-22 MED ORDER — FAMOTIDINE 20 MG PO TABS: 20.0000 mg | ORAL_TABLET | Freq: Two times a day (BID) | ORAL | 1 refills | Status: DC

## 2019-03-22 NOTE — Progress Notes (Signed)
HPI :  53 year old female here to reestablish care for GERD and dysphagia.  I have not seen her since 2016.  She has been seen by a GI practice closer to her home at the [facility] in recent years.  She had a very remote history of an irregular Z-line on an EGD with Dr. Deatra Ina in 2012.  She had an EGD in 2014 which showed no evidence of Barrett's esophagus and then again with me in 2016 for dysphagia at which point time she had no Barrett's esophagus but had empiric dilation for dysphagia which she has had a good response to in the past.  She states EGD with dilation definitely provided benefit for her dysphagia at the time and it lasted for a fair amount of time.  She continues to have problems with reflux.  She had a sore throat at night she thinks due to acid reflux.  She has occasional pyrosis but no regurgitation.  She sleeps with the head of her bed elevated to minimize symptoms.  She has seen hematology a few times in the past for recurrent leukopenia.  While very unusual, she states they feel her leukopenia is due to her PPI use.  She has been on a variety of PPIs to include omeprazole, Nexium, Protonix, AcipHex, Zegerid, and Dexilant.  All of these have provided some benefit to her reflux symptoms but she states over time has developed associated mild leukopenia.  She was switched off of Dexilant and placed on low-dose omeprazole 20 mg a day for the past 6 months.  She has not had a follow-up CBC.  She states the omeprazole helps to some extent however she continues to have some breakthrough symptoms.  She has had recurrent dysphagia that is developed.  This usually occurs to specific foods that have a peel rind on them, and it gets hung up in her proximal esophagus and she drinks plenty of water to push things through.  It was recommended to her that she try some Pepcid at one point time, does not appear that she has done that.  She is interested in another EGD for dilation of this is  provided benefit her in the past.  She remotely has had symptoms of globus and dysphagia.  She has had an ENT evaluation in the past for this.  Since of last seen her she had a barium swallow in July 2017 which showed a small hiatal hernia but no problems with stricture/stenosis, no evidence of dysmotility.  She did have objective evidence of reflux on that exam.  She questions about other options to treat reflux given her issues with leukopenia and PPI use in the past, she is hoping to not use PPIs moving forward for this.  After I last saw her we had discussed considering esophageal manometry to assess for motility disorder, she never followed up for this.   Endoscopic history: EGD 4/14 - no GEJ, no BE, empiric dilation to 52Fr [place] EGD 7/12 - irregular z-line Colonoscopy 7/12 - left sided benign hyperplastic polyps, no adenomas  EGD 01/21/2015 - normal esophagus, biopsies negative for EoE, empiric dilation done to 19mm. Normal stomach, biopsies ruled out H pylori, benign stomach polyps, normal duodenumt  Barium swallow 10/20/15 - small HH, no stricture / dysmotility   Past Medical History:  Diagnosis Date  . Allergy   . Anemia   . Arthritis    knees - no meds  . Asthma   . Colon polyps   . GERD (  gastroesophageal reflux disease) 10/12/10   EGD, positive H. pylori  . HSV infection    History  . Hyperlipidemia    ? no meds - diet controlled  . Post-operative nausea and vomiting   . Seasonal allergies      Past Surgical History:  Procedure Laterality Date  . ABDOMINAL HYSTERECTOMY    . BALLOON DILATION N/A 07/24/2012   Procedure: BALLOON DILATION;  Surgeon: Inda Castle, MD;  Location: [facility];  Service: Endoscopy;  Laterality: N/A;  . BRAVO [place] STUDY N/A 07/24/2012   Procedure: BRAVO [place] STUDY;  Surgeon: Inda Castle, MD;  Location: [facility];  Service: Endoscopy;  Laterality: N/A;  . DILATION AND CURETTAGE OF UTERUS     SAB  . ESOPHAGOGASTRODUODENOSCOPY N/A  07/24/2012   Procedure: ESOPHAGOGASTRODUODENOSCOPY (EGD);  Surgeon: Inda Castle, MD;  Location: [facility];  Service: Endoscopy;  Laterality: N/A;  . OVARIAN CYST REMOVAL  2004   laparotomy -left  . ROBOTIC ASSISTED LAPAROSCOPIC LYSIS OF ADHESION N/A 03/21/2014   Procedure: ROBOTIC ASSISTED LAPAROSCOPIC EXTENSIVE LYSIS OF ADHESION (1 Hour);  Surgeon: Marvene Staff, MD;  Location: [facility];  Service: Gynecology;  Laterality: N/A;  . ROBOTIC ASSISTED SALPINGO OOPHERECTOMY Left 03/21/2014   Procedure:  ROBOTIC ASSISTED LEFT OOPHORECTOMY;  Surgeon: Marvene Staff, MD;  Location: [facility];  Service: Gynecology;  Laterality: Left;  . rotator cuff surgery  2017  . SHOULDER ARTHROSCOPY Left   . TENNIS ELBOW RELEASE/NIRSCHEL PROCEDURE Right 05/01/2015   Procedure: RIGHT ELBOW DEBRIDEMENT AND TENDON REPAIR;  Surgeon: [name], MD;  Location: [facility];  Service: Orthopedics;  Laterality: Right;  . WISDOM TOOTH EXTRACTION     Family History  Problem Relation Age of Onset  . Arthritis Mother   . Stroke Mother   . Hypertension Mother   . Heart failure Mother   . Dementia Mother   . Breast cancer Maternal Aunt   . Prostate cancer Maternal Uncle        x 2  . Diabetes Sister   . Diabetes Brother        x 2  . Diabetes Paternal Grandmother   . Hypertension Other   . Hyperlipidemia Other   . Diabetes Paternal Grandfather   . Lung cancer Father   . Diabetes Maternal Uncle   . Throat cancer Maternal Aunt        Smoker  . Colon cancer Neg Hx    Social History   Tobacco Use  . Smoking status: Never Smoker  . Smokeless tobacco: Never Used  Substance Use Topics  . Alcohol use: Yes    Alcohol/week: 0.0 standard drinks    Comment: occassional  . Drug use: No   Current Outpatient Medications  Medication Sig Dispense Refill  . acyclovir (ZOVIRAX) 400 MG tablet TAKE 1 TABLET (400 MG TOTAL) BY MOUTH 2 (TWO) TIMES DAILY AS NEEDED. 60 tablet 1  . Calcium  Carbonate-Vitamin D (CALCIUM 600+D3) 600-400 MG-UNIT per tablet Take 1 tablet by mouth daily.    . ferrous sulfate 325 (65 FE) MG tablet Take 325 mg by mouth daily.     . mometasone (NASONEX) 50 MCG/ACT nasal spray Place 2 sprays into the nose daily. 51 g 3  . Saline 0.2 % SOLN Place into the nose as needed.     . vitamin E 400 UNIT capsule Take 400 Units by mouth daily. Reported on 10/09/2015    . omeprazole (PRILOSEC) 20 MG capsule Take by mouth  daily.     No current facility-administered medications for this visit.   Allergies  Allergen Reactions  . Adhesive [Tape] Other (See Comments)    Skin appeared "burned" after last surg.  . Aspirin Nausea And Vomiting  . Ibuprofen Nausea And Vomiting  . Penicillins Diarrhea  . Nsaids Other (See Comments)    GI pain  . Oxycodone Other (See Comments)    Severe abdominal cramps  . Shrimp [Shellfish Allergy] Nausea And Vomiting     Review of Systems: All systems reviewed and negative except where noted in HPI.   Lab Results  Component Value Date   WBC 3.7 02/22/2017   HGB 13.9 02/22/2017   HCT 41.2 02/22/2017   MCV 90.8 02/22/2017   PLT 213 02/22/2017    Lab Results  Component Value Date   CREATININE 0.72 07/28/2016   BUN 7 07/28/2016   NA 136 07/28/2016   K 4.6 07/28/2016   CL 102 07/28/2016   CO2 29 07/28/2016    Lab Results  Component Value Date   ALT 10 07/28/2016   AST 14 07/28/2016   ALKPHOS 44 07/28/2016   BILITOT 0.4 07/28/2016     Physical Exam: BP (!) 142/84   Pulse 88   Temp 97.8 F (36.6 C)   Ht 5' 5.5" (1.664 m)   Wt 194 lb 6 oz (88.2 kg)   LMP 10/06/2011   BMI 31.85 kg/m  Constitutional: Pleasant,well-developed, female in no acute distress. HEENT: Normocephalic and atraumatic. Conjunctivae are normal. No scleral icterus. Neck supple.  Cardiovascular: Normal rate, regular rhythm.  Pulmonary/chest: Effort normal and breath sounds normal. No wheezing, rales or rhonchi. Abdominal: Soft, nondistended,  nontender.  There are no masses palpable. No hepatomegaly. Extremities: no edema Lymphadenopathy: No cervical adenopathy noted. Neurological: Alert and oriented to person place and time. Skin: Skin is warm and dry. No rashes noted. Psychiatric: Normal mood and affect. Behavior is normal.   ASSESSMENT AND PLAN: 53 year old female here to reestablish care for the following:  GERD / dysphagia - longstanding symptoms of reflux with intermittent dysphagia, also with history of globus.  She has had benefit with a variety of PPIs in the past however she states, per her consultation with hematology, that she feels these have caused her leukopenia which has appeared mild on review of the chart.  That being said she does not want to escalate beyond low-dose of PPI, will check her CBC today to ensure the white blood cell count is normal.  She otherwise has had recurrent dysphagia.  She states she has significant benefit from dilation in the past, unclear if she has a subtle proximal esophageal stricture that she gets benefit from the dilation or underlying dysmotility however her barium study argues against dysmotility.  She is requesting another EGD for dilation in regards to her recurrent symptoms.  I counseled her that she does not have Barrett's esophagus in regards to her concern about that.  We also discussed long-term management of her reflux.  She really wants to come off of medication to treat this.  I discussed the role of endoscopic therapy such as TIF.  She may be a candidate for this.  I offered her an EGD with anesthesia with empiric dilation to treat her dysphagia and make sure no changes in her anatomy and if she is a candidate for TIF.  Pending no significant abnormalities on this exam I will discuss her case with Dr. Bryan Lemma in regards to her candidacy for TIF and if  he would want formal manometry done prior to that.  Following discussion of risks and benefits of endoscopy she want to proceed.   Further recommendations pending that result.  I otherwise will add Pepcid 20 mg once to twice daily on top of her low-dose omeprazole to see if we can get better control without escalating her PPI dose.  She agreed  Leukopenia - rechecking CBC today to ensure stable  Emlenton Cellar, MD [facility]  CC: Einar Pheasant, MD

## 2019-03-22 NOTE — Patient Instructions (Addendum)
You have been scheduled for an endoscopy. Please follow written instructions given to you at your visit today. If you use inhalers (even only as needed), please bring them with you on the day of your procedure.  We have sent the following medications to your pharmacy for you to pick up at your convenience:  Pepcid 20 mg 1-2 tablets a day as needed.   Your provider has requested that you go to the basement level for lab work before leaving today. Press "B" on the elevator. The lab is located at the first door on the left as you exit the elevator.

## 2019-03-27 ENCOUNTER — Encounter: Payer: Self-pay | Admitting: Gastroenterology

## 2019-03-28 ENCOUNTER — Ambulatory Visit (INDEPENDENT_AMBULATORY_CARE_PROVIDER_SITE_OTHER): Payer: 59

## 2019-03-28 DIAGNOSIS — Z1159 Encounter for screening for other viral diseases: Secondary | ICD-10-CM

## 2019-03-29 LAB — SARS CORONAVIRUS 2 (TAT 6-24 HRS): SARS Coronavirus 2: NEGATIVE

## 2019-04-02 ENCOUNTER — Ambulatory Visit (AMBULATORY_SURGERY_CENTER): Payer: 59 | Admitting: Gastroenterology

## 2019-04-02 ENCOUNTER — Other Ambulatory Visit: Payer: Self-pay

## 2019-04-02 ENCOUNTER — Encounter: Payer: Self-pay | Admitting: Gastroenterology

## 2019-04-02 VITALS — BP 155/82 | HR 74 | Temp 98.2°F | Resp 18 | Ht 65.5 in | Wt 194.0 lb

## 2019-04-02 DIAGNOSIS — R1310 Dysphagia, unspecified: Secondary | ICD-10-CM

## 2019-04-02 DIAGNOSIS — K219 Gastro-esophageal reflux disease without esophagitis: Secondary | ICD-10-CM

## 2019-04-02 MED ORDER — SODIUM CHLORIDE 0.9 % IV SOLN: 500.0000 mL | Freq: Once | INTRAVENOUS | Status: DC

## 2019-04-02 NOTE — Progress Notes (Signed)
Pt's states no medical or surgical changes since previsit or office visit.  JB - temp DT - vitals 

## 2019-04-02 NOTE — Progress Notes (Signed)
To pacu, VSS. Report to [email or web address] 

## 2019-04-02 NOTE — Patient Instructions (Addendum)
YOU HAD AN ENDOSCOPIC PROCEDURE TODAY AT [facility]:   Refer to the procedure report that was given to you for any specific questions about what was found during the examination.  If the procedure report does not answer your questions, please call your gastroenterologist to clarify.  If you requested that your care partner not be given the details of your procedure findings, then the procedure report has been included in a sealed envelope for you to review at your convenience later.  YOU SHOULD EXPECT: Some feelings of bloating in the abdomen. Passage of more gas than usual.  Walking can help get rid of the air that was put into your GI tract during the procedure and reduce the bloating. If you had a lower endoscopy (such as a colonoscopy or flexible sigmoidoscopy) you may notice spotting of blood in your stool or on the toilet paper. If you underwent a bowel prep for your procedure, you may not have a normal bowel movement for a few days.  Please Note:  You might notice some irritation and congestion in your nose or some drainage.  This is from the oxygen used during your procedure.  There is no need for concern and it should clear up in a day or so.  SYMPTOMS TO REPORT IMMEDIATELY:  EGD Upper Endoscopy:  Vomiting of blood or coffee-ground looking material  New shortness of breath  Painful or persistent difficulty swallowing  Pain under the shoulder blades  Black tarry-looking stools  Fever of 100F or higher   For urgent or emergent issues, a gastroenterologist can be reached at any hour by calling [phone].   DIET:  Follow a Post-Dilation Diet (see handout given to you by your recovery nurse), Nothing by mouth until 4:00pm, Clear Liquids Only from 4:00pm to 5:00pm, then Soft Diet for the rest of today starting at 5:00pm, but then you may proceed to your regular diet tomorrow morning as tolerated.  Drink plenty of fluids but you should avoid alcoholic beverages for 24  hours.  MEDICATIONS: Continue present medications including Pepcid twice daily.  Please see handouts given to you by your recovery nurse.   ACTIVITY:  You should plan to take it easy for the rest of today and you should NOT DRIVE or use heavy machinery until tomorrow (because of the sedation medicines used during the test).    FOLLOW UP: Our staff will call the number listed on your records 48-72 hours following your procedure to check on you and address any questions or concerns that you may have regarding the information given to you following your procedure. If we do not reach you, we will leave a message.  We will attempt to reach you two times.  During this call, we will ask if you have developed any symptoms of COVID 19. If you develop any symptoms (ie: fever, flu-like symptoms, shortness of breath, cough etc.) before then, please call [phone].  If you test positive for Covid 19 in the 2 weeks post procedure, please call and report this information to [place].    If any biopsies were taken you will be contacted by phone or by letter within the next 1-3 weeks.  Please call us at [phone] if you have not heard about the biopsies in 3 weeks.   Thank you for allowing [place] to provide for your healthcare needs today.   SIGNATURES/CONFIDENTIALITY: You and/or your care partner have signed paperwork which will be entered into your electronic medical record.  These signatures attest to the fact that that the information above on your After Visit Summary has been reviewed and is understood.  Full responsibility of the confidentiality of this discharge information lies with you and/or your care-partner.

## 2019-04-02 NOTE — Progress Notes (Signed)
Called to room to assist during endoscopic procedure.  Patient ID and intended procedure confirmed with present staff. Received instructions for my participation in the procedure from the performing physician.  

## 2019-04-02 NOTE — Op Note (Signed)
[place] Patient Name: Kelly Massey Procedure Date: 04/02/2019 2:26 PM MRN: [id number] Endoscopist: Kelly Massey , MD Age: 53 Referring MD:  Date of Birth: Aug 29, 1965 Gender: Female Account #: [id number] Procedure:                Upper GI endoscopy Indications:              Dysphagia - recurrent, history of significant                            benefit with empiric dilation in the past,                            Follow-up of gastro-esophageal reflux disease - on                            PPI however has developed leukopenia reliably on                            PPIs, since switched to pepcid, evaluation for                            possible TIF to come off PPI Medicines:                Monitored Anesthesia Care Procedure:                Pre-Anesthesia Assessment:                           - Prior to the procedure, a History and Physical                            was performed, and patient medications and                            allergies were reviewed. The patient's tolerance of                            previous anesthesia was also reviewed. The risks                            and benefits of the procedure and the sedation                            options and risks were discussed with the patient.                            All questions were answered, and informed consent                            was obtained. Prior Anticoagulants: The patient has                            taken no previous anticoagulant or antiplatelet  agents. ASA Grade Assessment: II - A patient with                            mild systemic disease. After reviewing the risks                            and benefits, the patient was deemed in                            satisfactory condition to undergo the procedure.                           After obtaining informed consent, the endoscope was                            passed under direct vision.  Throughout the                            procedure, the patient's blood pressure, pulse, and                            oxygen saturations were monitored continuously. The                            Endoscope was introduced through the mouth, and                            advanced to the second part of duodenum. The upper                            GI endoscopy was accomplished without difficulty.                            The patient tolerated the procedure well. Scope In: Scope Out: Findings:                 Esophagogastric landmarks were identified: the                            Z-line was found at 35 cm, the gastroesophageal                            junction was found at 35 cm and the upper extent of                            the gastric folds was found at 37 cm from the                            incisors.                           A sliding 1-2 cm hiatal hernia was present.  The exam of the esophagus was otherwise normal. No                            obvious stenosis / stricture noted.                           A guidewire was placed and the scope was withdrawn.                            Empiric dilation was performed in the entire                            esophagus with a Savary dilator with mild                            resistance at 17 mm and 18 mm. Relook endoscopy                            showed no mucosal wrents.                           The entire examined stomach was normal. Retroflexed                            views showed Hill grade III classification of the                            the cardia.                           The duodenal bulb and second portion of the                            duodenum were normal. Complications:            No immediate complications. Estimated blood loss:                            Minimal. Estimated Blood Loss:     Estimated blood loss was minimal. Impression:               - Esophagogastric  landmarks identified.                           - 1-2 cm sliding hiatal hernia.                           - Normal esophagus otherwise - empiric dilation                            performed to 59mm                           - Normal stomach. Hill grade III views of the  cardia.                           - Normal duodenal bulb and second portion of the                            duodenum. Recommendation:           - Patient has a contact number available for                            emergencies. The signs and symptoms of potential                            delayed complications were discussed with the                            patient. Return to normal activities tomorrow.                            Written discharge instructions were provided to the                            patient.                           - Resume previous diet.                           - Continue present medications - pepcid twice daily                           - Await course post dilation                           - Consideration for TIF. Will discuss patient's                            candidacy with Dr. Bryan Lemma [facility] grade III may                            prohibit this) Kelly Lipps P. Kelly Delcid, MD 04/02/2019 2:47:14 PM This report has been signed electronically.

## 2019-04-04 ENCOUNTER — Telehealth: Payer: Self-pay

## 2019-04-04 NOTE — Telephone Encounter (Signed)
  Follow up Call-  Call back number 04/02/2019  Post procedure Call Back phone  # [phone]  Permission to leave phone message Yes  Some recent data might be hidden     Patient questions:  Do you have a fever, pain , or abdominal swelling? No. Pain Score  0 *  Have you tolerated food without any problems? Yes.    Have you been able to return to your normal activities? Yes.    Do you have any questions about your discharge instructions: Diet   No. Medications  No. Follow up visit  No.  Do you have questions or concerns about your Care? No.  Actions: * If pain score is 4 or above: No action needed, pain <4.  Have you developed a fever since your procedure? No 2.   Have you had an respiratory symptoms (SOB or cough) since your procedure? No 3.   Have you tested positive for COVID 19 since your procedure No  4.   Have you had any family members/close contacts diagnosed with the COVID 19 since your procedure? No   If yes to any of these questions please route to Joylene John, RN and Alphonsa Gin, RN.

## 2019-04-09 ENCOUNTER — Encounter: Payer: Self-pay | Admitting: Gastroenterology

## 2019-04-09 ENCOUNTER — Telehealth: Payer: Self-pay

## 2019-04-09 ENCOUNTER — Telehealth: Payer: Self-pay | Admitting: Gastroenterology

## 2019-04-09 NOTE — Telephone Encounter (Signed)
Did either one of you call this patient?  I would assume Dr. Havery Moros would have followed his plan from his last OV notation-??  Please let me know what I need to do for this patient

## 2019-04-09 NOTE — Telephone Encounter (Signed)
Spoke to patient. Appointment scheduled for 04/18/19 to discus GERD and possible TIF.

## 2019-04-09 NOTE — Telephone Encounter (Signed)
-----   Message from Hughie Closs, RN sent at 04/09/2019 12:30 PM EST ----- Hi Bri,   did you try to call this patient last week about a possible TIFF procedure ? If so could you call her again. I just spoke to her and I haven't called her. Thanks [employer]

## 2019-04-09 NOTE — Telephone Encounter (Signed)
-----   Message from Elma, DO sent at 04/03/2019  7:44 AM EST ----- Regarding: FW: possible TIF Claiborne Billings, Can you set up a clinic appt with me for this patient to discuss GERD and possible TIF? Thanks.  ----- Message ----- From: Yetta Flock, MD Sent: 04/02/2019   4:05 PM EST To: Lavena Bullion, DO Subject: possible TIF                                   Vito, Curious your thoughts on this patient. Has longstanding reflux, wants to come of PPI (causes leukopenia which is rare), pepcid not working well. EGD shows < 2cm HH but grade III Hill score. She has a barium study showing objective reflux. She has had intermittent dysphagia that is improved with empiric dilation. Let me know what you think when you get a chance. Thanks!  Richardson Landry

## 2019-04-09 NOTE — Telephone Encounter (Signed)
I just spoke to the patient. She has an appointment with Dr Bryan Lemma next month to discus GERD and possible TIF.

## 2019-04-18 ENCOUNTER — Other Ambulatory Visit: Payer: Self-pay

## 2019-04-18 ENCOUNTER — Ambulatory Visit: Payer: 59 | Admitting: Gastroenterology

## 2019-04-18 ENCOUNTER — Encounter: Payer: Self-pay | Admitting: Gastroenterology

## 2019-04-18 VITALS — BP 130/82 | HR 90 | Temp 98.4°F | Ht 65.5 in | Wt 192.1 lb

## 2019-04-18 DIAGNOSIS — R1319 Other dysphagia: Secondary | ICD-10-CM

## 2019-04-18 DIAGNOSIS — K2100 Gastro-esophageal reflux disease with esophagitis, without bleeding: Secondary | ICD-10-CM

## 2019-04-18 DIAGNOSIS — R1310 Dysphagia, unspecified: Secondary | ICD-10-CM

## 2019-04-18 DIAGNOSIS — K449 Diaphragmatic hernia without obstruction or gangrene: Secondary | ICD-10-CM

## 2019-04-18 NOTE — Patient Instructions (Signed)
Our office will contact you with a date and time to schedule your TIF procedure  It was a pleasure to see you today!  Vito Cirigliano, D.O.

## 2019-04-18 NOTE — Progress Notes (Addendum)
P  Chief Complaint:    GERD, dysphagia, discuss Transoral Incisionless Fundoplication (TIF)  Referring Physician: Dr. [place]aken Cellar  HPI: 54 year old female referred to me by Dr. Havery Moros for evaluation of possible antireflux intervention with Transoral Incisionless Fundoplication (TIF) with a goal to stop or significantly reduce acid suppression therapy.  She is a longstanding history of reflux for approximately 20 years, following in [place] since 2012.  Most bothersome symptom is of sore throat, which she attributes to her reflux.  Does have pyrosis and globus sensation, but no regurgitation.  Nocturnal choking intemrittently, improved since sleeping with HOB elevated.  Does have a history of leukopenia, and has been evaluated by Hematology, with impression that this is secondary to PPI use.  Has been on multiple PPIs, to include omeprazole, Nexium, Protonix, AcipHex, Zegerid, Dexilant, all with some clinical benefit but over time has developed associated mild leukopenia.  Most recently changed from [place] to low-dose omeprazole 20 mg/day 6 months ago, but still with breakthrough symptoms.  Due to the leukopenia, does not want to retrial high-dose PPI, and is instead interested in antireflux surgical options.  Pepcid was recently increased to bid and stopped omeprazole.   Does have a history of recurrent dysphagia, which has been responsive to EGD with dilations in the past.  Most recent EGD by Dr. Havery Moros last month again with empiric dilation (no mucosal rent), again with resolution of dysphagia.  WBC 3.7 in 03/2019, stable from prior. Nadir 2.6 in 2017.  GERD history: -Index symptoms: Pyrosis, sore throat, globus, belching.  Intermittent hoarseness. No regurgitation -Medications trialed:  omeprazole, Nexium, Protonix, AcipHex, Zegerid, Dexilant, Pepcid -Current medications: Pepcid 20 mg bid -Complications: Dysphagia, small hiatal hernia, reflux esophagitis on biopsies,  leukopenia from PPI  GERD evaluation: -Barium esophagram: 10/2015-small HH, no stricture/dysmotility, gastroesophageal reflux noted -Esophageal Manometry: None -pH/Impedance: None -Bravo (07/2012; on Dexilant and ranitidine): [place] 5.7-day 1, 0.9-day 2  Endoscopic History: -EGD (2012, Dr. Deatra Ina): Irregular Z-line (biopsy: Reflux gastroesophagitis without Barrett's), otherwise normal -EGD (07/2012, Dr. Deatra Ina): GE junction biopsy with reflux changes without Barrett's, empiric dilation to [place].  Bravo placed -EGD (01/2015, Dr. Havery Moros): Normal esophagus, biopsies with reflux change and negative for EOE, no Barrett's, empiric dilation to 18 mm for dysphagia with good response -EGD (03/2019, Dr. Havery Moros): 1-2 cm sliding HH, otherwise normal esophagus dilated to 18 mm Savary, Hill 2-3  GERD HRQL Questionnaire score: 21/50 (on medication)   Review of systems:     No chest pain, no SOB, no fevers, no urinary sx   Past Medical History:  Diagnosis Date  . Allergy   . Anemia   . Arthritis    knees - no meds  . Asthma   . Colon polyps   . GERD (gastroesophageal reflux disease) 10/12/10   EGD, positive H. pylori  . HSV infection    History  . Hyperlipidemia    ? no meds - diet controlled  . Post-operative nausea and vomiting   . Seasonal allergies     Patient's surgical history, family medical history, social history, medications and allergies were all reviewed in Epic    Current Outpatient Medications  Medication Sig Dispense Refill  . acyclovir (ZOVIRAX) 400 MG tablet TAKE 1 TABLET (400 MG TOTAL) BY MOUTH 2 (TWO) TIMES DAILY AS NEEDED. 60 tablet 1  . Calcium Carbonate-Vitamin D (CALCIUM 600+D3) 600-400 MG-UNIT per tablet Take 1 tablet by mouth daily.    . diphenhydrAMINE (BENADRYL) 12.5 MG/5ML elixir Take by mouth as  needed.    . famotidine (PEPCID) 20 MG tablet Take 1 tablet (20 mg total) by mouth 2 (two) times daily. 90 tablet 1  . ferrous sulfate 325 (65 FE) MG  tablet Take 325 mg by mouth daily.     . mometasone (NASONEX) 50 MCG/ACT nasal spray Place 2 sprays into the nose daily. 51 g 3  . vitamin E 400 UNIT capsule Take 400 Units by mouth daily. Reported on 10/09/2015     Current Facility-Administered Medications  Medication Dose Route Frequency Provider Last Rate Last Admin  . 0.9 %  sodium chloride infusion  500 mL Intravenous Once Armbruster, Carlota Raspberry, MD        Physical Exam:     BP 130/82   Pulse 90   Temp 98.4 F (36.9 C)   Ht 5' 5.5" (1.664 m)   Wt 192 lb 2 oz (87.1 kg)   LMP 10/06/2011   BMI 31.49 kg/m   GENERAL:  Pleasant female in NAD PSYCH: : Cooperative, normal affect NEURO: Alert and oriented x 3, no focal neurologic deficits   IMPRESSION and PLAN:    1) GERD with erosive esophagitis 2) Small hiatal hernia Longstanding history of reflux, with multiple endoscopies in the past demonstrating histologic evidence of reflux esophagitis along with reflux noted on prior barium study.  Unfortunately, while she is responsive to PPI therapy, this has been complicated by leukopenia/neutropenia, and she would like to stop acid suppression therapy.  She continues to have breakthrough symptoms on current H2RA.  Discussed the pathophysiology of GERD at length, to include the risks of untreated reflux (ie, strictures, Barrett's Esophagus, EAC, etc) as well as the possible treatment with continued medical management vs antireflux surgery. In particular, we discussed the risks, benefits, and alternatives of Transoral Incisionless Fundoplication (TIF), to include Nissen fundoplication, and the patient strongly wishes to proceed with TIF as a means to better control her reflux, repair hiatal hernia, and stop or significantly reduce acid suppression therapy.  -Plan to schedule TIF at [facility] following lifting of current COVID-19 related restrictions - Reviewed postoperative dietary and activity restrictions with patient and provided  handout -Discussed 23 hour stay  - Additional recommendations regarding medications and post-operative diet to follow TIF completion -In the meantime, to continue Pepcid as currently prescribed along with antireflux lifestyle/dietary modifications, HOB elevation, avoid eating close to bedtime, etc.   3) Dysphagia -Suspect dysphagia due to reflux burnout, as she has had clinical benefit with empiric dilations, but otherwise without pronounced stricture on multiple endoscopies and radiographic studies.  No motility disorder noted on prior barium esophagram. -Dysphagia now resolved after EGD with empiric dilation last month.  Discussed the possibility for subtle, early motility disorder, which could be effectively worsened with TIF.  Given the long duration and nonprogressive nature of the symptoms, feel that risk is low, and she agrees and would like to proceed with TIF as outlined above.     I spent 35 minutes of time, including in depth chart review, independent review of results as outlined above, communicating results with the patient directly, face-to-face time with the patient, coordinating care, and ordering studies and medications as appropriate, and documentation.    Kelly Massey ,DO, FACG 04/18/2019, 3:46 PM

## 2019-05-29 ENCOUNTER — Other Ambulatory Visit: Payer: Self-pay | Admitting: Gastroenterology

## 2019-05-29 DIAGNOSIS — K2100 Gastro-esophageal reflux disease with esophagitis, without bleeding: Secondary | ICD-10-CM

## 2019-05-29 DIAGNOSIS — K449 Diaphragmatic hernia without obstruction or gangrene: Secondary | ICD-10-CM

## 2019-05-29 DIAGNOSIS — R1319 Other dysphagia: Secondary | ICD-10-CM

## 2019-05-29 DIAGNOSIS — R1310 Dysphagia, unspecified: Secondary | ICD-10-CM

## 2019-05-30 ENCOUNTER — Telehealth: Payer: Self-pay

## 2019-05-30 NOTE — Telephone Encounter (Signed)
Patient called into the office requesting to have the post TIF diet instructions sent to her via [place]; instructions sent;  Patient advised to call back to the office at [phone] should questions/concerns arise;  Patient verbalized understanding of information/instructions;

## 2019-06-04 ENCOUNTER — Telehealth: Payer: Self-pay | Admitting: Gastroenterology

## 2019-06-04 DIAGNOSIS — K2100 Gastro-esophageal reflux disease with esophagitis, without bleeding: Secondary | ICD-10-CM

## 2019-06-04 NOTE — Telephone Encounter (Signed)
Kelly Massey, I spoke with patients ins. and the TIF procedure is not a covered procedure.  I spoke with patient and she would like to cancel this procedure because she cannot afford to pay out of pocket.  She would like to know if there is anything else that Dr. Bryan Lemma would recommend or what the next step will be.

## 2019-06-04 NOTE — Telephone Encounter (Signed)
Please review previous message and advise  COVID screening and procedure cancelled at this time;   Patient also reports she is going to call her insurance company and find out if there is anything else that can be done for them to cover the expenses  Reports the "Pepcid is not helping as much as it was previously, is there an alternative medicine that would be a little stronger to help?"  Please advise

## 2019-06-06 NOTE — Telephone Encounter (Signed)
I will be happy to dialogue with her insurance via peer to peer on her behalf to discuss the indications of TIF, particularly in her case.  She has been intolerant to PPI in the past due to leukopenia, and these are not a treatment option for her.  Pepcid not working well.  If her insurance still will not cover, I can potentially send her over to [facility] to discuss laparoscopic partial fundoplication (toupet fundoplication).  As for medications, as above, unfortunately she is intolerant to PPIs so it is not an option.  Ranitidine (Zantac) no longer on the market.  Can perhaps use cimetidine 800 mg p.o. twice daily if her insurance will approve.  Provide 90-day supply with RF 1 then can try to reduce to 400 mg p.o. twice daily.  This would be a replacement for the Pepcid if approved by her insurance.

## 2019-06-07 ENCOUNTER — Ambulatory Visit: Payer: 59 | Attending: Internal Medicine

## 2019-06-07 DIAGNOSIS — Z23 Encounter for immunization: Secondary | ICD-10-CM

## 2019-06-07 MED ORDER — CIMETIDINE 800 MG PO TABS: 800.0000 mg | ORAL_TABLET | Freq: Two times a day (BID) | ORAL | 1 refills | Status: DC

## 2019-06-07 NOTE — Telephone Encounter (Signed)
Plan for peer to peer first. Pending that conversation, will update on whether or not to send referral to [facility]. Thanks.

## 2019-06-07 NOTE — Progress Notes (Signed)
   Covid-19 Vaccination Clinic  Name:  Kelly Massey    MRN: [id number] DOB: May 14, 1965  06/07/2019  [name] was observed post Covid-19 immunization for 15 minutes without incident. She was provided with Vaccine Information Sheet and instruction to access the V-Safe system.   [name] was instructed to call 911 with any severe reactions post vaccine: Marland Kitchen Difficulty breathing  . Swelling of face and throat  . A fast heartbeat  . A bad rash all over body  . Dizziness and weakness   Immunizations Administered    Name Date Dose VIS Date Route   Pfizer COVID-19 Vaccine 06/07/2019  6:18 PM 0.3 mL 03/16/2019 Intramuscular   Manufacturer: [place]   Lot: [id number]   [place]: [id number]

## 2019-06-07 NOTE — Telephone Encounter (Signed)
Called and spoke with patient-patient has verified pharmacy listed in chart and RX has been sent; patient is requesting for Dr. Bryan Lemma to complete the peer to peer in order for her insurance "to understand the necessity of this procedure for me";   Patient is also agreeable to having the referral to be sent to [facility]-appropriate at this time?  Christy-please obtain the number for Dr. Bryan Lemma to call for the peer to peer-

## 2019-06-07 NOTE — Telephone Encounter (Signed)
Dr. Bryan Lemma,  I spoke with the authorization dept. @ [facility] [id number] Ref# [id number].   The TIF procedure is a "non covered benefit" and they will not offer a peer to peer conversation to discuss this case.

## 2019-06-11 ENCOUNTER — Other Ambulatory Visit (HOSPITAL_COMMUNITY): Payer: 59

## 2019-06-13 ENCOUNTER — Encounter (HOSPITAL_COMMUNITY): Payer: Self-pay

## 2019-06-13 ENCOUNTER — Ambulatory Visit (HOSPITAL_COMMUNITY): Admit: 2019-06-13 | Payer: 59 | Admitting: Gastroenterology

## 2019-06-13 SURGERY — ESOPHAGOGASTRODUODENOSCOPY (EGD) WITH PROPOFOL
Anesthesia: General

## 2019-06-19 ENCOUNTER — Other Ambulatory Visit: Payer: Self-pay

## 2019-06-20 ENCOUNTER — Telehealth: Payer: Self-pay | Admitting: Internal Medicine

## 2019-06-20 NOTE — Telephone Encounter (Signed)
Screened patient for Friday afternoon appointment. Patient says she has allergies and after taking medication she feels fine. She says she has a runny nose and scratchy throat. Can patient come into office?

## 2019-06-21 NOTE — Telephone Encounter (Signed)
Pt aware.

## 2019-06-21 NOTE — Telephone Encounter (Signed)
Pt c/o of scratchy throat and runny nose the end of last week and beginning of this week. Screened pt again. Stated she is not having these symptoms today. No other acute symptoms. She just has allergies. Wanted to double check with you about seeing her in office. She is ok to do virtual if you would prefer. She is a work in to discuss hot flashes.

## 2019-06-21 NOTE — Telephone Encounter (Signed)
Ok to do virtual  

## 2019-06-22 ENCOUNTER — Telehealth (INDEPENDENT_AMBULATORY_CARE_PROVIDER_SITE_OTHER): Payer: 59 | Admitting: Internal Medicine

## 2019-06-22 DIAGNOSIS — K2270 Barrett's esophagus without dysplasia: Secondary | ICD-10-CM | POA: Diagnosis not present

## 2019-06-22 DIAGNOSIS — R232 Flushing: Secondary | ICD-10-CM | POA: Diagnosis not present

## 2019-06-22 DIAGNOSIS — Z9109 Other allergy status, other than to drugs and biological substances: Secondary | ICD-10-CM | POA: Diagnosis not present

## 2019-06-22 NOTE — Progress Notes (Signed)
Patient ID: Kelly Massey, female   DOB: [birth year], 54 y.o.   MRN: [id number]   Virtual Visit via telephone Note  This visit type was conducted due to national recommendations for restrictions regarding the COVID-19 pandemic (e.g. social distancing).  This format is felt to be most appropriate for this patient at this time.  All issues noted in this document were discussed and addressed.  No physical exam was performed (except for noted visual exam findings with Video Visits).   I connected with Kelly Massey by telephone and verified that I am speaking with the correct person using two identifiers. Location patient: home Location provider: work Persons participating in the virtual visit: patient, provider  The limitations, risks, security and privacy concerns of performing an evaluation and management service by telephone and the availability of in person appointments have been discussed. The patient expressed understanding and agreed to proceed.   Reason for visit: work in appt.   HPI: Work in appt for hot flashes.  States have worsened over the last month.  Is affecting her sleep.  Wanted to discuss treatment options.  Discussed treatment options with her today.  Discussed otc treatments.  Discussed estrogen and discussed effexor.  Reports maternal aunt - breast cancer.  No significant congestion.  Is off pepcid.  Following with GI.    ROS: See pertinent positives and negatives per HPI.  Past Medical History:  Diagnosis Date  . Allergy   . Anemia   . Arthritis    knees - no meds  . Asthma   . Colon polyps   . GERD (gastroesophageal reflux disease) 10/12/10   EGD, positive H. pylori  . HSV infection    History  . Hyperlipidemia    ? no meds - diet controlled  . Post-operative nausea and vomiting   . Seasonal allergies     Past Surgical History:  Procedure Laterality Date  . ABDOMINAL HYSTERECTOMY    . BALLOON DILATION N/A 07/24/2012   Procedure: BALLOON DILATION;   Surgeon: Inda Castle, MD;  Location: [facility];  Service: Endoscopy;  Laterality: N/A;  . BRAVO [place] STUDY N/A 07/24/2012   Procedure: BRAVO [place] STUDY;  Surgeon: Inda Castle, MD;  Location: [facility];  Service: Endoscopy;  Laterality: N/A;  . DILATION AND CURETTAGE OF UTERUS     SAB  . ESOPHAGOGASTRODUODENOSCOPY N/A 07/24/2012   Procedure: ESOPHAGOGASTRODUODENOSCOPY (EGD);  Surgeon: Inda Castle, MD;  Location: [facility];  Service: Endoscopy;  Laterality: N/A;  . OVARIAN CYST REMOVAL  2004   laparotomy -left  . ROBOTIC ASSISTED LAPAROSCOPIC LYSIS OF ADHESION N/A 03/21/2014   Procedure: ROBOTIC ASSISTED LAPAROSCOPIC EXTENSIVE LYSIS OF ADHESION (1 Hour);  Surgeon: Marvene Staff, MD;  Location: [facility];  Service: Gynecology;  Laterality: N/A;  . ROBOTIC ASSISTED SALPINGO OOPHERECTOMY Left 03/21/2014   Procedure:  ROBOTIC ASSISTED LEFT OOPHORECTOMY;  Surgeon: Marvene Staff, MD;  Location: [facility];  Service: Gynecology;  Laterality: Left;  . rotator cuff surgery  2017  . SHOULDER ARTHROSCOPY Left   . TENNIS ELBOW RELEASE/NIRSCHEL PROCEDURE Right 05/01/2015   Procedure: RIGHT ELBOW DEBRIDEMENT AND TENDON REPAIR;  Surgeon: [name], MD;  Location: [facility];  Service: Orthopedics;  Laterality: Right;  . WISDOM TOOTH EXTRACTION      Family History  Problem Relation Age of Onset  . Arthritis Mother   . Stroke Mother   . Hypertension Mother   . Heart failure Mother   . Dementia Mother   .  Breast cancer Maternal Aunt   . Prostate cancer Maternal Uncle        x 2  . Diabetes Sister   . Diabetes Brother        x 2  . Diabetes Paternal Grandmother   . Hypertension Other   . Hyperlipidemia Other   . Diabetes Paternal Grandfather   . Lung cancer Father   . Diabetes Maternal Uncle   . Throat cancer Maternal Aunt        Smoker  . Esophageal cancer Maternal Aunt   . Colon cancer Neg Hx   . Stomach cancer Neg Hx   . Rectal cancer Neg Hx      SOCIAL HX: reviewed.    Current Outpatient Medications:  .  acyclovir (ZOVIRAX) 400 MG tablet, TAKE 1 TABLET (400 MG TOTAL) BY MOUTH 2 (TWO) TIMES DAILY AS NEEDED., Disp: 60 tablet, Rfl: 1 .  Calcium Carbonate-Vitamin D (CALCIUM 600+D3) 600-400 MG-UNIT per tablet, Take 1 tablet by mouth daily., Disp: , Rfl:  .  cimetidine (TAGAMET) 800 MG tablet, Take 1 tablet (800 mg total) by mouth 2 (two) times daily., Disp: 180 tablet, Rfl: 1 .  diphenhydrAMINE (BENADRYL) 12.5 MG/5ML elixir, Take by mouth as needed., Disp: , Rfl:  .  famotidine (PEPCID) 20 MG tablet, Take 1 tablet (20 mg total) by mouth 2 (two) times daily., Disp: 90 tablet, Rfl: 1 .  ferrous sulfate 325 (65 FE) MG tablet, Take 325 mg by mouth daily. , Disp: , Rfl:  .  mometasone (NASONEX) 50 MCG/ACT nasal spray, Place 2 sprays into the nose daily., Disp: 51 g, Rfl: 3 .  sucralfate (CARAFATE) 1 g tablet, Take 1 tablet (1 g total) by mouth 2 (two) times daily., Disp: 90 tablet, Rfl: 3 .  vitamin E 400 UNIT capsule, Take 400 Units by mouth daily. Reported on 10/09/2015, Disp: , Rfl:   Current Facility-Administered Medications:  .  0.9 %  sodium chloride infusion, 500 mL, Intravenous, Once, Armbruster, Carlota Raspberry, MD  EXAM:  GENERAL: alert.  Appears to be in no acute distress.  Answering questions appropriately.    PSYCH/NEURO: pleasant and cooperative, no obvious depression or anxiety, speech and thought processing grossly intact  ASSESSMENT AND PLAN:  Discussed the following assessment and plan:  Barrett's esophagus Has been followed by GI.    Environmental allergies Controlled.   Hot flashes Hot flashes as outlined.  Worsened recently.  Discussed treatment options.  After discussion, she desired no further treatment.  Wants to monitor.  Follow.      I discussed the assessment and treatment plan with the patient. The patient was provided an opportunity to ask questions and all were answered. The patient agreed with the plan  and demonstrated an understanding of the instructions.   The patient was advised to call back or seek an in-person evaluation if the symptoms worsen or if the condition fails to improve as anticipated.  I provided 23 minutes of non-face-to-face time during this encounter.   Einar Pheasant, MD

## 2019-06-27 ENCOUNTER — Telehealth: Payer: Self-pay | Admitting: Gastroenterology

## 2019-06-27 DIAGNOSIS — K2100 Gastro-esophageal reflux disease with esophagitis, without bleeding: Secondary | ICD-10-CM

## 2019-06-27 MED ORDER — SUCRALFATE 1 G PO TABS: 1.0000 g | ORAL_TABLET | Freq: Two times a day (BID) | ORAL | 3 refills | Status: DC

## 2019-06-27 NOTE — Telephone Encounter (Signed)
Was the Pepcid no longer effective? If this was working, I recommend going back to the Pepcid 20 mg PO BID. Unfortunately, the stronger medications fall into the PPI class, which has been an issue for her in the past (neutropenia). Would be ok to add Carafate BID #90, Rf3.   I am suspicious that her reflux continues to worsen, which could be due to increasing laxity of her LES (valve). I think it could be reasonable to pursue an Esophageal Manometry test and pH/Impedance test (off all acid suppression meds x5 days prior) and possiby even re-evaluate her anatomy with repeat EGD. My suspicion is that with a worsening valve function, she may now be more of a candidate for laporoscopic hernia repair (crural repair) and TIF (ie, cTIF).  If agreeable, please set up for EM and pH/Impedance at [facility] off all acid suppression meds. Alternatively, can schedule f/u appt with me in the clinic to continue to manage her refractory reflux. Thanks.

## 2019-06-27 NOTE — Telephone Encounter (Signed)
Called and spoke with patient-patient informed of MD recommendations; patient is agreeable with plan of care and verified pharmacy; RX sent to pharmacy; amb ref order entered; Patient verbalized understanding of information/instructions;  Patient was advised to call the office at [phone] if questions/concerns arise; Patient has agreed to be scheduled for eso mano (off of acid suppression) -will set up for the patient

## 2019-06-27 NOTE — Telephone Encounter (Signed)
Pt would like to speak with you about new sxs that she has been experiencing. Pls call her.

## 2019-06-27 NOTE — Telephone Encounter (Signed)
Called and spoke with patient-patient reports she is having a little "heartburn" after eating (no matter what she eats-any time of the day -especially after meals); also having a feeling of nausea to the point of "almost throwing up"; no longer taking Pepcid BID as medication was changed to Tagamet daily; patient needing symptom relief and wanted to know if she should be taking the Pepcid and the Tagamet or can the medication be changed to "something stronger"; not using OTC relief measures  Please advise

## 2019-06-28 NOTE — Telephone Encounter (Addendum)
Left message for patient to call back to the office;  Patient has been scheduled for esophageal manometry on 07/25/2019 at 8:30 am at [facility];   Instructions will be sent to the patient via MyChart per the patient's request; paper copy will also be mailed to the patient;  Patient will need to be scheduled for her COVID screening -at [place] site per patient's request; left message with [place] site to schedule appt;

## 2019-06-29 NOTE — Telephone Encounter (Signed)
Patient has been contacted and has agreed to the appt that have been set for her, which include the [facility] screening appt on 07/23/2019 at 8:55 am; instructions have been sent to the patient via [place] and have also been mailed to her address;

## 2019-07-01 ENCOUNTER — Encounter: Payer: Self-pay | Admitting: Internal Medicine

## 2019-07-01 DIAGNOSIS — R232 Flushing: Secondary | ICD-10-CM | POA: Insufficient documentation

## 2019-07-01 NOTE — Assessment & Plan Note (Signed)
Controlled.  

## 2019-07-01 NOTE — Assessment & Plan Note (Signed)
Has been followed by GI.  

## 2019-07-01 NOTE — Assessment & Plan Note (Signed)
Hot flashes as outlined.  Worsened recently.  Discussed treatment options.  After discussion, she desired no further treatment.  Wants to monitor.  Follow.

## 2019-07-04 ENCOUNTER — Ambulatory Visit: Payer: 59 | Attending: Internal Medicine

## 2019-07-04 DIAGNOSIS — Z23 Encounter for immunization: Secondary | ICD-10-CM

## 2019-07-04 NOTE — Progress Notes (Signed)
   Covid-19 Vaccination Clinic  Name:  Kelly Massey    MRN: [id number] DOB: [birth year]  07/04/2019  Kelly Massey was observed post Covid-19 immunization for 15 minutes without incident. She was provided with Vaccine Information Sheet and instruction to access the V-Safe system.   Kelly Massey was instructed to call 911 with any severe reactions post vaccine: Marland Kitchen Difficulty breathing  . Swelling of face and throat  . A fast heartbeat  . A bad rash all over body  . Dizziness and weakness   Immunizations Administered    Name Date Dose VIS Date Route   Pfizer COVID-19 Vaccine 07/04/2019  4:15 PM 0.3 mL 03/16/2019 Intramuscular   Manufacturer: [place]   Lot: [id number]   [place]: [id number]

## 2019-07-10 ENCOUNTER — Telehealth: Payer: Self-pay | Admitting: Gastroenterology

## 2019-07-10 NOTE — Telephone Encounter (Signed)
We discussed the process of the mano and pH.  The pt is concerned about working with the tube.  She was advised to speak with Dr Bryan Lemma the day of the procedure about a work note.  The pt has been advised of the information and verbalized understanding.

## 2019-07-19 ENCOUNTER — Telehealth: Payer: Self-pay | Admitting: Gastroenterology

## 2019-07-19 NOTE — Telephone Encounter (Signed)
Called and spoke with patient-all questions answered at this time; patient is requesting information concerning billing- number given to patient to call and request additional information;  Patient advised to call back to the office at [phone] should questions/concerns arise;  Patient verbalized understanding of information/instructions;

## 2019-07-23 ENCOUNTER — Other Ambulatory Visit
Admission: RE | Admit: 2019-07-23 | Discharge: 2019-07-23 | Disposition: A | Discharge status: Home / Self Care | Payer: 59 | Source: Ambulatory Visit | Attending: Gastroenterology | Admitting: Gastroenterology

## 2019-07-23 ENCOUNTER — Other Ambulatory Visit: Payer: Self-pay

## 2019-07-23 DIAGNOSIS — Z01812 Encounter for preprocedural laboratory examination: Secondary | ICD-10-CM | POA: Diagnosis present

## 2019-07-23 DIAGNOSIS — Z20822 Contact with and (suspected) exposure to covid-19: Secondary | ICD-10-CM | POA: Diagnosis not present

## 2019-07-23 LAB — SARS CORONAVIRUS 2 (TAT 6-24 HRS): SARS Coronavirus 2: NEGATIVE

## 2019-07-25 ENCOUNTER — Ambulatory Visit (HOSPITAL_COMMUNITY)
Admission: RE | Admit: 2019-07-25 | Discharge: 2019-07-25 | Disposition: A | Discharge status: Home / Self Care | Payer: 59 | Attending: Gastroenterology | Admitting: Gastroenterology

## 2019-07-25 ENCOUNTER — Encounter (HOSPITAL_COMMUNITY)
Admission: RE | Disposition: A | Discharge status: Home / Self Care | Payer: Self-pay | Source: Home / Self Care | Attending: Gastroenterology

## 2019-07-25 ENCOUNTER — Encounter (HOSPITAL_COMMUNITY): Payer: Self-pay | Admitting: Gastroenterology

## 2019-07-25 DIAGNOSIS — R0989 Other specified symptoms and signs involving the circulatory and respiratory systems: Secondary | ICD-10-CM | POA: Diagnosis not present

## 2019-07-25 DIAGNOSIS — K219 Gastro-esophageal reflux disease without esophagitis: Secondary | ICD-10-CM | POA: Diagnosis present

## 2019-07-25 DIAGNOSIS — R142 Eructation: Secondary | ICD-10-CM

## 2019-07-25 DIAGNOSIS — R198 Other specified symptoms and signs involving the digestive system and abdomen: Secondary | ICD-10-CM

## 2019-07-25 DIAGNOSIS — R09A2 Foreign body sensation, throat: Secondary | ICD-10-CM

## 2019-07-25 HISTORY — PX: 24 HOUR PH STUDY: SHX5419

## 2019-07-25 HISTORY — PX: ESOPHAGEAL MANOMETRY: SHX5429

## 2019-07-25 SURGERY — MANOMETRY, ESOPHAGUS
Anesthesia: Choice

## 2019-07-25 MED ORDER — LIDOCAINE VISCOUS HCL 2 % MT SOLN
OROMUCOSAL | Status: AC
  Filled 2019-07-25: qty 15

## 2019-07-25 SURGICAL SUPPLY — 2 items
FACESHIELD LNG OPTICON STERILE (SAFETY) IMPLANT
GLOVE BIO SURGEON STRL SZ8 (GLOVE) ×4 IMPLANT

## 2019-07-25 NOTE — Progress Notes (Signed)
Esophageal manometry done per protocol.  Prichard catheter with impedence then placed at 36 cm at left nare.  Patient tolerated well.  Patient verbalized understanding of equipment and when to return.  Reports to be sent to Dr. Harl Bowie.

## 2019-07-26 ENCOUNTER — Other Ambulatory Visit: Payer: Self-pay

## 2019-07-26 ENCOUNTER — Ambulatory Visit: Payer: 59 | Admitting: Nurse Practitioner

## 2019-07-26 ENCOUNTER — Encounter: Payer: Self-pay | Admitting: Nurse Practitioner

## 2019-07-26 ENCOUNTER — Telehealth: Payer: Self-pay | Admitting: Gastroenterology

## 2019-07-26 VITALS — BP 118/78 | HR 89 | Temp 97.2°F | Ht 65.0 in | Wt 196.6 lb

## 2019-07-26 DIAGNOSIS — L989 Disorder of the skin and subcutaneous tissue, unspecified: Secondary | ICD-10-CM

## 2019-07-26 DIAGNOSIS — D229 Melanocytic nevi, unspecified: Secondary | ICD-10-CM

## 2019-07-26 NOTE — Telephone Encounter (Signed)
Pt requested a doctor's note to excuse her from work from 4/19-4/22.  Pt had a manometry and had to quarantine after covid test.  Please fax note to [phone].

## 2019-07-26 NOTE — Telephone Encounter (Signed)
A work excuse note has been faxed to [phone].

## 2019-07-26 NOTE — Patient Instructions (Signed)
It was nice to meet you today.  I have sent a referral to your Dermatologist for evaluation of your small black skin moles on your left ankle area.    The brown spot on your left heel may be from pressure the way your heel is coming down.  Try changing the shoes that you wear so that you the pressure on the heel is different when you walk.  Continue to monitor this and show to your dermatologist if it does not resolve.

## 2019-07-26 NOTE — Progress Notes (Signed)
Established Patient Office Visit  Subjective:  Patient ID: Kelly Massey, female    DOB: 1966/01/12  Age: 54 y.o. MRN: [id number]  CC:  Chief Complaint  Patient presents with  . Acute Visit    left heel pain/sore/blister    HPI Kelly Massey presents for evaluation of left heel pain that has a sore spot that sometimes hurts her to walk or it bothers her at night. It was never a blister, ulcer or callus. It is a flat brown spot.  She wears different good quality sneakers. She does wear an uncomfortable steel toed shoe every few days for a few hours. She can recall no shoe that hurts her heel. She does not think she is putting pressure on the same area. She is on her feet during the day at work.  She has had no injury or trauma to her feet.  She is not diabetic.  Past Medical History:  Diagnosis Date  . Allergy   . Anemia   . Arthritis    knees - no meds  . Asthma   . Colon polyps   . GERD (gastroesophageal reflux disease) 10/12/10   EGD, positive H. pylori  . HSV infection    History  . Hyperlipidemia    ? no meds - diet controlled  . Post-operative nausea and vomiting   . Seasonal allergies     Past Surgical History:  Procedure Laterality Date  . [place] STUDY N/A 07/25/2019   Procedure: [place] STUDY;  Surgeon: Lavena Bullion, DO;  Location: [facility];  Service: Gastroenterology;  Laterality: N/A;  . ABDOMINAL HYSTERECTOMY    . BALLOON DILATION N/A 07/24/2012   Procedure: BALLOON DILATION;  Surgeon: Inda Castle, MD;  Location: [facility];  Service: Endoscopy;  Laterality: N/A;  . BRAVO [place] STUDY N/A 07/24/2012   Procedure: BRAVO [place] STUDY;  Surgeon: Inda Castle, MD;  Location: [facility];  Service: Endoscopy;  Laterality: N/A;  . DILATION AND CURETTAGE OF UTERUS     SAB  . ESOPHAGEAL MANOMETRY N/A 07/25/2019   Procedure: ESOPHAGEAL MANOMETRY (EM);  Surgeon: Lavena Bullion, DO;  Location: [facility];  Service: Gastroenterology;  Laterality:  N/A;  . ESOPHAGOGASTRODUODENOSCOPY N/A 07/24/2012   Procedure: ESOPHAGOGASTRODUODENOSCOPY (EGD);  Surgeon: Inda Castle, MD;  Location: [facility];  Service: Endoscopy;  Laterality: N/A;  . OVARIAN CYST REMOVAL  2004   laparotomy -left  . ROBOTIC ASSISTED LAPAROSCOPIC LYSIS OF ADHESION N/A 03/21/2014   Procedure: ROBOTIC ASSISTED LAPAROSCOPIC EXTENSIVE LYSIS OF ADHESION (1 Hour);  Surgeon: Marvene Staff, MD;  Location: [facility];  Service: Gynecology;  Laterality: N/A;  . ROBOTIC ASSISTED SALPINGO OOPHERECTOMY Left 03/21/2014   Procedure:  ROBOTIC ASSISTED LEFT OOPHORECTOMY;  Surgeon: Marvene Staff, MD;  Location: [facility];  Service: Gynecology;  Laterality: Left;  . rotator cuff surgery  2017  . SHOULDER ARTHROSCOPY Left   . TENNIS ELBOW RELEASE/NIRSCHEL PROCEDURE Right 05/01/2015   Procedure: RIGHT ELBOW DEBRIDEMENT AND TENDON REPAIR;  Surgeon: [name], MD;  Location: [facility];  Service: Orthopedics;  Laterality: Right;  . WISDOM TOOTH EXTRACTION      Family History  Problem Relation Age of Onset  . Arthritis Mother   . Stroke Mother   . Hypertension Mother   . Heart failure Mother   . Dementia Mother   . Breast cancer Maternal Aunt   . Prostate cancer Maternal Uncle  x 2  . Diabetes Sister   . Diabetes Brother        x 2  . Diabetes Paternal Grandmother   . Hypertension Other   . Hyperlipidemia Other   . Diabetes Paternal Grandfather   . Lung cancer Father   . Diabetes Maternal Uncle   . Throat cancer Maternal Aunt        Smoker  . Esophageal cancer Maternal Aunt   . Colon cancer Neg Hx   . Stomach cancer Neg Hx   . Rectal cancer Neg Hx     Social History   Socioeconomic History  . Marital status: Married    Spouse name: Not on file  . Number of children: 0  . Years of education: Not on file  . Highest education level: Not on file  Occupational History  . Occupation: RESEARCH    Employer: [employer]  Tobacco  Use  . Smoking status: Never Smoker  . Smokeless tobacco: Never Used  Substance and Sexual Activity  . Alcohol use: Yes    Alcohol/week: 0.0 standard drinks    Comment: occassional  . Drug use: No  . Sexual activity: Yes    Birth control/protection: None    Comment: hysterectomy  Other Topics Concern  . Not on file  Social History Narrative   Transport planner    Social Determinants of Health   Financial Resource Strain:   . Difficulty of Paying Living Expenses:   Food Insecurity:   . Worried About Charity fundraiser in the Last Year:   . Arboriculturist in the Last Year:   Transportation Needs:   . Film/video editor (Medical):   Marland Kitchen Lack of Transportation (Non-Medical):   Physical Activity:   . Days of Exercise per Week:   . Minutes of Exercise per Session:   Stress:   . Feeling of Stress :   Social Connections:   . Frequency of Communication with Friends and Family:   . Frequency of Social Gatherings with Friends and Family:   . Attends Religious Services:   . Active Member of Clubs or Organizations:   . Attends Archivist Meetings:   Marland Kitchen Marital Status:   Intimate Partner Violence:   . Fear of Current or Ex-Partner:   . Emotionally Abused:   Marland Kitchen Physically Abused:   . Sexually Abused:     Outpatient Medications Prior to Visit  Medication Sig Dispense Refill  . acyclovir (ZOVIRAX) 400 MG tablet TAKE 1 TABLET (400 MG TOTAL) BY MOUTH 2 (TWO) TIMES DAILY AS NEEDED. 60 tablet 1  . Calcium Carbonate-Vitamin D (CALCIUM 600+D3) 600-400 MG-UNIT per tablet Take 1 tablet by mouth daily.    . diphenhydrAMINE (BENADRYL) 12.5 MG/5ML elixir Take by mouth as needed.    . famotidine (PEPCID) 20 MG tablet Take 1 tablet (20 mg total) by mouth 2 (two) times daily. 90 tablet 1  . ferrous sulfate 325 (65 FE) MG tablet Take 325 mg by mouth daily.     . Fluocinolone Acetonide Body 0.01 % OIL Apply to scalp before shampooing. Use for 6-10 hours with the cap each week to every other  week.    . mometasone (NASONEX) 50 MCG/ACT nasal spray Place 2 sprays into the nose daily. 51 g 3  . sucralfate (CARAFATE) 1 g tablet Take 1 tablet (1 g total) by mouth 2 (two) times daily. 90 tablet 3  . vitamin E 400 UNIT capsule Take 400 Units by mouth daily. Reported  on 10/09/2015    . cimetidine (TAGAMET) 800 MG tablet Take 1 tablet (800 mg total) by mouth 2 (two) times daily. (Patient not taking: Reported on 07/26/2019) 180 tablet 1   Facility-Administered Medications Prior to Visit  Medication Dose Route Frequency Provider Last Rate Last Admin  . 0.9 %  sodium chloride infusion  500 mL Intravenous Once Armbruster, Carlota Raspberry, MD        Allergies  Allergen Reactions  . Adhesive [Tape] Other (See Comments)    Skin appeared "burned" after last surg.  . Aspirin Nausea And Vomiting  . Ibuprofen Nausea And Vomiting  . Penicillins Diarrhea  . Nsaids Other (See Comments)    GI pain  . Oxycodone Other (See Comments)    Severe abdominal cramps  . Shrimp [Shellfish Allergy] Nausea And Vomiting   Pertinent positives noted in history of present illness otherwise negative.    Objective:    Physical Exam  Skin: Skin is warm and dry.  2 black moles left ankle, a brown flat spot- <7 mm on her heel is tender with palpation. Not raised, or crusted and it does not feel deeply imbedded.     BP 118/78 (BP Location: Right Arm, Patient Position: Sitting, Cuff Size: Small)   Pulse 89   Temp (!) 97.2 F (36.2 C) (Temporal)   Ht 5\' 5"  (1.651 m)   Wt 196 lb 9.6 oz (89.2 kg)   LMP 10/06/2011   SpO2 99%   BMI 32.72 kg/m  Wt Readings from Last 3 Encounters:  07/26/19 196 lb 9.6 oz (89.2 kg)  04/18/19 192 lb 2 oz (87.1 kg)  04/02/19 194 lb (88 kg)      Assessment & Plan:   Problem List Items Addressed This Visit    None    Visit Diagnoses    Skin lesion of foot    -  Primary   Multiple atypical skin moles       Relevant Orders   Ambulatory referral to Dermatology    I have sent a  referral to your Dermatologist for evaluation of your small black skin moles on your left ankle area.    The brown spot on your left heel may be from pressure the way your heel is coming down. Etiology is unclear and Derm opinion is requested.  Try changing the shoes that you wear so that you the pressure on the heel is different when you walk.  Continue to monitor this and show to your dermatologist if it does not resolve. She may need to see Podiatry if persists.    No orders of the defined types were placed in this encounter.   Follow-up: Return if symptoms worsen or fail to improve.  Denice Paradise, NP

## 2019-07-27 ENCOUNTER — Encounter: Payer: Self-pay | Admitting: *Deleted

## 2019-08-07 DIAGNOSIS — R0989 Other specified symptoms and signs involving the circulatory and respiratory systems: Secondary | ICD-10-CM

## 2019-08-07 DIAGNOSIS — R142 Eructation: Secondary | ICD-10-CM

## 2019-08-07 DIAGNOSIS — R198 Other specified symptoms and signs involving the digestive system and abdomen: Secondary | ICD-10-CM

## 2019-08-28 ENCOUNTER — Other Ambulatory Visit: Payer: Self-pay | Admitting: Internal Medicine

## 2019-08-28 DIAGNOSIS — Z1231 Encounter for screening mammogram for malignant neoplasm of breast: Secondary | ICD-10-CM

## 2019-09-11 ENCOUNTER — Telehealth: Payer: Self-pay | Admitting: Gastroenterology

## 2019-09-11 NOTE — Telephone Encounter (Signed)
Hi Kelly Massey, this pt called again and I was able to access her benefits and explained to her the reason why she received a bill for her manometry at the hosp. She verbalized understanding. However, she was disappointed because she spoke with someone before her test who assured her that she was not going to pay any copay despite her test being done at a hospital facility.

## 2019-09-11 NOTE — Telephone Encounter (Signed)
Patient calling in reference to a bill she received from her procedure on 07/25/19

## 2019-09-12 NOTE — Telephone Encounter (Signed)
I spoke with the patient before her test and mentioned to her that the hospital will not have her pay for the test upfront. We did not discuss the cost of a copay as I would not have that information.

## 2019-10-09 ENCOUNTER — Ambulatory Visit
Admission: RE | Admit: 2019-10-09 | Discharge: 2019-10-09 | Disposition: A | Discharge status: Home / Self Care | Payer: 59 | Source: Ambulatory Visit | Attending: Internal Medicine | Admitting: Internal Medicine

## 2019-10-09 ENCOUNTER — Other Ambulatory Visit: Payer: Self-pay

## 2019-10-09 DIAGNOSIS — Z1231 Encounter for screening mammogram for malignant neoplasm of breast: Secondary | ICD-10-CM

## 2019-10-10 ENCOUNTER — Other Ambulatory Visit: Payer: Self-pay | Admitting: Internal Medicine

## 2019-11-22 ENCOUNTER — Ambulatory Visit: Payer: 59 | Admitting: Internal Medicine

## 2019-11-22 ENCOUNTER — Other Ambulatory Visit: Payer: Self-pay

## 2019-11-22 DIAGNOSIS — D72819 Decreased white blood cell count, unspecified: Secondary | ICD-10-CM | POA: Diagnosis not present

## 2019-11-22 DIAGNOSIS — E7800 Pure hypercholesterolemia, unspecified: Secondary | ICD-10-CM | POA: Diagnosis not present

## 2019-11-22 DIAGNOSIS — R142 Eructation: Secondary | ICD-10-CM

## 2019-11-22 DIAGNOSIS — H9319 Tinnitus, unspecified ear: Secondary | ICD-10-CM

## 2019-11-22 DIAGNOSIS — D708 Other neutropenia: Secondary | ICD-10-CM

## 2019-11-22 DIAGNOSIS — K2270 Barrett's esophagus without dysplasia: Secondary | ICD-10-CM

## 2019-11-22 DIAGNOSIS — K2100 Gastro-esophageal reflux disease with esophagitis, without bleeding: Secondary | ICD-10-CM

## 2019-11-22 DIAGNOSIS — Z9109 Other allergy status, other than to drugs and biological substances: Secondary | ICD-10-CM

## 2019-11-22 NOTE — Progress Notes (Signed)
Patient ID: Kelly Massey, female   DOB: Dec 01, 1965, 54 y.o.   MRN: [id number]   Subjective:    Patient ID: Kelly Massey, female    DOB: 1965-11-04, 55 y.o.   MRN: [id number]  HPI This visit occurred during the SARS-CoV-2 public health emergency.  Safety protocols were in place, including screening questions prior to the visit, additional usage of staff PPE, and extensive cleaning of exam room while observing appropriate contact time as indicated for disinfecting solutions.  Patient here for a scheduled follow up.  Has seen [facility] recently for low back pain.  Recommended injections.  No increased back pain reported today. Still having issues with burping and acid.  She is trying to watch what she eats. Has been seeing Dr Bryan Lemma.  Had esophageal manometry 07/25/19 - normal.  She did have a high number of episodes of belching.   She is taking cimetidine twice a day.  Still concerned regarding her symptoms and desires f/u with GI to discuss.  Trying to stay active.  No chest pain or sob reported. No increased cough or chest congestion reported.  Does report persistent ringing in her ears.  No pain.    Past Medical History:  Diagnosis Date  . Allergy   . Anemia   . Arthritis    knees - no meds  . Asthma   . Colon polyps   . GERD (gastroesophageal reflux disease) 10/12/10   EGD, positive H. pylori  . HSV infection    History  . Hyperlipidemia    ? no meds - diet controlled  . Post-operative nausea and vomiting   . Seasonal allergies    Past Surgical History:  Procedure Laterality Date  . [place] STUDY N/A 07/25/2019   Procedure: [place] STUDY;  Surgeon: Lavena Bullion, DO;  Location: [facility];  Service: Gastroenterology;  Laterality: N/A;  . ABDOMINAL HYSTERECTOMY    . BALLOON DILATION N/A 07/24/2012   Procedure: BALLOON DILATION;  Surgeon: Inda Castle, MD;  Location: [facility];  Service: Endoscopy;  Laterality: N/A;  . BRAVO [place] STUDY N/A 07/24/2012    Procedure: BRAVO [place] STUDY;  Surgeon: Inda Castle, MD;  Location: [facility];  Service: Endoscopy;  Laterality: N/A;  . DILATION AND CURETTAGE OF UTERUS     SAB  . ESOPHAGEAL MANOMETRY N/A 07/25/2019   Procedure: ESOPHAGEAL MANOMETRY (EM);  Surgeon: Lavena Bullion, DO;  Location: [facility];  Service: Gastroenterology;  Laterality: N/A;  . ESOPHAGOGASTRODUODENOSCOPY N/A 07/24/2012   Procedure: ESOPHAGOGASTRODUODENOSCOPY (EGD);  Surgeon: Inda Castle, MD;  Location: [facility];  Service: Endoscopy;  Laterality: N/A;  . OVARIAN CYST REMOVAL  2004   laparotomy -left  . ROBOTIC ASSISTED LAPAROSCOPIC LYSIS OF ADHESION N/A 03/21/2014   Procedure: ROBOTIC ASSISTED LAPAROSCOPIC EXTENSIVE LYSIS OF ADHESION (1 Hour);  Surgeon: Marvene Staff, MD;  Location: [facility];  Service: Gynecology;  Laterality: N/A;  . ROBOTIC ASSISTED SALPINGO OOPHERECTOMY Left 03/21/2014   Procedure:  ROBOTIC ASSISTED LEFT OOPHORECTOMY;  Surgeon: Marvene Staff, MD;  Location: [facility];  Service: Gynecology;  Laterality: Left;  . rotator cuff surgery  2017  . SHOULDER ARTHROSCOPY Left   . TENNIS ELBOW RELEASE/NIRSCHEL PROCEDURE Right 05/01/2015   Procedure: RIGHT ELBOW DEBRIDEMENT AND TENDON REPAIR;  Surgeon: [name], MD;  Location: [facility];  Service: Orthopedics;  Laterality: Right;  . WISDOM TOOTH EXTRACTION     Family History  Problem Relation Age of Onset  .  Arthritis Mother   . Stroke Mother   . Hypertension Mother   . Heart failure Mother   . Dementia Mother   . Breast cancer Maternal Aunt   . Prostate cancer Maternal Uncle        x 2  . Diabetes Sister   . Diabetes Brother        x 2  . Diabetes Paternal Grandmother   . Hypertension Other   . Hyperlipidemia Other   . Diabetes Paternal Grandfather   . Lung cancer Father   . Diabetes Maternal Uncle   . Throat cancer Maternal Aunt        Smoker  . Esophageal cancer Maternal Aunt   . Colon cancer Neg Hx   .  Stomach cancer Neg Hx   . Rectal cancer Neg Hx    Social History   Socioeconomic History  . Marital status: Married    Spouse name: Not on file  . Number of children: 0  . Years of education: Not on file  . Highest education level: Not on file  Occupational History  . Occupation: RESEARCH    Employer: [employer]  Tobacco Use  . Smoking status: Never Smoker  . Smokeless tobacco: Never Used  Vaping Use  . Vaping Use: Never used  Substance and Sexual Activity  . Alcohol use: Yes    Alcohol/week: 0.0 standard drinks    Comment: occassional  . Drug use: No  . Sexual activity: Yes    Birth control/protection: None    Comment: hysterectomy  Other Topics Concern  . Not on file  Social History Narrative   Transport planner    Social Determinants of Health   Financial Resource Strain:   . Difficulty of Paying Living Expenses: Not on file  Food Insecurity:   . Worried About Charity fundraiser in the Last Year: Not on file  . Ran Out of Food in the Last Year: Not on file  Transportation Needs:   . Lack of Transportation (Medical): Not on file  . Lack of Transportation (Non-Medical): Not on file  Physical Activity:   . Days of Exercise per Week: Not on file  . Minutes of Exercise per Session: Not on file  Stress:   . Feeling of Stress : Not on file  Social Connections:   . Frequency of Communication with Friends and Family: Not on file  . Frequency of Social Gatherings with Friends and Family: Not on file  . Attends Religious Services: Not on file  . Active Member of Clubs or Organizations: Not on file  . Attends Archivist Meetings: Not on file  . Marital Status: Not on file    Outpatient Encounter Medications as of 11/22/2019  Medication Sig  . acyclovir (ZOVIRAX) 400 MG tablet TAKE 1 TABLET (400 MG TOTAL) BY MOUTH 2 (TWO) TIMES DAILY AS NEEDED.  . Calcium Carbonate-Vitamin D (CALCIUM 600+D3) 600-400 MG-UNIT per tablet Take 1 tablet by mouth daily.  .  cimetidine (TAGAMET) 800 MG tablet Take 1 tablet (800 mg total) by mouth 2 (two) times daily. (Patient not taking: Reported on 07/26/2019)  . diphenhydrAMINE (BENADRYL) 12.5 MG/5ML elixir Take by mouth as needed.  . famotidine (PEPCID) 20 MG tablet Take 1 tablet (20 mg total) by mouth 2 (two) times daily.  . ferrous sulfate 325 (65 FE) MG tablet Take 325 mg by mouth daily.   . Fluocinolone Acetonide Body 0.01 % OIL Apply to scalp before shampooing. Use for 6-10 hours with  the cap each week to every other week.  . mometasone (NASONEX) 50 MCG/ACT nasal spray Place 2 sprays into the nose daily.  . sucralfate (CARAFATE) 1 g tablet Take 1 tablet (1 g total) by mouth 2 (two) times daily.  . vitamin E 400 UNIT capsule Take 400 Units by mouth daily. Reported on 10/09/2015   Facility-Administered Encounter Medications as of 11/22/2019  Medication  . 0.9 %  sodium chloride infusion    Review of Systems  Constitutional: Negative for appetite change and unexpected weight change.  HENT: Positive for tinnitus. Negative for congestion and sinus pressure.   Respiratory: Negative for cough, chest tightness and shortness of breath.   Cardiovascular: Negative for chest pain, palpitations and leg swelling.  Gastrointestinal: Negative for abdominal pain, diarrhea, nausea and vomiting.       Increased belching.   Genitourinary: Negative for difficulty urinating and dysuria.  Musculoskeletal: Negative for joint swelling and myalgias.  Skin: Negative for color change and rash.  Neurological: Negative for dizziness, light-headedness and headaches.  Psychiatric/Behavioral: Negative for agitation and dysphoric mood.       Objective:    Physical Exam Vitals reviewed.  Constitutional:      General: She is not in acute distress.    Appearance: Normal appearance.  HENT:     Head: Normocephalic and atraumatic.     Right Ear: External ear normal.     Left Ear: External ear normal.  Eyes:     General: No scleral  icterus.       Right eye: No discharge.        Left eye: No discharge.     Conjunctiva/sclera: Conjunctivae normal.  Neck:     Thyroid: No thyromegaly.  Cardiovascular:     Rate and Rhythm: Normal rate and regular rhythm.  Pulmonary:     Effort: No respiratory distress.     Breath sounds: Normal breath sounds. No wheezing.  Abdominal:     General: Bowel sounds are normal.     Palpations: Abdomen is soft.     Tenderness: There is no abdominal tenderness.  Musculoskeletal:        General: No swelling or tenderness.     Cervical back: Neck supple. No tenderness.  Lymphadenopathy:     Cervical: No cervical adenopathy.  Skin:    Findings: No erythema or rash.  Neurological:     Mental Status: She is alert.  Psychiatric:        Mood and Affect: Mood normal.        Behavior: Behavior normal.     BP 120/78   Pulse 91   Temp 97.9 F (36.6 C) (Oral)   Resp 16   Ht 5\' 5"  (1.651 m)   Wt 200 lb (90.7 kg)   LMP 10/06/2011   SpO2 99%   BMI 33.28 kg/m  Wt Readings from Last 3 Encounters:  11/22/19 200 lb (90.7 kg)  07/26/19 196 lb 9.6 oz (89.2 kg)  04/18/19 192 lb 2 oz (87.1 kg)     Lab Results  Component Value Date   WBC 3.7 (L) 03/22/2019   HGB 13.2 03/22/2019   HCT 39.1 03/22/2019   PLT 215.0 03/22/2019   GLUCOSE 90 07/28/2016   CHOL 200 08/06/2015   TRIG 93.0 08/06/2015   HDL 67.70 08/06/2015   LDLCALC 114 (H) 08/06/2015   ALT 10 07/28/2016   AST 14 07/28/2016   NA 136 07/28/2016   K 4.6 07/28/2016   CL 102 07/28/2016  CREATININE 0.72 07/28/2016   BUN 7 07/28/2016   CO2 29 07/28/2016   TSH 0.76 07/28/2016    MM 3D SCREEN BREAST BILATERAL  Result Date: 10/11/2019 CLINICAL DATA:  Screening. EXAM: DIGITAL SCREENING BILATERAL MAMMOGRAM WITH TOMO AND CAD COMPARISON:  Previous exam(s). ACR Breast Density Category c: The breast tissue is heterogeneously dense, which may obscure small masses. FINDINGS: There are no findings suspicious for malignancy. Images were  processed with CAD. IMPRESSION: No mammographic evidence of malignancy. A result letter of this screening mammogram will be mailed directly to the patient. RECOMMENDATION: Screening mammogram in one year. (Code:SM-B-01Y) BI-RADS CATEGORY  1: Negative. Electronically Signed   By: Curlene Dolphin M.D.   On: 10/11/2019 10:11       Assessment & Plan:   Problem List Items Addressed This Visit    Tinnitus    Persistent issue.  Refer to ENT for further evaluation.        Relevant Orders   Ambulatory referral to ENT   Other neutropenia [facility])    Previously worked up by hematology.  Follow cbc.       Leukopenia    Worked up by hematology.  Questioning if PPIs aggravating.  Follow cbc.       Relevant Orders   CBC with Differential/Platelet   Hypercholesterolemia    Low cholesterol diet and exercise.  Follow lipid panel.       Relevant Orders   Comprehensive metabolic panel   Lipid panel   TSH   GERD (gastroesophageal reflux disease)    Reports persistent problem.  Increased belching.  Trying to watch her diet.  On cimetidine.  Has seen GI as outlined.  Recent esophageal manometry as outlined.  F/u with GI as discussed.        Relevant Orders   Ambulatory referral to Gastroenterology   Environmental allergies    Appeared to be controlled.       Belching    Increased belching as outlined.  F/u with GI as discussed.       Relevant Orders   Ambulatory referral to Gastroenterology   Barrett's esophagus    Followed by GI.        Relevant Orders   Ambulatory referral to Gastroenterology       Einar Pheasant, MD

## 2019-12-02 ENCOUNTER — Encounter: Payer: Self-pay | Admitting: Internal Medicine

## 2019-12-02 DIAGNOSIS — H9319 Tinnitus, unspecified ear: Secondary | ICD-10-CM | POA: Insufficient documentation

## 2019-12-02 NOTE — Assessment & Plan Note (Signed)
Appeared to be controlled.

## 2019-12-02 NOTE — Assessment & Plan Note (Signed)
Followed by GI

## 2019-12-02 NOTE — Assessment & Plan Note (Signed)
Increased belching as outlined.  F/u with GI as discussed.

## 2019-12-02 NOTE — Assessment & Plan Note (Signed)
Previously worked up by hematology.  Follow cbc.  

## 2019-12-02 NOTE — Assessment & Plan Note (Signed)
Persistent issue.  Refer to ENT for further evaluation.

## 2019-12-02 NOTE — Assessment & Plan Note (Signed)
Reports persistent problem.  Increased belching.  Trying to watch her diet.  On cimetidine.  Has seen GI as outlined.  Recent esophageal manometry as outlined.  F/u with GI as discussed.

## 2019-12-02 NOTE — Assessment & Plan Note (Signed)
Worked up by hematology.  Questioning if PPIs aggravating.  Follow cbc.

## 2019-12-02 NOTE — Assessment & Plan Note (Signed)
Low cholesterol diet and exercise.  Follow lipid panel.   

## 2019-12-08 ENCOUNTER — Other Ambulatory Visit: Payer: Self-pay | Admitting: Internal Medicine

## 2020-01-31 ENCOUNTER — Ambulatory Visit: Payer: 59 | Admitting: Nurse Practitioner

## 2020-01-31 ENCOUNTER — Encounter: Payer: Self-pay | Admitting: Nurse Practitioner

## 2020-01-31 VITALS — BP 130/90 | HR 74 | Ht 65.0 in | Wt 201.0 lb

## 2020-01-31 DIAGNOSIS — K219 Gastro-esophageal reflux disease without esophagitis: Secondary | ICD-10-CM | POA: Diagnosis not present

## 2020-01-31 NOTE — Patient Instructions (Signed)
If you are age 54 or older, your body mass index should be between 23-30. Your Body mass index is 33.45 kg/m. If this is out of the aforementioned range listed, please consider follow up with your Primary Care Provider.  If you are age 22 or younger, your body mass index should be between 19-25. Your Body mass index is 33.45 kg/m. If this is out of the aformentioned range listed, please consider follow up with your Primary Care Provider.    Thank you for entrusting me with your care and choosing [facility].  Tye Savoy, NP

## 2020-01-31 NOTE — Progress Notes (Signed)
ASSESSMENT AND PLAN    # 54 year old female with chronic GERD symptoms.  Her main complaint is that of burning in her throat,  occasional burning in chest and frequent belching.  She has had partial responses to numerous PPIs but at the same time many of them caused leukopenia evaluated by Hematology.  Normal esophagus, small sliding hiatal hernia on last EGD in Dec 2020. After undergoing esophageal manometry and pH with impedance a TIF procedure was not recommended.  Patient currently maintained on twice daily Tagamet with ongoing burning in her throat --Saw ENT last week ( for what sounds like tinnitus) but got a scope after discussing reflux symptoms.  Patient told her throat was dry from snoring and that she had reflux.  --I will discuss her case with Dr. Havery Moros who is most familiar with her. In additional to reflux might she have functional heartburn? Would TCA be worth trying?   HISTORY OF PRESENT ILLNESS     Primary Gastroenterologist : [place] Cellar, MD  Chief Complaint : acid reflux  Kelly Massey is a 54 y.o. female with PMH / [place] significant for,  but not necessarily limited to:  Patient has a longstanding history of GERD and dysphagia and has undergone empiric dilations in the past.  She has a very remote history of an irregular Z-line on EGD by Dr. Deatra Ina in 2012 but subsequent upper endoscopies have shown no evidence for Barrett's esophagus.  Through the years patient has been on multiple different PPIs. Nexium nor Protonix helped. Aciphex, zegerid and Dexilant caused neutropenia,  she can't really remember if they helped.  She describes acid reflux symptoms as burning in her throat nearly every morning. Periodically she has burning in her chest. She seldom gets regurgitation but has frequent belching, even prior to eating.   Patient goes to bed on an empty stomach.  Currently taking Tagament 800 mg BID that Dr. Havery Moros prescribed.  Patient was last seen December  2020 at which time there was discussion of the TIF procedure (transoral incisionless fundoplication).  She underwent EGD late December with findings of a small sliding hiatal hernia, exam was otherwise normal.  She was subsequently referred to Dr. Bryan Lemma for evaluation of a TIF.  As part of work-up for the procedure patient underwent esophageal manometry/pH with impedance.  See test results below.  Based on results, TIF was not recommended  Patient recently saw ENT in [place] for ringing in ears. She told him about her GERD symptoms. Sounds like she had a laryngoscopy. 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 One particular tobacco product has caused employees to have sinus problems. She used to have more exposure to that particular type of tobacco but now only encounters it a few times a year.  She does have nasal congestion but is unsure about post-nasal drip. She has used nasonex.for years but hasn't noticed any improvement in throat symptoms.   Previous Endoscopic Evaluations / Pertinent Studies:  Dec 2020 EGD -Esophagogastric landmarks identified. - 1-2 cm sliding hiatal hernia. - Normal esophagus otherwise - empiric dilation performed to 51mm - Normal stomach. Hill grade III views of the cardia. - Normal duodenal bulb and second portion of the duodenum  April 2021 esophageal manometry and pH/impedance study  -Esophageal manometry was normal.   -pH/impedance off PPI demonstrated very high number of episodes of belching but very few were actually associated with reflux.  There was normal esophageal acid exposure with DeMeester score of 4.3 and a low number of true reflux episodes   July 2012 Colonoscopy  - left sided benign hyperplastic polyps, no adenomas  Past Medical History:  Diagnosis Date  . Allergy   . Anemia   . Arthritis    knees - no meds  . Asthma   . Colon polyps   . GERD (gastroesophageal reflux disease) 10/12/10     EGD, positive H. pylori  . HSV infection    History  . Hyperlipidemia    ? no meds - diet controlled  . Post-operative nausea and vomiting   . Seasonal allergies     Current Medications, Allergies, Past Surgical History, Family History and Social History were reviewed in [employer] record.   Current Outpatient Medications  Medication Sig Dispense Refill  . acyclovir (ZOVIRAX) 400 MG tablet TAKE 1 TABLET (400 MG TOTAL) BY MOUTH 2 (TWO) TIMES DAILY AS NEEDED. 60 tablet 1  . Calcium Carbonate-Vitamin D (CALCIUM 600+D3) 600-400 MG-UNIT per tablet Take 1 tablet by mouth daily.    . cimetidine (TAGAMET) 800 MG tablet Take 1 tablet (800 mg total) by mouth 2 (two) times daily. (Patient not taking: Reported on 07/26/2019) 180 tablet 1  . diphenhydrAMINE (BENADRYL) 12.5 MG/5ML elixir Take by mouth as needed.    . famotidine (PEPCID) 20 MG tablet Take 1 tablet (20 mg total) by mouth 2 (two) times daily. (Patient not taking: Reported on 01/31/2020) 90 tablet 1  . ferrous sulfate 325 (65 FE) MG tablet Take 325 mg by mouth daily.     . Fluocinolone Acetonide Body 0.01 % OIL Apply to scalp before shampooing. Use for 6-10 hours with the cap each week to every other week.    . mometasone (NASONEX) 50 MCG/ACT nasal spray Place 2 sprays into the nose daily. 51 g 3  . sucralfate (CARAFATE) 1 g tablet Take 1 tablet (1 g total) by mouth 2 (two) times daily. (Patient not taking: Reported on 01/31/2020) 90 tablet 3  . vitamin E 400 UNIT capsule Take 400 Units by mouth daily. Reported on 10/09/2015     Current Facility-Administered Medications  Medication Dose Route Frequency Provider Last Rate Last Admin  . 0.9 %  sodium chloride infusion  500 mL Intravenous Once Armbruster, Carlota Raspberry, MD        Review of Systems: No chest pain. No shortness of breath. No urinary complaints.   PHYSICAL EXAM :    Wt Readings from Last 3 Encounters:  01/31/20 201 lb (91.2 kg)  11/22/19 200 lb (90.7  kg)  07/26/19 196 lb 9.6 oz (89.2 kg)    Ht 5\' 5"  (1.651 m)   Wt 201 lb (91.2 kg)   LMP 10/06/2011   BMI 33.45 kg/m  Constitutional:  Pleasant female in no acute distress. Psychiatric: Normal mood and affect. Behavior is normal. EENT: Pupils normal.  Conjunctivae are normal. No scleral icterus. Neck supple.  Cardiovascular: Normal rate, regular rhythm. No edema Pulmonary/chest: Effort normal and breath sounds normal. No wheezing, rales or rhonchi. Abdominal: Soft, nondistended, nontender. Bowel sounds active throughout. There are no masses palpable. No hepatomegaly. Neurological: Alert and oriented to person place and time. Skin: Skin is warm and dry. No rashes noted.  I spent 30 minutes total reviewing records, obtaining history, performing exam, counseling patient and documenting visit / findings.    Tye Savoy, NP  01/31/2020, 3:32 PM

## 2020-02-01 NOTE — Progress Notes (Signed)
Difficult situation. She has not done well with PPIs and has had neutropenia from them. Further, pH study OFF PPI showed no evidence of pathologic reflux. She could be having functional heartburn. If no medication interactions or contraindications I agree with a trial of nortriptyline, would start at 20mg  q HS and titrate up from there. She can continue carafate and H2 blocker PRN if she finds benefit, in the interim

## 2020-02-06 NOTE — Progress Notes (Signed)
She takes BID Tagamet which can potentially increase levels of nortriptyline. We can offer her to go back on BID pepcid which doesn't have the same interaction. I'm not sure why she was ever changed to Tagament, maybe pepcid stopped working?  Hopefully she would be willing to make the switch so we can start Nortriptyline and see if it helps.

## 2020-02-07 ENCOUNTER — Other Ambulatory Visit: Payer: Self-pay | Admitting: Gastroenterology

## 2020-02-07 DIAGNOSIS — K2100 Gastro-esophageal reflux disease with esophagitis, without bleeding: Secondary | ICD-10-CM

## 2020-02-07 NOTE — Progress Notes (Signed)
Thanks for noting that Nevin Bloodgood, I appreciate it. Yes I would switch to pepcid BID and then add the nortriptyline if she is willing. Thanks

## 2020-02-19 ENCOUNTER — Telehealth: Payer: Self-pay

## 2020-02-19 ENCOUNTER — Other Ambulatory Visit: Payer: Self-pay

## 2020-02-19 MED ORDER — NORTRIPTYLINE HCL 25 MG PO CAPS: 25.0000 mg | ORAL_CAPSULE | Freq: Every day | ORAL | 1 refills | Status: DC

## 2020-02-19 MED ORDER — FAMOTIDINE 20 MG PO TABS: 20.0000 mg | ORAL_TABLET | Freq: Two times a day (BID) | ORAL | 1 refills | Status: DC

## 2020-02-19 NOTE — Telephone Encounter (Signed)
-----   Message from Willia Craze, NP sent at 02/18/2020  5:58 PM EST ----- [name], please call the patient and ask if willing to change from tagamet to Pepcid 20 mg twice daily.  If she will switch to Pepcid then we add nortriptyline 20 mg at bedtime to see if this will help her ongoing chest discomfort.  She should not take nortriptyline with Tagamet because of potential for interaction which is why we are trying to make the switch to pepcid. Thanks

## 2020-02-19 NOTE — Telephone Encounter (Signed)
Spoke with the patient. She agrees to this plan of care. She will stop Cimetidine. New prescriptions to the [facility] in [place]. Pepcid 20 mg BID Nortriptyline 20 mg at hs

## 2020-02-22 ENCOUNTER — Encounter: Payer: Self-pay | Admitting: Internal Medicine

## 2020-02-22 ENCOUNTER — Other Ambulatory Visit: Payer: Self-pay

## 2020-02-22 ENCOUNTER — Ambulatory Visit (INDEPENDENT_AMBULATORY_CARE_PROVIDER_SITE_OTHER): Payer: 59 | Admitting: Internal Medicine

## 2020-02-22 DIAGNOSIS — K2270 Barrett's esophagus without dysplasia: Secondary | ICD-10-CM

## 2020-02-22 DIAGNOSIS — D72819 Decreased white blood cell count, unspecified: Secondary | ICD-10-CM

## 2020-02-22 DIAGNOSIS — E7800 Pure hypercholesterolemia, unspecified: Secondary | ICD-10-CM

## 2020-02-22 DIAGNOSIS — Z0000 Encounter for general adult medical examination without abnormal findings: Secondary | ICD-10-CM | POA: Diagnosis not present

## 2020-02-22 DIAGNOSIS — Z9109 Other allergy status, other than to drugs and biological substances: Secondary | ICD-10-CM | POA: Diagnosis not present

## 2020-02-22 DIAGNOSIS — K2100 Gastro-esophageal reflux disease with esophagitis, without bleeding: Secondary | ICD-10-CM

## 2020-02-22 DIAGNOSIS — M5430 Sciatica, unspecified side: Secondary | ICD-10-CM

## 2020-02-22 MED ORDER — MOMETASONE FUROATE 50 MCG/ACT NA SUSP: 2.0000 | Freq: Every day | NASAL | 1 refills | Status: DC

## 2020-02-22 NOTE — Progress Notes (Signed)
Patient ID: Kelly Massey, female   DOB: 03-Jul-1965, 54 y.o.   MRN: [id number]   Subjective:    Patient ID: Kelly Massey, female    DOB: Aug 09, 1965, 54 y.o.   MRN: [id number]  HPI This visit occurred during the SARS-CoV-2 public health emergency.  Safety protocols were in place, including screening questions prior to the visit, additional usage of staff PPE, and extensive cleaning of exam room while observing appropriate contact time as indicated for disinfecting solutions.  Patient here for her physical exam.  She sees gyn for breast and pelvic exams.  Has f/u planned 02/28/20 with Dr Garwin Brothers. Recently evaluated by GI and GYN for persistent acid reflux issues and burning in her throat.  Notes reviewed.  Instructed to take pepcid bid.  Also rx given to start nortriptyline.  She has not started these medications, but plans to start.  No chest pain or sob reported.  Eating.  No vomiting.  No abdominal pain.  Bowels moving.  History of sciatica.  S/p injection and prednisone taper.  Still with discomfort.  Discussed stretches.  Discussed PT.  She is agreeable.    Past Medical History:  Diagnosis Date  . Allergy   . Anemia   . Arthritis    knees - no meds  . Asthma   . Colon polyps   . GERD (gastroesophageal reflux disease) 10/12/10   EGD, positive H. pylori  . HSV infection    History  . Hyperlipidemia    ? no meds - diet controlled  . Post-operative nausea and vomiting   . Seasonal allergies    Past Surgical History:  Procedure Laterality Date  . [place] STUDY N/A 07/25/2019   Procedure: [place] STUDY;  Surgeon: Lavena Bullion, DO;  Location: [facility];  Service: Gastroenterology;  Laterality: N/A;  . ABDOMINAL HYSTERECTOMY    . BALLOON DILATION N/A 07/24/2012   Procedure: BALLOON DILATION;  Surgeon: Inda Castle, MD;  Location: [facility];  Service: Endoscopy;  Laterality: N/A;  . BRAVO [place] STUDY N/A 07/24/2012   Procedure: BRAVO [place] STUDY;  Surgeon: Inda Castle,  MD;  Location: [facility];  Service: Endoscopy;  Laterality: N/A;  . DILATION AND CURETTAGE OF UTERUS     SAB  . ESOPHAGEAL MANOMETRY N/A 07/25/2019   Procedure: ESOPHAGEAL MANOMETRY (EM);  Surgeon: Lavena Bullion, DO;  Location: [facility];  Service: Gastroenterology;  Laterality: N/A;  . ESOPHAGOGASTRODUODENOSCOPY N/A 07/24/2012   Procedure: ESOPHAGOGASTRODUODENOSCOPY (EGD);  Surgeon: Inda Castle, MD;  Location: [facility];  Service: Endoscopy;  Laterality: N/A;  . OVARIAN CYST REMOVAL  2004   laparotomy -left  . ROBOTIC ASSISTED LAPAROSCOPIC LYSIS OF ADHESION N/A 03/21/2014   Procedure: ROBOTIC ASSISTED LAPAROSCOPIC EXTENSIVE LYSIS OF ADHESION (1 Hour);  Surgeon: Marvene Staff, MD;  Location: [facility];  Service: Gynecology;  Laterality: N/A;  . ROBOTIC ASSISTED SALPINGO OOPHERECTOMY Left 03/21/2014   Procedure:  ROBOTIC ASSISTED LEFT OOPHORECTOMY;  Surgeon: Marvene Staff, MD;  Location: [facility];  Service: Gynecology;  Laterality: Left;  . rotator cuff surgery  2017  . SHOULDER ARTHROSCOPY Left   . TENNIS ELBOW RELEASE/NIRSCHEL PROCEDURE Right 05/01/2015   Procedure: RIGHT ELBOW DEBRIDEMENT AND TENDON REPAIR;  Surgeon: [name], MD;  Location: [facility];  Service: Orthopedics;  Laterality: Right;  . WISDOM TOOTH EXTRACTION     Family History  Problem Relation Age of Onset  . Arthritis Mother   . Stroke Mother   .  Hypertension Mother   . Heart failure Mother   . Dementia Mother   . Breast cancer Maternal Aunt   . Prostate cancer Maternal Uncle        x 2  . Diabetes Sister   . Diabetes Brother        x 2  . Diabetes Paternal Grandmother   . Hypertension Other   . Hyperlipidemia Other   . Diabetes Paternal Grandfather   . Lung cancer Father   . Diabetes Maternal Uncle   . Throat cancer Maternal Aunt        Smoker  . Esophageal cancer Maternal Aunt   . Colon cancer Neg Hx   . Stomach cancer Neg Hx   . Rectal cancer Neg Hx     Social History   Socioeconomic History  . Marital status: Married    Spouse name: Not on file  . Number of children: 0  . Years of education: Not on file  . Highest education level: Not on file  Occupational History  . Occupation: RESEARCH    Employer: [employer]  Tobacco Use  . Smoking status: Never Smoker  . Smokeless tobacco: Never Used  Vaping Use  . Vaping Use: Never used  Substance and Sexual Activity  . Alcohol use: Yes    Alcohol/week: 0.0 standard drinks    Comment: occassional  . Drug use: No  . Sexual activity: Yes    Birth control/protection: None    Comment: hysterectomy  Other Topics Concern  . Not on file  Social History Narrative   Transport planner    Social Determinants of Health   Financial Resource Strain:   . Difficulty of Paying Living Expenses: Not on file  Food Insecurity:   . Worried About Charity fundraiser in the Last Year: Not on file  . Ran Out of Food in the Last Year: Not on file  Transportation Needs:   . Lack of Transportation (Medical): Not on file  . Lack of Transportation (Non-Medical): Not on file  Physical Activity:   . Days of Exercise per Week: Not on file  . Minutes of Exercise per Session: Not on file  Stress:   . Feeling of Stress : Not on file  Social Connections:   . Frequency of Communication with Friends and Family: Not on file  . Frequency of Social Gatherings with Friends and Family: Not on file  . Attends Religious Services: Not on file  . Active Member of Clubs or Organizations: Not on file  . Attends Archivist Meetings: Not on file  . Marital Status: Not on file   Outpatient Encounter Medications as of 02/22/2020  Medication Sig  . acyclovir (ZOVIRAX) 400 MG tablet TAKE 1 TABLET (400 MG TOTAL) BY MOUTH 2 (TWO) TIMES DAILY AS NEEDED.  . Calcium Carbonate-Vitamin D (CALCIUM 600+D3) 600-400 MG-UNIT per tablet Take 1 tablet by mouth daily.  . diphenhydrAMINE (BENADRYL) 12.5 MG/5ML elixir Take by  mouth as needed.  . famotidine (PEPCID) 20 MG tablet Take 1 tablet (20 mg total) by mouth 2 (two) times daily.  . ferrous sulfate 325 (65 FE) MG tablet Take 325 mg by mouth daily.   . Fluocinolone Acetonide Body 0.01 % OIL Apply to scalp before shampooing. Use for 6-10 hours with the cap each week to every other week.  . mometasone (NASONEX) 50 MCG/ACT nasal spray Place 2 sprays into the nose daily.  . nortriptyline (PAMELOR) 25 MG capsule Take 1 capsule (25 mg total)  by mouth at bedtime.  . sucralfate (CARAFATE) 1 g tablet Take 1 tablet (1 g total) by mouth 2 (two) times daily. (Patient not taking: Reported on 01/31/2020)  . vitamin E 400 UNIT capsule Take 400 Units by mouth daily. Reported on 10/09/2015  . [DISCONTINUED] mometasone (NASONEX) 50 MCG/ACT nasal spray Place 2 sprays into the nose daily.   Facility-Administered Encounter Medications as of 02/22/2020  Medication  . 0.9 %  sodium chloride infusion    Review of Systems  Constitutional: Negative for appetite change and unexpected weight change.  HENT: Negative for congestion, sinus pressure and sore throat.   Eyes: Negative for pain and visual disturbance.  Respiratory: Negative for cough, chest tightness and shortness of breath.   Cardiovascular: Negative for chest pain, palpitations and leg swelling.  Gastrointestinal: Negative for abdominal pain, diarrhea, nausea and vomiting.  Genitourinary: Negative for difficulty urinating and dysuria.  Musculoskeletal: Negative for joint swelling and myalgias.       Sciatic pain as outlined.    Skin: Negative for color change and rash.  Neurological: Negative for dizziness, light-headedness and headaches.  Hematological: Negative for adenopathy. Does not bruise/bleed easily.  Psychiatric/Behavioral: Negative for agitation and dysphoric mood.       Objective:    Physical Exam Vitals reviewed.  Constitutional:      General: She is not in acute distress.    Appearance: Normal  appearance.  HENT:     Head: Normocephalic and atraumatic.     Right Ear: External ear normal.     Left Ear: External ear normal.  Eyes:     General: No scleral icterus.       Right eye: No discharge.        Left eye: No discharge.     Conjunctiva/sclera: Conjunctivae normal.  Neck:     Thyroid: No thyromegaly.  Cardiovascular:     Rate and Rhythm: Normal rate and regular rhythm.  Pulmonary:     Effort: No respiratory distress.     Breath sounds: Normal breath sounds. No wheezing.  Abdominal:     General: Bowel sounds are normal.     Palpations: Abdomen is soft.     Tenderness: There is no abdominal tenderness.  Musculoskeletal:        General: No swelling or tenderness.     Cervical back: Neck supple. No tenderness.  Lymphadenopathy:     Cervical: No cervical adenopathy.  Skin:    Findings: No erythema or rash.  Neurological:     Mental Status: She is alert.  Psychiatric:        Mood and Affect: Mood normal.        Behavior: Behavior normal.     BP 126/70   Pulse 83   Temp 98.1 F (36.7 C) (Oral)   Resp 16   Ht 5\' 5"  (1.651 m)   Wt 203 lb (92.1 kg)   LMP 10/06/2011   SpO2 98%   BMI 33.78 kg/m  Wt Readings from Last 3 Encounters:  02/22/20 203 lb (92.1 kg)  01/31/20 201 lb (91.2 kg)  11/22/19 200 lb (90.7 kg)     Lab Results  Component Value Date   WBC 3.7 (L) 03/22/2019   HGB 13.2 03/22/2019   HCT 39.1 03/22/2019   PLT 215.0 03/22/2019   GLUCOSE 90 07/28/2016   CHOL 200 08/06/2015   TRIG 93.0 08/06/2015   HDL 67.70 08/06/2015   LDLCALC 114 (H) 08/06/2015   ALT 10 07/28/2016   AST 14  07/28/2016   NA 136 07/28/2016   K 4.6 07/28/2016   CL 102 07/28/2016   CREATININE 0.72 07/28/2016   BUN 7 07/28/2016   CO2 29 07/28/2016   TSH 0.76 07/28/2016    MM 3D SCREEN BREAST BILATERAL  Result Date: 10/11/2019 CLINICAL DATA:  Screening. EXAM: DIGITAL SCREENING BILATERAL MAMMOGRAM WITH TOMO AND CAD COMPARISON:  Previous exam(s). ACR Breast Density  Category c: The breast tissue is heterogeneously dense, which may obscure small masses. FINDINGS: There are no findings suspicious for malignancy. Images were processed with CAD. IMPRESSION: No mammographic evidence of malignancy. A result letter of this screening mammogram will be mailed directly to the patient. RECOMMENDATION: Screening mammogram in one year. (Code:SM-B-01Y) BI-RADS CATEGORY  1: Negative. Electronically Signed   By: Curlene Dolphin M.D.   On: 10/11/2019 10:11       Assessment & Plan:   Problem List Items Addressed This Visit    Sciatica    Sciatica - s/p injection and prednisone taper.  Still with discomfort.  Discussed stretches.  Discussed PT.  She is in agreement.        Relevant Orders   Ambulatory referral to Physical Therapy   Leukopenia    Worked up by hematology.  Concern regarding PPIs contributing.  Follow cbc.       Hypercholesterolemia    Low cholesterol diet and exercise.  Follow lipid panel.       Health care maintenance    Gets breast, pelvic and pap smears through gyn.  Mammogram 10/11/19 - birads I.  Followed by GI.       GERD (gastroesophageal reflux disease)    Persistent reflux as outlined.  Has seen GI and ENT.  Recommended pepcid bid and nortriptyline.  Plans to start.  Follow.       Environmental allergies    Stable.  Controlled.        Barrett's esophagus    Followed by GI.           Einar Pheasant, MD

## 2020-02-23 ENCOUNTER — Encounter: Payer: Self-pay | Admitting: Internal Medicine

## 2020-02-23 DIAGNOSIS — M5430 Sciatica, unspecified side: Secondary | ICD-10-CM | POA: Insufficient documentation

## 2020-02-23 NOTE — Assessment & Plan Note (Signed)
Low cholesterol diet and exercise.  Follow lipid panel.   

## 2020-02-23 NOTE — Assessment & Plan Note (Signed)
Sciatica - s/p injection and prednisone taper.  Still with discomfort.  Discussed stretches.  Discussed PT.  She is in agreement.

## 2020-02-23 NOTE — Assessment & Plan Note (Signed)
Followed by GI

## 2020-02-23 NOTE — Assessment & Plan Note (Signed)
Gets breast, pelvic and pap smears through gyn.  Mammogram 10/11/19 - birads I.  Followed by GI.

## 2020-02-23 NOTE — Assessment & Plan Note (Signed)
Stable. Controlled. 

## 2020-02-23 NOTE — Assessment & Plan Note (Signed)
Worked up by hematology.  Concern regarding PPIs contributing.  Follow cbc.

## 2020-02-23 NOTE — Assessment & Plan Note (Signed)
Persistent reflux as outlined.  Has seen GI and ENT.  Recommended pepcid bid and nortriptyline.  Plans to start.  Follow.

## 2020-02-26 ENCOUNTER — Other Ambulatory Visit (INDEPENDENT_AMBULATORY_CARE_PROVIDER_SITE_OTHER): Payer: 59

## 2020-02-26 ENCOUNTER — Other Ambulatory Visit: Payer: Self-pay

## 2020-02-26 DIAGNOSIS — D72819 Decreased white blood cell count, unspecified: Secondary | ICD-10-CM | POA: Diagnosis not present

## 2020-02-26 DIAGNOSIS — E7800 Pure hypercholesterolemia, unspecified: Secondary | ICD-10-CM

## 2020-02-26 LAB — LIPID PANEL
Cholesterol: 262 mg/dL — ABNORMAL HIGH (ref 0–200)
HDL: 87.5 mg/dL (ref >39.00)
LDL Cholesterol: 156 mg/dL — ABNORMAL HIGH (ref 0–99)
NonHDL: 174.11
Total CHOL/HDL Ratio: 3
Triglycerides: 90 mg/dL (ref 0.0–149.0)
VLDL: 18 mg/dL (ref 0.0–40.0)

## 2020-02-26 LAB — COMPREHENSIVE METABOLIC PANEL
ALT: 12 U/L (ref 0–35)
AST: 16 U/L (ref 0–37)
Albumin: 4.3 g/dL (ref 3.5–5.2)
Alkaline Phosphatase: 56 U/L (ref 39–117)
BUN: 9 mg/dL (ref 6–23)
CO2: 32 mEq/L (ref 19–32)
Calcium: 9.4 mg/dL (ref 8.4–10.5)
Chloride: 102 mEq/L (ref 96–112)
Creatinine, Ser: 0.84 mg/dL (ref 0.40–1.20)
GFR: 78.64 mL/min (ref >60.00)
Glucose, Bld: 95 mg/dL (ref 70–99)
Potassium: 4.8 mEq/L (ref 3.5–5.1)
Sodium: 140 mEq/L (ref 135–145)
Total Bilirubin: 0.8 mg/dL (ref 0.2–1.2)
Total Protein: 6.8 g/dL (ref 6.0–8.3)

## 2020-02-26 LAB — CBC WITH DIFFERENTIAL/PLATELET
Basophils Absolute: 0 10*3/uL (ref 0.0–0.1)
Basophils Relative: 0.8 % (ref 0.0–3.0)
Eosinophils Absolute: 0 10*3/uL (ref 0.0–0.7)
Eosinophils Relative: 1.1 % (ref 0.0–5.0)
HCT: 42 % (ref 36.0–46.0)
Hemoglobin: 14.2 g/dL (ref 12.0–15.0)
Lymphocytes Relative: 54 % — ABNORMAL HIGH (ref 12.0–46.0)
Lymphs Abs: 1.5 10*3/uL (ref 0.7–4.0)
MCHC: 33.9 g/dL (ref 30.0–36.0)
MCV: 91.3 fl (ref 78.0–100.0)
Monocytes Absolute: 0.4 10*3/uL (ref 0.1–1.0)
Monocytes Relative: 12.9 % — ABNORMAL HIGH (ref 3.0–12.0)
Neutro Abs: 0.9 10*3/uL — ABNORMAL LOW (ref 1.4–7.7)
Neutrophils Relative %: 31.2 % — ABNORMAL LOW (ref 43.0–77.0)
Platelets: 218 10*3/uL (ref 150.0–400.0)
RBC: 4.59 Mil/uL (ref 3.87–5.11)
RDW: 14.1 % (ref 11.5–15.5)
WBC: 2.8 10*3/uL — ABNORMAL LOW (ref 4.0–10.5)

## 2020-02-26 LAB — TSH: TSH: 0.94 u[IU]/mL (ref 0.35–4.50)

## 2020-02-26 NOTE — Addendum Note (Signed)
Addended by: Leeanne Rio on: 02/26/2020 09:27 AM   Modules accepted: Orders

## 2020-02-27 ENCOUNTER — Other Ambulatory Visit: Payer: 59

## 2020-02-27 ENCOUNTER — Other Ambulatory Visit: Payer: Self-pay | Admitting: Internal Medicine

## 2020-02-27 DIAGNOSIS — D72819 Decreased white blood cell count, unspecified: Secondary | ICD-10-CM

## 2020-02-27 NOTE — Progress Notes (Signed)
Order placed for f/u cbc.   

## 2020-03-20 ENCOUNTER — Ambulatory Visit: Payer: 59

## 2020-03-25 ENCOUNTER — Other Ambulatory Visit (INDEPENDENT_AMBULATORY_CARE_PROVIDER_SITE_OTHER): Payer: 59

## 2020-03-25 ENCOUNTER — Other Ambulatory Visit: Payer: Self-pay

## 2020-03-25 DIAGNOSIS — D72819 Decreased white blood cell count, unspecified: Secondary | ICD-10-CM | POA: Diagnosis not present

## 2020-03-25 NOTE — Addendum Note (Signed)
Addended by: Leeanne Rio on: 03/25/2020 04:14 PM   Modules accepted: Orders

## 2020-03-25 NOTE — Addendum Note (Signed)
Addended by: Leeanne Rio on: 03/25/2020 04:18 PM   Modules accepted: Orders

## 2020-03-27 ENCOUNTER — Other Ambulatory Visit: Payer: Self-pay

## 2020-03-27 ENCOUNTER — Other Ambulatory Visit (INDEPENDENT_AMBULATORY_CARE_PROVIDER_SITE_OTHER): Payer: 59

## 2020-03-27 DIAGNOSIS — D72819 Decreased white blood cell count, unspecified: Secondary | ICD-10-CM | POA: Diagnosis not present

## 2020-03-27 LAB — CBC WITH DIFFERENTIAL/PLATELET
Basophils Absolute: 0 10*3/uL (ref 0.0–0.1)
Basophils Relative: 1.1 % (ref 0.0–3.0)
Eosinophils Absolute: 0 10*3/uL (ref 0.0–0.7)
Eosinophils Relative: 0.9 % (ref 0.0–5.0)
HCT: 42.8 % (ref 36.0–46.0)
Hemoglobin: 14.3 g/dL (ref 12.0–15.0)
Lymphocytes Relative: 49.4 % — ABNORMAL HIGH (ref 12.0–46.0)
Lymphs Abs: 1.4 10*3/uL (ref 0.7–4.0)
MCHC: 33.3 g/dL (ref 30.0–36.0)
MCV: 91.4 fl (ref 78.0–100.0)
Monocytes Absolute: 0.4 10*3/uL (ref 0.1–1.0)
Monocytes Relative: 13.3 % — ABNORMAL HIGH (ref 3.0–12.0)
Neutro Abs: 1 10*3/uL — ABNORMAL LOW (ref 1.4–7.7)
Neutrophils Relative %: 35.3 % — ABNORMAL LOW (ref 43.0–77.0)
Platelets: 228 10*3/uL (ref 150.0–400.0)
RBC: 4.69 Mil/uL (ref 3.87–5.11)
RDW: 14.2 % (ref 11.5–15.5)
WBC: 2.8 10*3/uL — ABNORMAL LOW (ref 4.0–10.5)

## 2020-04-16 ENCOUNTER — Telehealth: Payer: Self-pay | Admitting: Internal Medicine

## 2020-04-16 NOTE — Telephone Encounter (Signed)
-----   Message from Cammie Sickle, MD sent at 04/16/2020  8:18 PM EST ----- Regarding: RE: question Sorry for the late response. I just returned from vacation.  Pt has had similar lower counts in the past. As long she is asymptomatic, I would not any further work up; and monitor for now.   GB ----- Message ----- From: Einar Pheasant, MD Sent: 03/31/2020   7:39 AM EST To: Cammie Sickle, MD Subject: question                                       You have seen Ms Nored previously for leukopenia/neutrapenia. (last seen 2018).  Her counts had been relatively stable at that time and you suggested to monitor.  Her white blood cell count recently 2.8 with ANC [phone].  Anything more that I need to do or do you recommend to continue to monitor?  Thank you for your help.    Hope you had a nice Christmas Orva Riles

## 2020-06-06 ENCOUNTER — Other Ambulatory Visit: Payer: Self-pay | Admitting: Nurse Practitioner

## 2020-06-18 ENCOUNTER — Telehealth: Payer: Self-pay | Admitting: Internal Medicine

## 2020-06-18 DIAGNOSIS — E7800 Pure hypercholesterolemia, unspecified: Secondary | ICD-10-CM

## 2020-06-18 NOTE — Telephone Encounter (Signed)
Called to reschedule pt appt and she would like to have lab orders placed for the hospital to be drawn on Friday. Please let me know when these are order so I can call her back

## 2020-06-19 ENCOUNTER — Other Ambulatory Visit: Payer: Self-pay | Admitting: Internal Medicine

## 2020-06-19 DIAGNOSIS — D72819 Decreased white blood cell count, unspecified: Secondary | ICD-10-CM

## 2020-06-19 NOTE — Addendum Note (Signed)
Addended by: Lars Masson on: 06/19/2020 03:04 PM   Modules accepted: Orders

## 2020-06-19 NOTE — Telephone Encounter (Signed)
[facility]. Will place orders for fasting labs. Pt is aware.

## 2020-06-19 NOTE — Telephone Encounter (Signed)
Orders placed.

## 2020-06-19 NOTE — Progress Notes (Signed)
Order placed for f/u lab.   

## 2020-06-20 ENCOUNTER — Other Ambulatory Visit
Admission: RE | Admit: 2020-06-20 | Discharge: 2020-06-20 | Disposition: A | Discharge status: Home / Self Care | Payer: 59 | Attending: Internal Medicine | Admitting: Internal Medicine

## 2020-06-20 ENCOUNTER — Other Ambulatory Visit: Payer: Self-pay

## 2020-06-20 DIAGNOSIS — D72819 Decreased white blood cell count, unspecified: Secondary | ICD-10-CM | POA: Insufficient documentation

## 2020-06-20 DIAGNOSIS — E7800 Pure hypercholesterolemia, unspecified: Secondary | ICD-10-CM | POA: Diagnosis present

## 2020-06-20 LAB — BASIC METABOLIC PANEL
Anion gap: 8 (ref 5–15)
BUN: 7 mg/dL (ref 6–20)
CO2: 26 mmol/L (ref 22–32)
Calcium: 9.2 mg/dL (ref 8.9–10.3)
Chloride: 105 mmol/L (ref 98–111)
Creatinine, Ser: 0.71 mg/dL (ref 0.44–1.00)
GFR, Estimated: >60 mL/min (ref >60)
Glucose, Bld: 104 mg/dL — ABNORMAL HIGH (ref 70–99)
Potassium: 3.7 mmol/L (ref 3.5–5.1)
Sodium: 139 mmol/L (ref 135–145)

## 2020-06-20 LAB — HEPATIC FUNCTION PANEL
ALT: 17 U/L (ref 0–44)
AST: 18 U/L (ref 15–41)
Albumin: 4.5 g/dL (ref 3.5–5.0)
Alkaline Phosphatase: 61 U/L (ref 38–126)
Bilirubin, Direct: 0.1 mg/dL (ref 0.0–0.2)
Indirect Bilirubin: 0.7 mg/dL (ref 0.3–0.9)
Total Bilirubin: 0.8 mg/dL (ref 0.3–1.2)
Total Protein: 7.2 g/dL (ref 6.5–8.1)

## 2020-06-20 LAB — CBC WITH DIFFERENTIAL/PLATELET
Abs Immature Granulocytes: 0 10*3/uL (ref 0.00–0.07)
Basophils Absolute: 0 10*3/uL (ref 0.0–0.1)
Basophils Relative: 1 %
Eosinophils Absolute: 0 10*3/uL (ref 0.0–0.5)
Eosinophils Relative: 1 %
HCT: 42.7 % (ref 36.0–46.0)
Hemoglobin: 14.3 g/dL (ref 12.0–15.0)
Immature Granulocytes: 0 %
Lymphocytes Relative: 58 %
Lymphs Abs: 1.7 10*3/uL (ref 0.7–4.0)
MCH: 30.4 pg (ref 26.0–34.0)
MCHC: 33.5 g/dL (ref 30.0–36.0)
MCV: 90.9 fL (ref 80.0–100.0)
Monocytes Absolute: 0.3 10*3/uL (ref 0.1–1.0)
Monocytes Relative: 10 %
Neutro Abs: 0.9 10*3/uL — ABNORMAL LOW (ref 1.7–7.7)
Neutrophils Relative %: 30 %
Platelets: 229 10*3/uL (ref 150–400)
RBC: 4.7 MIL/uL (ref 3.87–5.11)
RDW: 13.2 % (ref 11.5–15.5)
Smear Review: NORMAL
WBC: 2.9 10*3/uL — ABNORMAL LOW (ref 4.0–10.5)
nRBC: 0 % (ref 0.0–0.2)

## 2020-06-20 LAB — LIPID PANEL
Cholesterol: 218 mg/dL — ABNORMAL HIGH (ref 0–200)
HDL: 83 mg/dL (ref >40)
LDL Cholesterol: 124 mg/dL — ABNORMAL HIGH (ref 0–99)
Total CHOL/HDL Ratio: 2.6 RATIO
Triglycerides: 53 mg/dL (ref <150)
VLDL: 11 mg/dL (ref 0–40)

## 2020-06-20 LAB — PATHOLOGIST SMEAR REVIEW

## 2020-06-20 LAB — TSH: TSH: 1.217 u[IU]/mL (ref 0.350–4.500)

## 2020-06-22 ENCOUNTER — Other Ambulatory Visit: Payer: Self-pay

## 2020-06-22 ENCOUNTER — Encounter: Payer: Self-pay | Admitting: Emergency Medicine

## 2020-06-22 ENCOUNTER — Ambulatory Visit
Admission: EM | Admit: 2020-06-22 | Discharge: 2020-06-22 | Disposition: A | Discharge status: Home / Self Care | Payer: 59 | Attending: Physician Assistant | Admitting: Physician Assistant

## 2020-06-22 DIAGNOSIS — J309 Allergic rhinitis, unspecified: Secondary | ICD-10-CM | POA: Diagnosis not present

## 2020-06-22 DIAGNOSIS — J029 Acute pharyngitis, unspecified: Secondary | ICD-10-CM | POA: Diagnosis not present

## 2020-06-22 DIAGNOSIS — K219 Gastro-esophageal reflux disease without esophagitis: Secondary | ICD-10-CM | POA: Diagnosis not present

## 2020-06-22 LAB — GROUP A STREP BY PCR: Group A Strep by PCR: NOT DETECTED

## 2020-06-22 MED ORDER — LIDOCAINE VISCOUS HCL 2 % MT SOLN: 15.0000 mL | OROMUCOSAL | 0 refills | Status: AC | PRN

## 2020-06-22 NOTE — ED Triage Notes (Signed)
Patient in today c/o sore throat off & on x 1 day. Patient denies fever. Patient has not taken any OTC medications for her symptoms. Patient has had the covid vaccines and the booster.

## 2020-06-22 NOTE — ED Provider Notes (Signed)
[facility]    CSN: [id number] Arrival date & time: 06/22/20  1453      History   Chief Complaint Chief Complaint  Patient presents with  . Sore Throat    HPI OKIE JANSSON is a 55 y.o. female presenting for sore throat since yesterday.  She denies any other associated symptoms but says that it does hurt when she swallows and she feels like her chest is "burning."  Patient denies any fever, fatigue, nasal congestion/runny nose, cough, breathing difficulty, abdominal pain, nausea/vomiting or diarrhea.  Patient does have history of asthma but says she does not use a daily inhaler.  She also has history of allergies and acid reflux.  Takes Pepcid for her reflux.  Denies any sick contacts and no known exposure to COVID-19.  Declines any Covid testing at the clinic and states that she will take 1 at home if she needs to.  She is fully vaccinated for COVID-19.  No other concerns today.  HPI  Past Medical History:  Diagnosis Date  . Allergy   . Anemia   . Arthritis    knees - no meds  . Asthma   . Colon polyps   . GERD (gastroesophageal reflux disease) 10/12/10   EGD, positive H. pylori  . HSV infection    History  . Hyperlipidemia    ? no meds - diet controlled  . Post-operative nausea and vomiting   . Seasonal allergies     Patient Active Problem List   Diagnosis Date Noted  . Sciatica 02/23/2020  . Tinnitus 12/02/2019  . Belching   . Globus sensation   . Hot flashes 07/01/2019  . Loss of taste 02/05/2019  . Headache 08/28/2018  . Exposure to COVID-19 virus 08/24/2018  . Chest pain 10/30/2017  . Witnessed apneic spells 10/31/2016  . Other neutropenia ([place]) 08/10/2016  . Vertigo 07/28/2016  . Health care maintenance 11/21/2014  . History of colonic polyps 11/21/2014  . Dysphagia 11/19/2014  . Trochanteric bursitis of left hip 10/08/2014  . URI (upper respiratory infection) 02/10/2014  . Pelvic pain in female 12/25/2013  . Personal history of ovarian  cyst 12/25/2013  . Change in bowel movement 11/11/2013  . breast tenderness 09/29/2013  . Dysphagia, unspecified(787.20) 06/12/2012  . Barrett's esophagus 06/12/2012  . Environmental allergies 02/13/2012  . Enlarged thyroid 02/13/2012  . GERD (gastroesophageal reflux disease) 02/08/2012  . Leukopenia 02/08/2012  . Fatigue 02/08/2012  . Hypercholesterolemia 02/08/2012    Past Surgical History:  Procedure Laterality Date  . [place] STUDY N/A 07/25/2019   Procedure: [place] STUDY;  Surgeon: Lavena Bullion, DO;  Location: [facility];  Service: Gastroenterology;  Laterality: N/A;  . ABDOMINAL HYSTERECTOMY    . BALLOON DILATION N/A 07/24/2012   Procedure: BALLOON DILATION;  Surgeon: Inda Castle, MD;  Location: [facility];  Service: Endoscopy;  Laterality: N/A;  . BRAVO [place] STUDY N/A 07/24/2012   Procedure: BRAVO [place] STUDY;  Surgeon: Inda Castle, MD;  Location: [facility];  Service: Endoscopy;  Laterality: N/A;  . DILATION AND CURETTAGE OF UTERUS     SAB  . ESOPHAGEAL MANOMETRY N/A 07/25/2019   Procedure: ESOPHAGEAL MANOMETRY (EM);  Surgeon: Lavena Bullion, DO;  Location: [facility];  Service: Gastroenterology;  Laterality: N/A;  . ESOPHAGOGASTRODUODENOSCOPY N/A 07/24/2012   Procedure: ESOPHAGOGASTRODUODENOSCOPY (EGD);  Surgeon: Inda Castle, MD;  Location: [facility];  Service: Endoscopy;  Laterality: N/A;  . OVARIAN CYST REMOVAL  2004  laparotomy -left  . ROBOTIC ASSISTED LAPAROSCOPIC LYSIS OF ADHESION N/A 03/21/2014   Procedure: ROBOTIC ASSISTED LAPAROSCOPIC EXTENSIVE LYSIS OF ADHESION (1 Hour);  Surgeon: Marvene Staff, MD;  Location: [facility];  Service: Gynecology;  Laterality: N/A;  . ROBOTIC ASSISTED SALPINGO OOPHERECTOMY Left 03/21/2014   Procedure:  ROBOTIC ASSISTED LEFT OOPHORECTOMY;  Surgeon: Marvene Staff, MD;  Location: [facility];  Service: Gynecology;  Laterality: Left;  . rotator cuff surgery  2017  . SHOULDER ARTHROSCOPY Left   .  TENNIS ELBOW RELEASE/NIRSCHEL PROCEDURE Right 05/01/2015   Procedure: RIGHT ELBOW DEBRIDEMENT AND TENDON REPAIR;  Surgeon: [name], MD;  Location: [facility];  Service: Orthopedics;  Laterality: Right;  . WISDOM TOOTH EXTRACTION      OB History   No obstetric history on file.      Home Medications    Prior to Admission medications   Medication Sig Start Date End Date Taking? Authorizing Provider  acyclovir (ZOVIRAX) 400 MG tablet TAKE 1 TABLET (400 MG TOTAL) BY MOUTH 2 (TWO) TIMES DAILY AS NEEDED. 12/11/19  Yes Einar Pheasant, MD  Calcium Carbonate-Vitamin D 600-400 MG-UNIT tablet Take 1 tablet by mouth daily.   Yes [provider]  diphenhydrAMINE (BENADRYL) 12.5 MG/5ML elixir Take by mouth as needed.   Yes [provider]  famotidine (PEPCID) 20 MG tablet TAKE 1 TABLET BY MOUTH TWICE A DAY 06/06/20  Yes Willia Craze, NP  ferrous sulfate 325 (65 FE) MG tablet Take 325 mg by mouth daily.    Yes [provider]  Fluocinolone Acetonide Body 0.01 % OIL Apply to scalp before shampooing. Use for 6-10 hours with the cap each week to every other week. 08/19/16  Yes [provider]  lidocaine (XYLOCAINE) 2 % solution Use as directed 15 mLs in the mouth or throat every 3 (three) hours as needed for up to 5 days for mouth pain (swish and spit). 06/22/20 06/27/20 Yes Laurene Footman B, PA-C  mometasone (NASONEX) 50 MCG/ACT nasal spray Place 2 sprays into the nose daily. 02/22/20  Yes Einar Pheasant, MD  vitamin E 400 UNIT capsule Take 400 Units by mouth daily. Reported on 10/09/2015   Yes [provider]  nortriptyline (PAMELOR) 25 MG capsule Take 1 capsule (25 mg total) by mouth at bedtime. 02/19/20   Willia Craze, NP  sucralfate (CARAFATE) 1 g tablet Take 1 tablet (1 g total) by mouth 2 (two) times daily. Patient not taking: No sig reported 06/27/19   Cirigliano, Dominic Pea, DO    Family History Family History  Problem Relation Age  of Onset  . Arthritis Mother   . Stroke Mother   . Hypertension Mother   . Heart failure Mother   . Dementia Mother   . Breast cancer Maternal Aunt   . Prostate cancer Maternal Uncle        x 2  . Diabetes Sister   . Diabetes Brother        x 2  . Diabetes Paternal Grandmother   . Hypertension Other   . Hyperlipidemia Other   . Diabetes Paternal Grandfather   . Lung cancer Father   . Diabetes Maternal Uncle   . Throat cancer Maternal Aunt        Smoker  . Esophageal cancer Maternal Aunt   . Colon cancer Neg Hx   . Stomach cancer Neg Hx   . Rectal cancer Neg Hx     Social History Social History  Tobacco Use  . Smoking status: Never Smoker  . Smokeless tobacco: Never Used  Vaping Use  . Vaping Use: Never used  Substance Use Topics  . Alcohol use: Yes    Alcohol/week: 0.0 standard drinks    Comment: occassional  . Drug use: No     Allergies   Adhesive [tape], Aspirin, Ibuprofen, Penicillins, Nsaids, Oxycodone, and Shrimp [shellfish allergy]   Review of Systems Review of Systems  Constitutional: Negative for chills, diaphoresis, fatigue and fever.  HENT: Positive for sore throat. Negative for congestion, ear pain, rhinorrhea, sinus pressure and sinus pain.   Respiratory: Negative for cough and shortness of breath.   Gastrointestinal: Negative for abdominal pain, nausea and vomiting.  Musculoskeletal: Negative for arthralgias and myalgias.  Skin: Negative for rash.  Neurological: Negative for weakness and headaches.  Hematological: Negative for adenopathy.     Physical Exam Triage Vital Signs ED Triage Vitals  Enc Vitals Group     BP 06/22/20 1512 (!) 141/97     Pulse Rate 06/22/20 1512 82     Resp 06/22/20 1512 18     Temp 06/22/20 1512 97.9 F (36.6 C)     Temp Source 06/22/20 1512 Oral     SpO2 06/22/20 1512 100 %     Weight 06/22/20 1513 188 lb (85.3 kg)     Height 06/22/20 1513 5' 5.5" (1.664 m)     Head Circumference --      Peak Flow --       Pain Score 06/22/20 1513 7     Pain Loc --      Pain Edu? --      Excl. in [place]? --    No data found.  Updated Vital Signs BP (!) 141/97 (BP Location: Left Arm)   Pulse 82   Temp 97.9 F (36.6 C) (Oral)   Resp 18   Ht 5' 5.5" (1.664 m)   Wt 188 lb (85.3 kg)   LMP 10/06/2011   SpO2 100%   BMI 30.81 kg/m       Physical Exam Vitals and nursing note reviewed.  Constitutional:      General: She is not in acute distress.    Appearance: Normal appearance. She is well-developed. She is not ill-appearing or toxic-appearing.  HENT:     Head: Normocephalic and atraumatic.     Nose: Congestion present. No rhinorrhea.     Mouth/Throat:     Mouth: Mucous membranes are moist.     Pharynx: Oropharynx is clear. Posterior oropharyngeal erythema (mild with injection of posterior pharynx) present.  Eyes:     General: No scleral icterus.       Right eye: No discharge.        Left eye: No discharge.     Conjunctiva/sclera: Conjunctivae normal.  Cardiovascular:     Rate and Rhythm: Normal rate and regular rhythm.     Heart sounds: Normal heart sounds.  Pulmonary:     Effort: Pulmonary effort is normal. No respiratory distress.     Breath sounds: Normal breath sounds.  Musculoskeletal:     Cervical back: Neck supple.  Lymphadenopathy:     Cervical: No cervical adenopathy.  Skin:    General: Skin is dry.  Neurological:     General: No focal deficit present.     Mental Status: She is alert. Mental status is at baseline.     Motor: No weakness.     Gait: Gait normal.  Psychiatric:  Mood and Affect: Mood normal.        Behavior: Behavior normal.        Thought Content: Thought content normal.      UC Treatments / Results  Labs (all labs ordered are listed, but only abnormal results are displayed) Labs Reviewed  GROUP A STREP BY PCR    EKG   Radiology No results found.  Procedures Procedures (including critical care time)  Medications Ordered in UC Medications  - No data to display  Initial Impression / Assessment and Plan / UC Course  I have reviewed the triage vital signs and the nursing notes.  Pertinent labs & imaging results that were available during my care of the patient were reviewed by me and considered in my medical decision making (see chart for details).   55 year old female presenting for sore throat since yesterday.  No other associated symptoms.  All vital signs are stable clinic.  She is afebrile.  Overall well-appearing.  Exam significant for mild posterior pharyngeal erythema and injection as well as nasal mucosal edema.  Her chest is clear auscultation heart regular rate and rhythm.  Molecular strep test negative.  Reviewed results with patient.  She declines any Covid test.  Advised her to take a test at home and reviewed CDC guidelines, isolation protocol and ED precautions for COVID-19.  Advised patient her symptoms could be due to allergies since she does have nasal congestion.  Advised her to start a daily antihistamine such as Claritin or Zyrtec and continue to use her Flonase.  Advised she can also do nasal saline.  I did send Viscous Lidocaine for the sore throat.  Additionally we discussed the possibility of her acid reflux causing her symptoms since she has some "chest burning."  Advised her to continue with Pepcid and avoiding triggers.  Follow-up with our department as needed.   Final Clinical Impressions(s) / UC Diagnoses   Final diagnoses:  Sore throat  Allergic rhinitis, unspecified seasonality, unspecified trigger  Gastroesophageal reflux disease, unspecified whether esophagitis present     Discharge Instructions     Strep test negative.  As discussed your sore throat may be due to your allergies since you are nose is congested.  Take daily Zyrtec or Claritin and continue using your Flonase.  You can also use Afrin for couple days if needed and nasal saline.   Additionally, acid reflux can cause sore throat  sometimes.  Continue with your acid reflux medication and avoid triggers.  Follow-up with [place] as needed.    Take an at home COVID test since you did not want one here today.  If positive you need to isolate 5 days in our mass x5 days.  Go to ED for any severe symptoms.    ED Prescriptions    Medication Sig Dispense Auth. Provider   lidocaine (XYLOCAINE) 2 % solution Use as directed 15 mLs in the mouth or throat every 3 (three) hours as needed for up to 5 days for mouth pain (swish and spit). 100 mL D[place] Clap, PA-C     PDMP not reviewed this encounter.   Danton Clap, PA-C 06/22/20 1610

## 2020-06-22 NOTE — Discharge Instructions (Addendum)
Strep test negative.  As discussed your sore throat may be due to your allergies since you are nose is congested.  Take daily Zyrtec or Claritin and continue using your Flonase.  You can also use Afrin for couple days if needed and nasal saline.   Additionally, acid reflux can cause sore throat sometimes.  Continue with your acid reflux medication and avoid triggers.  Follow-up with [place] as needed.    Take an at home COVID test since you did not want one here today.  If positive you need to isolate 5 days in our mass x5 days.  Go to ED for any severe symptoms.

## 2020-06-27 ENCOUNTER — Ambulatory Visit: Payer: 59 | Admitting: Internal Medicine

## 2020-07-14 ENCOUNTER — Ambulatory Visit (INDEPENDENT_AMBULATORY_CARE_PROVIDER_SITE_OTHER): Payer: 59

## 2020-07-14 ENCOUNTER — Ambulatory Visit (INDEPENDENT_AMBULATORY_CARE_PROVIDER_SITE_OTHER): Payer: 59 | Admitting: Podiatry

## 2020-07-14 ENCOUNTER — Other Ambulatory Visit: Payer: Self-pay

## 2020-07-14 DIAGNOSIS — M7750 Other enthesopathy of unspecified foot: Secondary | ICD-10-CM

## 2020-07-14 DIAGNOSIS — M7752 Other enthesopathy of left foot: Secondary | ICD-10-CM | POA: Diagnosis not present

## 2020-07-14 DIAGNOSIS — M7751 Other enthesopathy of right foot: Secondary | ICD-10-CM | POA: Diagnosis not present

## 2020-07-14 DIAGNOSIS — M722 Plantar fascial fibromatosis: Secondary | ICD-10-CM

## 2020-07-14 MED ORDER — DEXAMETHASONE SODIUM PHOSPHATE 4 MG/ML IJ SOLN
4.0000 mg | Freq: Once | INTRAMUSCULAR | Status: AC
Start: 2020-07-14 — End: 2020-07-15
  Administered 2020-07-15: 4 mg

## 2020-07-14 MED ORDER — TRIAMCINOLONE ACETONIDE 40 MG/ML IJ SUSP
10.0000 mg | Freq: Once | INTRAMUSCULAR | Status: AC
  Administered 2020-07-15: 10 mg

## 2020-07-15 ENCOUNTER — Encounter: Payer: Self-pay | Admitting: Podiatry

## 2020-07-15 ENCOUNTER — Telehealth: Payer: Self-pay | Admitting: Podiatry

## 2020-07-15 DIAGNOSIS — M722 Plantar fascial fibromatosis: Secondary | ICD-10-CM | POA: Diagnosis not present

## 2020-07-15 MED ORDER — METHYLPREDNISOLONE 4 MG PO TBPK: ORAL_TABLET | ORAL | 0 refills | Status: DC

## 2020-07-15 NOTE — Telephone Encounter (Signed)
Sent - thank you

## 2020-07-15 NOTE — Telephone Encounter (Signed)
Patient called and stated that Dr. Sherryle Lis was suppose to send over Prednisone. She went to the pharmacy and it was not avail.

## 2020-07-15 NOTE — Progress Notes (Signed)
  Subjective:  Patient ID: Kelly Massey, female    DOB: 07/24/1965,  MRN: [id number]  Chief Complaint  Patient presents with  . Foot Pain     (xray)(NP)painful sharp pain-left foot/bottom-side    55 y.o. female presents with the above complaint. History confirmed with patient.  This is worsened very sharp on the left side, although both sides bother her.  She had quite a bit of cramping on Saturday after working in the yard.  Objective:  Physical Exam: warm, good capillary refill, no trophic changes or ulcerative lesions, normal DP and PT pulses and normal sensory exam. Left Foot: point tenderness over the heel pad  Right Foot: point tenderness over the heel pad, less intense than left    Radiographs: X-ray of both feet: no fracture, dislocation, swelling or degenerative changes noted, she has a plantar cutaneous spur on the left side a Assessment:   1. Plantar fasciitis, left   2. Plantar fasciitis of right foot      Plan:  Patient was evaluated and treated and all questions answered.  Discussed the etiology and treatment options for plantar fasciitis including stretching, formal physical therapy, supportive shoegears such as a running shoe or sneaker, pre fabricated orthoses, injection therapy, and oral medications. We also discussed the role of surgical treatment of this for patients who do not improve after exhausting non-surgical treatment options.   Plantar Fasciitis -XR reviewed with patient -Educated patient on stretching and icing of the affected limb -Night splint dispensed -Plantar fascial brace dispensed -Injection delivered to the plantar fascia of the right foot. -Rx for methylprednisolone Dosepak. Educated on use, risks and benefits of the medication  After sterile prep with povidone-iodine solution and alcohol, the left heel was injected with  1cc 0.5% marcaine plain, 10mg  triamcinolone acetonide, and 4mg  dexamethasone was injected along  the plantar fascia  at the insertion on the plantar calcaneus. The patient tolerated the procedure well without complication.  No follow-ups on file.

## 2020-07-16 NOTE — Telephone Encounter (Signed)
Informed patient

## 2020-07-22 ENCOUNTER — Other Ambulatory Visit: Payer: Self-pay

## 2020-07-22 ENCOUNTER — Telehealth (INDEPENDENT_AMBULATORY_CARE_PROVIDER_SITE_OTHER): Payer: 59 | Admitting: Internal Medicine

## 2020-07-22 ENCOUNTER — Encounter: Payer: Self-pay | Admitting: Internal Medicine

## 2020-07-22 DIAGNOSIS — E7800 Pure hypercholesterolemia, unspecified: Secondary | ICD-10-CM

## 2020-07-22 DIAGNOSIS — Z9109 Other allergy status, other than to drugs and biological substances: Secondary | ICD-10-CM

## 2020-07-22 DIAGNOSIS — K2270 Barrett's esophagus without dysplasia: Secondary | ICD-10-CM | POA: Diagnosis not present

## 2020-07-22 DIAGNOSIS — R739 Hyperglycemia, unspecified: Secondary | ICD-10-CM

## 2020-07-22 DIAGNOSIS — K2100 Gastro-esophageal reflux disease with esophagitis, without bleeding: Secondary | ICD-10-CM | POA: Diagnosis not present

## 2020-07-22 DIAGNOSIS — Z86010 Personal history of colonic polyps: Secondary | ICD-10-CM

## 2020-07-22 DIAGNOSIS — D708 Other neutropenia: Secondary | ICD-10-CM | POA: Diagnosis not present

## 2020-07-22 NOTE — Progress Notes (Signed)
Patient ID: Kelly Massey, female   DOB: 1965-08-01, 55 y.o.   MRN: [id number]

## 2020-07-22 NOTE — Progress Notes (Signed)
Patient ID: Kelly Massey, female   DOB: 05/29/1965, 55 y.o.   MRN: [id number]   Virtual Visit via video Note  This visit type was conducted due to national recommendations for restrictions regarding the COVID-19 pandemic (e.g. social distancing).  This format is felt to be most appropriate for this patient at this time.  All issues noted in this document were discussed and addressed.  No physical exam was performed (except for noted visual exam findings with Video Visits).   I connected with Shaylynn Nulty by a video enabled telemedicine application and verified that I am speaking with the correct person using two identifiers. Location patient: home Location provider:  home office Persons participating in the virtual visit: patient, provider  The limitations, risks, security and privacy concerns of performing an evaluation and management service by video and the availability of in person appointments have been discussed.  It has also been discussed with the patient that there may be a patient responsible charge related to this service. The patient expressed understanding and agreed to proceed.   Reason for visit: scheduled follow up.   HPI: She was recently seen at Urgent Care for sore throat. Strep test - negative.  Was instructed to take antihistamine and use steroid nasal spray.  She reports she has intermittent flares - where her throat will feel swollen and is red.  Feels inflamed.  No specific triggers.  She is taking pepcid bid.  Will use mylanta prn.  May occur 2-3x/week.  Is eating. Does not feel food is getting stuck.  Did have an episode recently where she was working out in her yard, bent over and felt - acid coming up.  Throat hurt.  Has also been taking elderberry syrup.  Discussed continuing to take antihistamines and mucinex. She has discussed with GI.  Has appt 08/01/20 to discuss f/u colonoscopy and with above symptoms, discuss EGD.  No chest pain or sob reported.  No abdominal  pain.  Bowels moving.  Discussed lab - slightly elevated sugar. Has been watching her diet.     ROS: See pertinent positives and negatives per HPI.  Past Medical History:  Diagnosis Date  . Allergy   . Anemia   . Arthritis    knees - no meds  . Asthma   . Colon polyps   . GERD (gastroesophageal reflux disease) 10/12/10   EGD, positive H. pylori  . HSV infection    History  . Hyperlipidemia    ? no meds - diet controlled  . Post-operative nausea and vomiting   . Seasonal allergies     Past Surgical History:  Procedure Laterality Date  . [place] STUDY N/A 07/25/2019   Procedure: [place] STUDY;  Surgeon: Lavena Bullion, DO;  Location: [facility];  Service: Gastroenterology;  Laterality: N/A;  . ABDOMINAL HYSTERECTOMY    . BALLOON DILATION N/A 07/24/2012   Procedure: BALLOON DILATION;  Surgeon: Inda Castle, MD;  Location: [facility];  Service: Endoscopy;  Laterality: N/A;  . BRAVO [place] STUDY N/A 07/24/2012   Procedure: BRAVO [place] STUDY;  Surgeon: Inda Castle, MD;  [facility]: [facility];  Service: Endoscopy;  Laterality: N/A;  . DILATION AND CURETTAGE OF UTERUS     SAB  . ESOPHAGEAL MANOMETRY N/A 07/25/2019   Procedure: ESOPHAGEAL MANOMETRY (EM);  Surgeon: Lavena Bullion, DO;  [facility];  Service: Gastroenterology;  Laterality: N/A;  . ESOPHAGOGASTRODUODENOSCOPY N/A 07/24/2012   Procedure: ESOPHAGOGASTRODUODENOSCOPY (EGD);  Surgeon: Sandy Salaam  Deatra Ina, MD;  Location: [facility];  Service: Endoscopy;  Laterality: N/A;  . OVARIAN CYST REMOVAL  2004   laparotomy -left  . ROBOTIC ASSISTED LAPAROSCOPIC LYSIS OF ADHESION N/A 03/21/2014   Procedure: ROBOTIC ASSISTED LAPAROSCOPIC EXTENSIVE LYSIS OF ADHESION (1 Hour);  Surgeon: Marvene Staff, MD;  Location: [facility];  Service: Gynecology;  Laterality: N/A;  . ROBOTIC ASSISTED SALPINGO OOPHERECTOMY Left 03/21/2014   Procedure:  ROBOTIC ASSISTED LEFT OOPHORECTOMY;  Surgeon: Marvene Staff, MD;   Location: [facility];  Service: Gynecology;  Laterality: Left;  . rotator cuff surgery  2017  . SHOULDER ARTHROSCOPY Left   . TENNIS ELBOW RELEASE/NIRSCHEL PROCEDURE Right 05/01/2015   Procedure: RIGHT ELBOW DEBRIDEMENT AND TENDON REPAIR;  Surgeon: [name], MD;  Location: [facility];  Service: Orthopedics;  Laterality: Right;  . WISDOM TOOTH EXTRACTION      Family History  Problem Relation Age of Onset  . Arthritis Mother   . Stroke Mother   . Hypertension Mother   . Heart failure Mother   . Dementia Mother   . Breast cancer Maternal Aunt   . Prostate cancer Maternal Uncle        x 2  . Diabetes Sister   . Diabetes Brother        x 2  . Diabetes Paternal Grandmother   . Hypertension Other   . Hyperlipidemia Other   . Diabetes Paternal Grandfather   . Lung cancer Father   . Diabetes Maternal Uncle   . Throat cancer Maternal Aunt        Smoker  . Esophageal cancer Maternal Aunt   . Colon cancer Neg Hx   . Stomach cancer Neg Hx   . Rectal cancer Neg Hx     SOCIAL HX: reviewed.    Current Outpatient Medications:  .  acyclovir (ZOVIRAX) 400 MG tablet, TAKE 1 TABLET (400 MG TOTAL) BY MOUTH 2 (TWO) TIMES DAILY AS NEEDED., Disp: 60 tablet, Rfl: 1 .  Calcium Carbonate-Vitamin D 600-400 MG-UNIT tablet, Take 1 tablet by mouth daily., Disp: , Rfl:  .  diphenhydrAMINE (BENADRYL) 12.5 MG/5ML elixir, Take by mouth as needed., Disp: , Rfl:  .  famotidine (PEPCID) 20 MG tablet, TAKE 1 TABLET BY MOUTH TWICE A DAY, Disp: 60 tablet, Rfl: 2 .  ferrous sulfate 325 (65 FE) MG tablet, Take 325 mg by mouth daily. , Disp: , Rfl:  .  Fluocinolone Acetonide Body 0.01 % OIL, Apply to scalp before shampooing. Use for 6-10 hours with the cap each week to every other week., Disp: , Rfl:  .  mometasone (NASONEX) 50 MCG/ACT nasal spray, Place 2 sprays into the nose daily., Disp: 51 g, Rfl: 1 .  vitamin E 400 UNIT capsule, Take 400 Units by mouth daily. Reported on 10/09/2015, Disp: , Rfl:    Current Facility-Administered Medications:  .  0.9 %  sodium chloride infusion, 500 mL, Intravenous, Once, Armbruster, Carlota Raspberry, MD  EXAM:  GENERAL: alert, oriented, appears well and in no acute distress  HEENT: atraumatic, conjunttiva clear, no obvious abnormalities on inspection of external nose and ears  NECK: normal movements of the head and neck  LUNGS: on inspection no signs of respiratory distress, breathing rate appears normal, no obvious gross SOB, gasping or wheezing  CV: no obvious cyanosis  PSYCH/NEURO: pleasant and cooperative, no obvious depression or anxiety, speech and thought processing grossly intact  ASSESSMENT AND PLAN:  Discussed the following assessment and plan:  Problem List Items  Addressed This Visit    Barrett's esophagus    Has been evaluated by GI.  With ongoing intermittent symptoms, agree with f/u EGD.  Continue pepcid.       Environmental allergies    Continue antihistamine and nasal sprays.  Follow.       GERD (gastroesophageal reflux disease)    Persistent issues with throat burning, inflammation as outlined.  Unclear etiology.  Continue pepcid and taking mylanta as needed.  Avoid foods and possible triggers.  Avoid eating late.  Agree with GI f/u and plans for EGD.  Will continue treating allergies as well.  Continue antihistamine and nasal sprays.  If GI evaluation does not provide answers, discussed referral to ENT.        History of colonic polyps    Colonoscopy 2012. Recommended f/u in 10 years.  Due.  Has appt with GI 08/01/20.        Hypercholesterolemia    The 10-year ASCVD risk score Mikey Bussing DC Brooke Bonito., et al., 2013) is: 3.3%   Values used to calculate the score:     Age: 54 years     Sex: Female     Is Non-Hispanic African American: Yes     Diabetic: No     Tobacco smoker: No     Systolic Blood Pressure: 612 mmHg     Is BP treated: No     HDL Cholesterol: 83 mg/dL     Total Cholesterol: 218 mg/dL  Continue low cholesterol diet  and exercise.  Follow lipid panel.       Relevant Orders   CBC with Differential/Platelet   Hepatic function panel   Lipid panel   Basic metabolic panel   Hyperglycemia    Watching her diet.  Low carb diet and exercise.  Follow met b and a1c.       Relevant Orders   Hemoglobin A1c   Other neutropenia (HCC)    Has been worked up by hematology.  Felt related to PPI.  Follow cbc.            I discussed the assessment and treatment plan with the patient. The patient was provided an opportunity to ask questions and all were answered. The patient agreed with the plan and demonstrated an understanding of the instructions.   The patient was advised to call back or seek an in-person evaluation if the symptoms worsen or if the condition fails to improve as anticipated.    Einar Pheasant, MD

## 2020-07-23 ENCOUNTER — Encounter: Payer: Self-pay | Admitting: Internal Medicine

## 2020-07-23 DIAGNOSIS — R739 Hyperglycemia, unspecified: Secondary | ICD-10-CM | POA: Insufficient documentation

## 2020-07-23 NOTE — Assessment & Plan Note (Signed)
Has been evaluated by GI.  With ongoing intermittent symptoms, agree with f/u EGD.  Continue pepcid.

## 2020-07-23 NOTE — Assessment & Plan Note (Signed)
Persistent issues with throat burning, inflammation as outlined.  Unclear etiology.  Continue pepcid and taking mylanta as needed.  Avoid foods and possible triggers.  Avoid eating late.  Agree with GI f/u and plans for EGD.  Will continue treating allergies as well.  Continue antihistamine and nasal sprays.  If GI evaluation does not provide answers, discussed referral to ENT.

## 2020-07-23 NOTE — Assessment & Plan Note (Signed)
Continue antihistamine and nasal sprays.  Follow.

## 2020-07-23 NOTE — Assessment & Plan Note (Signed)
Watching her diet.  Low carb diet and exercise.  Follow met b and a1c.  ?

## 2020-07-23 NOTE — Assessment & Plan Note (Signed)
The 10-year ASCVD risk score Mikey Bussing DC Brooke Bonito., et al., 2013) is: 3.3%   Values used to calculate the score:     Age: 55 years     Sex: Female     Is Non-Hispanic African American: Yes     Diabetic: No     Tobacco smoker: No     Systolic Blood Pressure: 357 mmHg     Is BP treated: No     HDL Cholesterol: 83 mg/dL     Total Cholesterol: 218 mg/dL  Continue low cholesterol diet and exercise.  Follow lipid panel.

## 2020-07-23 NOTE — Assessment & Plan Note (Signed)
Colonoscopy 2012. Recommended f/u in 10 years.  Due.  Has appt with GI 08/01/20.

## 2020-07-23 NOTE — Assessment & Plan Note (Signed)
Has been worked up by hematology.  Felt related to PPI.  Follow cbc.

## 2020-07-27 ENCOUNTER — Other Ambulatory Visit: Payer: Self-pay

## 2020-07-27 ENCOUNTER — Ambulatory Visit
Admission: EM | Admit: 2020-07-27 | Discharge: 2020-07-27 | Disposition: A | Discharge status: Home / Self Care | Payer: 59 | Attending: Emergency Medicine | Admitting: Emergency Medicine

## 2020-07-27 ENCOUNTER — Encounter: Payer: Self-pay | Admitting: Emergency Medicine

## 2020-07-27 DIAGNOSIS — J069 Acute upper respiratory infection, unspecified: Secondary | ICD-10-CM | POA: Diagnosis not present

## 2020-07-27 DIAGNOSIS — R059 Cough, unspecified: Secondary | ICD-10-CM | POA: Diagnosis present

## 2020-07-27 DIAGNOSIS — Z880 Allergy status to penicillin: Secondary | ICD-10-CM | POA: Diagnosis not present

## 2020-07-27 DIAGNOSIS — Z20822 Contact with and (suspected) exposure to covid-19: Secondary | ICD-10-CM | POA: Insufficient documentation

## 2020-07-27 DIAGNOSIS — J029 Acute pharyngitis, unspecified: Secondary | ICD-10-CM | POA: Insufficient documentation

## 2020-07-27 LAB — GROUP A STREP BY PCR: Group A Strep by PCR: NOT DETECTED

## 2020-07-27 MED ORDER — IPRATROPIUM BROMIDE 0.06 % NA SOLN: 2.0000 | Freq: Four times a day (QID) | NASAL | 12 refills | Status: DC

## 2020-07-27 MED ORDER — BENZONATATE 100 MG PO CAPS: 200.0000 mg | ORAL_CAPSULE | Freq: Three times a day (TID) | ORAL | 0 refills | Status: DC

## 2020-07-27 MED ORDER — PROMETHAZINE-DM 6.25-15 MG/5ML PO SYRP: 5.0000 mL | ORAL_SOLUTION | Freq: Four times a day (QID) | ORAL | 0 refills | Status: DC | PRN

## 2020-07-27 NOTE — ED Triage Notes (Signed)
Patient c/o sore throat, cough, runny nose, and headache that started Friday night.  Patient denies fevers.

## 2020-07-27 NOTE — ED Provider Notes (Signed)
[facility]    CSN: [id number] Arrival date & time: 07/27/20  1131      History   Chief Complaint Chief Complaint  Patient presents with  . Sore Throat  . Headache  . Cough  . Nasal Congestion    HPI Kelly Massey is a 55 y.o. female.   HPI   55 year old female here for evaluation of runny nose, sore throat, headache, and cough.  Patient reports that she has had symptoms for the last 2 days.  Her runny nose has clear mucus, she is complaining of ringing in her ears but no real ear pain or pressure.  Her cough is productive for a clear to light yellow mucus in the morning and at night but resolves during the day.  Patient denies any fevers, shortness of breath, or wheezing.  Past Medical History:  Diagnosis Date  . Allergy   . Anemia   . Arthritis    knees - no meds  . Asthma   . Colon polyps   . GERD (gastroesophageal reflux disease) 10/12/10   EGD, positive H. pylori  . HSV infection    History  . Hyperlipidemia    ? no meds - diet controlled  . Post-operative nausea and vomiting   . Seasonal allergies     Patient Active Problem List   Diagnosis Date Noted  . Hyperglycemia 07/23/2020  . Sciatica 02/23/2020  . Tinnitus 12/02/2019  . Belching   . Globus sensation   . Hot flashes 07/01/2019  . Loss of taste 02/05/2019  . Headache 08/28/2018  . Exposure to COVID-19 virus 08/24/2018  . Chest pain 10/30/2017  . Witnessed apneic spells 10/31/2016  . Other neutropenia ([place]) 08/10/2016  . Vertigo 07/28/2016  . Health care maintenance 11/21/2014  . History of colonic polyps 11/21/2014  . Dysphagia 11/19/2014  . Trochanteric bursitis of left hip 10/08/2014  . URI (upper respiratory infection) 02/10/2014  . Pelvic pain in female 12/25/2013  . Personal history of ovarian cyst 12/25/2013  . Change in bowel movement 11/11/2013  . breast tenderness 09/29/2013  . Dysphagia, unspecified(787.20) 06/12/2012  . Barrett's esophagus 06/12/2012  .  Environmental allergies 02/13/2012  . Enlarged thyroid 02/13/2012  . GERD (gastroesophageal reflux disease) 02/08/2012  . Leukopenia 02/08/2012  . Fatigue 02/08/2012  . Hypercholesterolemia 02/08/2012    Past Surgical History:  Procedure Laterality Date  . [place] STUDY N/A 07/25/2019   Procedure: [place] STUDY;  Surgeon: Lavena Bullion, DO;  Location: [facility];  Service: Gastroenterology;  Laterality: N/A;  . ABDOMINAL HYSTERECTOMY    . BALLOON DILATION N/A 07/24/2012   Procedure: BALLOON DILATION;  Surgeon: Inda Castle, MD;  Location: [facility];  Service: Endoscopy;  Laterality: N/A;  . BRAVO [place] STUDY N/A 07/24/2012   Procedure: BRAVO [place] STUDY;  Surgeon: Inda Castle, MD;  Location: [facility];  Service: Endoscopy;  Laterality: N/A;  . DILATION AND CURETTAGE OF UTERUS     SAB  . ESOPHAGEAL MANOMETRY N/A 07/25/2019   Procedure: ESOPHAGEAL MANOMETRY (EM);  Surgeon: Lavena Bullion, DO;  Location: [facility];  Service: Gastroenterology;  Laterality: N/A;  . ESOPHAGOGASTRODUODENOSCOPY N/A 07/24/2012   Procedure: ESOPHAGOGASTRODUODENOSCOPY (EGD);  Surgeon: Inda Castle, MD;  Location: [facility];  Service: Endoscopy;  Laterality: N/A;  . OVARIAN CYST REMOVAL  2004   laparotomy -left  . ROBOTIC ASSISTED LAPAROSCOPIC LYSIS OF ADHESION N/A 03/21/2014   Procedure: ROBOTIC ASSISTED LAPAROSCOPIC EXTENSIVE LYSIS OF ADHESION (1 Hour);  Surgeon: Marvene Staff, MD;  Location: [facility];  Service: Gynecology;  Laterality: N/A;  . ROBOTIC ASSISTED SALPINGO OOPHERECTOMY Left 03/21/2014   Procedure:  ROBOTIC ASSISTED LEFT OOPHORECTOMY;  Surgeon: Marvene Staff, MD;  Location: [facility];  Service: Gynecology;  Laterality: Left;  . rotator cuff surgery  2017  . SHOULDER ARTHROSCOPY Left   . TENNIS ELBOW RELEASE/NIRSCHEL PROCEDURE Right 05/01/2015   Procedure: RIGHT ELBOW DEBRIDEMENT AND TENDON REPAIR;  Surgeon: [name], MD;  Location: [facility];  Service: Orthopedics;  Laterality: Right;  . WISDOM TOOTH EXTRACTION      OB History   No obstetric history on file.      Home Medications    Prior to Admission medications   Medication Sig Start Date End Date Taking? Authorizing Provider  benzonatate (TESSALON) 100 MG capsule Take 2 capsules (200 mg total) by mouth every 8 (eight) hours. 07/27/20  Yes Margarette Canada, NP  Calcium Carbonate-Vitamin D 600-400 MG-UNIT tablet Take 1 tablet by mouth daily.   Yes [provider]  famotidine (PEPCID) 20 MG tablet TAKE 1 TABLET BY MOUTH TWICE A DAY 06/06/20  Yes Willia Craze, NP  ferrous sulfate 325 (65 FE) MG tablet Take 325 mg by mouth daily.    Yes [provider]  ipratropium (ATROVENT) 0.06 % nasal spray Place 2 sprays into both nostrils 4 (four) times daily. 07/27/20  Yes Margarette Canada, NP  mometasone (NASONEX) 50 MCG/ACT nasal spray Place 2 sprays into the nose daily. 02/22/20  Yes Einar Pheasant, MD  promethazine-dextromethorphan (PROMETHAZINE-DM) 6.25-15 MG/5ML syrup Take 5 mLs by mouth 4 (four) times daily as needed. 07/27/20  Yes Margarette Canada, NP  vitamin E 400 UNIT capsule Take 400 Units by mouth daily. Reported on 10/09/2015   Yes [provider]  acyclovir (ZOVIRAX) 400 MG tablet TAKE 1 TABLET (400 MG TOTAL) BY MOUTH 2 (TWO) TIMES DAILY AS NEEDED. 12/11/19   Einar Pheasant, MD  diphenhydrAMINE (BENADRYL) 12.5 MG/5ML elixir Take by mouth as needed.    [provider]  Fluocinolone Acetonide Body 0.01 % OIL Apply to scalp before shampooing. Use for 6-10 hours with the cap each week to every other week. 08/19/16   [provider]    Family History Family History  Problem Relation Age of Onset  . Arthritis Mother   . Stroke Mother   . Hypertension Mother   . Heart failure Mother   . Dementia Mother   . Breast cancer Maternal Aunt   . Prostate cancer Maternal Uncle        x 2  . Diabetes Sister   . Diabetes Brother        x 2   . Diabetes Paternal Grandmother   . Hypertension Other   . Hyperlipidemia Other   . Diabetes Paternal Grandfather   . Lung cancer Father   . Diabetes Maternal Uncle   . Throat cancer Maternal Aunt        Smoker  . Esophageal cancer Maternal Aunt   . Colon cancer Neg Hx   . Stomach cancer Neg Hx   . Rectal cancer Neg Hx     Social History Social History   Tobacco Use  . Smoking status: Never Smoker  . Smokeless tobacco: Never Used  Vaping Use  . Vaping Use: Never used  Substance Use Topics  . Alcohol use: Yes    Alcohol/week: 0.0 standard drinks    Comment: occassional  . Drug use: No  Allergies   Adhesive [tape], Aspirin, Ibuprofen, Penicillins, Nsaids, Oxycodone, and Shrimp [shellfish allergy]   Review of Systems Review of Systems  Constitutional: Negative for activity change, appetite change and fever.  HENT: Positive for congestion, postnasal drip, rhinorrhea, sore throat and tinnitus. Negative for ear discharge and ear pain.   Respiratory: Positive for cough. Negative for shortness of breath and wheezing.   Gastrointestinal: Negative for diarrhea, nausea and vomiting.  Skin: Negative for rash.  Neurological: Positive for headaches.  Hematological: Negative.   Psychiatric/Behavioral: Negative.      Physical Exam Triage Vital Signs ED Triage Vitals  Enc Vitals Group     BP 07/27/20 1137 134/87     Pulse Rate 07/27/20 1137 79     Resp 07/27/20 1137 14     Temp 07/27/20 1137 98.2 F (36.8 C)     Temp Source 07/27/20 1137 Oral     SpO2 07/27/20 1137 97 %     Weight 07/27/20 1134 193 lb (87.5 kg)     Height 07/27/20 1134 5' 5.5" (1.664 m)     Head Circumference --      Peak Flow --      Pain Score 07/27/20 1133 10     Pain Loc --      Pain Edu? --      Excl. in [place]? --    No data found.  Updated Vital Signs BP 134/87 (BP Location: Left Arm)   Pulse 79   Temp 98.2 F (36.8 C) (Oral)   Resp 14   Ht 5' 5.5" (1.664 m)   Wt 193 lb (87.5 kg)    LMP 10/06/2011   SpO2 97%   BMI 31.63 kg/m   Visual Acuity Right Eye Distance:   Left Eye Distance:   Bilateral Distance:    Right Eye Near:   Left Eye Near:    Bilateral Near:     Physical Exam Vitals and nursing note reviewed.  Constitutional:      General: She is not in acute distress.    Appearance: She is well-developed and normal weight. She is not ill-appearing.  HENT:     Head: Normocephalic and atraumatic.     Right Ear: Tympanic membrane and ear canal normal. Tympanic membrane is not erythematous.     Left Ear: Tympanic membrane and ear canal normal. Tympanic membrane is not erythematous.     Nose: Congestion and rhinorrhea present.     Mouth/Throat:     Pharynx: Oropharynx is clear. Uvula midline. No posterior oropharyngeal erythema.     Tonsils: No tonsillar exudate. 0 on the right. 0 on the left.  Cardiovascular:     Rate and Rhythm: Normal rate and regular rhythm.     Heart sounds: Normal heart sounds. No murmur heard.   Pulmonary:     Effort: Pulmonary effort is normal.     Breath sounds: Normal breath sounds. No wheezing, rhonchi or rales.  Musculoskeletal:     Cervical back: Normal range of motion and neck supple.  Lymphadenopathy:     Cervical: No cervical adenopathy.  Skin:    General: Skin is warm and dry.     Capillary Refill: Capillary refill takes less than 2 seconds.     Findings: No erythema or rash.  Neurological:     General: No focal deficit present.     Mental Status: She is alert and oriented to person, place, and time.  Psychiatric:        Mood and Affect:  Mood normal.        Behavior: Behavior normal.      UC Treatments / Results  Labs (all labs ordered are listed, but only abnormal results are displayed) Labs Reviewed  GROUP A STREP BY PCR  SARS CORONAVIRUS 2 (TAT 6-24 HRS)    EKG   Radiology No results found.  Procedures Procedures (including critical care time)  Medications Ordered in UC Medications - No data to  display  Initial Impression / Assessment and Plan / UC Course  I have reviewed the triage vital signs and the nursing notes.  Pertinent labs & imaging results that were available during my care of the patient were reviewed by me and considered in my medical decision making (see chart for details).   Patient is a very pleasant, nontoxic-appearing 55 year old female here for evaluation of respiratory complaints have been going for last 2 days.  These include a runny nose for clear discharge, sore throat, headache, and cough that is worse in the morning and at night.  Patient reports that her sputum is mostly clear to yellow, occasionally green, and this morning she says there was a little bit of blood in her mucus.  Patient denies shortness of breath or wheezing and has been afebrile.  Physical exam reveals bilateral pearly gray tympanic membranes with a normal light reflex and clear external auditory canals.  Nasal mucosa is erythematous and edematous, L>R.  With clear nasal discharge.  Posterior oropharynx reveals no tonsillar edema, erythema, or exudate.  There is some mild posterior oropharyngeal erythema with clear postnasal drip.  No cervical lymphadenopathy on exam.  Lungs are clear to auscultation all fields.  Strep PCR and COVID collected at triage.  Strep PCR is negative.  Will discharge patient home to isolate pending the results of her COVID test.  Patient's exam is consistent with a viral URI with a cough.  We will treat her with Atrovent nasal spray, Tessalon Perles, Promethazine DM.  Salt water gargles and over-the-counter Tylenol and ibuprofen as needed for pain.   Final Clinical Impressions(s) / UC Diagnoses   Final diagnoses:  Viral URI with cough     Discharge Instructions     Isolate at home pending the results of your COVID test.  If you test positive then you will have to quarantine for 5 days from the start of your symptoms.  After 5 days you can break quarantine if your  symptoms have improved and you have not had a fever for 24 hours without taking Tylenol or ibuprofen.  Use over-the-counter Tylenol and ibuprofen as needed for body aches and fever.  Use the ipratropium nasal spray, 2 squirts in each nostril 4 times a day, as needed for nasal congestion and runny nose.  Use the Tessalon Perles during the day as needed for cough and the Promethazine DM cough syrup at nighttime as will make you drowsy.  If you develop any increased shortness of breath-especially at rest, you are unable to speak in full sentences, or is a late sign your lips are turning blue you need to go the ER for evaluation.     ED Prescriptions    Medication Sig Dispense Auth. Provider   benzonatate (TESSALON) 100 MG capsule Take 2 capsules (200 mg total) by mouth every 8 (eight) hours. 21 capsule Margarette Canada, NP   ipratropium (ATROVENT) 0.06 % nasal spray Place 2 sprays into both nostrils 4 (four) times daily. 15 mL Margarette Canada, NP   promethazine-dextromethorphan (PROMETHAZINE-DM) 6.25-15 MG/5ML  syrup Take 5 mLs by mouth 4 (four) times daily as needed. 118 mL Margarette Canada, NP     PDMP not reviewed this encounter.   Margarette Canada, NP 07/27/20 1217

## 2020-07-27 NOTE — Discharge Instructions (Addendum)
Isolate at home pending the results of your COVID test.  If you test positive then you will have to quarantine for 5 days from the start of your symptoms.  After 5 days you can break quarantine if your symptoms have improved and you have not had a fever for 24 hours without taking Tylenol or ibuprofen.  Use over-the-counter Tylenol and ibuprofen as needed for body aches and fever.  Use the ipratropium nasal spray, 2 squirts in each nostril 4 times a day, as needed for nasal congestion and runny nose.  Use the Tessalon Perles during the day as needed for cough and the Promethazine DM cough syrup at nighttime as will make you drowsy.  If you develop any increased shortness of breath-especially at rest, you are unable to speak in full sentences, or is a late sign your lips are turning blue you need to go the ER for evaluation.

## 2020-07-28 LAB — SARS CORONAVIRUS 2 (TAT 6-24 HRS): SARS Coronavirus 2: NEGATIVE

## 2020-08-01 ENCOUNTER — Ambulatory Visit: Payer: 59 | Admitting: Gastroenterology

## 2020-08-14 ENCOUNTER — Other Ambulatory Visit: Payer: Self-pay | Admitting: Internal Medicine

## 2020-08-14 ENCOUNTER — Ambulatory Visit: Payer: 59 | Admitting: Nurse Practitioner

## 2020-08-14 ENCOUNTER — Ambulatory Visit: Payer: 59 | Admitting: Podiatry

## 2020-08-14 ENCOUNTER — Encounter: Payer: Self-pay | Admitting: Nurse Practitioner

## 2020-08-14 VITALS — BP 146/74 | HR 76 | Ht 65.5 in | Wt 195.0 lb

## 2020-08-14 DIAGNOSIS — Z86010 Personal history of colonic polyps: Secondary | ICD-10-CM

## 2020-08-14 DIAGNOSIS — K219 Gastro-esophageal reflux disease without esophagitis: Secondary | ICD-10-CM

## 2020-08-14 DIAGNOSIS — Z1231 Encounter for screening mammogram for malignant neoplasm of breast: Secondary | ICD-10-CM

## 2020-08-14 MED ORDER — SUTAB 1479-225-188 MG PO TABS: 1.0000 | ORAL_TABLET | ORAL | 0 refills | Status: DC

## 2020-08-14 NOTE — Progress Notes (Signed)
08/14/2020 Kelly Massey [id number] 02-Dec-1965   Chief Complaint: Acid reflux, schedule a colonoscopy   History of Present Illness: Kelly Massey is a 55 year old female with a past medical history of arthritis, asthma, hyperlipidemia, iron deficiency anemia, GERD and colon polyps.  She presents to our office today with complaints of worsening acid reflux symptoms.  She developed throat soreness mid March.  She presented to the urgent care clinic on 06/22/2020 with throat burning, odynophagia with chest/esophageal burning.  A rapid strep test was negative.  She was advised to take Claritin or Zyrtec for possible allergies with nasal congestion.  She was prescribed viscous lidocaine for her throat soreness.  She was advised to continue her Pepcid twice daily.  She also takes Mylanta as needed with relief.  She continued to have on and off throat soreness since then.  She described feeling a burning discomfort in her throat and upper esophagus which persisted for few days then would go away for a few days then recur.  She went back to the urgent care clinic 07/27/2020 with persistent throat burning as well as nasal congestion and a cough.  COVID testing was negative.  She was prescribed Atrovent nasal spray and Tessalon Perles.  She has a prior history of acid reflux for which she takes Famotidine 20 mg p.o. twice daily.  She stated she has taken numerous PPIs in the past without improvement.  She is concerned she is having persistent reflux symptoms.  No noticeable postnasal drainage or sinus congestion at this time.  She underwent an EGD in 2012 which identified GERD.  Her most recent EGD was 04/02/2019 which identified a normal esophagus, a 1 to 2 cm sliding hiatal hernia was noted.  The esophagus was empirically dilated.  She underwent esophageal manometry and PH impedence study 07/25/2019 which showed very few episodes of acid reflux therefore TIF was not recommended.  She is passing a normal  formed brown bowel movement most days.  No rectal bleeding or black stools.  She has a history of hyperplastic polyps.  Her most recent colonoscopy was 10/12/2010 and 4 hyperplastic polyps were removed from the rectosigmoid and rectum.  She is due for a colonoscopy 10/2020.  She wishes to schedule an EGD and colonoscopy July/2022.  No other complaints at this time.  CBC Latest Ref Rng & Units 06/20/2020 03/27/2020 02/26/2020  WBC 4.0 - 10.5 K/uL 2.9(L) 2.8(L) 2.8(L)  Hemoglobin 12.0 - 15.0 g/dL 14.3 14.3 14.2  Hematocrit 36.0 - 46.0 % 42.7 42.8 42.0  Platelets 150 - 400 K/uL 229 228.0 218.0   CMP Latest Ref Rng & Units 06/20/2020 02/26/2020 07/28/2016  Glucose 70 - 99 mg/dL 104(H) 95 90  BUN 6 - 20 mg/dL 7 9 7   Creatinine 0.44 - 1.00 mg/dL 0.71 0.84 0.72  Sodium 135 - 145 mmol/L 139 140 136  Potassium 3.5 - 5.1 mmol/L 3.7 4.8 4.6  Chloride 98 - 111 mmol/L 105 102 102  CO2 22 - 32 mmol/L 26 32 29  Calcium 8.9 - 10.3 mg/dL 9.2 9.4 9.5  Total Protein 6.5 - 8.1 g/dL 7.2 6.8 7.0  Total Bilirubin 0.3 - 1.2 mg/dL 0.8 0.8 0.4  Alkaline Phos 38 - 126 U/L 61 56 44  AST 15 - 41 U/L 18 16 14   ALT 0 - 44 U/L 17 12 10      Esophageal Manometry with PH impedence study 07/25/2019: 1.  Normal esophageal acid exposure with % time < 4 of 0.5% 2.  Esophageal acid exposure is normal in upright and supine position 3.  Overall reflux events within normal limits 4   Positive symptom correlation for reflux events associated with belching based on symptom association probability  EGD 04/02/2019: - Esophagogastric landmarks identified. - 1-2 cm sliding hiatal hernia. - Normal esophagus otherwise - empiric dilation performed to 58mm - Normal stomach. Hill grade III views of the cardia. - Normal duodenal bulb and second portion of the duodenum.  EGD 10/12/2019: -Normal stomach -Normal duodenum  Colonoscopy 10/12/2010: 4 polyps removed from the recto sigmoid and rectum   Part A: GEJ BIOPSY:  - SQUAMOCOLUMNAR  MUCOSA WITH REFLUX GASTROESOPHAGITIS.  - NEGATIVE FOR GOBLET CELLS, DYSPLASIA AND MALIGNANCY.  .  Part B: RECTO-SIGMOID JUNCTION POLYP X 2 COLD BIOPSY:  - HYPERPLASTIC POLYP (2).  - NEGATIVE FOR DYSPLASIA AND MALIGNANCY.  .  Part C: RECTAL VAULT POLYPS X 2 COLD BIOPSY:  - HYPERPLASTIC POLYP (4).  - NEGATIVE FOR DYSPLASIA AND MALIGNANCY  Past Medical History:  Diagnosis Date  . Allergy   . Anemia   . Arthritis    knees - no meds  . Asthma   . Colon polyps   . GERD (gastroesophageal reflux disease) 10/12/10   EGD, positive H. pylori  . HSV infection    History  . Hyperlipidemia    ? no meds - diet controlled  . Post-operative nausea and vomiting   . Seasonal allergies    Past Surgical History:  Procedure Laterality Date  . [place] STUDY N/A 07/25/2019   Procedure: [place] STUDY;  Surgeon: Lavena Bullion, DO;  Location: [facility];  Service: Gastroenterology;  Laterality: N/A;  . ABDOMINAL HYSTERECTOMY    . BALLOON DILATION N/A 07/24/2012   Procedure: BALLOON DILATION;  Surgeon: Inda Castle, MD;  Location: [facility];  Service: Endoscopy;  Laterality: N/A;  . BRAVO [place] STUDY N/A 07/24/2012   Procedure: BRAVO [place] STUDY;  Surgeon: Inda Castle, MD;  Location: [facility];  Service: Endoscopy;  Laterality: N/A;  . DILATION AND CURETTAGE OF UTERUS     SAB  . ESOPHAGEAL MANOMETRY N/A 07/25/2019   Procedure: ESOPHAGEAL MANOMETRY (EM);  Surgeon: Lavena Bullion, DO;  Location: [facility];  Service: Gastroenterology;  Laterality: N/A;  . ESOPHAGOGASTRODUODENOSCOPY N/A 07/24/2012   Procedure: ESOPHAGOGASTRODUODENOSCOPY (EGD);  Surgeon: Inda Castle, MD;  Location: [facility];  Service: Endoscopy;  Laterality: N/A;  . OVARIAN CYST REMOVAL  2004   laparotomy -left  . ROBOTIC ASSISTED LAPAROSCOPIC LYSIS OF ADHESION N/A 03/21/2014   Procedure: ROBOTIC ASSISTED LAPAROSCOPIC EXTENSIVE LYSIS OF ADHESION (1 Hour);  Surgeon: Marvene Staff, MD;  Location: [facility];   Service: Gynecology;  Laterality: N/A;  . ROBOTIC ASSISTED SALPINGO OOPHERECTOMY Left 03/21/2014   Procedure:  ROBOTIC ASSISTED LEFT OOPHORECTOMY;  Surgeon: Marvene Staff, MD;  Location: [facility];  Service: Gynecology;  Laterality: Left;  . rotator cuff surgery  2017  . SHOULDER ARTHROSCOPY Left   . TENNIS ELBOW RELEASE/NIRSCHEL PROCEDURE Right 05/01/2015   Procedure: RIGHT ELBOW DEBRIDEMENT AND TENDON REPAIR;  Surgeon: [name], MD;  Location: [facility];  Service: Orthopedics;  Laterality: Right;  . WISDOM TOOTH EXTRACTION     Current Outpatient Medications on File Prior to Visit  Medication Sig Dispense Refill  . acyclovir (ZOVIRAX) 400 MG tablet TAKE 1 TABLET (400 MG TOTAL) BY MOUTH 2 (TWO) TIMES DAILY AS NEEDED. 60 tablet 1  . benzonatate (TESSALON) 100 MG capsule Take 2  capsules (200 mg total) by mouth every 8 (eight) hours. 21 capsule 0  . Calcium Carbonate-Vitamin D 600-400 MG-UNIT tablet Take 1 tablet by mouth daily.    . diphenhydrAMINE (BENADRYL) 12.5 MG/5ML elixir Take by mouth as needed.    . famotidine (PEPCID) 20 MG tablet TAKE 1 TABLET BY MOUTH TWICE A DAY 60 tablet 2  . ferrous sulfate 325 (65 FE) MG tablet Take 325 mg by mouth daily.     . Fluocinolone Acetonide Body 0.01 % OIL Apply to scalp before shampooing. Use for 6-10 hours with the cap each week to every other week.    Marland Kitchen ipratropium (ATROVENT) 0.06 % nasal spray Place 2 sprays into both nostrils 4 (four) times daily. 15 mL 12  . mometasone (NASONEX) 50 MCG/ACT nasal spray Place 2 sprays into the nose daily. 51 g 1  . promethazine-dextromethorphan (PROMETHAZINE-DM) 6.25-15 MG/5ML syrup Take 5 mLs by mouth 4 (four) times daily as needed. (Patient taking differently: Take 5 mLs by mouth 4 (four) times daily as needed.) 118 mL 0  . vitamin E 400 UNIT capsule Take 400 Units by mouth daily. Reported on 10/09/2015     Current Facility-Administered Medications on File Prior to Visit  Medication Dose  Route Frequency Provider Last Rate Last Admin  . 0.9 %  sodium chloride infusion  500 mL Intravenous Once Armbruster, Carlota Raspberry, MD       Allergies  Allergen Reactions  . Adhesive [Tape] Other (See Comments)    Skin appeared "burned" after last surg.  . Aspirin Nausea And Vomiting    Other reaction(s): GI Upset (intolerance), Other (See Comments) Causes stomach cramps per patient Severe intolerance to Aspirin products  . Ibuprofen Nausea And Vomiting    Other reaction(s): Other (See Comments) Severe intolerance  . Penicillins Diarrhea  . Nsaids Other (See Comments)    GI pain  . Oxycodone Other (See Comments)    Severe abdominal cramps  . Shrimp [Shellfish Allergy] Nausea And Vomiting    Current Medications, Allergies, Past Medical History, Past Surgical History, Family History and Social History were reviewed in [employer] record.   Review of Systems:   Constitutional: Negative for fever, sweats, chills or weight loss.  Respiratory: Negative for shortness of breath.   Cardiovascular: Negative for chest pain, palpitations and leg swelling.  Gastrointestinal: See HPI.  Musculoskeletal: Negative for back pain or muscle aches.  Neurological: Negative for dizziness, headaches or paresthesias.    Physical Exam: BP (!) 146/74   Pulse 76   Ht 5' 5.5" (1.664 m)   Wt 195 lb (88.5 kg)   LMP 10/06/2011   BMI 31.96 kg/m  General: Well developed 55 year old female in no acute distress. Head: Normocephalic and atraumatic. Eyes: No scleral icterus. Conjunctiva pink . Ears: Normal auditory acuity. Mouth: Dentition intact. No ulcers or lesions.  Lungs: Clear throughout to auscultation. Heart: Regular rate and rhythm, no murmur. Abdomen: Soft, nontender and nondistended. No masses or hepatomegaly. Normal bowel sounds x 4 quadrants.  Rectal: Deferred.  Musculoskeletal: Symmetrical with no gross deformities. Extremities: No edema. Neurological: Alert oriented x  4. No focal deficits.  Psychological: Alert and cooperative. Normal mood and affect  Assessment and Recommendations:  83.  55 year old female with a history of GERD confirmed by EGD in 2012 the normal EGD without evidence of reflux in 2020.  Esophageal manometry with pH impedance without evidence of significant reflux 07/2019 therefore TIF (transoral incision less fundoplication) was not recommended.  She  has a persistent cough with throat irritation and she is concerned she has laryngeal reflux.  She wishes to schedule an EGD at the time of her colonoscopy 10/2020. -Continue Famotidine 20 mg p.o. twice daily, may increase to 40 mg twice daily if needed.  May continue Mylanta as needed. -Patient will call our office if her symptoms worsen -EGD benefits and risks discussed including risk with sedation, risk of bleeding, perforation and infection  -Dr. Havery Moros to review and to further verify if EGD warranted  -Consider follow up with  ENT Dr. Shari Prows  2.  History of hyperplastic rectosigmoid and rectal polyps per colonoscopy 10/2010 -Colonoscopy benefits and risks discussed including risk with sedation, risk of bleeding, perforation and infection   3. Past IDA. Hg 14.3 and HCT 42.7 on 06/20/2020  4. Chronic leukopenia, etiology unclear.  Previously followed by hematology. WBC 2.8 - 2.9

## 2020-08-14 NOTE — Patient Instructions (Addendum)
If you are age 55 or younger, your body mass index should be between 19-25. Your Body mass index is 31.96 kg/m. If this is out of the aformentioned range listed, please consider follow up with your Primary Care Provider.   PROCEDURES: You have been scheduled for a colonoscopy. Please follow the written instructions given to you at your visit today. Please pick up your prep supplies at the pharmacy within the next 1-3 days. If you use inhalers (even only as needed), please bring them with you on the day of your procedure.  RECOMMENDATIONS: Continue Famotidine 20 MG twice a day, may increase to 40 MG twice a day if needed. Please call our office if your symptoms worsen.  It was great seeing you today! Thank you for entrusting me with your care and choosing [facility].  Noralyn Pick, CRNP   Gastroesophageal Reflux Disease, Adult  Gastroesophageal reflux (GER) happens when acid from the stomach flows up into the tube that connects the mouth and the stomach (esophagus). Normally, food travels down the esophagus and stays in the stomach to be digested. With GER, food and stomach acid sometimes move back up into the esophagus. You may have a disease called gastroesophageal reflux disease (GERD) if the reflux:  Happens often.  Causes frequent or very bad symptoms.  Causes problems such as damage to the esophagus. When this happens, the esophagus becomes sore and swollen. Over time, GERD can make small holes (ulcers) in the lining of the esophagus. What are the causes? This condition is caused by a problem with the muscle between the esophagus and the stomach. When this muscle is weak or not normal, it does not close properly to keep food and acid from coming back up from the stomach. The muscle can be weak because of:  Tobacco use.  Pregnancy.  Having a certain type of hernia (hiatal hernia).  Alcohol use.  Certain foods and drinks, such as coffee, chocolate,  onions, and peppermint. What increases the risk?  Being overweight.  Having a disease that affects your connective tissue.  Taking NSAIDs, such a ibuprofen. What are the signs or symptoms?  Heartburn.  Difficult or painful swallowing.  The feeling of having a lump in the throat.  A bitter taste in the mouth.  Bad breath.  Having a lot of saliva.  Having an upset or bloated stomach.  Burping.  Chest pain. Different conditions can cause chest pain. Make sure you see your doctor if you have chest pain.  Shortness of breath or wheezing.  A long-term cough or a cough at night.  Wearing away of the surface of teeth (tooth enamel).  Weight loss. How is this treated?  Making changes to your diet.  Taking medicine.  Having surgery. Treatment will depend on how bad your symptoms are. Follow these instructions at home: Eating and drinking  Follow a diet as told by your doctor. You may need to avoid foods and drinks such as: ? Coffee and tea, with or without caffeine. ? Drinks that contain alcohol. ? Energy drinks and sports drinks. ? Bubbly (carbonated) drinks or sodas. ? Chocolate and cocoa. ? Peppermint and mint flavorings. ? Garlic and onions. ? Horseradish. ? Spicy and acidic foods. These include peppers, chili powder, curry powder, vinegar, hot sauces, and BBQ sauce. ? Citrus fruit juices and citrus fruits, such as oranges, lemons, and limes. ? Tomato-based foods. These include red sauce, chili, salsa, and pizza with red sauce. ? Fried and fatty foods.  These include donuts, french fries, potato chips, and high-fat dressings. ? High-fat meats. These include hot dogs, rib eye steak, sausage, ham, and bacon. ? High-fat dairy items, such as whole milk, butter, and cream cheese.  Eat small meals often. Avoid eating large meals.  Avoid drinking large amounts of liquid with your meals.  Avoid eating meals during the 2-3 hours before bedtime.  Avoid lying down  right after you eat.  Do not exercise right after you eat.   Lifestyle  Do not smoke or use any products that contain nicotine or tobacco. If you need help quitting, ask your doctor.  Try to lower your stress. If you need help doing this, ask your doctor.  If you are overweight, lose an amount of weight that is healthy for you. Ask your doctor about a safe weight loss goal.   General instructions  Pay attention to any changes in your symptoms.  Take over-the-counter and prescription medicines only as told by your doctor.  Do not take aspirin, ibuprofen, or other NSAIDs unless your doctor says it is okay.  Wear loose clothes. Do not wear anything tight around your waist.  Raise (elevate) the head of your bed about 6 inches (15 cm). You may need to use a wedge to do this.  Avoid bending over if this makes your symptoms worse.  Keep all follow-up visits. Contact a doctor if:  You have new symptoms.  You lose weight and you do not know why.  You have trouble swallowing or it hurts to swallow.  You have wheezing or a cough that keeps happening.  You have a hoarse voice.  Your symptoms do not get better with treatment. Get help right away if:  You have sudden pain in your arms, neck, jaw, teeth, or back.  You suddenly feel sweaty, dizzy, or light-headed.  You have chest pain or shortness of breath.  You vomit and the vomit is green, yellow, or black, or it looks like blood or coffee grounds.  You faint.  Your poop (stool) is red, bloody, or black.  You cannot swallow, drink, or eat. These symptoms may represent a serious problem that is an emergency. Do not wait to see if the symptoms will go away. Get medical help right away. Call your local emergency services (911 in the [place] Do not drive yourself to the hospital. Summary  If a person has gastroesophageal reflux disease (GERD), food and stomach acid move back up into the esophagus and cause symptoms or problems  such as damage to the esophagus.  Treatment will depend on how bad your symptoms are.  Follow a diet as told by your doctor.  Take all medicines only as told by your doctor. This information is not intended to replace advice given to you by your health care provider. Make sure you discuss any questions you have with your health care provider. Document Revised: 10/01/2019 Document Reviewed: 10/01/2019 Elsevier Patient Education  [place].

## 2020-08-16 NOTE — Progress Notes (Signed)
File reviewed. She was evaluated for possible TIF but not deemed to be a candidate when her 24 HR pH study did not show any significant reflux events. I think the yield of a repeat EGD in this situation is going to be quite low, she has no Barrett's esophagus. With her pH study, I don't think EGD will likely change management. I would agree a follow up with ENT is reasonable at this time.

## 2020-08-17 NOTE — Progress Notes (Signed)
Beth, pls contact patient and let her know Dr. Havery Moros does not assess a repeat EGD necessary at this point. Pls cancel the EGD that was scheduled. Pls advise patient to follow up with her ENT for her ongoing symptoms. THX

## 2020-08-18 ENCOUNTER — Telehealth: Payer: Self-pay

## 2020-08-18 NOTE — Telephone Encounter (Addendum)
-----   Message from Noralyn Pick, NP sent at 08/17/2020  6:46 AM EDT ----- Kelly Massey, pls contact patient and let her know Dr. Havery Moros does not assess a repeat EGD necessary at this point. Pls cancel the EGD that was scheduled. Pls advise patient to follow up with her ENT for her ongoing symptoms. THX

## 2020-08-18 NOTE — Telephone Encounter (Signed)
Spoke with the patient. She is in agreement with cancelling the EGD.   She also states her prep (Sutabs) is too expensive, costing $60 dollars. She requests a different prep. Thanks

## 2020-08-19 NOTE — Telephone Encounter (Signed)
Since patient can not afford the Sutab we will have her do the OTC miralax.  [facility] message sent.

## 2020-08-25 ENCOUNTER — Other Ambulatory Visit: Payer: Self-pay

## 2020-08-25 ENCOUNTER — Ambulatory Visit: Payer: 59 | Admitting: Podiatry

## 2020-08-25 DIAGNOSIS — M722 Plantar fascial fibromatosis: Secondary | ICD-10-CM

## 2020-08-25 NOTE — Patient Instructions (Signed)

## 2020-08-26 ENCOUNTER — Other Ambulatory Visit (INDEPENDENT_AMBULATORY_CARE_PROVIDER_SITE_OTHER): Payer: 59

## 2020-08-26 ENCOUNTER — Encounter: Payer: Self-pay | Admitting: Podiatry

## 2020-08-26 ENCOUNTER — Encounter: Payer: Self-pay | Admitting: Internal Medicine

## 2020-08-26 DIAGNOSIS — R739 Hyperglycemia, unspecified: Secondary | ICD-10-CM

## 2020-08-26 DIAGNOSIS — E7800 Pure hypercholesterolemia, unspecified: Secondary | ICD-10-CM

## 2020-08-26 LAB — BASIC METABOLIC PANEL
BUN: 7 mg/dL (ref 6–23)
CO2: 29 mEq/L (ref 19–32)
Calcium: 9.3 mg/dL (ref 8.4–10.5)
Chloride: 103 mEq/L (ref 96–112)
Creatinine, Ser: 0.85 mg/dL (ref 0.40–1.20)
GFR: 77.26 mL/min (ref >60.00)
Glucose, Bld: 99 mg/dL (ref 70–99)
Potassium: 4.6 mEq/L (ref 3.5–5.1)
Sodium: 140 mEq/L (ref 135–145)

## 2020-08-26 LAB — LIPID PANEL
Cholesterol: 215 mg/dL — ABNORMAL HIGH (ref 0–200)
HDL: 81 mg/dL (ref >39.00)
LDL Cholesterol: 116 mg/dL — ABNORMAL HIGH (ref 0–99)
NonHDL: 133.69
Total CHOL/HDL Ratio: 3
Triglycerides: 88 mg/dL (ref 0.0–149.0)
VLDL: 17.6 mg/dL (ref 0.0–40.0)

## 2020-08-26 LAB — CBC WITH DIFFERENTIAL/PLATELET
Basophils Absolute: 0 10*3/uL (ref 0.0–0.1)
Basophils Relative: 0.8 % (ref 0.0–3.0)
Eosinophils Absolute: 0 10*3/uL (ref 0.0–0.7)
Eosinophils Relative: 1.4 % (ref 0.0–5.0)
HCT: 42.1 % (ref 36.0–46.0)
Hemoglobin: 14 g/dL (ref 12.0–15.0)
Lymphocytes Relative: 52.5 % — ABNORMAL HIGH (ref 12.0–46.0)
Lymphs Abs: 1.4 10*3/uL (ref 0.7–4.0)
MCHC: 33.4 g/dL (ref 30.0–36.0)
MCV: 91.2 fl (ref 78.0–100.0)
Monocytes Absolute: 0.3 10*3/uL (ref 0.1–1.0)
Monocytes Relative: 11.4 % (ref 3.0–12.0)
Neutro Abs: 0.9 10*3/uL — ABNORMAL LOW (ref 1.4–7.7)
Neutrophils Relative %: 33.9 % — ABNORMAL LOW (ref 43.0–77.0)
Platelets: 218 10*3/uL (ref 150.0–400.0)
RBC: 4.61 Mil/uL (ref 3.87–5.11)
RDW: 14.5 % (ref 11.5–15.5)
WBC: 2.6 10*3/uL — ABNORMAL LOW (ref 4.0–10.5)

## 2020-08-26 LAB — HEPATIC FUNCTION PANEL
ALT: 15 U/L (ref 0–35)
AST: 17 U/L (ref 0–37)
Albumin: 4.2 g/dL (ref 3.5–5.2)
Alkaline Phosphatase: 58 U/L (ref 39–117)
Bilirubin, Direct: 0.1 mg/dL (ref 0.0–0.3)
Total Bilirubin: 0.7 mg/dL (ref 0.2–1.2)
Total Protein: 7.1 g/dL (ref 6.0–8.3)

## 2020-08-26 LAB — HEMOGLOBIN A1C: Hgb A1c MFr Bld: 6.2 % (ref 4.6–6.5)

## 2020-08-26 NOTE — Progress Notes (Signed)
  Subjective:  Patient ID: Kelly Massey, female    DOB: Oct 31, 1965,  MRN: [id number]  Chief Complaint  Patient presents with  . Foot Pain     63m fu painful sharp pain-left foot/bottom-side    55 y.o. female returns with the above complaint. History confirmed with patient.  Injection was helpful, to improve her pain somewhat she has been wearing a copper fit compression sleeve which is more comfortable than the plantar fascial brace  Objective:  Physical Exam: warm, good capillary refill, no trophic changes or ulcerative lesions, normal DP and PT pulses and normal sensory exam. Left Foot: point tenderness over the heel pad  Right Foot: point tenderness over the heel pad, less intense than left    Radiographs: X-ray of both feet: no fracture, dislocation, swelling or degenerative changes noted, she has a plantar cutaneous spur on the left side a Assessment:   1. Plantar fasciitis      Plan:  Patient was evaluated and treated and all questions answered.  Discussed the etiology and treatment options for plantar fasciitis including stretching, formal physical therapy, supportive shoegears such as a running shoe or sneaker, pre fabricated orthoses, injection therapy, and oral medications. We also discussed the role of surgical treatment of this for patients who do not improve after exhausting non-surgical treatment options.   Plantar Fasciitis  -Recommend she continue stretching and icing of the affected limb -Continue using night splint  -Physical therapy referral sent in [place]  After sterile prep with povidone-iodine solution and alcohol, the left heel was injected with  1cc 0.5% marcaine plain, 10mg  triamcinolone acetonide, and 4mg  dexamethasone was injected along  the plantar fascia at the insertion on the plantar calcaneus. The patient tolerated the procedure well without complication.  Return in about 6 weeks (around 10/06/2020) for recheck plantar fasciitis.

## 2020-08-30 ENCOUNTER — Other Ambulatory Visit: Payer: Self-pay | Admitting: Internal Medicine

## 2020-09-01 ENCOUNTER — Other Ambulatory Visit: Payer: Self-pay | Admitting: Nurse Practitioner

## 2020-09-10 ENCOUNTER — Ambulatory Visit: Payer: 59 | Attending: Podiatry | Admitting: Physical Therapy

## 2020-09-10 ENCOUNTER — Other Ambulatory Visit: Payer: Self-pay

## 2020-09-10 DIAGNOSIS — M79671 Pain in right foot: Secondary | ICD-10-CM | POA: Insufficient documentation

## 2020-09-10 DIAGNOSIS — M79672 Pain in left foot: Secondary | ICD-10-CM | POA: Diagnosis present

## 2020-09-10 DIAGNOSIS — R262 Difficulty in walking, not elsewhere classified: Secondary | ICD-10-CM | POA: Diagnosis present

## 2020-09-10 NOTE — Therapy (Signed)
[facility] [address] [place], [place], [address] Phone: [phone]   Fax:  [phone]  Physical Therapy Evaluation  Patient Details  Name: Kelly Massey MRN: [id number] Date of Birth: 01-09-1966 Referring Provider (PT): Criselda Peaches, DPM  Encounter Date: 09/10/2020   PT End of Session - 09/11/20 2303     Visit Number 1    Number of Visits 16    Date for PT Re-Evaluation 12/31/20    Authorization - Visit Number 1    Authorization - Number of Visits 10    Progress Note Due on Visit 10    PT Start Time 1720    PT Stop Time 2376    PT Time Calculation (min) 45 min    Activity Tolerance Patient tolerated treatment well;No increased pain    Behavior During Therapy [facility] for tasks assessed/performed              [facility] PT Assessment - 09/11/20 2359       Assessment   Medical Diagnosis bilateral plantar fasciitis    Referring Provider (PT) Criselda Peaches, DPM    Onset Date/Surgical Date 08/14/20    Next MD Visit in 6 weeks    Prior Therapy None for this condition      Balance Screen   Has the patient fallen in the past 6 months No      Prior Function   Level of Independence Independent              Past Medical History:  Diagnosis Date   Allergy    Anemia    Arthritis    knees - no meds   Asthma    Colon polyps    GERD (gastroesophageal reflux disease) 10/12/10   EGD, positive H. pylori   HSV infection    History   Hyperlipidemia    ? no meds - diet controlled   Post-operative nausea and vomiting    Seasonal allergies     Past Surgical History:  Procedure Laterality Date   74 HOUR [place] STUDY N/A 07/25/2019   Procedure: 24 HOUR [place] STUDY;  Surgeon: Lavena Bullion, DO;  Location: [facility];  Service: Gastroenterology;  Laterality: N/A;   ABDOMINAL HYSTERECTOMY     BALLOON DILATION N/A 07/24/2012   Procedure: BALLOON DILATION;  Surgeon: Inda Castle, MD;  Location: [facility];  Service:  Endoscopy;  Laterality: N/A;   BRAVO [place] STUDY N/A 07/24/2012   Procedure: BRAVO [place] STUDY;  Surgeon: Inda Castle, MD;  Location: [facility];  Service: Endoscopy;  Laterality: N/A;   DILATION AND CURETTAGE OF UTERUS     SAB   ESOPHAGEAL MANOMETRY N/A 07/25/2019   Procedure: ESOPHAGEAL MANOMETRY (EM);  Surgeon: Lavena Bullion, DO;  Location: [facility];  Service: Gastroenterology;  Laterality: N/A;   ESOPHAGOGASTRODUODENOSCOPY N/A 07/24/2012   Procedure: ESOPHAGOGASTRODUODENOSCOPY (EGD);  Surgeon: Inda Castle, MD;  Location: [facility];  Service: Endoscopy;  Laterality: N/A;   OVARIAN CYST REMOVAL  2004   laparotomy -left   ROBOTIC ASSISTED LAPAROSCOPIC LYSIS OF ADHESION N/A 03/21/2014   Procedure: ROBOTIC ASSISTED LAPAROSCOPIC EXTENSIVE LYSIS OF ADHESION (1 Hour);  Surgeon: Marvene Staff, MD;  Location: [facility];  Service: Gynecology;  Laterality: N/A;   ROBOTIC ASSISTED SALPINGO OOPHERECTOMY Left 03/21/2014   Procedure:  ROBOTIC ASSISTED LEFT OOPHORECTOMY;  Surgeon: Marvene Staff, MD;  Location: [facility];  Service: Gynecology;  Laterality: Left;   rotator cuff surgery  2017   SHOULDER ARTHROSCOPY Left    TENNIS ELBOW RELEASE/NIRSCHEL PROCEDURE Right 05/01/2015   Procedure: RIGHT ELBOW DEBRIDEMENT AND TENDON REPAIR;  Surgeon: [name], MD;  Location: [facility];  Service: Orthopedics;  Laterality: Right;   WISDOM TOOTH EXTRACTION      There were no vitals filed for this visit.   PT Education - 09/12/20 0005     Education provided Yes    Education Details Patient education on current condition, anatomy involved, prognosis, plan of care.    Person(s) Educated Patient    Methods Explanation    Comprehension Verbalized understanding                Subjective Assessment - 09/11/20 2317     Subjective Bilat plantar foot pain, plantar fasciitis    Pertinent History Patient is a 55 year old female with primary complaint of plantar foot  pain. Pt is s/p cortisone injection and prednisone taper. She states that symptoms returned after about 3 days. Patient reports pain in L foot more than R foot. Patient reportsnotable pain with prolonged walking. Pt reports insidious onset without trauma. Patient reports that sypmtoms began as minor pinch in foot and got worrse. She reports pain beginning around [facility] 2022. Pt describes pain as shooting, sharp; she reports sometimes having aching pain in the evening. Patient reports that bracing does help, but "not a lot." Pain is near calcaneal tubercle region in plantar foot, L more affected than R. Patient denies numbness or paresthesia. Worse in the AM. 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 Patient is still working full-time. No night pain.    Limitations Lifting;House hold activities    Patient Stated Goals Patient wants to be pain-free, able to walk outside/regular walking program, gardening/flower bed    Currently in Pain? Yes    Pain Score 8     Pain Onset More than a month ago               SUBJECTIVE Pain: Present 8/10, Best 0/10, Worst 10/10 Pain description: aching, intermittent sharp, shooting Radiating pain: No Numbness/Tingling: No 24 hour pain behavior: worse in AM Aggravating factors: prolonged walking, fast gait Easing factors: unloading feet, braces, cortisone shot (short-term relief) History of back, hip, or knee pain: Yes, history of L-sided bursitis and L sciatica about 2 years ago.  Next MD Visit: in 6 weeks  Follow-up appointment with MD: Yes Imaging: Yes,  From referring providers note: X-ray of both feet: no fracture, dislocation, swelling or degenerative changes noted, she has a plantar cutaneous spur on the left side Typical footwear: uses [facility] [employer] for work [employer] (fever/chills, dysarthria, dysphagia, bowel and bladder changes, recent weight  loss/gain, personal history of cancer, night pain): No   OBJECTIVE  MUSCULOSKELETAL: Tremor: Absent Bulk: Normal Tone: Normal, no clonus No trophic changes noted to foot/ankle. No ecchymosis, erythema, or edema noted.  Observation: Mild pes planus, navicular drop below Feiss line in standing  Lumbar/Hip/Knee Screen AROM: WFL and painless with overpressure in all planes  Gait Mild overpronation. Dec stance time LLE and shortened step/stride length bilat. No excessive shoe wear noted on medial or lateral surfaces  Palpation TTP along L calcaneal tubercle; R flexor hallucis brevis, R quadratus plantae and R lateral plantar surface  Strength R/L 5/5 Hip flexion 5/5 Hip abduction 5/5 Hip adduction 5/5 Knee extension 5/5 Knee flexion 4+/4+ Ankle Plantarflexion 5/5  Ankle Dorsiflexion 4/4 Ankle Inversion 5/5 Ankle Eversion Full ROM for   AROM R/L 50/50 Ankle Plantarflexion -8/-2 Ankle Dorsiflexion 35/35 Ankle Inversion 20/14 Ankle Eversion 65/46 Great toe EXT  *Indicates Pain  PROM R/L 50/50* Ankle Plantarflexion 8/2 Ankle Dorsiflexion 35/35 Ankle Inversion 15/15 Ankle Eversion 75/65 Great toe extension *Indicates Pain  Passive Accessory Motion Talocrural Joint Distraction: Hypomobile Talocrural Joint AP: Hypomobile Talocrural Joint PA: WNL  NEUROLOGICAL:  Mental Status Patient is oriented to person, place and time.  Recent memory is intact.  Remote memory is intact.  Attention span and concentration are intact.  Expressive speech is intact.  Patient's fund of knowledge is within normal limits for educational level.  Sensation Grossly intact to light touch bilateral LEs as determined by testing dermatomes L2-S2 Proprioception and hot/cold testing deferred on this date  Reflexes Deferred  SPECIAL TESTS  Lumbar Spine/Hip Screening SLUMP (-) bilat PACE sign (-)  Other Windlass Mechanism Test: Negative   Objective measurements completed on  examination: See above findings.     Treatments Performed Therapeutic activities - patient education and activities to improve patient function, home program education  HEP review: Great toe self-stretch, seated; x30sec [verbal cueing for technique] Short foot; x10 [verbal cueing for technique] Standing gastrocnemius stretch; lunge at wall; x30sec, bilateral [verbal cueing for technique and setup]  Patient education on appropriate footwear and strategies to mitigate plantar foot pain. Discussed favorable natural history of her condition.    PT Education - 09/12/20 0005     Education provided Yes    Education Details Patient education on current condition, anatomy involved, prognosis, plan of care.    Person(s) Educated Patient    Methods Explanation    Comprehension Verbalized understanding               PT Short Term Goals - 09/11/20 2340       PT SHORT TERM GOAL #1   Title Pt will be independent with HEP in order to decrease foot pain and improve mobility deficits and strength in order to improve pain-free function at home and work.    Baseline HEP provided and reviewed    Time 3    Period Weeks    Status New    Target Date 10/01/20      PT SHORT TERM GOAL #2   Title Patient will improve ankle dorsiflexion PROM to 10-15 deg or greater as needed for functional ankle mobility during gait.    Baseline Ankle dorsiflexion PROM R 8 deg, L 2 deg    Time 3    Period Weeks               PT Long Term Goals - 09/11/20 2344       PT LONG TERM GOAL #1   Title Patient will tolerate half of her work shift prior to mid-day break without pain >2/10    Baseline Significant pain following weightbearing activity throughout work day    Time 6    Period Weeks    Status New    Target Date 10/22/20      PT LONG TERM GOAL #2   Title Patient will demonstrate improved function as evidenced by a score of 61 on FOTO measure for full participation in activities at home and in the  community.    Baseline FOTO score 6/8: 40    Time 6    Period Weeks    Status New    Target Date 10/22/20      PT  LONG TERM GOAL #3   Title Patient willl perform push-sled with work-equivalent weight mimicking pushing tubs at work with proper body mechanics and no pain > 1-2/10 indicative of improved ability to perform high-level physical work tasks required    Baseline Pain with pushing rolling tubs at work    Time 6    Period Weeks    Status New    Target Date 10/22/20      PT LONG TERM GOAL #4   Title Patient will have WFL ankle and digit AROM and PROM as needed for gait, obstacle/stair negotiation, transferring, and squatting    Baseline Limited ankle and great toe mobility at IE    Time 6    Period Weeks    Status New    Target Date 10/22/20                    Plan - 09/11/20 2326     Clinical Impression Statement Patient is a pleasant 55 year old female with primary complaint of bilateral plantar foot pain (left worse than right) referred for bilateral plantar fasciitis with current primary activity limitations in prolonged walking, prolonged standing, and pushing/pulling/lifting items as needed for work. The patient presents with current impairments in ankle ROM, hallux mobility, gastrocnemius flexibility, inversion strength, and plantar foot pain. The patient will benefit from skilled PT intervention to address deficits in mobility, pain, and function as needed for best return to her prior level of functioning.    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 Motivation. Active lifestyle. Negative: bilateral LE involvement, laborous nature of work    PT Frequency 2x / week    PT Duration 6 weeks    PT Treatment/Interventions Electrical Stimulation;Therapeutic activities;Therapeutic exercise;Neuromuscular re-education;Manual techniques;Patient/family education;Passive range of motion;Dry needling    PT Next Visit Plan Manual therapy for improved ankle and hallux mobility, plantar soft tissue mobility and desensitization; intrinsic mm re-training and progression to graded loading as able    PT Home Exercise Plan Access Code [email or web address]    Consulted and Agree with Plan of Care Patient             Patient will benefit from skilled therapeutic intervention in order to improve the following deficits and impairments:  Abnormal gait, Decreased range of motion, Decreased strength, Hypomobility, Pain, Impaired flexibility, Difficulty walking  Visit Diagnosis: Pain in right foot  Pain in left foot  Difficulty in walking, not elsewhere classified     Problem List Patient Active Problem List   Diagnosis Date Noted   Hyperglycemia 07/23/2020   Sciatica 02/23/2020   Tinnitus 12/02/2019   Belching    Globus sensation    Hot flashes 07/01/2019   Loss of taste 02/05/2019   Headache 08/28/2018   Exposure to COVID-19 virus 08/24/2018   Chest pain 10/30/2017   Witnessed apneic spells 10/31/2016   Other neutropenia ([place]) 08/10/2016   Vertigo 07/28/2016   Health care maintenance 11/21/2014   History of colonic polyps 11/21/2014   Dysphagia 11/19/2014   Trochanteric bursitis of left hip 10/08/2014   URI (upper respiratory infection) 02/10/2014   Pelvic pain in female 12/25/2013   Personal history  of ovarian cyst 12/25/2013   Change in bowel movement 11/11/2013   breast tenderness 09/29/2013   Dysphagia, unspecified(787.20) 06/12/2012   Barrett's esophagus 06/12/2012   Environmental allergies  02/13/2012   Enlarged thyroid 02/13/2012   GERD (gastroesophageal reflux disease) 02/08/2012   Leukopenia 02/08/2012   Fatigue 02/08/2012   Hypercholesterolemia 02/08/2012   Valentina Gu, PT, DPT [phone] Eilleen Kempf 09/12/2020, 12:06 AM  [facility] [address]. [place], [place], [address] Phone: [phone]   Fax:  [phone]  Name: Kelly Massey MRN: [id number] Date of Birth: 1965/08/27

## 2020-09-15 ENCOUNTER — Encounter: Payer: 59 | Admitting: Physical Therapy

## 2020-09-17 ENCOUNTER — Encounter: Payer: 59 | Admitting: Physical Therapy

## 2020-09-22 ENCOUNTER — Encounter: Payer: Self-pay | Admitting: Physical Therapy

## 2020-09-22 ENCOUNTER — Ambulatory Visit: Payer: 59 | Admitting: Physical Therapy

## 2020-09-22 ENCOUNTER — Other Ambulatory Visit: Payer: Self-pay

## 2020-09-22 DIAGNOSIS — M79672 Pain in left foot: Secondary | ICD-10-CM

## 2020-09-22 DIAGNOSIS — M79671 Pain in right foot: Secondary | ICD-10-CM

## 2020-09-22 DIAGNOSIS — R262 Difficulty in walking, not elsewhere classified: Secondary | ICD-10-CM
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
## 2020-09-24 ENCOUNTER — Encounter: Payer: 59 | Admitting: Physical Therapy

## 2020-09-29 ENCOUNTER — Other Ambulatory Visit: Payer: Self-pay

## 2020-09-29 ENCOUNTER — Encounter: Payer: Self-pay | Admitting: Physical Therapy

## 2020-09-29 ENCOUNTER — Ambulatory Visit: Payer: 59 | Admitting: Physical Therapy

## 2020-09-29 ENCOUNTER — Other Ambulatory Visit: Payer: Self-pay | Admitting: Internal Medicine

## 2020-09-29 DIAGNOSIS — R262 Difficulty in walking, not elsewhere classified: Secondary | ICD-10-CM

## 2020-09-29 DIAGNOSIS — M79671 Pain in right foot: Secondary | ICD-10-CM | POA: Diagnosis not present

## 2020-09-29 DIAGNOSIS — M79672 Pain in left foot: Secondary | ICD-10-CM

## 2020-09-29 NOTE — Therapy (Signed)
[facility] [address] [place], [place], [address] Phone: [phone]   Fax:  [phone]  Physical Therapy Treatment  Patient Details  Name: Kelly Massey MRN: [id number] Date of Birth: [birth year] Referring Provider (PT): Criselda Peaches, DPM   Encounter Date: 09/29/2020   PT End of Session - 09/29/20 1708     Visit Number 3    Number of Visits 16    Date for PT Re-Evaluation 12/31/20    Authorization - Visit Number 3    Authorization - Number of Visits 10    Progress Note Due on Visit 10    PT Start Time 8416    PT Stop Time 1735    PT Time Calculation (min) 45 min    Activity Tolerance Patient tolerated treatment well;No increased pain    Behavior During Therapy WFL for tasks assessed/performed             Past Medical History:  Diagnosis Date   Allergy    Anemia    Arthritis    knees - no meds   Asthma    Colon polyps    GERD (gastroesophageal reflux disease) 10/12/10   EGD, positive H. pylori   HSV infection    History   Hyperlipidemia    ? no meds - diet controlled   Post-operative nausea and vomiting    Seasonal allergies     Past Surgical History:  Procedure Laterality Date   24 HOUR [place] STUDY N/A 07/25/2019   Procedure: 24 HOUR [place] STUDY;  Surgeon: Lavena Bullion, DO;  [facility]: [facility];  Service: Gastroenterology;  Laterality: N/A;   ABDOMINAL HYSTERECTOMY     BALLOON DILATION N/A 07/24/2012   Procedure: BALLOON DILATION;  Surgeon: Inda Castle, MD;  [facility];  Service: Endoscopy;  Laterality: N/A;   BRAVO [place] STUDY N/A 07/24/2012   Procedure: BRAVO [place] STUDY;  Surgeon: Inda Castle, MD;  [facility];  Service: Endoscopy;  Laterality: N/A;   DILATION AND CURETTAGE OF UTERUS     SAB   ESOPHAGEAL MANOMETRY N/A 07/25/2019   Procedure: ESOPHAGEAL MANOMETRY (EM);  Surgeon: Lavena Bullion, DO;  [facility];  Service: Gastroenterology;  Laterality: N/A;    ESOPHAGOGASTRODUODENOSCOPY N/A 07/24/2012   Procedure: ESOPHAGOGASTRODUODENOSCOPY (EGD);  Surgeon: Inda Castle, MD;  Location: [facility];  Service: Endoscopy;  Laterality: N/A;   OVARIAN CYST REMOVAL  2004   laparotomy -left   ROBOTIC ASSISTED LAPAROSCOPIC LYSIS OF ADHESION N/A 03/21/2014   Procedure: ROBOTIC ASSISTED LAPAROSCOPIC EXTENSIVE LYSIS OF ADHESION (1 Hour);  Surgeon: Marvene Staff, MD;  Location: [facility];  Service: Gynecology;  Laterality: N/A;   ROBOTIC ASSISTED SALPINGO OOPHERECTOMY Left 03/21/2014   Procedure:  ROBOTIC ASSISTED LEFT OOPHORECTOMY;  Surgeon: Marvene Staff, MD;  Location: [facility];  Service: Gynecology;  Laterality: Left;   rotator cuff surgery  2017   SHOULDER ARTHROSCOPY Left    TENNIS ELBOW RELEASE/NIRSCHEL PROCEDURE Right 05/01/2015   Procedure: RIGHT ELBOW DEBRIDEMENT AND TENDON REPAIR;  Surgeon: [name], MD;  Location: [facility];  Service: Orthopedics;  Laterality: Right;   WISDOM TOOTH EXTRACTION      There were no vitals filed for this visit.   Subjective Assessment - 09/29/20 1637     Subjective Patient reports no foot pain this afternoon. She reports she is still using straps for longitudinal arch support. She reports more pain in hips versus feet today. Patient  reports compliance with HEP. Patient reports work has been going well. She reports she has been cautious with heavy pushing and lifting at work.    Pertinent History Patient is a 55 year old female with primary complaint of plantar foot pain. Pt is s/p cortisone injection and prednisone taper. She states that symptoms returned after about 3 days. Patient reports pain in L foot more than R foot. Patient reportsnotable pain with prolonged walking. Pt reports insidious onset without trauma. Patient reports that sypmtoms began as minor pinch in foot and got worrse. She reports pain beginning around [facility] 2022. Pt describes pain as shooting, sharp; she reports  sometimes having aching pain in the evening. Patient reports that bracing does help, but "not a lot." Pain is near calcaneal tubercle region in plantar foot, L more affected than R. Patient denies numbness or paresthesia. Worse in the AM. 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 Patient is still working full-time. No night pain.    Limitations Lifting;House hold activities    Patient Stated Goals Patient wants to be pain-free, able to walk outside/regular walking program, gardening/flower bed    Pain Onset More than a month ago                 Treatments Performed   Manual Therapy - for soft tissue mobility, talocrural and 1st MTP joint mobility, symptom modulation (Performed bilaterally) DTM and IASTM plantar arch and medial>lateral gastrocnemius. 1st MTP dorsal mobilization gr III for improved extension mobility Posterior talocrural mobilization gr III-IV for improved ankle mobility   Manual dorsiflexion PROM with gentle overpressure at end-range   Therapeutic Exercise - for great toe and ankle mobility, plantarflexor soft tissue mobility, intrinsic mm strengthening   Great toe stretch at wall, lunge; 3x30sec bilateral [verbal cueing for technique] Towel scrunch with 1-lb Dumbbell; 2x1 min bilateral Tandem stance; 2x30 sec, bilat (step handrails for UE support as needed)  Patient education: HEP review and update to include intrinsic strengthening with mild resistance and hallux extension stretching at wall    [*not today*] Short foot; 2x10, bilaterally [verbal/tactile cueing and demo for technique]     Patient response to intervention Patient denies remarkable foot pain at end of session. Patient exhibits normalizing hallux extension mobility and improving bilateral ankle dorsiflexion (though patient still has significant dorsiflexion deficit bilaterally)     ASSESSMENT Patient  arrives with minimal complaints of foot pain. Her current orthotic footwear is working well for weightbearing activity during patient's workday and during ADLs. She reports comorbid hip pain with history of bursitis without paresthesias or pain inferior to gluteal fold. Patient may need to follow-up with provider if this is not improving over the next 1-2 weeks. Patient tolerates low-level strengthening for intrinsic foot musculature and ankle stability drills well. Patient presents with remaining deficits in hallux and ankle mobility, plantarflexor soft tissue mobility, motor control of arch, ankle ROM, and pain. Patient will benefit from continued skilled therapeutic intervention to address the above deficits and activity limitations in prolonged walking, prolonged standing, and pushing/lifting activities for best return to PLOF and improved ability to perform work-related duties.         PT Education - 09/30/20 1001     Education provided Yes    Education Details Discussed with patient expected self-limiting nature for comorbid hip pain. Discussed follow-up with MD if she begins to experience LE numbness/tingling or progressive weakness. Discussed f/u with MD  if symptoms aren't improving following 2-3 weeks.    Person(s) Educated Patient    Methods Explanation    Comprehension Verbalized understanding              PT Short Term Goals - 09/11/20 2340       PT SHORT TERM GOAL #1   Title Pt will be independent with HEP in order to decrease foot pain and improve mobility deficits and strength in order to improve pain-free function at home and work.    Baseline HEP provided and reviewed    Time 3    Period Weeks    Status New    Target Date 10/01/20      PT SHORT TERM GOAL #2   Title Patient will improve ankle dorsiflexion PROM to 10-15 deg or greater as needed for functional ankle mobility during gait.    Baseline Ankle dorsiflexion PROM R 8 deg, L 2 deg    Time 3    Period Weeks                PT Long Term Goals - 09/11/20 2344       PT LONG TERM GOAL #1   Title Patient will tolerate half of her work shift prior to mid-day break without pain >2/10    Baseline Significant pain following weightbearing activity throughout work day    Time 6    Period Weeks    Status New    Target Date 10/22/20      PT LONG TERM GOAL #2   Title Patient will demonstrate improved function as evidenced by a score of 61 on FOTO measure for full participation in activities at home and in the community.    Baseline FOTO score 6/8: 40    Time 6    Period Weeks    Status New    Target Date 10/22/20      PT LONG TERM GOAL #3   Title Patient willl perform push-sled with work-equivalent weight mimicking pushing tubs at work with proper body mechanics and no pain > 1-2/10 indicative of improved ability to perform high-level physical work tasks required    Baseline Pain with pushing rolling tubs at work    Time 6    Period Weeks    Status New    Target Date 10/22/20      PT LONG TERM GOAL #4   Title Patient will have WFL ankle and digit AROM and PROM as needed for gait, obstacle/stair negotiation, transferring, and squatting    Baseline Limited ankle and great toe mobility at IE    Time 6    Period Weeks    Status New    Target Date 10/22/20                   Plan - 09/30/20 1006     Clinical Impression Statement Patient arrives with minimal complaints of foot pain. Her current orthotic footwear is working well for weightbearing activity during patient's workday and during ADLs. She reports comorbid hip pain with history of bursitis without paresthesias or pain inferior to gluteal fold. Patient may need to follow-up with provider if this is not improving over the next 1-2 weeks. Patient tolerates low-level strengthening for intrinsic foot musculature and ankle stability drills well. Patient presents with remaining deficits in hallux and ankle mobility, plantarflexor  soft tissue mobility, motor control of arch, ankle ROM, and pain. Patient will benefit from continued skilled therapeutic intervention to address the above deficits  and activity limitations in prolonged walking, prolonged standing, and pushing/lifting activities for best return to PLOF and improved ability to perform work-related duties.    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 Motivation. Active lifestyle. Negative: bilateral LE involvement, laborous nature of work    PT Frequency 2x / week    PT Duration 6 weeks    PT Treatment/Interventions Electrical Stimulation;Therapeutic activities;Therapeutic exercise;Neuromuscular re-education;Manual techniques;Patient/family education;Passive range of motion;Dry needling    PT Next Visit Plan Manual therapy for improved ankle and hallux mobility, plantar soft tissue mobility and desensitization; intrinsic mm re-training and progression to graded loading as able    PT Home Exercise Plan Access Code [email or web address]    Consulted and Agree with Plan of Care Patient             Patient will benefit from skilled therapeutic intervention in order to improve the following deficits and impairments:  Abnormal gait, Decreased range of motion, Decreased strength, Hypomobility, Pain, Impaired flexibility, Difficulty walking  Visit Diagnosis: Pain in left foot  Pain in right foot  Difficulty in walking, not elsewhere classified     Problem List Patient Active Problem List   Diagnosis Date Noted   Hyperglycemia 07/23/2020    Sciatica 02/23/2020   Tinnitus 12/02/2019   Belching    Globus sensation    Hot flashes 07/01/2019   Loss of taste 02/05/2019   Headache 08/28/2018   Exposure to COVID-19 virus 08/24/2018   Chest pain 10/30/2017   Witnessed apneic spells 10/31/2016   Other neutropenia ([place]) 08/10/2016   Vertigo 07/28/2016   Health care maintenance 11/21/2014   History of colonic polyps 11/21/2014   Dysphagia 11/19/2014   Trochanteric bursitis of left hip 10/08/2014   URI (upper respiratory infection) 02/10/2014   Pelvic pain in female 12/25/2013   Personal history of ovarian cyst 12/25/2013   Change in bowel movement 11/11/2013   breast tenderness 09/29/2013   Dysphagia, unspecified(787.20) 06/12/2012   Barrett's esophagus 06/12/2012   Environmental allergies 02/13/2012   Enlarged thyroid 02/13/2012   GERD (gastroesophageal reflux disease) 02/08/2012   Leukopenia 02/08/2012   Fatigue 02/08/2012   Hypercholesterolemia 02/08/2012   Valentina Gu, PT, DPT #[id number] Eilleen Kempf 09/30/2020, 10:06 AM  [facility] [address] [place], [place], [address] Phone: [phone]   Fax:  [phone]  Name: VALERA VALLAS MRN: [id number] Date of Birth: [birth year]

## 2020-09-30 ENCOUNTER — Ambulatory Visit: Payer: 59 | Admitting: Internal Medicine

## 2020-09-30 VITALS — BP 130/74 | HR 63 | Temp 98.0°F | Resp 16 | Ht 66.0 in | Wt 195.0 lb

## 2020-09-30 DIAGNOSIS — E7800 Pure hypercholesterolemia, unspecified: Secondary | ICD-10-CM

## 2020-09-30 DIAGNOSIS — D708 Other neutropenia: Secondary | ICD-10-CM

## 2020-09-30 DIAGNOSIS — D72819 Decreased white blood cell count, unspecified: Secondary | ICD-10-CM | POA: Diagnosis not present

## 2020-09-30 DIAGNOSIS — K2100 Gastro-esophageal reflux disease with esophagitis, without bleeding: Secondary | ICD-10-CM | POA: Diagnosis not present

## 2020-09-30 DIAGNOSIS — K2270 Barrett's esophagus without dysplasia: Secondary | ICD-10-CM

## 2020-09-30 DIAGNOSIS — R739 Hyperglycemia, unspecified: Secondary | ICD-10-CM

## 2020-09-30 DIAGNOSIS — Z9109 Other allergy status, other than to drugs and biological substances: Secondary | ICD-10-CM

## 2020-09-30 NOTE — Progress Notes (Signed)
Patient ID: Kelly Massey, female   DOB: Sep 22, 1965, 55 y.o.   MRN: [id number]   Subjective:    Patient ID: Kelly Massey, female    DOB: [birth year], 55 y.o.   MRN: [id number]  HPI This visit occurred during the SARS-CoV-2 public health emergency.  Safety protocols were in place, including screening questions prior to the visit, additional usage of staff PPE, and extensive cleaning of exam room while observing appropriate contact time as indicated for disinfecting solutions.   Patient here for a scheduled follow up.  Here to follow-up regarding her reflux.  Also being followed for persistent leukopenia.  She is currently undergoing physical therapy for plantar fasciitis.  She has noticed over this past week some lower back and left hip pain.  Discussed exercises at home.  Also she will talk with physical therapy and if I need to add any orders to her current therapy she will tell them to send for me to sign.  Stays active.  Now that her plantar fasciitis is doing better she plans to start exercising more.  No chest pain or shortness of breath reported.  She is got her acid reflux under better control by watching her diet.  Does not eat late.  No abdominal pain.  Vitals stable.  She is planning to have a colonoscopy October 10, 2020.  She is scheduled for her mammogram.  Past Medical History:  Diagnosis Date   Allergy    Anemia    Arthritis    knees - no meds   Asthma    Colon polyps    GERD (gastroesophageal reflux disease) 10/12/10   EGD, positive H. pylori   HSV infection    History   Hyperlipidemia    ? no meds - diet controlled   Post-operative nausea and vomiting    Seasonal allergies    Past Surgical History:  Procedure Laterality Date   24 HOUR [place] STUDY N/A 07/25/2019   Procedure: 24 HOUR [place] STUDY;  Surgeon: Lavena Bullion, DO;  [facility];  Service: Gastroenterology;  Laterality: N/A;   ABDOMINAL HYSTERECTOMY     BALLOON DILATION N/A 07/24/2012   Procedure: BALLOON  DILATION;  Surgeon: Inda Castle, MD;  [facility];  Service: Endoscopy;  Laterality: N/A;   BRAVO [place] STUDY N/A 07/24/2012   Procedure: BRAVO [place] STUDY;  Surgeon: Inda Castle, MD;  [facility];  Service: Endoscopy;  Laterality: N/A;   DILATION AND CURETTAGE OF UTERUS     SAB   ESOPHAGEAL MANOMETRY N/A 07/25/2019   Procedure: ESOPHAGEAL MANOMETRY (EM);  Surgeon: Lavena Bullion, DO;  [facility];  Service: Gastroenterology;  Laterality: N/A;   ESOPHAGOGASTRODUODENOSCOPY N/A 07/24/2012   Procedure: ESOPHAGOGASTRODUODENOSCOPY (EGD);  Surgeon: Inda Castle, MD;  Location: [facility];  Service: Endoscopy;  Laterality: N/A;   OVARIAN CYST REMOVAL  2004   laparotomy -left   ROBOTIC ASSISTED LAPAROSCOPIC LYSIS OF ADHESION N/A 03/21/2014   Procedure: ROBOTIC ASSISTED LAPAROSCOPIC EXTENSIVE LYSIS OF ADHESION (1 Hour);  Surgeon: Marvene Staff, MD;  Location: [facility];  Service: Gynecology;  Laterality: N/A;   ROBOTIC ASSISTED SALPINGO OOPHERECTOMY Left 03/21/2014   Procedure:  ROBOTIC ASSISTED LEFT OOPHORECTOMY;  Surgeon: Marvene Staff, MD;  Location: [facility];  Service: Gynecology;  Laterality: Left;   rotator cuff surgery  2017   SHOULDER ARTHROSCOPY Left    TENNIS ELBOW RELEASE/NIRSCHEL PROCEDURE Right 05/01/2015   Procedure: RIGHT ELBOW DEBRIDEMENT AND TENDON REPAIR;  Surgeon: Quillian Quince  Dennie Bible, MD;  Location: [facility];  Service: Orthopedics;  Laterality: Right;   WISDOM TOOTH EXTRACTION     Family History  Problem Relation Age of Onset   Arthritis Mother    Stroke Mother    Hypertension Mother    Heart failure Mother    Dementia Mother    Breast cancer Maternal Aunt    Prostate cancer Maternal Uncle        x 2   Diabetes Sister    Diabetes Brother        x 2   Diabetes Paternal Grandmother    Hypertension Other    Hyperlipidemia Other    Diabetes Paternal Grandfather    Lung cancer Father    Diabetes Maternal Uncle     Throat cancer Maternal Aunt        Smoker   Esophageal cancer Maternal Aunt    Colon cancer Neg Hx    Stomach cancer Neg Hx    Rectal cancer Neg Hx    Social History   Socioeconomic History   Marital status: Married    Spouse name: Not on file   Number of children: 0   Years of education: Not on file   Highest education level: Not on file  Occupational History   Occupation: RESEARCH    Employer: [employer]  Tobacco Use   Smoking status: Never   Smokeless tobacco: Never  Vaping Use   Vaping Use: Never used  Substance and Sexual Activity   Alcohol use: Yes    Alcohol/week: 0.0 standard drinks    Comment: occassional   Drug use: No   Sexual activity: Yes    Birth control/protection: None    Comment: hysterectomy  Other Topics Concern   Not on file  Social History Narrative   Transport planner    Social Determinants of Health   Financial Resource Strain: Not on file  Food Insecurity: Not on file  Transportation Needs: Not on file  Physical Activity: Not on file  Stress: Not on file  Social Connections: Not on file     Review of Systems  Constitutional:  Negative for appetite change and unexpected weight change.       Has adjusted her diet.   HENT:  Negative for congestion and sinus pressure.   Respiratory:  Negative for cough, chest tightness and shortness of breath.   Cardiovascular:  Negative for chest pain, palpitations and leg swelling.  Gastrointestinal:  Negative for abdominal pain, diarrhea, nausea and vomiting.  Genitourinary:  Negative for difficulty urinating and dysuria.  Musculoskeletal:  Negative for joint swelling and myalgias.  Skin:  Negative for color change and rash.  Neurological:  Negative for dizziness, light-headedness and headaches.  Psychiatric/Behavioral:  Negative for agitation and dysphoric mood.       Objective:    Physical Exam Vitals reviewed.  Constitutional:      General: She is not in acute distress.    Appearance:  Normal appearance.  HENT:     Head: Normocephalic and atraumatic.     Right Ear: External ear normal.     Left Ear: External ear normal.  Eyes:     General: No scleral icterus.       Right eye: No discharge.        Left eye: No discharge.     Conjunctiva/sclera: Conjunctivae normal.  Neck:     Thyroid: No thyromegaly.  Cardiovascular:     Rate and Rhythm: Normal rate and regular rhythm.  Pulmonary:     Effort: No respiratory distress.     Breath sounds: Normal breath sounds. No wheezing.  Abdominal:     General: Bowel sounds are normal.     Palpations: Abdomen is soft.     Tenderness: There is no abdominal tenderness.  Musculoskeletal:        General: No swelling or tenderness.     Cervical back: Neck supple. No tenderness.  Lymphadenopathy:     Cervical: No cervical adenopathy.  Skin:    Findings: No erythema or rash.  Neurological:     Mental Status: She is alert.  Psychiatric:        Mood and Affect: Mood normal.        Behavior: Behavior normal.    BP 130/74   Pulse 63   Temp 98 F (36.7 C)   Resp 16   Ht 5\' 6"  (1.676 m)   Wt 195 lb (88.5 kg)   LMP 10/06/2011   SpO2 99%   BMI 31.47 kg/m  Wt Readings from Last 3 Encounters:  09/30/20 195 lb (88.5 kg)  08/14/20 195 lb (88.5 kg)  07/27/20 193 lb (87.5 kg)    Outpatient Encounter Medications as of 09/30/2020  Medication Sig   acyclovir (ZOVIRAX) 400 MG tablet TAKE 1 TABLET (400 MG TOTAL) BY MOUTH 2 (TWO) TIMES DAILY AS NEEDED.   benzonatate (TESSALON) 100 MG capsule Take 2 capsules (200 mg total) by mouth every 8 (eight) hours.   Calcium Carbonate-Vitamin D 600-400 MG-UNIT tablet Take 1 tablet by mouth daily.   diphenhydrAMINE (BENADRYL) 12.5 MG/5ML elixir Take by mouth as needed.   famotidine (PEPCID) 20 MG tablet TAKE 1 TABLET BY MOUTH TWICE A DAY   ferrous sulfate 325 (65 FE) MG tablet Take 325 mg by mouth daily.    Fluocinolone Acetonide Body 0.01 % OIL Apply to scalp before shampooing. Use for 6-10  hours with the cap each week to every other week.   ipratropium (ATROVENT) 0.06 % nasal spray Place 2 sprays into both nostrils 4 (four) times daily.   mometasone (NASONEX) 50 MCG/ACT nasal spray PLACE 2 SPRAYS INTO THE NOSE DAILY.   promethazine-dextromethorphan (PROMETHAZINE-DM) 6.25-15 MG/5ML syrup Take 5 mLs by mouth 4 (four) times daily as needed. (Patient taking differently: Take 5 mLs by mouth 4 (four) times daily as needed.)   Sodium Sulfate-Mag Sulfate-KCl (SUTAB) [phone] MG TABS Take 1 kit by mouth as directed. MANUFACTURER CODES BIN: [phone] PCN: CN GROUP: [id number] MEMBER ID: [id number] AS SECONDARY INSURANCE ;NO PRIOR AUTHORIZATION   vitamin E 400 UNIT capsule Take 400 Units by mouth daily. Reported on 10/09/2015   Facility-Administered Encounter Medications as of 09/30/2020  Medication   0.9 %  sodium chloride infusion     Lab Results  Component Value Date   WBC 2.6 (L) 08/26/2020   HGB 14.0 08/26/2020   HCT 42.1 08/26/2020   PLT 218.0 08/26/2020   GLUCOSE 99 08/26/2020   CHOL 215 (H) 08/26/2020   TRIG 88.0 08/26/2020   HDL 81.00 08/26/2020   LDLCALC 116 (H) 08/26/2020   ALT 15 08/26/2020   AST 17 08/26/2020   NA 140 08/26/2020   K 4.6 08/26/2020   CL 103 08/26/2020   CREATININE 0.85 08/26/2020   BUN 7 08/26/2020   CO2 29 08/26/2020   TSH 1.217 06/20/2020   HGBA1C 6.2 08/26/2020       Assessment & Plan:   Problem List Items Addressed This Visit     Barrett's esophagus  Followed by GI.        Environmental allergies    Continue antihistamine and nasal sprays.  Follow.        GERD (gastroesophageal reflux disease)    Under better control since adjusting her diet.  Continue pepcid.  Has seen GI.  Has had extensive w/up.  Follow.        Hypercholesterolemia - Primary    The 10-year ASCVD risk score Mikey Bussing DC Jr., et al., 2013) is: 2.5%   Values used to calculate the score:     Age: 51 years     Sex: Female     Is Non-Hispanic African  American: Yes     Diabetic: No     Tobacco smoker: No     Systolic Blood Pressure: 840 mmHg     Is BP treated: No     HDL Cholesterol: 81 mg/dL     Total Cholesterol: 215 mg/dL  Low cholesterol diet and exercise.  Follow lipid panel.        Relevant Orders   Lipid panel   Basic metabolic panel   Hepatic function panel   Hyperglycemia    Watching her diet.  Low carb diet and exercise.  Follow met b and a1c.        Relevant Orders   Hemoglobin A1c   Leukopenia   Relevant Orders   CBC with Differential/Platelet   Other neutropenia ([place])    Has been worked up by hematology.  Felt related to PPI.  Follow cbc.          Einar Pheasant, MD

## 2020-10-01 ENCOUNTER — Encounter: Payer: 59 | Admitting: Physical Therapy

## 2020-10-06 ENCOUNTER — Encounter: Payer: Self-pay | Admitting: Internal Medicine

## 2020-10-06 NOTE — Assessment & Plan Note (Signed)
Followed by GI

## 2020-10-06 NOTE — Assessment & Plan Note (Signed)
The 10-year ASCVD risk score Mikey Bussing DC Brooke Bonito., et al., 2013) is: 2.5%   Values used to calculate the score:     Age: 55 years     Sex: Female     Is Non-Hispanic African American: Yes     Diabetic: No     Tobacco smoker: No     Systolic Blood Pressure: 893 mmHg     Is BP treated: No     HDL Cholesterol: 81 mg/dL     Total Cholesterol: 215 mg/dL  Low cholesterol diet and exercise.  Follow lipid panel.

## 2020-10-06 NOTE — Assessment & Plan Note (Signed)
Under better control since adjusting her diet.  Continue pepcid.  Has seen GI.  Has had extensive w/up.  Follow.

## 2020-10-06 NOTE — Assessment & Plan Note (Signed)
Watching her diet.  Low carb diet and exercise.  Follow met b and a1c.  ?

## 2020-10-06 NOTE — Assessment & Plan Note (Signed)
Continue antihistamine and nasal sprays.  Follow.

## 2020-10-06 NOTE — Assessment & Plan Note (Signed)
Has been worked up by hematology.  Felt related to PPI.  Follow cbc.

## 2020-10-08 ENCOUNTER — Encounter: Payer: Self-pay | Admitting: Physical Therapy

## 2020-10-08 ENCOUNTER — Encounter: Payer: 59 | Admitting: Physical Therapy

## 2020-10-08 ENCOUNTER — Ambulatory Visit: Payer: 59 | Attending: Podiatry | Admitting: Physical Therapy

## 2020-10-08 ENCOUNTER — Other Ambulatory Visit: Payer: Self-pay

## 2020-10-08 DIAGNOSIS — M79672 Pain in left foot: Secondary | ICD-10-CM | POA: Diagnosis not present

## 2020-10-08 DIAGNOSIS — M79671 Pain in right foot: Secondary | ICD-10-CM

## 2020-10-08 DIAGNOSIS — R262 Difficulty in walking, not elsewhere classified: Secondary | ICD-10-CM

## 2020-10-08 NOTE — Therapy (Signed)
[facility] [address] [place], [place], [address] Phone: [phone]   Fax:  [phone]  Physical Therapy Treatment  Patient Details  Name: Kelly Massey MRN: [id number] Date of Birth: [birth year] Referring Provider (PT): Criselda Peaches, DPM   Encounter Date: 10/08/2020   PT End of Session - 10/09/20 1717     Visit Number 4    Number of Visits 16    Date for PT Re-Evaluation 12/31/20    Authorization - Visit Number 4    Authorization - Number of Visits 10    Progress Note Due on Visit 10    PT Start Time 1330    PT Stop Time 1414    PT Time Calculation (min) 44 min    Activity Tolerance Patient tolerated treatment well;No increased pain    Behavior During Therapy WFL for tasks assessed/performed             Past Medical History:  Diagnosis Date   Allergy    Anemia    Arthritis    knees - no meds   Asthma    Colon polyps    GERD (gastroesophageal reflux disease) 10/12/10   EGD, positive H. pylori   HSV infection    History   Hyperlipidemia    ? no meds - diet controlled   Post-operative nausea and vomiting    Seasonal allergies     Past Surgical History:  Procedure Laterality Date   24 HOUR [place] STUDY N/A 07/25/2019   Procedure: 24 HOUR [place] STUDY;  Surgeon: Lavena Bullion, DO;  Location: [facility];  Service: Gastroenterology;  Laterality: N/A;   ABDOMINAL HYSTERECTOMY     BALLOON DILATION N/A 07/24/2012   Procedure: BALLOON DILATION;  Surgeon: Inda Castle, MD;  Location: [facility];  Service: Endoscopy;  Laterality: N/A;   BRAVO [place] STUDY N/A 07/24/2012   Procedure: BRAVO [place] STUDY;  Surgeon: Inda Castle, MD;  Location: [facility];  Service: Endoscopy;  Laterality: N/A;   DILATION AND CURETTAGE OF UTERUS     SAB   ESOPHAGEAL MANOMETRY N/A 07/25/2019   Procedure: ESOPHAGEAL MANOMETRY (EM);  Surgeon: Lavena Bullion, DO;  Location: [facility];  Service: Gastroenterology;  Laterality: N/A;    ESOPHAGOGASTRODUODENOSCOPY N/A 07/24/2012   Procedure: ESOPHAGOGASTRODUODENOSCOPY (EGD);  Surgeon: Inda Castle, MD;  Location: [facility];  Service: Endoscopy;  Laterality: N/A;   OVARIAN CYST REMOVAL  2004   laparotomy -left   ROBOTIC ASSISTED LAPAROSCOPIC LYSIS OF ADHESION N/A 03/21/2014   Procedure: ROBOTIC ASSISTED LAPAROSCOPIC EXTENSIVE LYSIS OF ADHESION (1 Hour);  Surgeon: Marvene Staff, MD;  Location: [facility];  Service: Gynecology;  Laterality: N/A;   ROBOTIC ASSISTED [place] OOPHERECTOMY Left 03/21/2014   Procedure:  ROBOTIC ASSISTED LEFT OOPHORECTOMY;  Surgeon: Marvene Staff, MD;  Location: [facility];  Service: Gynecology;  Laterality: Left;   rotator cuff surgery  2017   SHOULDER ARTHROSCOPY Left    TENNIS ELBOW RELEASE/NIRSCHEL PROCEDURE Right 05/01/2015   Procedure: RIGHT ELBOW DEBRIDEMENT AND TENDON REPAIR;  Surgeon: [name], MD;  Location: [facility];  Service: Orthopedics;  Laterality: Right;   WISDOM TOOTH EXTRACTION      There were no vitals filed for this visit.   Subjective Assessment - 10/08/20 1331     Subjective Patient denies notable pain at arrival to PT. Patient reports notable difficulty with pushing uphill, especially if trying to push the cart faster. She states she seldom has  to perform this. Patient reports standing no more than 5-10 minutes at a time at work prior to sitting down, but she does not report having to do this secondary to pain(she sits once she is done completing task). She reports tolerating work well recently. Patient reports compliance with her HEP.    Pertinent History Patient is a 55 year old female with primary complaint of plantar foot pain. Pt is s/p cortisone injection and prednisone taper. She states that symptoms returned after about 3 days. Patient reports pain in L foot more than R foot. Patient reportsnotable pain with prolonged walking. Pt reports insidious onset without trauma. Patient reports that  sypmtoms began as minor pinch in foot and got worrse. She reports pain beginning around [facility] [address] 2022. Pt describes pain as shooting, sharp; she reports sometimes having aching pain in the evening. Patient reports that bracing does help, but "not a lot." Pain is near calcaneal tubercle region in plantar foot, L more affected than R. Patient denies numbness or paresthesia. Worse in the AM. 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 Patient is still working full-time. No night pain.    Limitations Lifting;House hold activities    Patient Stated Goals Patient wants to be pain-free, able to walk outside/regular walking program, gardening/flower bed    Pain Onset More than a month ago             PROM DF: R 2 deg, L 5 deg Great toe: R 70 deg, L 74 deg  AROM DF: R 2 deg, L 2 deg     Treatments Performed   Manual Therapy - for soft tissue mobility, talocrural and 1st MTP joint mobility, symptom modulation (Performed bilaterally)  1st MTP dorsal mobilization gr III for improved extension mobility Posterior talocrural mobilization gr III-IV for improved ankle mobility   Manual dorsiflexion PROM with gentle overpressure at end-range Gentle dorsiflexion stretching dynamically with manual overpressure on prone with combined knee flexion  *not today* DTM and IASTM plantar arch and medial>lateral gastrocnemius.    Therapeutic Exercise - for great toe and ankle mobility, plantarflexor soft tissue mobility, intrinsic mm strengthening   Great toe stretch at wall, lunge; 1x60sec bilateral [verbal cueing for technique] Towel scrunch with 2-lb Dumbbell; 2x1 min bilateral Heel raise with great toe extension on towel roll, bilateral raise; 2x10 with slow eccentric   Patient education: HEP review and update to include plantar fascia graded loading (heel raise with great toe extension)    [*not  today*] Tandem stance; 2x30 sec, bilat (step handrails for UE support as needed) Short foot; 2x10, bilaterally [verbal/tactile cueing and demo for technique]      Patient response to intervention Patient denies increase in pain and reports no significant symptoms throughout session. Patient has good hallux extension bilaterally, but ongoing significant dorsiflexion stiffness     ASSESSMENT Patient arrives with minimal complaints of foot pain. Patient has limited dorsiflexion with knee both extended and flexed. She has significantly improved great toe mobility; dorsiflexion has modestly improved. She has tolerated intermittent weightbearing at work well recently. Pt is able to progress to graded loading program for plantar fascia and tolerates this additional exercise in clinic well today. Patient presents with remaining deficits in hallux and ankle mobility, plantarflexor soft tissue mobility, motor control of arch, ankle ROM, and intermittent pain with heavier pushing at work. Patient will benefit from continued skilled therapeutic intervention to address the above deficits and  activity limitations in prolonged walking, prolonged standing, and pushing/lifting activities for best return to PLOF and improved ability to perform work-related duties.      PT Short Term Goals - 09/11/20 2340       PT SHORT TERM GOAL #1   Title Pt will be independent with HEP in order to decrease foot pain and improve mobility deficits and strength in order to improve pain-free function at home and work.    Baseline HEP provided and reviewed    Time 3    Period Weeks    Status New    Target Date 10/01/20      PT SHORT TERM GOAL #2   Title Patient will improve ankle dorsiflexion PROM to 10-15 deg or greater as needed for functional ankle mobility during gait.    Baseline Ankle dorsiflexion PROM R 8 deg, L 2 deg    Time 3    Period Weeks               PT Long Term Goals - 09/11/20 2344       PT LONG  TERM GOAL #1   Title Patient will tolerate half of her work shift prior to mid-day break without pain >2/10    Baseline Significant pain following weightbearing activity throughout work day    Time 6    Period Weeks    Status New    Target Date 10/22/20      PT LONG TERM GOAL #2   Title Patient will demonstrate improved function as evidenced by a score of 61 on FOTO measure for full participation in activities at home and in the community.    Baseline FOTO score 6/8: 40    Time 6    Period Weeks    Status New    Target Date 10/22/20      PT LONG TERM GOAL #3   Title Patient willl perform push-sled with work-equivalent weight mimicking pushing tubs at work with proper body mechanics and no pain > 1-2/10 indicative of improved ability to perform high-level physical work tasks required    Baseline Pain with pushing rolling tubs at work    Time 6    Period Weeks    Status New    Target Date 10/22/20      PT LONG TERM GOAL #4   Title Patient will have WFL ankle and digit AROM and PROM as needed for gait, obstacle/stair negotiation, transferring, and squatting    Baseline Limited ankle and great toe mobility at IE    Time 6    Period Weeks    Status New    Target Date 10/22/20                   Plan - 10/09/20 1723     Clinical Impression Statement Patient arrives with minimal complaints of foot pain. Patient has limited dorsiflexion with knee both extended and flexed. She has significantly improved great toe mobility; dorsiflexion has modestly improved. She has tolerated intermittent weightbearing at work well recently. Pt is able to progress to graded loading program for plantar fascia and tolerates this additional exercise in clinic well today. Patient presents with remaining deficits in hallux and ankle mobility, plantarflexor soft tissue mobility, motor control of arch, ankle ROM, and intermittent pain with heavier pushing at work. Patient will benefit from continued  skilled therapeutic intervention to address the above deficits and activity limitations in prolonged walking, prolonged standing, and pushing/lifting activities for best return to PLOF  and improved ability to perform work-related duties.    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 Motivation. Active lifestyle. Negative: bilateral LE involvement, laborous nature of work    PT Frequency 2x / week    PT Duration 6 weeks    PT Treatment/Interventions Electrical Stimulation;Therapeutic activities;Therapeutic exercise;Neuromuscular re-education;Manual techniques;Patient/family education;Passive range of motion;Dry needling    PT Next Visit Plan Manual therapy for improved ankle and hallux mobility, plantar soft tissue mobility and desensitization; intrinsic mm re-training and progression to graded loading as able    PT Home Exercise Plan Access Code [email or web address]    Consulted and Agree with Plan of Care Patient             Patient will benefit from skilled therapeutic intervention in order to improve the following deficits and impairments:  Abnormal gait, Decreased range of motion, Decreased strength, Hypomobility, Pain, Impaired flexibility, Difficulty walking  Visit Diagnosis: Pain in left foot  Pain in right foot  Difficulty in walking, not elsewhere classified     Problem List Patient Active Problem List   Diagnosis Date Noted   Hyperglycemia 07/23/2020   Sciatica 02/23/2020   Tinnitus 12/02/2019   Belching    Globus sensation    Hot flashes 07/01/2019    Loss of taste 02/05/2019   Headache 08/28/2018   Exposure to COVID-19 virus 08/24/2018   Chest pain 10/30/2017   Witnessed apneic spells 10/31/2016   Other neutropenia ([place]) 08/10/2016   Vertigo 07/28/2016   Health care maintenance 11/21/2014   History of colonic polyps 11/21/2014   Dysphagia 11/19/2014   Trochanteric bursitis of left hip 10/08/2014   URI (upper respiratory infection) 02/10/2014   Pelvic pain in female 12/25/2013   Personal history of ovarian cyst 12/25/2013   Change in bowel movement 11/11/2013   breast tenderness 09/29/2013   Dysphagia, unspecified(787.20) 06/12/2012   Barrett's esophagus 06/12/2012   Environmental allergies 02/13/2012   Enlarged thyroid 02/13/2012   GERD (gastroesophageal reflux disease) 02/08/2012   Leukopenia 02/08/2012   Fatigue 02/08/2012   Hypercholesterolemia 02/08/2012   Valentina Gu, PT, DPT #[id number] Eilleen Kempf 10/09/2020, 5:24 PM  [facility] [address] [place], [place], [address] Phone: [phone]   Fax:  [phone]  Name: Kelly Massey MRN: [id number] Date of Birth: 1965/08/09

## 2020-10-08 NOTE — Patient Instructions (Signed)
      Treatments Performed   Manual Therapy - for soft tissue mobility, talocrural and 1st MTP joint mobility, symptom modulation (Performed bilaterally) DTM and IASTM plantar arch and medial>lateral gastrocnemius. 1st MTP dorsal mobilization gr III for improved extension mobility Posterior talocrural mobilization gr III-IV for improved ankle mobility   Manual dorsiflexion PROM with gentle overpressure at end-range   Therapeutic Exercise - for great toe and ankle mobility, plantarflexor soft tissue mobility, intrinsic mm strengthening   Great toe stretch at wall, lunge; 3x30sec bilateral [verbal cueing for technique] Towel scrunch with 1-lb Dumbbell; 2x1 min bilateral Tandem stance; 2x30 sec, bilat (step handrails for UE support as needed)   Patient education: HEP review and update to include intrinsic strengthening with mild resistance and hallux extension stretching at wall       [*not today*] Short foot; 2x10, bilaterally [verbal/tactile cueing and demo for technique]     Patient response to intervention Patient denies remarkable foot pain at end of session. Patient exhibits normalizing hallux extension mobility and improving bilateral ankle dorsiflexion (though patient still has significant dorsiflexion deficit bilaterally)     ASSESSMENT Patient arrives with minimal complaints of foot pain. Her current orthotic footwear is working well for weightbearing activity during patient's workday and during ADLs. She reports comorbid hip pain with history of bursitis without paresthesias or pain inferior to gluteal fold. Patient may need to follow-up with provider if this is not improving over the next 1-2 weeks. Patient tolerates low-level strengthening for intrinsic foot musculature and ankle stability drills well. Patient presents with remaining deficits in hallux and ankle mobility, plantarflexor soft tissue mobility, motor control of arch, ankle ROM, and pain. Patient will benefit from  continued skilled therapeutic intervention to address the above deficits and activity limitations in prolonged walking, prolonged standing, and pushing/lifting activities for best return to PLOF and improved ability to perform work-related duties.

## 2020-10-10 ENCOUNTER — Other Ambulatory Visit: Payer: Self-pay

## 2020-10-10 ENCOUNTER — Encounter: Payer: 59 | Admitting: Gastroenterology

## 2020-10-10 ENCOUNTER — Ambulatory Visit: Payer: Self-pay

## 2020-10-10 ENCOUNTER — Ambulatory Visit
Admission: RE | Admit: 2020-10-10 | Discharge: 2020-10-10 | Disposition: A | Discharge status: Home / Self Care | Payer: 59 | Source: Ambulatory Visit | Attending: Physician Assistant | Admitting: Physician Assistant

## 2020-10-10 ENCOUNTER — Telehealth: Payer: Self-pay | Admitting: Gastroenterology

## 2020-10-10 VITALS — BP 143/93 | HR 108 | Temp 99.8°F | Resp 18 | Ht 66.0 in | Wt 188.0 lb

## 2020-10-10 DIAGNOSIS — J069 Acute upper respiratory infection, unspecified: Secondary | ICD-10-CM | POA: Insufficient documentation

## 2020-10-10 DIAGNOSIS — Z880 Allergy status to penicillin: Secondary | ICD-10-CM | POA: Diagnosis not present

## 2020-10-10 DIAGNOSIS — R0981 Nasal congestion: Secondary | ICD-10-CM | POA: Diagnosis not present

## 2020-10-10 DIAGNOSIS — Z8616 Personal history of COVID-19: Secondary | ICD-10-CM | POA: Diagnosis not present

## 2020-10-10 DIAGNOSIS — R509 Fever, unspecified: Secondary | ICD-10-CM | POA: Diagnosis present

## 2020-10-10 DIAGNOSIS — R059 Cough, unspecified: Secondary | ICD-10-CM | POA: Insufficient documentation

## 2020-10-10 DIAGNOSIS — U071 COVID-19: Secondary | ICD-10-CM | POA: Diagnosis not present

## 2020-10-10 LAB — RESP PANEL BY RT-PCR (FLU A&B, COVID) ARPGX2
Influenza A by PCR: NEGATIVE
Influenza B by PCR: NEGATIVE
SARS Coronavirus 2 by RT PCR: POSITIVE — AB

## 2020-10-10 MED ORDER — BENZONATATE 100 MG PO CAPS: 200.0000 mg | ORAL_CAPSULE | Freq: Three times a day (TID) | ORAL | 0 refills | Status: DC

## 2020-10-10 MED ORDER — FLUTICASONE PROPIONATE 50 MCG/ACT NA SUSP: 2.0000 | Freq: Every day | NASAL | 0 refills | Status: AC

## 2020-10-10 NOTE — ED Provider Notes (Addendum)
[facility]    CSN: [id number] Arrival date & time: 10/10/20  1149      History   Chief Complaint Chief Complaint  Patient presents with   Fever   Nasal Congestion   Appointment    HPI Kelly Massey is a 55 y.o. female.   HPI  Cold Symptoms: Patient reports that about 2 days ago she started having nasal congestion which then followed by low-grade fever.  She states that she also has had chills and body aches along with a mild dry cough and mild sore throat.  She has not tried anything for symptoms yet as she was originally prepping for colonoscopy which she is canceled.  She denies any known sick contacts.  No chest pain, shortness of breath, wheezing.   Past Medical History:  Diagnosis Date   Allergy    Anemia    Arthritis    knees - no meds   Asthma    Colon polyps    GERD (gastroesophageal reflux disease) 10/12/10   EGD, positive H. pylori   HSV infection    History   Hyperlipidemia    ? no meds - diet controlled   Post-operative nausea and vomiting    Seasonal allergies     Patient Active Problem List   Diagnosis Date Noted   Hyperglycemia 07/23/2020   Sciatica 02/23/2020   Tinnitus 12/02/2019   Belching    Globus sensation    Hot flashes 07/01/2019   Loss of taste 02/05/2019   Headache 08/28/2018   Exposure to COVID-19 virus 08/24/2018   Chest pain 10/30/2017   Witnessed apneic spells 10/31/2016   Other neutropenia ([place]) 08/10/2016   Vertigo 07/28/2016   Health care maintenance 11/21/2014   History of colonic polyps 11/21/2014   Dysphagia 11/19/2014   Trochanteric bursitis of left hip 10/08/2014   URI (upper respiratory infection) 02/10/2014   Pelvic pain in female 12/25/2013   Personal history of ovarian cyst 12/25/2013   Change in bowel movement 11/11/2013   breast tenderness 09/29/2013   Dysphagia, unspecified(787.20) 06/12/2012   Barrett's esophagus 06/12/2012   Environmental allergies 02/13/2012   Enlarged thyroid 02/13/2012    GERD (gastroesophageal reflux disease) 02/08/2012   Leukopenia 02/08/2012   Fatigue 02/08/2012   Hypercholesterolemia 02/08/2012    Past Surgical History:  Procedure Laterality Date   24 HOUR [place] STUDY N/A 07/25/2019   Procedure: 24 HOUR [place] STUDY;  Surgeon: Lavena Bullion, DO;  Location: [facility];  Service: Gastroenterology;  Laterality: N/A;   ABDOMINAL HYSTERECTOMY     BALLOON DILATION N/A 07/24/2012   Procedure: BALLOON DILATION;  Surgeon: Inda Castle, MD;  Location: [facility];  Service: Endoscopy;  Laterality: N/A;   BRAVO [place] STUDY N/A 07/24/2012   Procedure: BRAVO [place] STUDY;  Surgeon: Inda Castle, MD;  Location: [facility];  Service: Endoscopy;  Laterality: N/A;   DILATION AND CURETTAGE OF UTERUS     SAB   ESOPHAGEAL MANOMETRY N/A 07/25/2019   Procedure: ESOPHAGEAL MANOMETRY (EM);  Surgeon: Lavena Bullion, DO;  Location: [facility];  Service: Gastroenterology;  Laterality: N/A;   ESOPHAGOGASTRODUODENOSCOPY N/A 07/24/2012   Procedure: ESOPHAGOGASTRODUODENOSCOPY (EGD);  Surgeon: Inda Castle, MD;  Location: [facility];  Service: Endoscopy;  Laterality: N/A;   OVARIAN CYST REMOVAL  2004   laparotomy -left   ROBOTIC ASSISTED LAPAROSCOPIC LYSIS OF ADHESION N/A 03/21/2014   Procedure: ROBOTIC ASSISTED LAPAROSCOPIC EXTENSIVE LYSIS OF ADHESION (1 Hour);  Surgeon: Marvene Staff, MD;  Location: [facility];  Service: Gynecology;  Laterality: N/A;   ROBOTIC ASSISTED SALPINGO OOPHERECTOMY Left 03/21/2014   Procedure:  ROBOTIC ASSISTED LEFT OOPHORECTOMY;  Surgeon: Marvene Staff, MD;  Location: [facility];  Service: Gynecology;  Laterality: Left;   rotator cuff surgery  2017   SHOULDER ARTHROSCOPY Left    TENNIS ELBOW RELEASE/NIRSCHEL PROCEDURE Right 05/01/2015   Procedure: RIGHT ELBOW DEBRIDEMENT AND TENDON REPAIR;  Surgeon: [name], MD;  Location: [facility];  Service: Orthopedics;  Laterality: Right;   WISDOM TOOTH EXTRACTION      OB  History   No obstetric history on file.      Home Medications    Prior to Admission medications   Medication Sig Start Date End Date Taking? Authorizing Provider  acyclovir (ZOVIRAX) 400 MG tablet TAKE 1 TABLET (400 MG TOTAL) BY MOUTH 2 (TWO) TIMES DAILY AS NEEDED. 09/29/20  Yes Einar Pheasant, MD  Calcium Carbonate-Vitamin D 600-400 MG-UNIT tablet Take 1 tablet by mouth daily.   Yes [provider]  diphenhydrAMINE (BENADRYL) 12.5 MG/5ML elixir Take by mouth as needed.   Yes [provider]  famotidine (PEPCID) 20 MG tablet TAKE 1 TABLET BY MOUTH TWICE A DAY 09/02/20  Yes Armbruster, Carlota Raspberry, MD  ferrous sulfate 325 (65 FE) MG tablet Take 325 mg by mouth daily.    Yes [provider]  mometasone (NASONEX) 50 MCG/ACT nasal spray PLACE 2 SPRAYS INTO THE NOSE DAILY. 09/02/20  Yes Einar Pheasant, MD  vitamin E 400 UNIT capsule Take 400 Units by mouth daily. Reported on 10/09/2015   Yes [provider]  benzonatate (TESSALON) 100 MG capsule Take 2 capsules (200 mg total) by mouth every 8 (eight) hours. 07/27/20   Margarette Canada, NP  Fluocinolone Acetonide Body 0.01 % OIL Apply to scalp before shampooing. Use for 6-10 hours with the cap each week to every other week. 08/19/16   [provider]  ipratropium (ATROVENT) 0.06 % nasal spray Place 2 sprays into both nostrils 4 (four) times daily. 07/27/20   Margarette Canada, NP  promethazine-dextromethorphan (PROMETHAZINE-DM) 6.25-15 MG/5ML syrup Take 5 mLs by mouth 4 (four) times daily as needed. Patient taking differently: Take 5 mLs by mouth 4 (four) times daily as needed. 07/27/20   Margarette Canada, NP  Sodium Sulfate-Mag Sulfate-KCl (SUTAB) [phone] MG TABS Take 1 kit by mouth as directed. MANUFACTURER CODES BIN: [id number] PCN: CN GROUP: [id number] MEMBER ID: [id number];[facility] AS SECONDARY INSURANCE ;NO PRIOR AUTHORIZATION 08/14/20   Noralyn Pick, NP    Family History Family History  Problem Relation Age of  Onset   Arthritis Mother    Stroke Mother    Hypertension Mother    Heart failure Mother    Dementia Mother    Breast cancer Maternal Aunt    Prostate cancer Maternal Uncle        x 2   Diabetes Sister    Diabetes Brother        x 2   Diabetes Paternal Grandmother    Hypertension Other    Hyperlipidemia Other    Diabetes Paternal Grandfather    Lung cancer Father    Diabetes Maternal Uncle    Throat cancer Maternal Aunt        Smoker   Esophageal cancer Maternal Aunt    Colon cancer Neg Hx    Stomach cancer Neg Hx    Rectal cancer Neg Hx     Social History Social History   Tobacco Use  Smoking status: Never   Smokeless tobacco: Never  Vaping Use   Vaping Use: Never used  Substance Use Topics   Alcohol use: Yes    Alcohol/week: 0.0 standard drinks    Comment: occassional   Drug use: No     Allergies   Adhesive [tape], Aspirin, Ibuprofen, Penicillins, Nsaids, Oxycodone, and Shrimp [shellfish allergy]   Review of Systems Review of Systems  As stated above in HPI Physical Exam Triage Vital Signs ED Triage Vitals  Enc Vitals Group     BP 10/10/20 1211 (!) 143/93     Pulse Rate 10/10/20 1211 (!) 108     Resp 10/10/20 1211 18     Temp 10/10/20 1211 99.8 F (37.7 C)     Temp Source 10/10/20 1211 Oral     SpO2 10/10/20 1211 100 %     Weight 10/10/20 1212 188 lb (85.3 kg)     Height 10/10/20 1212 _0  (1.676 m)     Head Circumference --      Peak Flow --      Pain Score 10/10/20 1212 0     Pain Loc --      Pain Edu? --      Excl. in [place]? --    No data found.  Updated Vital Signs BP (!) 143/93 (BP Location: Right Arm)   Pulse (!) 108   Temp 99.8 F (37.7 C) (Oral)   Resp 18   Ht _1  (1.676 m)   Wt 188 lb (85.3 kg)   LMP 10/06/2011   SpO2 100%   BMI 30.34 kg/m    Physical Exam Vitals and nursing note reviewed.  Constitutional:      General: She is not in acute distress.    Appearance: Normal appearance. She is not ill-appearing,  toxic-appearing or diaphoretic.  HENT:     Head: Normocephalic and atraumatic.     Right Ear: Tympanic membrane normal.     Left Ear: Tympanic membrane normal.     Nose: Congestion present.     Mouth/Throat:     Mouth: Mucous membranes are moist.     Pharynx: Oropharynx is clear. No oropharyngeal exudate or posterior oropharyngeal erythema.  Eyes:     Extraocular Movements: Extraocular movements intact.     Pupils: Pupils are equal, round, and reactive to light.  Cardiovascular:     Rate and Rhythm: Normal rate and regular rhythm.     Pulses: Normal pulses.     Heart sounds: Normal heart sounds.  Pulmonary:     Effort: Pulmonary effort is normal.     Breath sounds: Normal breath sounds.  Musculoskeletal:     Cervical back: Normal range of motion and neck supple.  Lymphadenopathy:     Cervical: No cervical adenopathy.  Skin:    General: Skin is warm.  Neurological:     Mental Status: She is alert.     UC Treatments / Results  Labs (all labs ordered are listed, but only abnormal results are displayed) Labs Reviewed  RESP PANEL BY RT-PCR (FLU A&B, COVID) [id number]    EKG   Radiology No results found.  Procedures Procedures (including critical care time)  Medications Ordered in UC Medications - No data to display  Initial Impression / Assessment and Plan / UC Course  I have reviewed the triage vital signs and the nursing notes.  Pertinent labs & imaging results that were available during my care of the patient were reviewed by me and considered in  my medical decision making (see chart for details).     New.  Likely viral in nature.  COVID and influenza testing pending.  For now I have recommended rest, hydration, O2 monitoring as needed.  We discussed red flag signs and symptoms.  Sending her in [place] and Flonase to help with symptoms.  Final Clinical Impressions(s) / UC Diagnoses   Final diagnoses:  None   Discharge Instructions   None    ED  Prescriptions   None    PDMP not reviewed this encounter.   Hughie Closs, PA-C 10/10/20 1239    Hughie Closs, PA-C 10/10/20 [place], PA-C 10/10/20 1339

## 2020-10-10 NOTE — ED Triage Notes (Signed)
Pt c/o fever (100.8), sore throat, nasal congestion, chills, cough. Started about 2 days ago. She took a home covid test and was negative.

## 2020-10-10 NOTE — Telephone Encounter (Signed)
Patient calling to inform she is cancelling colon procedure with Dr. Havery Moros today at 3:00pm  Patient states she has a fever, not feeling well. Patient states she will reschedule when she is feeling better.  Thank you

## 2020-10-13 ENCOUNTER — Ambulatory Visit: Payer: 59 | Admitting: Physical Therapy

## 2020-10-13 ENCOUNTER — Inpatient Hospital Stay: Admission: RE | Admit: 2020-10-13 | Payer: 59 | Source: Ambulatory Visit

## 2020-10-13 NOTE — Telephone Encounter (Signed)
Dr. Havery Moros, per the information that I could gathered from Epic, it looks like patient was always scheduled for 10/10/20 at 3:00 pm. Thank you.

## 2020-10-13 NOTE — Telephone Encounter (Signed)
Hi Dr. Havery Moros, pt called again to let you know that she tested positive for Covid. She will cb to r/s procedure. Thank you.

## 2020-10-13 NOTE — Telephone Encounter (Signed)
Thank you for letting me know, she can reschedule at her convenience, however I did not see her on my schedule today for this, there is another patient scheduled in that slot. Can you confirm when her date / time was for this initially? Thanks

## 2020-10-14 ENCOUNTER — Ambulatory Visit: Payer: 59 | Admitting: Podiatry

## 2020-10-15 ENCOUNTER — Encounter: Payer: 59 | Admitting: Physical Therapy

## 2020-10-20 ENCOUNTER — Other Ambulatory Visit: Payer: Self-pay

## 2020-10-20 ENCOUNTER — Ambulatory Visit: Payer: 59 | Admitting: Physical Therapy

## 2020-10-20 ENCOUNTER — Encounter: Payer: Self-pay | Admitting: Physical Therapy

## 2020-10-20 ENCOUNTER — Ambulatory Visit
Admission: RE | Admit: 2020-10-20 | Discharge: 2020-10-20 | Disposition: A | Discharge status: Home / Self Care | Payer: 59 | Source: Ambulatory Visit | Attending: Internal Medicine | Admitting: Internal Medicine

## 2020-10-20 DIAGNOSIS — M79671 Pain in right foot: Secondary | ICD-10-CM

## 2020-10-20 DIAGNOSIS — R262 Difficulty in walking, not elsewhere classified: Secondary | ICD-10-CM

## 2020-10-20 DIAGNOSIS — Z1231 Encounter for screening mammogram for malignant neoplasm of breast: Secondary | ICD-10-CM

## 2020-10-20 DIAGNOSIS — M79672 Pain in left foot: Secondary | ICD-10-CM | POA: Diagnosis not present

## 2020-10-20 NOTE — Therapy (Signed)
[facility] [address] [place], [place], [address] Phone: [phone]   Fax:  [phone]  Physical Therapy Treatment Physical Therapy Progress Note   Dates of reporting period  09/10/20   to   10/20/20  Patient Details  Name: Kelly Massey MRN: [id number] Date of Birth: 1965-11-29 Referring Provider (PT): Criselda Peaches, DPM   Encounter Date: 10/20/2020   PT End of Session - 10/20/20 2247     Visit Number 5    Number of Visits 16    Date for PT Re-Evaluation 12/31/20    Authorization - Visit Number 5    Authorization - Number of Visits 10    Progress Note Due on Visit 10    PT Start Time 9937    PT Stop Time 1696    PT Time Calculation (min) 39 min    Activity Tolerance Patient tolerated treatment well;No increased pain    Behavior During Therapy WFL for tasks assessed/performed             Past Medical History:  Diagnosis Date   Allergy    Anemia    Arthritis    knees - no meds   Asthma    Colon polyps    GERD (gastroesophageal reflux disease) 10/12/10   EGD, positive H. pylori   HSV infection    History   Hyperlipidemia    ? no meds - diet controlled   Post-operative nausea and vomiting    Seasonal allergies     Past Surgical History:  Procedure Laterality Date   24 HOUR [place] STUDY N/A 07/25/2019   Procedure: 24 HOUR [place] STUDY;  Surgeon: Lavena Bullion, DO;  Location: [facility];  Service: Gastroenterology;  Laterality: N/A;   ABDOMINAL HYSTERECTOMY     BALLOON DILATION N/A 07/24/2012   Procedure: BALLOON DILATION;  Surgeon: Inda Castle, MD;  Location: [facility];  Service: Endoscopy;  Laterality: N/A;   BRAVO [place] STUDY N/A 07/24/2012   Procedure: BRAVO [place] STUDY;  Surgeon: Inda Castle, MD;  Location: [facility];  Service: Endoscopy;  Laterality: N/A;   DILATION AND CURETTAGE OF UTERUS     SAB   ESOPHAGEAL MANOMETRY N/A 07/25/2019   Procedure: ESOPHAGEAL MANOMETRY (EM);  Surgeon:  Lavena Bullion, DO;  Location: [facility];  Service: Gastroenterology;  Laterality: N/A;   ESOPHAGOGASTRODUODENOSCOPY N/A 07/24/2012   Procedure: ESOPHAGOGASTRODUODENOSCOPY (EGD);  Surgeon: Inda Castle, MD;  Location: [facility];  Service: Endoscopy;  Laterality: N/A;   OVARIAN CYST REMOVAL  2004   laparotomy -left   ROBOTIC ASSISTED LAPAROSCOPIC LYSIS OF ADHESION N/A 03/21/2014   Procedure: ROBOTIC ASSISTED LAPAROSCOPIC EXTENSIVE LYSIS OF ADHESION (1 Hour);  Surgeon: Marvene Staff, MD;  Location: [facility];  Service: Gynecology;  Laterality: N/A;   ROBOTIC ASSISTED SALPINGO OOPHERECTOMY Left 03/21/2014   Procedure:  ROBOTIC ASSISTED LEFT OOPHORECTOMY;  Surgeon: Marvene Staff, MD;  Location: [facility];  Service: Gynecology;  Laterality: Left;   rotator cuff surgery  2017   SHOULDER ARTHROSCOPY Left    TENNIS ELBOW RELEASE/NIRSCHEL PROCEDURE Right 05/01/2015   Procedure: RIGHT ELBOW DEBRIDEMENT AND TENDON REPAIR;  Surgeon: [name], MD;  Location: [facility];  Service: Orthopedics;  Laterality: Right;   WISDOM TOOTH EXTRACTION      There were no vitals filed for this visit.   Subjective Assessment - 10/20/20 1641     Subjective Patient feels that her feet aren't hurting as much as  they were. She denies notable R foot pain recently. Patient reports compliance with her HEP. Patient reports 90% SANE score at this time. Patent reports she mainly wants to be able to return to her walking routine around neighborhood and surrounding area. Patient reports her work has been slower recently - she reports not being challenged by work due to decreased workload. Patient denies foot pain at arrival or through the day today.    Pertinent History Patient is a 55 year old female with primary complaint of plantar foot pain. Pt is s/p cortisone injection and prednisone taper. She states that symptoms returned after about 3 days. Patient reports pain in L foot more than R  foot. Patient reportsnotable pain with prolonged walking. Pt reports insidious onset without trauma. Patient reports that sypmtoms began as minor pinch in foot and got worrse. She reports pain beginning around [facility] 2022. Pt describes pain as shooting, sharp; she reports sometimes having aching pain in the evening. Patient reports that bracing does help, but "not a lot." Pain is near calcaneal tubercle region in plantar foot, L more affected than R. Patient denies numbness or paresthesia. Worse in the AM. 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 Patient is still working full-time. No night pain.    Limitations Lifting;House hold activities    Patient Stated Goals Patient wants to be pain-free, able to walk outside/regular walking program, gardening/flower bed    Pain Onset More than a month ago             OBJECTIVE FINDINGS    Observation: Mild pes planus, navicular drop below Feiss line in standing   Gait Mild overpronation. Symmetrical stance time and normal heel to toe pattern No excessive shoe wear noted on medial or lateral surfaces   Palpation TTP along L calcaneal tubercle;   Strength R/L 5/5 Hip flexion 5/5 Hip abduction 5/5 Hip adduction 5/5 Knee extension 5/5 Knee flexion 4+/4+ Ankle Plantarflexion 5/5 Ankle Dorsiflexion 5/5 Ankle Inversion 5/5 Ankle Eversion Full ROM for   AROM R/L 50/50 Ankle Plantarflexion 8/0 Ankle Dorsiflexion 35/35 Ankle Inversion 20/WNL Ankle Eversion 64/70 Great toe EXT  *Indicates Pain   PROM R/L 50/50* Ankle Plantarflexion 8/11 Ankle Dorsiflexion 35/35 Ankle Inversion 15/15 Ankle Eversion 75/76 Great toe extension *Indicates Pain   Passive Accessory Motion Talocrural Joint Distraction: Hypomobile Talocrural Joint AP: Hypomobile Talocrural Joint PA: WNL     Treatments Performed   Manual Therapy - for soft tissue mobility,  talocrural and 1st MTP joint mobility, symptom modulation (Performed bilaterally)   1st MTP dorsal mobilization gr III for improved extension mobility Posterior talocrural mobilization gr III-IV for improved ankle mobility   Manual dorsiflexion PROM with gentle overpressure at end-range    *not today* Gentle dorsiflexion stretching dynamically with manual overpressure on prone with combined knee flexion DTM and IASTM plantar arch and medial>lateral gastrocnemius.     Therapeutic Exercise - for great toe and ankle mobility, plantarflexor soft tissue mobility, intrinsic mm strengthening  Heel raise with great toe extension on towel roll, bilateral raise; 2x10 with slow eccentric   Patient education: HEP review and discussion of progression of graded loading for plantar fascia and graded exposure to walking regimen     [*not today*] Great toe stretch at wall, lunge; 1x60sec bilateral [verbal cueing for technique] Towel scrunch with 2-lb Dumbbell; 2x1 min bilateral  Tandem stance; 2x30 sec, bilat (step handrails for UE support as needed)  Short foot; 2x10, bilaterally [verbal/tactile cueing and demo for technique]        ASSESSMENT Patient has made fair progress toward goals with duration of work tolerated and home exercise program goals fully met. Patient has improved ROM with pt nearly attaining goal ROM passively, but she does still have moderate limitation in AROM for ankle and great toe. She reports minimal pain over the last 3 sessions, and she feels she is tolerating her work duties well. She has good strength per measurements obtained today and minimal tenderness to palpation along plantar aspect of heel and feet bilaterally. She is continuing with advanced HEP and 1x/week visits in PT. Patient presents with remaining deficits in hallux and ankle mobility, and plantarflexor soft tissue mobility. Patient will benefit from continued skilled therapeutic intervention to address the  above deficits and activity limitations in prolonged walking, prolonged standing, and pushing/lifting activities for best return to PLOF and improved ability to perform work-related duties. Will progress to advanced independent home program with end of her scheduled visit given her current progress.         PT Short Term Goals - 10/20/20 2247       PT SHORT TERM GOAL #1   Title Pt will be independent with HEP in order to decrease foot pain and improve mobility deficits and strength in order to improve pain-free function at home and work.    Baseline IE: HEP provided and reviewed; 10/19/20: compliant and able to recount home exercises    Time 3    Period Weeks    Status Achieved    Target Date 10/01/20      PT SHORT TERM GOAL #2   Title Patient will improve ankle dorsiflexion PROM to 10-15 deg or greater as needed for functional ankle mobility during gait.    Baseline Ankle dorsiflexion PROM R 8 deg, L 2 deg; 10/20/20: PROM Dorsiflexion R 8, L 11    Time 3    Period Weeks    Status Partially Met    Target Date 11/10/20               PT Long Term Goals - 10/20/20 1742       PT LONG TERM GOAL #1   Title Patient will tolerate half of her work shift prior to mid-day break without pain >2/10    Baseline Significant pain following weightbearing activity throughout work day. 10/20/20: tolerating work well without significant complaint    Time 6    Period Weeks    Status Achieved    Target Date 10/22/20      PT LONG TERM GOAL #2   Title Patient will demonstrate improved function as evidenced by a score of 61 on FOTO measure for full participation in activities at home and in the community.    Baseline FOTO score 6/8: 40. 10/20/20: 51    Time 6    Period Weeks    Status Partially Met    Target Date 11/10/20      PT LONG TERM GOAL #3   Title Patient willl perform push-sled with work-equivalent weight mimicking pushing tubs at work with proper body mechanics and no pain > 1-2/10  indicative of improved ability to perform high-level physical work tasks required    Baseline Pain with pushing rolling tubs at work    Time 6    Period Weeks    Status Deferred    Target Date 11/10/20      PT LONG TERM GOAL #4  Title Patient will have WFL ankle and digit AROM and PROM as needed for gait, obstacle/stair negotiation, transferring, and squatting    Baseline Limited ankle and great toe mobility at IE. 10/20/20: Good motion of bilat ankle and great toe passively with only mild R ankle DF mobility deficit; decreased R hallux extension and bilat dorsiflexion actively.    Time 6    Period Weeks    Status Partially Met    Target Date 11/10/20                   Plan - 10/20/20 2305     Clinical Impression Statement Patient has made fair progress toward goals with duration of work tolerated and home exercise program goals fully met. Patient has improved ROM with pt nearly attaining goal ROM passively, but she does still have moderate limitation in AROM for ankle and great toe. She reports minimal pain over the last 3 sessions, and she feels she is tolerating her work duties well. She has good strength per measurements obtained today and minimal tenderness to palpation along plantar aspect of heel and feet bilaterally. She is continuing with advanced HEP and 1x/week visits in PT. Patient presents with remaining deficits in hallux and ankle mobility, and plantarflexor soft tissue mobility. Patient will benefit from continued skilled therapeutic intervention to address the above deficits and activity limitations in prolonged walking, prolonged standing, and pushing/lifting activities for best return to PLOF and improved ability to perform work-related duties. Will progress to advanced independent home program with end of her scheduled visit given her current progress.    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 Motivation. Active lifestyle. Negative: bilateral LE involvement, laborous nature of work    PT Frequency 2x / week    PT Duration 6 weeks    PT Treatment/Interventions Electrical Stimulation;Therapeutic activities;Therapeutic exercise;Neuromuscular re-education;Manual techniques;Patient/family education;Passive range of motion;Dry needling    PT Next Visit Plan Manual therapy for improved ankle and hallux mobility, plantar soft tissue mobility and desensitization; intrinsic mm re-training and progression to graded loading as able. Transition to HEP at end of plan of care.    PT Home Exercise Plan Access Code [email or web address]    Consulted and Agree with Plan of Care Patient             Patient will benefit from skilled therapeutic intervention in order to improve the following deficits and impairments:  Abnormal gait, Decreased range of motion, Decreased strength, Hypomobility, Pain, Impaired flexibility, Difficulty walking  Visit Diagnosis: Pain in left foot  Pain in right foot  Difficulty in walking, not elsewhere classified     Problem List Patient Active Problem List   Diagnosis Date Noted   Hyperglycemia 07/23/2020   Sciatica 02/23/2020   Tinnitus 12/02/2019   Belching    Globus sensation    Hot flashes 07/01/2019   Loss of taste 02/05/2019   Headache 08/28/2018   Exposure to COVID-19 virus 08/24/2018   Chest pain 10/30/2017   Witnessed apneic spells 10/31/2016   Other neutropenia ([place]) 08/10/2016   Vertigo 07/28/2016   Health care maintenance 11/21/2014   History of colonic polyps 11/21/2014   Dysphagia  11/19/2014   Trochanteric  bursitis of left hip 10/08/2014   URI (upper respiratory infection) 02/10/2014   Pelvic pain in female 12/25/2013   Personal history of ovarian cyst 12/25/2013   Change in bowel movement 11/11/2013   breast tenderness 09/29/2013   Dysphagia, unspecified(787.20) 06/12/2012   Barrett's esophagus 06/12/2012   Environmental allergies 02/13/2012   Enlarged thyroid 02/13/2012   GERD (gastroesophageal reflux disease) 02/08/2012   Leukopenia 02/08/2012   Fatigue 02/08/2012   Hypercholesterolemia 02/08/2012   Valentina Gu, PT, DPT [id number] Eilleen Kempf 10/20/2020, 11:05 PM  [facility] [address] [place], [place], [address] Phone: [phone]   Fax:  [phone]  Name: Kelly Massey MRN: [id number] Date of Birth: 1965-07-05

## 2020-10-22 ENCOUNTER — Encounter: Payer: 59 | Admitting: Physical Therapy

## 2020-10-27 ENCOUNTER — Ambulatory Visit: Payer: 59 | Admitting: Physical Therapy

## 2020-10-27 ENCOUNTER — Other Ambulatory Visit: Payer: Self-pay

## 2020-10-27 DIAGNOSIS — R262 Difficulty in walking, not elsewhere classified: Secondary | ICD-10-CM

## 2020-10-27 DIAGNOSIS — M79671 Pain in right foot: Secondary | ICD-10-CM

## 2020-10-27 DIAGNOSIS — M79672 Pain in left foot: Secondary | ICD-10-CM | POA: Diagnosis not present

## 2020-10-27 NOTE — Therapy (Signed)
[facility] [address] [place], [place], [address] Phone: [phone]   Fax:  [phone]  Physical Therapy Treatment  Patient Details  Name: Kelly Massey MRN: [id number] Date of Birth: 07/09/1965 Referring Provider (PT): Criselda Peaches, DPM   Encounter Date: 10/27/2020   PT End of Session - 10/27/20 1709     Visit Number 6    Number of Visits 16    Date for PT Re-Evaluation 12/31/20    Authorization Time Period Cert 9/45-0/38    Authorization - Visit Number 6    Authorization - Number of Visits 10    Progress Note Due on Visit 10    PT Start Time 0435    PT Stop Time 0515    PT Time Calculation (min) 40 min    Activity Tolerance Patient tolerated treatment well;No increased pain    Behavior During Therapy WFL for tasks assessed/performed             Past Medical History:  Diagnosis Date   Allergy    Anemia    Arthritis    knees - no meds   Asthma    Colon polyps    GERD (gastroesophageal reflux disease) 10/12/10   EGD, positive H. pylori   HSV infection    History   Hyperlipidemia    ? no meds - diet controlled   Post-operative nausea and vomiting    Seasonal allergies     Past Surgical History:  Procedure Laterality Date   24 HOUR [place] STUDY N/A 07/25/2019   Procedure: 24 HOUR [place] STUDY;  Surgeon: Lavena Bullion, DO;  Location: [facility];  Service: Gastroenterology;  Laterality: N/A;   ABDOMINAL HYSTERECTOMY     BALLOON DILATION N/A 07/24/2012   Procedure: BALLOON DILATION;  Surgeon: Inda Castle, MD;  Location: [facility];  Service: Endoscopy;  Laterality: N/A;   BRAVO [place] STUDY N/A 07/24/2012   Procedure: BRAVO [place] STUDY;  Surgeon: Inda Castle, MD;  Location: [facility];  Service: Endoscopy;  Laterality: N/A;   DILATION AND CURETTAGE OF UTERUS     SAB   ESOPHAGEAL MANOMETRY N/A 07/25/2019   Procedure: ESOPHAGEAL MANOMETRY (EM);  Surgeon: Lavena Bullion, DO;  Location: [facility];   Service: Gastroenterology;  Laterality: N/A;   ESOPHAGOGASTRODUODENOSCOPY N/A 07/24/2012   Procedure: ESOPHAGOGASTRODUODENOSCOPY (EGD);  Surgeon: Inda Castle, MD;  Location: [facility];  Service: Endoscopy;  Laterality: N/A;   OVARIAN CYST REMOVAL  2004   laparotomy -left   ROBOTIC ASSISTED LAPAROSCOPIC LYSIS OF ADHESION N/A 03/21/2014   Procedure: ROBOTIC ASSISTED LAPAROSCOPIC EXTENSIVE LYSIS OF ADHESION (1 Hour);  Surgeon: Marvene Staff, MD;  Location: [facility];  Service: Gynecology;  Laterality: N/A;   ROBOTIC ASSISTED SALPINGO OOPHERECTOMY Left 03/21/2014   Procedure:  ROBOTIC ASSISTED LEFT OOPHORECTOMY;  Surgeon: Marvene Staff, MD;  Location: [facility];  Service: Gynecology;  Laterality: Left;   rotator cuff surgery  2017   SHOULDER ARTHROSCOPY Left    TENNIS ELBOW RELEASE/NIRSCHEL PROCEDURE Right 05/01/2015   Procedure: RIGHT ELBOW DEBRIDEMENT AND TENDON REPAIR;  Surgeon: [name], MD;  Location: [facility];  Service: Orthopedics;  Laterality: Right;   WISDOM TOOTH EXTRACTION      There were no vitals filed for this visit.   Subjective Assessment - 10/27/20 1636     Subjective Patient reports no plantar arch pain at arrival to PT. Patient reports pain in L posterior plantar surface of calcaneus  along region of bruising. Patient reports copmliance with HEP. She reports pain in area of bruising regardless of footwear.    Pertinent History Patient is a 55 year old female with primary complaint of plantar foot pain. Pt is s/p cortisone injection and prednisone taper. She states that symptoms returned after about 3 days. Patient reports pain in L foot more than R foot. Patient reportsnotable pain with prolonged walking. Pt reports insidious onset without trauma. Patient reports that sypmtoms began as minor pinch in foot and got worrse. She reports pain beginning around [facility] 2022. Pt describes pain as shooting, sharp; she reports sometimes having aching  pain in the evening. Patient reports that bracing does help, but "not a lot." Pain is near calcaneal tubercle region in plantar foot, L more affected than R. Patient denies numbness or paresthesia. Worse in the AM. 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 Patient is still working full-time. No night pain.    Limitations Lifting;House hold activities    Patient Stated Goals Patient wants to be pain-free, able to walk outside/regular walking program, gardening/flower bed    Currently in Pain? No/denies    Pain Onset More than a month ago               Treatments Performed   Manual Therapy - for soft tissue mobility, talocrural and 1st MTP joint mobility, symptom modulation (Performed bilaterally unless otherwise stated)    Posterior talocrural mobilization gr III-IV for improved ankle mobility   Manual dorsiflexion PROM with gentle overpressure at end-range STM along R calceaneal fat pad      *not today* 1st MTP dorsal mobilization gr III for improved extension mobility Gentle dorsiflexion stretching dynamically with manual overpressure on prone with combined knee flexion DTM and IASTM plantar arch and medial>lateral gastrocnemius.     Therapeutic Exercise - for great toe and ankle mobility, plantarflexor soft tissue mobility, intrinsic mm strengthening   Heel raise with great toe extension on towel roll, Double limb up, single-limb down; 1x10 with slow eccentric  Push sled; 50 lbs; 30 ft; 2x D/B   Patient education: HEP review and discussion of progression of graded loading for plantar fascia     [*not today*] Great toe stretch at wall, lunge; 1x60sec bilateral [verbal cueing for technique] Towel scrunch with 2-lb Dumbbell; 2x1 min bilateral Tandem stance; 2x30 sec, bilat (step handrails for UE support as needed) Short foot; 2x10, bilaterally [verbal/tactile cueing and demo for  technique]         ASSESSMENT Patient has made excellent progress with patient having minimal complaint of pain along medial longitudinal arch bilaterally. She does have moderate contusion along R fat pad region that is sensitive to palpation - no trauma per history from patient. Discussed simple OTC methods for offloading affected heel prn. Patient is following up with referring physician this Thursday and will discuss this fat pad contusion with him. She is able to meet long-term goal for simulated pushing task, indicative of improved ability to perform work duties. Patient is continuing mobility work with her HEP and continued graded loading for plantar fascia. Patient will benefit from continued skilled therapeutic intervention to address the above deficits and activity limitations in prolonged walking, prolonged standing, and pushing/lifting activities for best return to PLOF and improved ability to perform work-related duties. Will progress to advanced independent home program with end of her scheduled visit given her current progress.  PT Education - 10/27/20 1709     Education provided Yes    Education Details Discussed with patient use of callus cushions or mole skin to offload affected region of heel. Discussed following up with MD regarding contusion along heel.    Person(s) Educated Patient    Methods Explanation    Comprehension Verbalized understanding              PT Short Term Goals - 10/20/20 2247       PT SHORT TERM GOAL #1   Title Pt will be independent with HEP in order to decrease foot pain and improve mobility deficits and strength in order to improve pain-free function at home and work.    Baseline IE: HEP provided and reviewed; 10/19/20: compliant and able to recount home exercises    Time 3    Period Weeks    Status Achieved    Target Date 10/01/20      PT SHORT TERM GOAL #2   Title Patient will improve ankle dorsiflexion PROM to 10-15 deg or  greater as needed for functional ankle mobility during gait.    Baseline Ankle dorsiflexion PROM R 8 deg, L 2 deg; 10/20/20: PROM Dorsiflexion R 8, L 11    Time 3    Period Weeks    Status Partially Met    Target Date 11/10/20               PT Long Term Goals - 10/28/20 1623       PT LONG TERM GOAL #1   Title Patient will tolerate half of her work shift prior to mid-day break without pain >2/10    Baseline Significant pain following weightbearing activity throughout work day. 10/20/20: tolerating work well without significant complaint    Time 6    Period Weeks    Status Achieved    Target Date 10/22/20      PT LONG TERM GOAL #2   Title Patient will demonstrate improved function as evidenced by a score of 61 on FOTO measure for full participation in activities at home and in the community.    Baseline FOTO score 6/8: 40. 10/20/20: 51    Time 6    Period Weeks    Status Partially Met    Target Date 11/10/20      PT LONG TERM GOAL #3   Title Patient willl perform push-sled with work-equivalent weight mimicking pushing tubs at work with proper body mechanics and no pain > 1-2/10 indicative of improved ability to perform high-level physical work tasks required    Baseline 09/10/20: Pain with pushing rolling tubs at work; 10/27/20: performed without reproduction of plantar foot pain    Time 6    Period Weeks    Status Achieved    Target Date 11/10/20      PT LONG TERM GOAL #4   Title Patient will have WFL ankle and digit AROM and PROM as needed for gait, obstacle/stair negotiation, transferring, and squatting    Baseline Limited ankle and great toe mobility at IE. 10/20/20: Good motion of bilat ankle and great toe passively with only mild R ankle DF mobility deficit; decreased R hallux extension and bilat dorsiflexion actively.    Time 6    Period Weeks    Status Partially Met    Target Date 11/10/20                   Plan - 10/28/20 1628     Clinical  Impression  Statement Patient has made excellent progress with patient having minimal complaint of pain along medial longitudinal arch bilaterally. She does have moderate contusion along R fat pad region that is sensitive to palpation - no trauma per history from patient. Discussed simple OTC methods for offloading affected heel prn. Patient is following up with referring physician this Thursday and will discuss this fat pad contusion with him. She is able to meet long-term goal for simulated pushing task, indicative of improved ability to perform work duties. Patient is continuing mobility work with her HEP and continued graded loading for plantar fascia. Patient will benefit from continued skilled therapeutic intervention to address the above deficits and activity limitations in prolonged walking, prolonged standing, and pushing/lifting activities for best return to PLOF and improved ability to perform work-related duties. Will progress to advanced independent home program with end of her scheduled visit given her current progress.    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 Motivation. Active lifestyle. Negative: bilateral LE involvement, laborous nature of work    PT Frequency 2x / week    PT Duration 6 weeks    PT Treatment/Interventions Electrical Stimulation;Therapeutic activities;Therapeutic exercise;Neuromuscular re-education;Manual techniques;Patient/family education;Passive range of motion;Dry needling    PT Next Visit Plan Manual therapy for improved ankle and hallux mobility,  plantar soft tissue mobility and desensitization; intrinsic mm re-training and progression to graded loading as able. Transition to HEP at end of plan of care.    PT Home Exercise Plan Access Code [email or web address]    Consulted and Agree with Plan of Care Patient             Patient will benefit from skilled therapeutic intervention in order to improve the following deficits and impairments:  Abnormal gait, Decreased range of motion, Decreased strength, Hypomobility, Pain, Impaired flexibility, Difficulty walking  Visit Diagnosis: Pain in left foot  Pain in right foot  Difficulty in walking, not elsewhere classified     Problem List Patient Active Problem List   Diagnosis Date Noted   Hyperglycemia 07/23/2020   Sciatica 02/23/2020   Tinnitus 12/02/2019   Belching    Globus sensation    Hot flashes 07/01/2019   Loss of taste 02/05/2019   Headache 08/28/2018   Exposure to COVID-19 virus 08/24/2018   Chest pain 10/30/2017   Witnessed apneic spells 10/31/2016   Other neutropenia ([place]) 08/10/2016   Vertigo 07/28/2016   Health care maintenance 11/21/2014   History of colonic polyps 11/21/2014   Dysphagia 11/19/2014   Trochanteric bursitis of left hip 10/08/2014   URI (upper respiratory infection) 02/10/2014   Pelvic pain in female 12/25/2013   Personal history of ovarian cyst 12/25/2013   Change in bowel movement 11/11/2013   breast tenderness 09/29/2013   Dysphagia, unspecified(787.20) 06/12/2012   Barrett's esophagus 06/12/2012   Environmental allergies 02/13/2012   Enlarged thyroid 02/13/2012   GERD (gastroesophageal reflux disease) 02/08/2012   Leukopenia 02/08/2012   Fatigue 02/08/2012   Hypercholesterolemia 02/08/2012   Valentina Gu, PT, DPT #[id number]  Kelly Massey 10/28/2020, 4:29 PM  [facility] [address] [place], [place], [address] Phone: [phone]   Fax:  [phone]  Name:  Kelly Massey MRN:  [id number] Date of Birth: 1965-04-06

## 2020-10-28 ENCOUNTER — Encounter: Payer: Self-pay | Admitting: Physical Therapy

## 2020-10-29 ENCOUNTER — Encounter: Payer: 59 | Admitting: Physical Therapy

## 2020-10-30 ENCOUNTER — Ambulatory Visit: Payer: 59 | Admitting: Podiatry

## 2020-10-30 ENCOUNTER — Encounter: Payer: Self-pay | Admitting: Podiatry

## 2020-10-30 ENCOUNTER — Other Ambulatory Visit: Payer: Self-pay

## 2020-10-30 DIAGNOSIS — M722 Plantar fascial fibromatosis: Secondary | ICD-10-CM | POA: Diagnosis not present

## 2020-10-30 DIAGNOSIS — L89626 Pressure-induced deep tissue damage of left heel: Secondary | ICD-10-CM | POA: Diagnosis not present

## 2020-10-30 NOTE — Patient Instructions (Signed)
Tuli's heel cups can be bought on [employer]

## 2020-11-02 ENCOUNTER — Encounter: Payer: Self-pay | Admitting: Podiatry

## 2020-11-02 NOTE — Progress Notes (Signed)
  Subjective:  Patient ID: Kelly Massey, female    DOB: [birth year],  MRN: [id number]  Chief Complaint  Patient presents with   Plantar Fasciitis      recheck plantar fasciitis left    55 y.o. female returns with the above complaint. History confirmed with patient.  Starting to improve PT is helping.  She has a new place on the plantar left heel that is painful for her  Objective:  Physical Exam: warm, good capillary refill, no trophic changes or ulcerative lesions, normal DP and PT pulses and normal sensory exam. Left Foot: point tenderness over the heel pad there is a soft ecchymotic area on the plantar left heel Right Foot: point tenderness over the heel pad, less intense than left     Radiographs: X-ray of both feet: no fracture, dislocation, swelling or degenerative changes noted, she has a plantar cutaneous spur on the left side a Assessment:   1. Plantar fasciitis   2. Pressure injury of deep tissue of left heel      Plan:  Patient was evaluated and treated and all questions answered.  Discussed the etiology and treatment options for plantar fasciitis including stretching, formal physical therapy, supportive shoegears such as a running shoe or sneaker, pre fabricated orthoses, injection therapy, and oral medications. We also discussed the role of surgical treatment of this for patients who do not improve after exhausting non-surgical treatment options.   Plantar Fasciitis  -Continue physical therapy  Is a new issue of what seems to be similar to a pressure injury on the left plantar heel.  Discussed offloading the area.  We will continue to monitor.  I took photographs of this.  Consider advanced imaging if not improving Return in about 1 month (around 11/30/2020) for re-check heel .

## 2020-11-10 ENCOUNTER — Ambulatory Visit: Payer: 59 | Admitting: Physical Therapy

## 2020-11-11 ENCOUNTER — Encounter: Payer: 59 | Admitting: Physical Therapy

## 2020-11-28 ENCOUNTER — Other Ambulatory Visit: Payer: Self-pay | Admitting: Gastroenterology

## 2020-11-28 ENCOUNTER — Other Ambulatory Visit: Payer: Self-pay | Admitting: Internal Medicine

## 2020-12-04 ENCOUNTER — Other Ambulatory Visit: Payer: Self-pay

## 2020-12-04 ENCOUNTER — Ambulatory Visit (INDEPENDENT_AMBULATORY_CARE_PROVIDER_SITE_OTHER): Payer: 59

## 2020-12-04 ENCOUNTER — Ambulatory Visit: Payer: 59 | Admitting: Podiatry

## 2020-12-04 DIAGNOSIS — R2242 Localized swelling, mass and lump, left lower limb: Secondary | ICD-10-CM

## 2020-12-09 NOTE — Progress Notes (Signed)
  Subjective:  Patient ID: GEANIE GLASBY, female    DOB: 1965-10-29,  MRN: [id number]  Chief Complaint  Patient presents with   Plantar Fasciitis    Follow up left heel    55 y.o. female returns with the above complaint. History confirmed with patient.  PT has been helpful her heel pain is nearly fully resolved the place on the heel is still painful and bothering her  Objective:  Physical Exam: warm, good capillary refill, no trophic changes or ulcerative lesions, normal DP and PT pulses and normal sensory exam. Left Foot: point tenderness over the heel pad there is a soft ecchymotic area on the plantar left heel Right Foot: point tenderness over the heel pad, less intense than left     Radiographs: X-ray of both feet: no fracture, dislocation, swelling or degenerative changes noted, she has a plantar cutaneous spur on the left side a, x-rays show no foreign body or plantar wound where the mass is Assessment:   1. Subcutaneous mass of left lower extremity      Plan:  Patient was evaluated and treated and all questions answered.  Her plantar fasciitis has improved and has significantly nearly resolved   The painful area of pressure on the back of the heel is still bothering her.  Her x-ray is negative for any changes.  I am ordering an MRI because there is a fluctuant area that feels ago soft tissue mass that we should evaluate.  Could be vascular malformation.  May need this for surgical planning for excision if indicated   No follow-ups on file.

## 2020-12-15 ENCOUNTER — Ambulatory Visit: Payer: Self-pay

## 2020-12-18 ENCOUNTER — Ambulatory Visit (AMBULATORY_SURGERY_CENTER): Payer: 59 | Admitting: *Deleted

## 2020-12-18 ENCOUNTER — Other Ambulatory Visit: Payer: Self-pay

## 2020-12-18 VITALS — Ht 66.0 in | Wt 188.0 lb

## 2020-12-18 DIAGNOSIS — Z1211 Encounter for screening for malignant neoplasm of colon: Secondary | ICD-10-CM

## 2020-12-18 NOTE — Progress Notes (Signed)
Pt verified name, DOB, address and insurance during PV today.  Pt mailed instruction packet of Emmi video, copy of consent form to read and not return, and instruction  PV completed over the phone.  Pt encouraged to call with questions or issues.  My Chart instructions to pt as well    No egg or soy allergy known to patient  issues known to pt with past sedation with any surgeries or procedures- PONV  Patient denies ever being told they had issues or difficulty with intubation  No FH of Malignant Hyperthermia Pt is not on diet pills Pt is not on  home 02  Pt is not on blood thinners  Pt denies issues with constipation  No A fib or A flutter  EMMI video to pt or via [address] guidelines implemented in PV today with Pt and RN   Pt is fully vaccinated  for Covid   Due to the COVID-19 pandemic we are asking patients to follow certain guidelines.  Pt aware of COVID protocols and [facility] guidelines

## 2020-12-24 ENCOUNTER — Encounter: Payer: Self-pay | Admitting: Gastroenterology

## 2020-12-31 ENCOUNTER — Other Ambulatory Visit: Payer: Self-pay

## 2020-12-31 ENCOUNTER — Encounter: Payer: Self-pay | Admitting: Internal Medicine

## 2020-12-31 ENCOUNTER — Other Ambulatory Visit (INDEPENDENT_AMBULATORY_CARE_PROVIDER_SITE_OTHER): Payer: 59

## 2020-12-31 ENCOUNTER — Other Ambulatory Visit: Payer: 59

## 2020-12-31 DIAGNOSIS — E7800 Pure hypercholesterolemia, unspecified: Secondary | ICD-10-CM | POA: Diagnosis not present

## 2020-12-31 DIAGNOSIS — D72819 Decreased white blood cell count, unspecified: Secondary | ICD-10-CM | POA: Diagnosis not present

## 2020-12-31 DIAGNOSIS — R739 Hyperglycemia, unspecified: Secondary | ICD-10-CM

## 2020-12-31 LAB — CBC WITH DIFFERENTIAL/PLATELET
Basophils Absolute: 0 10*3/uL (ref 0.0–0.1)
Basophils Relative: 1.1 % (ref 0.0–3.0)
Eosinophils Absolute: 0 10*3/uL (ref 0.0–0.7)
Eosinophils Relative: 1 % (ref 0.0–5.0)
HCT: 42.5 % (ref 36.0–46.0)
Hemoglobin: 14.3 g/dL (ref 12.0–15.0)
Lymphocytes Relative: 46.7 % — ABNORMAL HIGH (ref 12.0–46.0)
Lymphs Abs: 1.1 10*3/uL (ref 0.7–4.0)
MCHC: 33.5 g/dL (ref 30.0–36.0)
MCV: 90.9 fl (ref 78.0–100.0)
Monocytes Absolute: 0.3 10*3/uL (ref 0.1–1.0)
Monocytes Relative: 13 % — ABNORMAL HIGH (ref 3.0–12.0)
Neutro Abs: 0.9 10*3/uL — ABNORMAL LOW (ref 1.4–7.7)
Neutrophils Relative %: 38.2 % — ABNORMAL LOW (ref 43.0–77.0)
Platelets: 186 10*3/uL (ref 150.0–400.0)
RBC: 4.68 Mil/uL (ref 3.87–5.11)
RDW: 14.6 % (ref 11.5–15.5)
WBC: 2.4 10*3/uL — ABNORMAL LOW (ref 4.0–10.5)

## 2020-12-31 LAB — BASIC METABOLIC PANEL
BUN: 7 mg/dL (ref 6–23)
CO2: 31 mEq/L (ref 19–32)
Calcium: 9.6 mg/dL (ref 8.4–10.5)
Chloride: 103 mEq/L (ref 96–112)
Creatinine, Ser: 0.83 mg/dL (ref 0.40–1.20)
GFR: 79.3 mL/min (ref >60.00)
Glucose, Bld: 107 mg/dL — ABNORMAL HIGH (ref 70–99)
Potassium: 4.9 mEq/L (ref 3.5–5.1)
Sodium: 140 mEq/L (ref 135–145)

## 2020-12-31 LAB — LIPID PANEL
Cholesterol: 245 mg/dL — ABNORMAL HIGH (ref 0–200)
HDL: 79.8 mg/dL (ref >39.00)
LDL Cholesterol: 147 mg/dL — ABNORMAL HIGH (ref 0–99)
NonHDL: 165.44
Total CHOL/HDL Ratio: 3
Triglycerides: 92 mg/dL (ref 0.0–149.0)
VLDL: 18.4 mg/dL (ref 0.0–40.0)

## 2020-12-31 LAB — HEPATIC FUNCTION PANEL
ALT: 11 U/L (ref 0–35)
AST: 13 U/L (ref 0–37)
Albumin: 4.3 g/dL (ref 3.5–5.2)
Alkaline Phosphatase: 64 U/L (ref 39–117)
Bilirubin, Direct: 0.1 mg/dL (ref 0.0–0.3)
Total Bilirubin: 0.7 mg/dL (ref 0.2–1.2)
Total Protein: 6.9 g/dL (ref 6.0–8.3)

## 2020-12-31 LAB — HEMOGLOBIN A1C: Hgb A1c MFr Bld: 6.3 % (ref 4.6–6.5)

## 2021-01-01 ENCOUNTER — Encounter: Payer: 59 | Admitting: Gastroenterology

## 2021-01-01 ENCOUNTER — Ambulatory Visit: Payer: 59 | Attending: Podiatry | Admitting: Physical Therapy

## 2021-01-01 ENCOUNTER — Encounter: Payer: Self-pay | Admitting: Physical Therapy

## 2021-01-01 DIAGNOSIS — M6281 Muscle weakness (generalized): Secondary | ICD-10-CM | POA: Diagnosis present

## 2021-01-01 DIAGNOSIS — M545 Low back pain, unspecified: Secondary | ICD-10-CM | POA: Insufficient documentation

## 2021-01-01 NOTE — Therapy (Addendum)
[facility] [address] [place], [place], [address] Phone: [phone]   Fax:  [phone]  Physical Therapy Evaluation  Patient Details  Name: Kelly Massey MRN: [id number] Date of Birth: 16-Jan-1966 Referring Provider (PT): Levy Pupa, [place]   Encounter Date: 01/01/2021   PT End of Session - 01/02/21 0912     Visit Number 1    Number of Visits 13    Date for PT Re-Evaluation 02/12/21    Authorization Type [employer] 60 combined PT/OT/Speech visits per year - 6 used prior to this episode of care    Progress Note Due on Visit 10    PT Start Time 1646    PT Stop Time 1735    PT Time Calculation (min) 49 min    Activity Tolerance Patient tolerated treatment well;No increased pain    Behavior During Therapy [facility] for tasks assessed/performed             Past Medical History:  Diagnosis Date   Allergy    Anemia    Arthritis    knees - no meds   Colon polyps    HPP_ 2012   GERD (gastroesophageal reflux disease) 10/12/2010   EGD, positive H. pylori   HSV infection    History   Hyperlipidemia    ? no meds - diet controlled   Post-operative nausea and vomiting    Seasonal allergies     Past Surgical History:  Procedure Laterality Date   45 HOUR [place] STUDY N/A 07/25/2019   Procedure: 24 HOUR [place] STUDY;  Surgeon: Lavena Bullion, DO;  Location: [facility];  Service: Gastroenterology;  Laterality: N/A;   ABDOMINAL HYSTERECTOMY     BALLOON DILATION N/A 07/24/2012   Procedure: BALLOON DILATION;  Surgeon: Inda Castle, MD;  Location: [facility];  Service: Endoscopy;  Laterality: N/A;   BRAVO [place] STUDY N/A 07/24/2012   Procedure: BRAVO [place] STUDY;  Surgeon: Inda Castle, MD;  Location: [facility];  Service: Endoscopy;  Laterality: N/A;   COLONOSCOPY     DILATION AND CURETTAGE OF UTERUS     SAB   ESOPHAGEAL MANOMETRY N/A 07/25/2019   Procedure: ESOPHAGEAL MANOMETRY (EM);  Surgeon: Lavena Bullion, DO;  Location: [facility];  Service: Gastroenterology;  Laterality: N/A;   ESOPHAGOGASTRODUODENOSCOPY N/A 07/24/2012   Procedure: ESOPHAGOGASTRODUODENOSCOPY (EGD);  Surgeon: Inda Castle, MD;  Location: [facility];  Service: Endoscopy;  Laterality: N/A;   OVARIAN CYST REMOVAL  2004   laparotomy -left   ROBOTIC ASSISTED LAPAROSCOPIC LYSIS OF ADHESION N/A 03/21/2014   Procedure: ROBOTIC ASSISTED LAPAROSCOPIC EXTENSIVE LYSIS OF ADHESION (1 Hour);  Surgeon: Marvene Staff, MD;  Location: [facility];  Service: Gynecology;  Laterality: N/A;   ROBOTIC ASSISTED SALPINGO OOPHERECTOMY Left 03/21/2014   Procedure:  ROBOTIC ASSISTED LEFT OOPHORECTOMY;  Surgeon: Marvene Staff, MD;  Location: [facility];  Service: Gynecology;  Laterality: Left;   rotator cuff surgery  2017   SHOULDER ARTHROSCOPY Left    TENNIS ELBOW RELEASE/NIRSCHEL PROCEDURE Right 05/01/2015   Procedure: RIGHT ELBOW DEBRIDEMENT AND TENDON REPAIR;  Surgeon: [name], MD;  Location: [facility];  Service: Orthopedics;  Laterality: Right;   UPPER GASTROINTESTINAL ENDOSCOPY     WISDOM TOOTH EXTRACTION      There were no vitals filed for this visit.    Subjective Assessment - 01/02/21 1002     Subjective 55 year old female with primary c/o low back pain    Pertinent  History Patient is a 54 year old female with primary complaint of low back pain. Patient reports she was given prednisone taper and was given muscle relaxers. She felt that mm relaxers did not help. Patient reports her pain began insidiously with no apparent injury/incident. Pt reports pain starting around L low back and down to L gluteal region; she has had some pain across and affecting R paraspinal too. Pt reports using salonpas patch along the area and this helped significantly. Patient reports having some "pinching" or sensation of something "sticking in" the back. Pt denies paresthesias down lower extremities, though she has experienced this before. No disturbed  sleep recently with use of Salonpas. (-) Red flag screen    Limitations Sitting;Lifting;Other (comment)   comfort with lying/sleeping   Diagnostic tests Radiographs taken prior to this recurrence of pain - degenerative changes in L-spine    Patient Stated Goals Able to get back into 5 mi/day walking regimen; decreased pain    Currently in Pain? Yes    Pain Score 4     Pain Location Back    Pain Orientation Lower    Pain Onset More than a month ago    Aggravating Factors  prolonged sitting, bending, lifting, sidelying on either side (during flare-up)    Pain Relieving Factors massage (uses massage chair), Salonpas                [facility] PT Assessment - 01/02/21 0001       Assessment   Medical Diagnosis Low back pain, unspecified    Referring Provider (PT) Levy Pupa, PA    Onset Date/Surgical Date 10/17/20    Next MD Visit None at this time; patient to f/u prn    Prior Therapy None for present condition      Balance Screen   Has the patient fallen in the past 6 months No      Prior Function   Level of Independence Independent      Cognition   Overall Cognitive Status Within Functional Limits for tasks assessed              SUBJECTIVE Chief complaint: 55 year old female with primary c/o low back pain  History: Patient is a 55 year old female with primary complaint of low back pain. Patient reports she was given prednisone taper and was given muscle relaxers. She felt that mm relaxers did not help. Patient reports her pain began insidiously with no apparent injury/incident. Pt reports pain starting around L low back and down to L gluteal region; she has had some pain across and affecting R paraspinal too. Pt reports using salonpas patch along the area and this helped significantly. Patient reports having some "pinching" or sensation of something "sticking in" the back. Pt denies paresthesias down lower extremities, though she has experienced this before. No disturbed  sleep recently with use of Salonpas.  Referring Dx: lumbar DDD, low back pain Referring Provider: Levy Pupa, PA Pain location: see above Pain: Present 3-4/10, Best 3/10, Worst 10/10: Pain quality: aching presently; intermittently pinching, something "sticking in" back Radiating pain: No  Numbness/Tingling: No 24 hour pain behavior: worse in evening, pain also in early AM Aggravating factors: prolonged sitting, bending, lifting, sidelying on either side (during flare-up) Easing factors: massage (uses massage chair), Salonpas History of back injury, pain, surgery, or therapy: Yes, one episode of sciatica last year Follow-up appointment with MD: No, f/u with referring provider prn   Imaging: Yes ; degenerative changes per past radiographs Falls in the last 6  months: No  Occupational demands: pushing/pulling with hand trucks, lifting Hobbies: walking regimen, yard work, working in [employer] Goals: Able to get back into 5 mi/day walking regimen Red flags (bowel/bladder changes, saddle paresthesia, personal history of cancer, chills/fever, night sweats, unrelenting pain, first onset of insidious LBP <20 y/o) Negative   (Night sweats with hot flashes)    OBJECTIVE  Mental Status Patient is oriented to person, place and time.  Recent memory is intact.  Remote memory is intact.  Attention span and concentration are intact.  Expressive speech is intact.  Patient's fund of knowledge is within normal limits for educational level.  SENSATION: Grossly intact to light touch bilateral LEs as determined by testing dermatomes L2-S2 Proprioception and hot/cold testing deferred on this date   Upper Motor Neuron Screen: Clonus: R Negative, L Negative Hoffman: R Negative, L Negative   MUSCULOSKELETAL: Tremor: None Bulk: Normal Tone: Normal No visible step-off along spinal column  Posture Lumbar lordosis: Mild increase Good self-selected sitting posture Lumbar lateral shift:  negative Lower crossed syndrome (tight hip flexors and erector spinae; weak gluts and abs): negative  Gait Unremarkable   Palpation Tenderness to palpation along bilateral PSIS, gluteus medius   Strength (out of 5) R/L 4+*/4+* Hip flexion (Pain R lumbar paraspinal)  4/5 Hip ER 4/4+* Hip IR (Pain L lateral hip)  5/5 Hip adduction Not tested/not tested Hip extension 5/5 Knee extension 5/5 Knee flexion 5/5 Ankle dorsiflexion 5/5 Ankle plantarflexion *Indicates pain   AROM (degrees) R/L (all movements include overpressure unless otherwise stated) Lumbar forward flexion (65): 75%* (catch with return to neutral) Lumbar extension (30): 50% Lumbar lateral flexion (25): R: 100%*  L: 100% Thoracic and Lumbar rotation (30 degrees):  R: 75% L: 75% Hip IR (0-45): R: WNL L: WNL Hip ER (0-45): R: WNL L: WNL Hip Flexion (0-125): R: WNL L: WNL *Indicates pain    Repeated Movements No pain reported in lying following repeated extension in lying, though pt does have fleeting pain following rolling/bed mobility on table after REIL.     Muscle Length Hamstrings: R: 15 degrees L: 15 degrees  [place]: R 75%, L 75% Thomas: not tested   Passive Accessory Intervertebral Motion (PAIVM) Pt denies reproduction of back pain with CPA L3-L5; reproduction of pain with CPA T10-L2 Hypomobile throughout T3-T10 and at lumbosacral junction.     SPECIAL TESTS Lumbar Radiculopathy and Discogenic: Centralization and Peripheralization (SN 92, -LR 0.12): Positive for reduced flank pain following repeated extension, though pain re-occurs with bed/mat mobility  Slump (SN 83, -LR 0.32): R: Negative L: Negative SLR (SN 92, -LR 0.29): R: Negative L:  Negative Crossed SLR (SP 90): R: Negative L: Negative  Facet Joint: Extension-Rotation (SN 100, -LR 0.0): R: Negative L: Negative  Lumbar Spinal Stenosis: Lumbar quadrant (SN 70): R: Negative L: Negative  SIJ:  SI Thrust: Positive    PT Education -  01/02/21 1027     Education provided Yes    Education Details Patient education on current condition, anatomy involved, prognosis, plan of care. Discussion on activity modification to prevent flare-up of condition, including temporary avoidance of repeated flexion postures or postures simulating slump position.    Person(s) Educated Patient    Methods Explanation;Demonstration    Comprehension Verbalized understanding;Returned demonstration              ASSESSMENT Clinical Impression: Pt is a pleasant 55 year old female referred for low back pain with no signs of classic radiculopathy at this time, though she does have  history of L-sided sciatic-type symptoms and paresthesias that were previously treated successfully. Symptoms are transiently diminished with extension in lying. Negative testing and subjective exam indicators for stenosis or facet joint-mediated pain. PT examination reveals deficits in bed mobility/rolling, bending, sustained sitting, lifting. Pt presents with deficits in hip strength, thoracic spine mobility, sagittal and transverse plane range of motion, and intermittent pain with variable laterality. Pt will benefit from skilled PT services to address deficits and return to pain-free function at home and work.     PT Short Term Goals - 01/02/21 1021       PT SHORT TERM GOAL #1   Title Pt will be independent and 100% compliant with established HEP and activity modification as needed to augment PT intervention and prevent flare-up of back pain as needed for best return to prior level of function.    Baseline 01/01/21: HEP initiated, activity modification discussed    Time 3    Period Weeks    Status New    Target Date 01/22/21      PT SHORT TERM GOAL #2   Title Patient will have full thoracolumbar AROM without reproduction of pain as needed for functional reaching, self-care ADLs, bending, household chores.    Baseline 01/01/21: Pain and motion loss with flexion, motion  loss c extension, pain with R lateral flexion, motion loss c bilat thoracolumbar rotation.    Time 4    Period Weeks    Status New    Target Date 01/29/21               PT Long Term Goals - 01/01/21 1747       PT LONG TERM GOAL #1   Title Patient will demonstrate improved function as evidenced by reaching goal FOTO score, indicative of full participation in activities at home and in the community.    Baseline 01/01/21: FOTO to be obtained next visit    Time 6    Period Weeks    Target Date 02/12/21      PT LONG TERM GOAL #2   Title Pt will decrease worst back pain as reported on NPRS by at least 2 points in order to demonstrate clinically significant reduction in back pain.    Baseline 01/01/21: Pain 10/10 at worst    Time 6    Period Weeks    Status New    Target Date 02/12/21      PT LONG TERM GOAL #3   Title Patient will demonstrate box lift from floor to waist and box transfer from surface to surface simulating work duties with load usually managed in working environment with sound body mechanics and no reproduction of back pain    Baseline 02/12/21: Patient significantly limited in performance of lifting at this time    Time 6    Period Weeks    Status New    Target Date 02/12/21                    Plan - 01/02/21 1017     Clinical Impression Statement Pt is a pleasant 55 year old female referred for low back pain with no signs of classic radiculopathy at this time, though she does have history of L-sided sciatic-type symptoms and paresthesias that were previously treated successfully. Symptoms are transiently diminished with extension in lying. Negative testing and subjective exam indicators for stenosis or facet joint-mediated pain. PT examination reveals deficits in bed mobility/rolling, bending, sustained sitting, lifting. Pt presents with deficits in hip strength,  thoracic spine mobility, sagittal and transverse plane range of motion, and intermittent pain  with variable laterality. Pt will benefit from skilled PT services to address deficits and return to pain-free function at home and work.    Personal Factors and Comorbidities Comorbidity 3+;Profession    Comorbidities GERD, Barret's esophagus, high cholesterol, leukopenia    Examination-Activity Limitations Sit;Lift;Squat;Sleep;Carry;Bend;Bed Mobility    Examination-Participation Restrictions Community Activity;Yard Work;Occupation    Stability/Clinical Decision Making Evolving/Moderate complexity    Clinical Decision Making Moderate    Rehab Potential Good    Clinical Impairments Affecting Rehab Potential Positive: Age. Motivation. Active lifestyle. Negative: recurrent episode of low back pain    PT Frequency 2x / week    PT Duration 6 weeks    PT Treatment/Interventions Electrical Stimulation;Therapeutic activities;Therapeutic exercise;Neuromuscular re-education;Manual techniques;Patient/family education;Dry needling;Spinal Manipulations    PT Next Visit Plan MDT extension principle with modification or force progression prn, manual therapy to address mobility deficits or soft tissue sensitivity, graded movement and progressive strengthening as able    PT Home Exercise Plan Access Code [email or web address]    Consulted and Agree with Plan of Care Patient             Patient will benefit from skilled therapeutic intervention in order to improve the following deficits and impairments:  Decreased range of motion, Decreased strength, Pain, Impaired flexibility  Visit Diagnosis: Bilateral low back pain without sciatica, unspecified chronicity  Muscle weakness (generalized)     Problem List Patient Active Problem List   Diagnosis Date Noted   Hyperglycemia 07/23/2020   Sciatica 02/23/2020   Tinnitus 12/02/2019   Belching    Globus sensation    Hot flashes 07/01/2019   Loss of taste 02/05/2019   Headache 08/28/2018   Exposure to COVID-19 virus 08/24/2018   Chest pain 10/30/2017    Witnessed apneic spells 10/31/2016   Other neutropenia ([place]) 08/10/2016   Vertigo 07/28/2016   Health care maintenance 11/21/2014   History of colonic polyps 11/21/2014   Dysphagia 11/19/2014   Trochanteric bursitis of left hip 10/08/2014   URI (upper respiratory infection) 02/10/2014   Pelvic pain in female 12/25/2013   Personal history of ovarian cyst 12/25/2013   Change in bowel movement 11/11/2013   breast tenderness 09/29/2013   Dysphagia, unspecified(787.20) 06/12/2012   Barrett's esophagus 06/12/2012   Environmental allergies 02/13/2012   Enlarged thyroid 02/13/2012   GERD (gastroesophageal reflux disease) 02/08/2012   Leukopenia 02/08/2012   Fatigue 02/08/2012   Hypercholesterolemia 02/08/2012   *Addendum for sending PT certification only* Valentina Gu, PT, DPT #[id number]  Eilleen Kempf, PT 01/02/2021, 10:38 AM  [facility] [address]. [place], [place], [address] Phone: [phone]   Fax:  [phone]  Name: STORY CONTI MRN: [id number] Date of Birth: Oct 07, 1965

## 2021-01-02 ENCOUNTER — Telehealth (INDEPENDENT_AMBULATORY_CARE_PROVIDER_SITE_OTHER): Payer: 59 | Admitting: Internal Medicine

## 2021-01-02 DIAGNOSIS — E7800 Pure hypercholesterolemia, unspecified: Secondary | ICD-10-CM

## 2021-01-02 DIAGNOSIS — R739 Hyperglycemia, unspecified: Secondary | ICD-10-CM

## 2021-01-02 DIAGNOSIS — D72819 Decreased white blood cell count, unspecified: Secondary | ICD-10-CM

## 2021-01-02 DIAGNOSIS — D708 Other neutropenia: Secondary | ICD-10-CM

## 2021-01-02 DIAGNOSIS — K2100 Gastro-esophageal reflux disease with esophagitis, without bleeding: Secondary | ICD-10-CM | POA: Diagnosis not present

## 2021-01-02 DIAGNOSIS — Z9109 Other allergy status, other than to drugs and biological substances: Secondary | ICD-10-CM

## 2021-01-02 DIAGNOSIS — K2270 Barrett's esophagus without dysplasia: Secondary | ICD-10-CM

## 2021-01-02 DIAGNOSIS — M722 Plantar fascial fibromatosis: Secondary | ICD-10-CM

## 2021-01-02 NOTE — Addendum Note (Signed)
Addended by: Valentina Gu T on: 01/02/2021 10:46 AM   Modules accepted: Orders

## 2021-01-02 NOTE — Progress Notes (Signed)
Patient ID: Kelly Massey, female   DOB: 1965-05-02, 55 y.o.   MRN: [id number]   Virtual Visit via telephone Note  This visit type was conducted due to national recommendations for restrictions regarding the COVID-19 pandemic (e.g. social distancing).  This format is felt to be most appropriate for this patient at this time.  All issues noted in this document were discussed and addressed.  No physical exam was performed (except for noted visual exam findings with Video Visits).   I connected with Pandora Leiter by telephone and verified that I am speaking with the correct person using two identifiers. Location patient: home Location provider: home office Persons participating in the virtual visit: patient, provider  The limitations, risks, security and privacy concerns of performing an evaluation and management service by telephone and the availability of in person appointments have been discussed.  It has also been discussed with the patient that there may be a patient responsible charge related to this service. The patient expressed understanding and agreed to proceed.  Interactive audio and video telecommunications were attempted between this provider and patient, however failed. We continued and completed visit with audio only.   Reason for visit: follow up appt  HPI: Follow up regarding acid reflux and leukopenia.  Has been seeing podiatry for plantar fasciitis.  PT - finished.  She has been having issues with lower back.  Seeing [facility] - prednisone/muscle relaxer.  Discussed continued f/u with ortho.  Tries to stay active.  No chest pain or sob reported.  Eating.  No nausea or vomiting.  Bowels moving.  Discussed recent labs.  Has had low wbc count.  Has been worked up by hematology previously.  Discussed cholesterol and diet and exercise.     ROS: See pertinent positives and negatives per HPI.  Past Medical History:  Diagnosis Date   Allergy    Anemia    Arthritis    knees - no  meds   Colon polyps    HPP_ 2012   GERD (gastroesophageal reflux disease) 10/12/2010   EGD, positive H. pylori   HSV infection    History   Hyperlipidemia    ? no meds - diet controlled   Post-operative nausea and vomiting    Seasonal allergies     Past Surgical History:  Procedure Laterality Date   66 HOUR [place] STUDY N/A 07/25/2019   Procedure: 24 HOUR [place] STUDY;  Surgeon: Lavena Bullion, DO;  Location: [facility];  Service: Gastroenterology;  Laterality: N/A;   ABDOMINAL HYSTERECTOMY     BALLOON DILATION N/A 07/24/2012   Procedure: BALLOON DILATION;  Surgeon: Inda Castle, MD;  Location: [facility];  Service: Endoscopy;  Laterality: N/A;   BRAVO [place] STUDY N/A 07/24/2012   Procedure: BRAVO [place] STUDY;  Surgeon: Inda Castle, MD;  Location: [facility];  Service: Endoscopy;  Laterality: N/A;   COLONOSCOPY     DILATION AND CURETTAGE OF UTERUS     SAB   ESOPHAGEAL MANOMETRY N/A 07/25/2019   Procedure: ESOPHAGEAL MANOMETRY (EM);  Surgeon: Lavena Bullion, DO;  Location: [facility];  Service: Gastroenterology;  Laterality: N/A;   ESOPHAGOGASTRODUODENOSCOPY N/A 07/24/2012   Procedure: ESOPHAGOGASTRODUODENOSCOPY (EGD);  Surgeon: Inda Castle, MD;  Location: [facility];  Service: Endoscopy;  Laterality: N/A;   OVARIAN CYST REMOVAL  2004   laparotomy -left   ROBOTIC ASSISTED LAPAROSCOPIC LYSIS OF ADHESION N/A 03/21/2014   Procedure: ROBOTIC ASSISTED LAPAROSCOPIC EXTENSIVE LYSIS OF ADHESION (1 Hour);  Surgeon: Marvene Staff,  MD;  Location: [facility];  Service: Gynecology;  Laterality: N/A;   ROBOTIC ASSISTED SALPINGO OOPHERECTOMY Left 03/21/2014   Procedure:  ROBOTIC ASSISTED LEFT OOPHORECTOMY;  Surgeon: Marvene Staff, MD;  Location: [facility];  Service: Gynecology;  Laterality: Left;   rotator cuff surgery  2017   SHOULDER ARTHROSCOPY Left    TENNIS ELBOW RELEASE/NIRSCHEL PROCEDURE Right 05/01/2015   Procedure: RIGHT ELBOW DEBRIDEMENT AND TENDON REPAIR;   Surgeon: [name], MD;  Location: [facility];  Service: Orthopedics;  Laterality: Right;   UPPER GASTROINTESTINAL ENDOSCOPY     WISDOM TOOTH EXTRACTION      Family History  Problem Relation Age of Onset   Arthritis Mother    Stroke Mother    Hypertension Mother    Heart failure Mother    Dementia Mother    Lung cancer Father    Diabetes Sister    Diabetes Brother        x 2   Breast cancer Maternal Aunt    Throat cancer Maternal Aunt        Smoker   Esophageal cancer Maternal Aunt    Prostate cancer Maternal Uncle        x 2   Diabetes Maternal Uncle    Diabetes Paternal Grandmother    Diabetes Paternal Grandfather    Hypertension Other    Hyperlipidemia Other    Colon cancer Neg Hx    Stomach cancer Neg Hx    Rectal cancer Neg Hx    Colon polyps Neg Hx     SOCIAL HX: reviewed.    Current Outpatient Medications:    acyclovir (ZOVIRAX) 400 MG tablet, TAKE 1 TABLET BY MOUTH 2 TIMES DAILY AS NEEDED., Disp: 60 tablet, Rfl: 1   Calcium Carbonate-Vitamin D 600-400 MG-UNIT tablet, Take 1 tablet by mouth daily., Disp: , Rfl:    diphenhydrAMINE (BENADRYL) 12.5 MG/5ML elixir, Take by mouth as needed., Disp: , Rfl:    famotidine (PEPCID) 20 MG tablet, TAKE 1 TABLET BY MOUTH TWICE A DAY, Disp: 60 tablet, Rfl: 1   ferrous sulfate 325 (65 FE) MG tablet, Take 325 mg by mouth daily. , Disp: , Rfl:    Fluocinolone Acetonide Body 0.01 % OIL, Apply to scalp before shampooing. Use for 6-10 hours with the cap each week to every other week., Disp: , Rfl:    fluticasone (FLONASE) 50 MCG/ACT nasal spray, Place 2 sprays into both nostrils daily. To use in place of your Nasonex, Disp: 16 mL, Rfl: 0   mometasone (NASONEX) 50 MCG/ACT nasal spray, PLACE 2 SPRAYS INTO THE NOSE DAILY., Disp: 51 each, Rfl: 1   vitamin E 400 UNIT capsule, Take 400 Units by mouth daily. Reported on 10/09/2015, Disp: , Rfl:   Current Facility-Administered Medications:    0.9 %  sodium chloride  infusion, 500 mL, Intravenous, Once, Armbruster, Carlota Raspberry, MD  EXAM:  GENERAL: alert. Sounds to be in no acute distress.  Answering questions appropriately.   PSYCH/NEURO: pleasant and cooperative, no obvious depression or anxiety, speech and thought processing grossly intact  ASSESSMENT AND PLAN:  Discussed the following assessment and plan:  Problem List Items Addressed This Visit     Barrett's esophagus    Followed by GI.       Environmental allergies    Some sinus drainage.  Continue antihistamine and steroid nasal spray.  Follow.       GERD (gastroesophageal reflux disease)    Under better control since adjusting her diet.  Continue pepcid.  Has seen GI.  Has had extensive w/up.  Follow.       Hypercholesterolemia    The 10-year ASCVD risk score (Arnett DK, et al., 2019) is: 3.9%   Values used to calculate the score:     Age: 13 years     Sex: Female     Is Non-Hispanic African American: Yes     Diabetic: No     Tobacco smoker: No     Systolic Blood Pressure: 747 mmHg     Is BP treated: No     HDL Cholesterol: 79.8 mg/dL     Total Cholesterol: 245 mg/dL  Low cholesterol diet and exercise.  Follow lipid panel.       Hyperglycemia    Watching her diet.  Low carb diet and exercise.  Follow met b and a1c.       Leukopenia    Worked up by hematology.  Concern regarding PPIs contributing.  Follow cbc.       Other neutropenia ([place])    Has been worked up by hematology.  Felt related to PPI.  Follow cbc. Recent wbc count 2.4.  Recheck with net labs.       Plantar fasciitis    Continue f/u with podiatry.        Return in about 2 months (around 03/04/2021) for physical.   I discussed the assessment and treatment plan with the patient. The patient was provided an opportunity to ask questions and all were answered. The patient agreed with the plan and demonstrated an understanding of the instructions.   The patient was advised to call back or seek an in-person  evaluation if the symptoms worsen or if the condition fails to improve as anticipated.  I provided 18 minutes of non-face-to-face time during this encounter.   Einar Pheasant, MD

## 2021-01-02 NOTE — Assessment & Plan Note (Addendum)
The 10-year ASCVD risk score (Arnett DK, et al., 2019) is: 3.9%   Values used to calculate the score:     Age: 55 years     Sex: Female     Is Non-Hispanic African American: Yes     Diabetic: No     Tobacco smoker: No     Systolic Blood Pressure: 903 mmHg     Is BP treated: No     HDL Cholesterol: 79.8 mg/dL     Total Cholesterol: 245 mg/dL  Low cholesterol diet and exercise.  Follow lipid panel.

## 2021-01-05 ENCOUNTER — Ambulatory Visit: Payer: 59 | Admitting: Physical Therapy

## 2021-01-05 ENCOUNTER — Encounter: Payer: Self-pay | Admitting: Internal Medicine

## 2021-01-05 ENCOUNTER — Encounter: Payer: Self-pay | Admitting: Gastroenterology

## 2021-01-05 ENCOUNTER — Other Ambulatory Visit: Payer: Self-pay

## 2021-01-05 ENCOUNTER — Ambulatory Visit (AMBULATORY_SURGERY_CENTER): Payer: 59 | Admitting: Gastroenterology

## 2021-01-05 VITALS — BP 122/88 | HR 72 | Temp 97.5°F | Resp 15 | Ht 66.0 in | Wt 188.0 lb

## 2021-01-05 DIAGNOSIS — Z1211 Encounter for screening for malignant neoplasm of colon: Secondary | ICD-10-CM

## 2021-01-05 DIAGNOSIS — K635 Polyp of colon: Secondary | ICD-10-CM | POA: Diagnosis not present

## 2021-01-05 DIAGNOSIS — D122 Benign neoplasm of ascending colon: Secondary | ICD-10-CM | POA: Diagnosis not present

## 2021-01-05 DIAGNOSIS — M722 Plantar fascial fibromatosis: Secondary | ICD-10-CM | POA: Insufficient documentation

## 2021-01-05 DIAGNOSIS — D127 Benign neoplasm of rectosigmoid junction: Secondary | ICD-10-CM

## 2021-01-05 MED ORDER — SODIUM CHLORIDE 0.9 % IV SOLN
500.0000 mL | Freq: Once | INTRAVENOUS | Status: DC
Start: 2021-01-05 — End: 2021-01-05

## 2021-01-05 NOTE — Progress Notes (Signed)
VS completed by CW.   Pt's states no medical or surgical changes since previsit or office visit.  

## 2021-01-05 NOTE — Assessment & Plan Note (Signed)
Continue f/u with podiatry.

## 2021-01-05 NOTE — Progress Notes (Signed)
Called to room to assist during endoscopic procedure.  Patient ID and intended procedure confirmed with present staff. Received instructions for my participation in the procedure from the performing physician.  

## 2021-01-05 NOTE — Assessment & Plan Note (Signed)
Watching her diet.  Low carb diet and exercise.  Follow met b and a1c.  ?

## 2021-01-05 NOTE — Patient Instructions (Signed)
YOU HAD AN ENDOSCOPIC PROCEDURE TODAY AT THE [facility]:   Refer to the procedure report that was given to you for any specific questions about what was found during the examination.  If the procedure report does not answer your questions, please call your gastroenterologist to clarify.  If you requested that your care partner not be given the details of your procedure findings, then the procedure report has been included in a sealed envelope for you to review at your convenience later.  YOU SHOULD EXPECT: Some feelings of bloating in the abdomen. Passage of more gas than usual.  Walking can help get rid of the air that was put into your GI tract during the procedure and reduce the bloating. If you had a lower endoscopy (such as a colonoscopy or flexible sigmoidoscopy) you may notice spotting of blood in your stool or on the toilet paper. If you underwent a bowel prep for your procedure, you may not have a normal bowel movement for a few days.  Please Note:  You might notice some irritation and congestion in your nose or some drainage.  This is from the oxygen used during your procedure.  There is no need for concern and it should clear up in a day or so.  SYMPTOMS TO REPORT IMMEDIATELY:   Following lower endoscopy (colonoscopy or flexible sigmoidoscopy):  Excessive amounts of blood in the stool  Significant tenderness or worsening of abdominal pains  Swelling of the abdomen that is new, acute  Fever of 100F or higher    For urgent or emergent issues, a gastroenterologist can be reached at any hour by calling [phone]. Do not use MyChart messaging for urgent concerns.    DIET:  We do recommend a small meal at first, but then you may proceed to your regular diet.  Drink plenty of fluids but you should avoid alcoholic beverages for 24 hours.  ACTIVITY:  You should plan to take it easy for the rest of today and you should NOT DRIVE or use heavy machinery until tomorrow  (because of the sedation medicines used during the test).    FOLLOW UP: Our staff will call the number listed on your records 48-72 hours following your procedure to check on you and address any questions or concerns that you may have regarding the information given to you following your procedure. If we do not reach you, we will leave a message.  We will attempt to reach you two times.  During this call, we will ask if you have developed any symptoms of COVID 19. If you develop any symptoms (ie: fever, flu-like symptoms, shortness of breath, cough etc.) before then, please call [phone].  If you test positive for Covid 19 in the 2 weeks post procedure, please call and report this information to us.    If any biopsies were taken you will be contacted by phone or by letter within the next 1-3 weeks.  Please call us at [phone] if you have not heard about the biopsies in 3 weeks.    SIGNATURES/CONFIDENTIALITY: You and/or your care partner have signed paperwork which will be entered into your electronic medical record.  These signatures attest to the fact that that the information above on your After Visit Summary has been reviewed and is understood.  Full responsibility of the confidentiality of this discharge information lies with you and/or your care-partner.   Resume medications. Information given on polyps and diverticulosis. 

## 2021-01-05 NOTE — Assessment & Plan Note (Signed)
Under better control since adjusting her diet.  Continue pepcid.  Has seen GI.  Has had extensive w/up.  Follow.

## 2021-01-05 NOTE — Op Note (Signed)
[facility] Patient Name: Kelly Massey Procedure Date: 01/05/2021 3:14 PM MRN: [id number] Endoscopist: Remo Lipps P. Havery Moros , MD Age: 55 Referring MD:  Date of Birth: Oct 12, 1965 Gender: Female Account #: [id number] Procedure:                Colonoscopy Indications:              Screening for colorectal malignant neoplasm Medicines:                Monitored Anesthesia Care Procedure:                Pre-Anesthesia Assessment:                           - Prior to the procedure, a History and Physical                            was performed, and patient medications and                            allergies were reviewed. The patient's tolerance of                            previous anesthesia was also reviewed. The risks                            and benefits of the procedure and the sedation                            options and risks were discussed with the patient.                            All questions were answered, and informed consent                            was obtained. Prior Anticoagulants: The patient has                            taken no previous anticoagulant or antiplatelet                            agents. ASA Grade Assessment: II - A patient with                            mild systemic disease. After reviewing the risks                            and benefits, the patient was deemed in                            satisfactory condition to undergo the procedure.                           After obtaining informed consent, the colonoscope  was passed under direct vision. Throughout the                            procedure, the patient's blood pressure, pulse, and                            oxygen saturations were monitored continuously. The                            [id number] PCF-[id number] [place] SB Colonoscope was introduced                            through the anus and advanced to the the cecum,                            identified  by appendiceal orifice and ileocecal                            valve. The colonoscopy was performed without                            difficulty. The patient tolerated the procedure                            well. The quality of the bowel preparation was                            good. The ileocecal valve, appendiceal orifice, and                            rectum were photographed. Scope In: 3:16:27 PM Scope Out: 3:42:15 PM Scope Withdrawal Time: 0 hours 17 minutes 29 seconds  Total Procedure Duration: 0 hours 25 minutes 48 seconds  Findings:                 The perianal and digital rectal examinations were                            normal.                           The colon was tortuous with an angulated turn in                            the rectosigmoid which was difficult to traverse.                           A 3 mm polyp was found in the ascending colon. The                            polyp was sessile. The polyp was removed with a                            cold snare. Resection and retrieval were complete.  Two sessile polyps were found in the recto-sigmoid                            colon. The polyps were 3 mm in size. These polyps                            were removed with a cold snare. Resection and                            retrieval were complete.                           A few small-mouthed diverticula were found in the                            sigmoid colon.                           The exam was otherwise without abnormality. Complications:            No immediate complications. Estimated blood loss:                            Minimal. Estimated Blood Loss:     Estimated blood loss was minimal. Impression:               - Tortuous colon.                           - One 3 mm polyp in the ascending colon, removed                            with a cold snare. Resected and retrieved.                           - Two 3 mm polyps at the  recto-sigmoid colon,                            removed with a cold snare. Resected and retrieved.                           - Diverticulosis in the sigmoid colon.                           - The examination was otherwise normal. Recommendation:           - Patient has a contact number available for                            emergencies. The signs and symptoms of potential                            delayed complications were discussed with the                            patient.  Return to normal activities tomorrow.                            Written discharge instructions were provided to the                            patient.                           - Resume previous diet.                           - Continue present medications.                           - Await pathology results. Remo Lipps P. Mykell Rawl, MD 01/05/2021 3:46:18 PM This report has been signed electronically.

## 2021-01-05 NOTE — Assessment & Plan Note (Signed)
Worked up by hematology.  Concern regarding PPIs contributing.  Follow cbc.

## 2021-01-05 NOTE — Progress Notes (Signed)
Sedate, gd SR, tolerated procedure well, VSS, report to RN 

## 2021-01-05 NOTE — Progress Notes (Signed)
[facility] History and Physical   Primary Care Physician:  Einar Pheasant, MD   Reason for Procedure:   Colon cancer screening  Plan:    colonoscopy     HPI: Kelly Massey is a 55 y.o. female  here for colonoscopy screening - last exam done 2012, hyperplastic polyps, no adenomas. Patient denies any bowel symptoms at this time. No family history of colon cancer known. Otherwise feels well without any cardiopulmonary symptoms.    Past Medical History:  Diagnosis Date   Allergy    Anemia    Arthritis    knees - no meds   Colon polyps    HPP_ 2012   GERD (gastroesophageal reflux disease) 10/12/2010   EGD, positive H. pylori   HSV infection    History   Hyperlipidemia    ? no meds - diet controlled   Post-operative nausea and vomiting    Seasonal allergies     Past Surgical History:  Procedure Laterality Date   44 HOUR [place] STUDY N/A 07/25/2019   Procedure: 24 HOUR [place] STUDY;  Surgeon: Lavena Bullion, DO;  [facility]: [facility];  Service: Gastroenterology;  Laterality: N/A;   ABDOMINAL HYSTERECTOMY     BALLOON DILATION N/A 07/24/2012   Procedure: BALLOON DILATION;  Surgeon: Inda Castle, MD;  [facility];  Service: Endoscopy;  Laterality: N/A;   BRAVO [place] STUDY N/A 07/24/2012   Procedure: BRAVO [place] STUDY;  Surgeon: Inda Castle, MD;  [facility];  Service: Endoscopy;  Laterality: N/A;   COLONOSCOPY     DILATION AND CURETTAGE OF UTERUS     SAB   ESOPHAGEAL MANOMETRY N/A 07/25/2019   Procedure: ESOPHAGEAL MANOMETRY (EM);  Surgeon: Lavena Bullion, DO;  [facility];  Service: Gastroenterology;  Laterality: N/A;   ESOPHAGOGASTRODUODENOSCOPY N/A 07/24/2012   Procedure: ESOPHAGOGASTRODUODENOSCOPY (EGD);  Surgeon: Inda Castle, MD;  Location: [facility];  Service: Endoscopy;  Laterality: N/A;   OVARIAN CYST REMOVAL  2004   laparotomy -left   ROBOTIC ASSISTED LAPAROSCOPIC LYSIS OF ADHESION N/A 03/21/2014    Procedure: ROBOTIC ASSISTED LAPAROSCOPIC EXTENSIVE LYSIS OF ADHESION (1 Hour);  Surgeon: Marvene Staff, MD;  Location: [facility];  Service: Gynecology;  Laterality: N/A;   ROBOTIC ASSISTED SALPINGO OOPHERECTOMY Left 03/21/2014   Procedure:  ROBOTIC ASSISTED LEFT OOPHORECTOMY;  Surgeon: Marvene Staff, MD;  Location: [facility];  Service: Gynecology;  Laterality: Left;   rotator cuff surgery  2017   SHOULDER ARTHROSCOPY Left    TENNIS ELBOW RELEASE/NIRSCHEL PROCEDURE Right 05/01/2015   Procedure: RIGHT ELBOW DEBRIDEMENT AND TENDON REPAIR;  Surgeon: [name], MD;  Location: [facility];  Service: Orthopedics;  Laterality: Right;   UPPER GASTROINTESTINAL ENDOSCOPY     WISDOM TOOTH EXTRACTION      Prior to Admission medications   Medication Sig Start Date End Date Taking? Authorizing Provider  Calcium Carbonate-Vitamin D 600-400 MG-UNIT tablet Take 1 tablet by mouth daily.   Yes [provider]  diphenhydrAMINE (BENADRYL) 12.5 MG/5ML elixir Take by mouth as needed.   Yes [provider]  famotidine (PEPCID) 20 MG tablet TAKE 1 TABLET BY MOUTH TWICE A DAY 11/28/20  Yes Christien Berthelot, Carlota Raspberry, MD  ferrous sulfate 325 (65 FE) MG tablet Take 325 mg by mouth daily.    Yes [provider]  Fluocinolone Acetonide Body 0.01 % OIL Apply to scalp before shampooing. Use for 6-10 hours with the cap each week to every other week. 08/19/16  Yes [provider]  fluticasone (FLONASE) 50 MCG/ACT nasal spray Place 2 sprays into both nostrils daily. To use in place of your Nasonex 10/10/20  Yes Nelwyn Salisbury M, PA-C  mometasone (NASONEX) 50 MCG/ACT nasal spray PLACE 2 SPRAYS INTO THE NOSE DAILY. 09/02/20  Yes Einar Pheasant, MD  vitamin E 400 UNIT capsule Take 400 Units by mouth daily. Reported on 10/09/2015   Yes [provider]  acyclovir (ZOVIRAX) 400 MG tablet TAKE 1 TABLET BY MOUTH 2 TIMES DAILY AS NEEDED. 12/01/20   Einar Pheasant, MD     Current Outpatient Medications  Medication Sig Dispense Refill   Calcium Carbonate-Vitamin D 600-400 MG-UNIT tablet Take 1 tablet by mouth daily.     diphenhydrAMINE (BENADRYL) 12.5 MG/5ML elixir Take by mouth as needed.     famotidine (PEPCID) 20 MG tablet TAKE 1 TABLET BY MOUTH TWICE A DAY 60 tablet 1   ferrous sulfate 325 (65 FE) MG tablet Take 325 mg by mouth daily.      Fluocinolone Acetonide Body 0.01 % OIL Apply to scalp before shampooing. Use for 6-10 hours with the cap each week to every other week.     fluticasone (FLONASE) 50 MCG/ACT nasal spray Place 2 sprays into both nostrils daily. To use in place of your Nasonex 16 mL 0   mometasone (NASONEX) 50 MCG/ACT nasal spray PLACE 2 SPRAYS INTO THE NOSE DAILY. 51 each 1   vitamin E 400 UNIT capsule Take 400 Units by mouth daily. Reported on 10/09/2015     acyclovir (ZOVIRAX) 400 MG tablet TAKE 1 TABLET BY MOUTH 2 TIMES DAILY AS NEEDED. 60 tablet 1   Current Facility-Administered Medications  Medication Dose Route Frequency Provider Last Rate Last Admin   0.9 %  sodium chloride infusion  500 mL Intravenous Once Arias Weinert, Carlota Raspberry, MD       0.9 %  sodium chloride infusion  500 mL Intravenous Once Saveah Bahar, Carlota Raspberry, MD        Allergies as of 01/05/2021 - Review Complete 01/05/2021  Allergen Reaction Noted   Adhesive [tape] Other (See Comments) 10/15/2011   Aspirin Nausea And Vomiting 09/06/2011   Ibuprofen Nausea And Vomiting 02/08/2012   Penicillins Diarrhea 02/08/2012   Nsaids Other (See Comments) 10/04/2011   Oxycodone Other (See Comments) 02/08/2012   Shrimp [shellfish allergy] Nausea And Vomiting 10/21/2011    Family History  Problem Relation Age of Onset   Arthritis Mother    Stroke Mother    Hypertension Mother    Heart failure Mother    Dementia Mother    Lung cancer Father    Diabetes Sister    Diabetes Brother        x 2   Breast cancer Maternal Aunt    Throat cancer Maternal Aunt        Smoker    Esophageal cancer Maternal Aunt    Prostate cancer Maternal Uncle        x 2   Diabetes Maternal Uncle    Diabetes Paternal Grandmother    Diabetes Paternal Grandfather    Hypertension Other    Hyperlipidemia Other    Colon cancer Neg Hx    Stomach cancer Neg Hx    Rectal cancer Neg Hx    Colon polyps Neg Hx     Social History   Socioeconomic History   Marital status: Married    Spouse name: Not on file   Number of children: 0   Years of education: Not on  file   Highest education level: Not on file  Occupational History   Occupation: RESEARCH    Employer: [employer]  Tobacco Use   Smoking status: Never   Smokeless tobacco: Never  Vaping Use   Vaping Use: Never used  Substance and Sexual Activity   Alcohol use: Yes    Alcohol/week: 0.0 standard drinks    Comment: occassional   Drug use: No   Sexual activity: Yes    Birth control/protection: None, Surgical    Comment: hysterectomy  Other Topics Concern   Not on file  Social History Narrative   Transport planner    Social Determinants of Health   Financial Resource Strain: Not on file  Food Insecurity: Not on file  Transportation Needs: Not on file  Physical Activity: Not on file  Stress: Not on file  Social Connections: Not on file  Intimate Partner Violence: Not on file    Review of Systems: All other review of systems negative except as mentioned in the HPI.      Physical Exam: Vital signs BP (!) 135/99   Pulse 74   Temp (!) 97.5 F (36.4 C) (Temporal)   Ht 5\' 6"  (1.676 m)   Wt 188 lb (85.3 kg)   LMP 10/06/2011   SpO2 99%   BMI 30.34 kg/m   General:   Alert,  Well-developed, pleasant and cooperative in NAD Lungs:  Clear throughout to auscultation.   Heart:  Regular rate and rhythm Abdomen:  Soft, nontender and nondistended.   Neuro/Psych:  Alert and cooperative. Normal mood and affect. A and O x 3  Jolly Mango, MD [facility]

## 2021-01-05 NOTE — Assessment & Plan Note (Signed)
Followed by GI

## 2021-01-05 NOTE — Assessment & Plan Note (Signed)
Some sinus drainage.  Continue antihistamine and steroid nasal spray.  Follow.

## 2021-01-05 NOTE — Assessment & Plan Note (Addendum)
Has been worked up by hematology.  Felt related to PPI.  Follow cbc. Recent wbc count 2.4.  Recheck with net labs.

## 2021-01-07 ENCOUNTER — Telehealth: Payer: Self-pay

## 2021-01-07 NOTE — Telephone Encounter (Signed)
  Follow up Call-  Call back number 01/05/2021 04/02/2019  Post procedure Call Back phone  # [phone]  Permission to leave phone message Yes Yes  Some recent data might be hidden     Patient questions:  Do you have a fever, pain , or abdominal swelling? No. Pain Score  0 *  Have you tolerated food without any problems? Yes.    Have you been able to return to your normal activities? Yes.    Do you have any questions about your discharge instructions: Diet   No. Medications  No. Follow up visit  No.  Do you have questions or concerns about your Care? No.  Actions: * If pain score is 4 or above: No action needed, pain <4.  Have you developed a fever since your procedure? no  2.   Have you had an respiratory symptoms (SOB or cough) since your procedure? no  3.   Have you tested positive for COVID 19 since your procedure no  4.   Have you had any family members/close contacts diagnosed with the COVID 19 since your procedure?  no   If yes to any of these questions please route to Joylene John, RN and Joella Prince, RN

## 2021-01-08 ENCOUNTER — Other Ambulatory Visit: Payer: Self-pay

## 2021-01-08 ENCOUNTER — Ambulatory Visit: Payer: 59 | Attending: Podiatry | Admitting: Physical Therapy

## 2021-01-08 DIAGNOSIS — M6281 Muscle weakness (generalized): Secondary | ICD-10-CM | POA: Diagnosis present

## 2021-01-08 DIAGNOSIS — M545 Low back pain, unspecified: Secondary | ICD-10-CM | POA: Insufficient documentation

## 2021-01-08 NOTE — Therapy (Signed)
[facility] [address]. [place], [place], [address] Phone: [phone]   Fax:  [phone]  Physical Therapy Treatment  Patient Details  Name: Kelly Massey MRN: [id number] Date of Birth: [birth year] Referring Provider (PT): Levy Pupa, [place]   Encounter Date: 01/08/2021   PT End of Session - 01/08/21 1648     Visit Number 2    Number of Visits 13    Date for PT Re-Evaluation 02/12/21    Authorization Type [employer] 60 combined PT/OT/Speech visits per year - 6 used prior to this episode of care    Progress Note Due on Visit 10    PT Start Time 1645    PT Stop Time 1730    PT Time Calculation (min) 45 min    Activity Tolerance Patient tolerated treatment well;No increased pain    Behavior During Therapy [facility] for tasks assessed/performed             Past Medical History:  Diagnosis Date   Allergy    Anemia    Arthritis    knees - no meds   Colon polyps    HPP_ 2012   GERD (gastroesophageal reflux disease) 10/12/2010   EGD, positive H. pylori   HSV infection    History   Hyperlipidemia    ? no meds - diet controlled   Post-operative nausea and vomiting    Seasonal allergies     Past Surgical History:  Procedure Laterality Date   39 HOUR [place] STUDY N/A 07/25/2019   Procedure: 24 HOUR [place] STUDY;  Surgeon: Lavena Bullion, DO;  [facility];  Service: Gastroenterology;  Laterality: N/A;   ABDOMINAL HYSTERECTOMY     BALLOON DILATION N/A 07/24/2012   Procedure: BALLOON DILATION;  Surgeon: Inda Castle, MD;  [facility];  Service: Endoscopy;  Laterality: N/A;   BRAVO [place] STUDY N/A 07/24/2012   Procedure: BRAVO [place] STUDY;  Surgeon: Inda Castle, MD;  [facility];  Service: Endoscopy;  Laterality: N/A;   COLONOSCOPY     DILATION AND CURETTAGE OF UTERUS     SAB   ESOPHAGEAL MANOMETRY N/A 07/25/2019   Procedure: ESOPHAGEAL MANOMETRY (EM);  Surgeon: Lavena Bullion, DO;  [facility];  Service: Gastroenterology;  Laterality: N/A;   ESOPHAGOGASTRODUODENOSCOPY N/A 07/24/2012   Procedure: ESOPHAGOGASTRODUODENOSCOPY (EGD);  Surgeon: Inda Castle, MD;  Location[facility];  Service: Endoscopy;  Laterality: N/A;   OVARIAN CYST REMOVAL  2004   laparotomy -left   ROBOTIC ASSISTED LAPAROSCOPIC LYSIS OF ADHESION N/A 03/21/2014   Procedure: ROBOTIC ASSISTED LAPAROSCOPIC EXTENSIVE LYSIS OF ADHESION (1 Hour);  Surgeon: Marvene Staff, MD;  Location: [facility];  Service: Gynecology;  Laterality: N/A;   ROBOTIC ASSISTED SALPINGO OOPHERECTOMY Left 03/21/2014   Procedure:  ROBOTIC ASSISTED LEFT OOPHORECTOMY;  Surgeon: Marvene Staff, MD;  Location: [facility];  Service: Gynecology;  Laterality: Left;   rotator cuff surgery  2017   SHOULDER ARTHROSCOPY Left    TENNIS ELBOW RELEASE/NIRSCHEL PROCEDURE Right 05/01/2015   Procedure: RIGHT ELBOW DEBRIDEMENT AND TENDON REPAIR;  Surgeon: [name], MD;  Location: [facility];  Service: Orthopedics;  Laterality: Right;   UPPER GASTROINTESTINAL ENDOSCOPY     WISDOM TOOTH EXTRACTION      There were no vitals filed for this visit.   Subjective Assessment - 01/08/21 1646     Subjective Patient reports doing okay with her initial home exercise program. Patient denies significant  pain at arrival to PT. Patient reports no major issues at work recently. Patient reports performing her walking program seldom recently.    Pertinent History Patient is a 55 year old female with primary complaint of low back pain. Patient reports she was given prednisone taper and was given muscle relaxers. She felt that mm relaxers did not help. Patient reports her pain began insidiously with no apparent injury/incident. Pt reports pain starting around L low back and down to L gluteal region; she has had some pain across and affecting R paraspinal too. Pt reports using salonpas patch along the area and this helped significantly. Patient  reports having some "pinching" or sensation of something "sticking in" the back. Pt denies paresthesias down lower extremities, though she has experienced this before. No disturbed sleep recently with use of Salonpas. (-) Red flag screen    Limitations Sitting;Lifting;Other (comment)   comfort with lying/sleeping   Diagnostic tests Radiographs taken prior to this recurrence of pain - degenerative changes in L-spine    Patient Stated Goals Able to get back into 5 mi/day walking regimen; decreased pain    Pain Onset More than a month ago               OBJECTIVE FINDINGS   FOTO score: 56   AROM Lumbar flexion 75% Lumbar extension 100% Lateral flexion: R 100%, L 100% Thoracolumbar rotation: R 50%, L 75%  SIJ cluster: Negative    TREATMENT   Manual Therapy - for symptom modulation, soft tissue sensitivity and mobility, joint mobility, ROM   STM gluteus medius, T10-L2 R>L longissimus   CPA T3-T10 gr III for mobility, T10-L2 gr II for pain control   Therapeutic Exercise - for improved soft tissue flexibility and extensibility as needed for ROM, improved strength as needed to improve performance of CKC activities/functional movements  -Review of AROM baselines (see above)  Repeated extension in lying; 2x10 Cat Camel; x10 ea dir Open book; x10 ea dir    ASSESSMENT Patient arrives with excellent motivation to participate in physical therapy. She has minimal pain at arrival to PT and tolerates AROM well at beginning of session. She exhibits mild stiffness with truncal rotation. She has mild sensitivity along paraspinal musculature today, but she does report onset of lower lumber axial pain with transition from prone to quadruped and prone to supine on table. She has minimal symptoms in lying following repeated extension in lying. Patient has remaining deficits in ROM, mobility, and low back pain. Patient will benefit from continued skilled therapeutic intervention to address  the above deficits as needed for improved function and QoL.       PT Short Term Goals - 01/02/21 1021       PT SHORT TERM GOAL #1   Title Pt will be independent and 100% compliant with established HEP and activity modification as needed to augment PT intervention and prevent flare-up of back pain as needed for best return to prior level of function.    Baseline 01/01/21: HEP initiated, activity modification discussed    Time 3    Period Weeks    Status New    Target Date 01/22/21      PT SHORT TERM GOAL #2   Title Patient will have full thoracolumbar AROM without reproduction of pain as needed for functional reaching, self-care ADLs, bending, household chores.    Baseline 01/01/21: Pain and motion loss with flexion, motion loss c extension, pain with R lateral flexion, motion loss c bilat thoracolumbar rotation.  Time 4    Period Weeks    Status New    Target Date 01/29/21               PT Long Term Goals - 01/08/21 1710       PT LONG TERM GOAL #1   Title Patient will demonstrate improved function as evidenced by reaching FOTO score of 74, indicative of full participation in activities at home and in the community.    Baseline 01/01/21: FOTO to be obtained next visit.   01/08/21: 56    Time 6    Period Weeks    Target Date 02/12/21      PT LONG TERM GOAL #2   Title Pt will decrease worst back pain as reported on NPRS by at least 2 points in order to demonstrate clinically significant reduction in back pain.    Baseline 01/01/21: Pain 10/10 at worst    Time 6    Period Weeks    Status New    Target Date 02/12/21      PT LONG TERM GOAL #3   Title Patient will demonstrate box lift from floor to waist and box transfer from surface to surface simulating work duties with load usually managed in working environment with sound body mechanics and no reproduction of back pain    Baseline 02/12/21: Patient significantly limited in performance of lifting at this time    Time 6     Period Weeks    Status New    Target Date 02/12/21                   Plan - 01/09/21 1531     Clinical Impression Statement Patient arrives with excellent motivation to participate in physical therapy. She has minimal pain at arrival to PT and tolerates AROM well at beginning of session. She exhibits mild stiffness with truncal rotation. She has mild sensitivity along paraspinal musculature today, but she does report onset of lower lumber axial pain with transition from prone to quadruped and prone to supine on table. She has minimal symptoms in lying following repeated extension in lying. Patient has remaining deficits in ROM, mobility, and low back pain. Patient will benefit from continued skilled therapeutic intervention to address the above deficits as needed for improved function and QoL.    Personal Factors and Comorbidities Comorbidity 3+;Profession    Comorbidities GERD, Barret's esophagus, high cholesterol, leukopenia    Examination-Activity Limitations Sit;Lift;Squat;Sleep;Carry;Bend;Bed Mobility    Examination-Participation Restrictions Community Activity;Yard Work;Occupation    Stability/Clinical Decision Making Evolving/Moderate complexity    Rehab Potential Good    Clinical Impairments Affecting Rehab Potential Positive: Age. Motivation. Active lifestyle. Negative: recurrent episode of low back pain    PT Frequency 2x / week    PT Duration 6 weeks    PT Treatment/Interventions Electrical Stimulation;Therapeutic activities;Therapeutic exercise;Neuromuscular re-education;Manual techniques;Patient/family education;Dry needling;Spinal Manipulations    PT Next Visit Plan MDT extension principle with modification or force progression prn, manual therapy to address mobility deficits or soft tissue sensitivity, graded movement and progressive strengthening as able    PT Home Exercise Plan Access Code [email or web address]    Consulted and Agree with Plan of Care Patient              Patient will benefit from skilled therapeutic intervention in order to improve the following deficits and impairments:  Decreased range of motion, Decreased strength, Pain, Impaired flexibility  Visit Diagnosis: Bilateral low back pain without sciatica, unspecified chronicity  Muscle  weakness (generalized)     Problem List Patient Active Problem List   Diagnosis Date Noted   Plantar fasciitis 01/05/2021   Hyperglycemia 07/23/2020   Sciatica 02/23/2020   Tinnitus 12/02/2019   Belching    Globus sensation    Hot flashes 07/01/2019   Loss of taste 02/05/2019   Headache 08/28/2018   Exposure to COVID-19 virus 08/24/2018   Chest pain 10/30/2017   Witnessed apneic spells 10/31/2016   Other neutropenia ([place]) 08/10/2016   Vertigo 07/28/2016   Health care maintenance 11/21/2014   History of colonic polyps 11/21/2014   Dysphagia 11/19/2014   Trochanteric bursitis of left hip 10/08/2014   URI (upper respiratory infection) 02/10/2014   Pelvic pain in female 12/25/2013   Personal history of ovarian cyst 12/25/2013   Change in bowel movement 11/11/2013   breast tenderness 09/29/2013   Dysphagia, unspecified(787.20) 06/12/2012   Barrett's esophagus 06/12/2012   Environmental allergies 02/13/2012   Enlarged thyroid 02/13/2012   GERD (gastroesophageal reflux disease) 02/08/2012   Leukopenia 02/08/2012   Fatigue 02/08/2012   Hypercholesterolemia 02/08/2012   Valentina Gu, PT, DPT #[id number]  Eilleen Kempf, PT 01/09/2021, 3:32 PM  [facility] [address]. [place], [place], [address] Phone: [phone]   Fax:  [phone]  Name: Kelly Massey MRN: [id number] Date of Birth: Oct 14, 1965

## 2021-01-09 ENCOUNTER — Encounter: Payer: Self-pay | Admitting: Physical Therapy

## 2021-01-12 ENCOUNTER — Encounter: Payer: Self-pay | Admitting: Physical Therapy

## 2021-01-12 ENCOUNTER — Other Ambulatory Visit: Payer: Self-pay

## 2021-01-12 ENCOUNTER — Ambulatory Visit: Payer: 59 | Admitting: Physical Therapy

## 2021-01-12 DIAGNOSIS — M545 Low back pain, unspecified: Secondary | ICD-10-CM

## 2021-01-12 DIAGNOSIS — M6281 Muscle weakness (generalized): Secondary | ICD-10-CM

## 2021-01-12 NOTE — Therapy (Signed)
[facility] [address] [place], [place], [address] Phone: [phone]   Fax:  [phone]  Physical Therapy Treatment  Patient Details  Name: Kelly Massey MRN: [id number] Date of Birth: 12-26-1965 Referring Provider (PT): Levy Pupa, [place]   Encounter Date: 01/12/2021   PT End of Session - 01/12/21 1658     Visit Number 3    Number of Visits 13    Date for PT Re-Evaluation 02/12/21    Authorization Type U[employer] 60 combined PT/OT/Speech visits per year - 6 used prior to this episode of care    Progress Note Due on Visit 10    PT Start Time 1633    PT Stop Time 1713    PT Time Calculation (min) 40 min    Activity Tolerance Patient tolerated treatment well;No increased pain    Behavior During Therapy [facility] for tasks assessed/performed             Past Medical History:  Diagnosis Date   Allergy    Anemia    Arthritis    knees - no meds   Colon polyps    HPP_ 2012   GERD (gastroesophageal reflux disease) 10/12/2010   EGD, positive H. pylori   HSV infection    History   Hyperlipidemia    ? no meds - diet controlled   Post-operative nausea and vomiting    Seasonal allergies     Past Surgical History:  Procedure Laterality Date   18 HOUR [place] STUDY N/A 07/25/2019   Procedure: 24 HOUR [place] STUDY;  Surgeon: Lavena Bullion, DO;  Location: [facility];  Service: Gastroenterology;  Laterality: N/A;   ABDOMINAL HYSTERECTOMY     BALLOON DILATION N/A 07/24/2012   Procedure: BALLOON DILATION;  Surgeon: Inda Castle, MD;  [facility]: [facility];  Service: Endoscopy;  Laterality: N/A;   BRAVO [place] STUDY N/A 07/24/2012   Procedure: BRAVO [place] STUDY;  Surgeon: Inda Castle, MD;  [facility]: [facility];  Service: Endoscopy;  Laterality: N/A;   COLONOSCOPY     DILATION AND CURETTAGE OF UTERUS     SAB   ESOPHAGEAL MANOMETRY N/A 07/25/2019   Procedure: ESOPHAGEAL MANOMETRY (EM);  Surgeon: Lavena Bullion, DO;  [facility];  Service: Gastroenterology;  Laterality: N/A;   ESOPHAGOGASTRODUODENOSCOPY N/A 07/24/2012   Procedure: ESOPHAGOGASTRODUODENOSCOPY (EGD);  Surgeon: Inda Castle, MD;  Location: [facility];  Service: Endoscopy;  Laterality: N/A;   OVARIAN CYST REMOVAL  2004   laparotomy -left   ROBOTIC ASSISTED LAPAROSCOPIC LYSIS OF ADHESION N/A 03/21/2014   Procedure: ROBOTIC ASSISTED LAPAROSCOPIC EXTENSIVE LYSIS OF ADHESION (1 Hour);  Surgeon: Marvene Staff, MD;  Location: [facility];  Service: Gynecology;  Laterality: N/A;   ROBOTIC ASSISTED SALPINGO OOPHERECTOMY Left 03/21/2014   Procedure:  ROBOTIC ASSISTED LEFT OOPHORECTOMY;  Surgeon: Marvene Staff, MD;  Location: [facility];  Service: Gynecology;  Laterality: Left;   rotator cuff surgery  2017   SHOULDER ARTHROSCOPY Left    TENNIS ELBOW RELEASE/NIRSCHEL PROCEDURE Right 05/01/2015   Procedure: RIGHT ELBOW DEBRIDEMENT AND TENDON REPAIR;  Surgeon: [name], MD;  Location: [place];  Service: Orthopedics;  Laterality: Right;   UPPER GASTROINTESTINAL ENDOSCOPY     WISDOM TOOTH EXTRACTION      There were no vitals filed for this visit.   Subjective Assessment - 01/12/21 1634     Subjective Patient reports no pain at arrival to PT. She reports working in her  yard - pulling weeds and pruning - and she states she had low back tightness after this. She states this improved with her exercises. Patient reports no recent bouts with gluteal pain or lower limb pain. Patient reports compliance with her HEP.    Pertinent History Patient is a 55 year old female with primary complaint of low back pain. Patient reports she was given prednisone taper and was given muscle relaxers. She felt that mm relaxers did not help. Patient reports her pain began insidiously with no apparent injury/incident. Pt reports pain starting around L low back and down to L gluteal region; she has had some pain across and affecting R paraspinal too. Pt  reports using salonpas patch along the area and this helped significantly. Patient reports having some "pinching" or sensation of something "sticking in" the back. Pt denies paresthesias down lower extremities, though she has experienced this before. No disturbed sleep recently with use of Salonpas. (-) Red flag screen    Limitations Sitting;Lifting;Other (comment)   comfort with lying/sleeping   Diagnostic tests Radiographs taken prior to this recurrence of pain - degenerative changes in L-spine    Patient Stated Goals Able to get back into 5 mi/day walking regimen; decreased pain    Pain Onset More than a month ago                TREATMENT     Manual Therapy - for symptom modulation, soft tissue sensitivity and mobility, joint mobility, ROM    STM/DTM T10-L2 R>L longissimus    CPA T3-T10 gr III for mobility, T10-L5 gr II for pain control    *not today* STM gluteus medius     Therapeutic Exercise - for improved soft tissue flexibility and extensibility as needed for ROM, improved strength as needed to improve performance of CKC activities/functional movements   Cat Camel; x10 ea dir Open book; x10 ea dir Prone alt hip extension; 1x10 alternating bilateral LE  Dying bug; 2x10 alternating [heavy verbal and tactile cueing for technique]  *not today* Repeated extension in lying; 2x10       ASSESSMENT Patient had no significant pain at arrival or during session today. She has had low workload at her job recently and this can contribute to limited symptoms at this time. She is doing well with maintaining her HEP and has tolerated heavy work in her yard (pulling weeds, trimming branches/pruning) well with only moderate sensation of low back "tightness." Patient is able to progress with exercises focused on isometric lumbar paraspinal and gluteal loading without notable pain. Patient has remaining deficits in thoracolumbar AROM, hip strength, thoracic spine mobility, sagittal and  transverse plane range of motion, and intermittent thoracolumbar pain with variable laterality. Patient will benefit from continued skilled therapeutic intervention to address the above deficits as needed for improved function and QoL.       PT Short Term Goals - 01/02/21 1021       PT SHORT TERM GOAL #1   Title Pt will be independent and 100% compliant with established HEP and activity modification as needed to augment PT intervention and prevent flare-up of back pain as needed for best return to prior level of function.    Baseline 01/01/21: HEP initiated, activity modification discussed    Time 3    Period Weeks    Status New    Target Date 01/22/21      PT SHORT TERM GOAL #2   Title Patient will have full thoracolumbar AROM without reproduction of pain as  needed for functional reaching, self-care ADLs, bending, household chores.    Baseline 01/01/21: Pain and motion loss with flexion, motion loss c extension, pain with R lateral flexion, motion loss c bilat thoracolumbar rotation.    Time 4    Period Weeks    Status New    Target Date 01/29/21               PT Long Term Goals - 01/08/21 1710       PT LONG TERM GOAL #1   Title Patient will demonstrate improved function as evidenced by reaching FOTO score of 74, indicative of full participation in activities at home and in the community.    Baseline 01/01/21: FOTO to be obtained next visit.   01/08/21: 56    Time 6    Period Weeks    Target Date 02/12/21      PT LONG TERM GOAL #2   Title Pt will decrease worst back pain as reported on NPRS by at least 2 points in order to demonstrate clinically significant reduction in back pain.    Baseline 01/01/21: Pain 10/10 at worst    Time 6    Period Weeks    Status New    Target Date 02/12/21      PT LONG TERM GOAL #3   Title Patient will demonstrate box lift from floor to waist and box transfer from surface to surface simulating work duties with load usually managed in working  environment with sound body mechanics and no reproduction of back pain    Baseline 02/12/21: Patient significantly limited in performance of lifting at this time    Time 6    Period Weeks    Status New    Target Date 02/12/21                   Plan - 01/13/21 1154     Clinical Impression Statement Patient had no significant pain at arrival or during session today. She has had low workload at her job recently and this can contribute to limited symptoms at this time. She is doing well with maintaining her HEP and has tolerated heavy work in her yard (pulling weeds, trimming branches/pruning) well with only moderate sensation of low back "tightness." Patient is able to progress with exercises focused on isometric lumbar paraspinal and gluteal loading without notable pain. Patient has remaining deficits in thoracolumbar AROM, hip strength, thoracic spine mobility, sagittal and transverse plane range of motion, and intermittent thoracolumbar pain with variable laterality. Patient will benefit from continued skilled therapeutic intervention to address the above deficits as needed for improved function and QoL.    Personal Factors and Comorbidities Comorbidity 3+;Profession    Comorbidities GERD, Barret's esophagus, high cholesterol, leukopenia    Examination-Activity Limitations Sit;Lift;Squat;Sleep;Carry;Bend;Bed Mobility    Examination-Participation Restrictions Community Activity;Yard Work;Occupation    Stability/Clinical Decision Making Evolving/Moderate complexity    Rehab Potential Good    Clinical Impairments Affecting Rehab Potential Positive: Age. Motivation. Active lifestyle. Negative: recurrent episode of low back pain    PT Frequency 2x / week    PT Duration 6 weeks    PT Treatment/Interventions Electrical Stimulation;Therapeutic activities;Therapeutic exercise;Neuromuscular re-education;Manual techniques;Patient/family education;Dry needling;Spinal Manipulations    PT Next Visit  Plan MDT extension principle with modification or force progression prn, manual therapy to address mobility deficits or soft tissue sensitivity, graded movement and progressive strengthening as able    PT Home Exercise Plan Access Code [email or web address]    Consulted and Agree  with Plan of Care Patient             Patient will benefit from skilled therapeutic intervention in order to improve the following deficits and impairments:  Decreased range of motion, Decreased strength, Pain, Impaired flexibility  Visit Diagnosis: Bilateral low back pain without sciatica, unspecified chronicity  Muscle weakness (generalized)     Problem List Patient Active Problem List   Diagnosis Date Noted   Plantar fasciitis 01/05/2021   Hyperglycemia 07/23/2020   Sciatica 02/23/2020   Tinnitus 12/02/2019   Belching    Globus sensation    Hot flashes 07/01/2019   Loss of taste 02/05/2019   Headache 08/28/2018   Exposure to COVID-19 virus 08/24/2018   Chest pain 10/30/2017   Witnessed apneic spells 10/31/2016   Other neutropenia ([place]) 08/10/2016   Vertigo 07/28/2016   Health care maintenance 11/21/2014   History of colonic polyps 11/21/2014   Dysphagia 11/19/2014   Trochanteric bursitis of left hip 10/08/2014   URI (upper respiratory infection) 02/10/2014   Pelvic pain in female 12/25/2013   Personal history of ovarian cyst 12/25/2013   Change in bowel movement 11/11/2013   breast tenderness 09/29/2013   Dysphagia, unspecified(787.20) 06/12/2012   Barrett's esophagus 06/12/2012   Environmental allergies 02/13/2012   Enlarged thyroid 02/13/2012   GERD (gastroesophageal reflux disease) 02/08/2012   Leukopenia 02/08/2012   Fatigue 02/08/2012   Hypercholesterolemia 02/08/2012   Valentina Gu, PT, DPT #[id number]  Eilleen Kempf, PT 01/13/2021, 11:58 AM  [facility] [address] [place], [place], [address] Phone: [phone]   Fax:   [phone]  Name: VERDEAN MURIN MRN: [id number] Date of Birth: May 26, 1965

## 2021-01-15 ENCOUNTER — Encounter: Payer: 59 | Admitting: Physical Therapy

## 2021-01-19 ENCOUNTER — Ambulatory Visit: Payer: 59 | Admitting: Physical Therapy

## 2021-01-19 ENCOUNTER — Other Ambulatory Visit: Payer: Self-pay

## 2021-01-19 ENCOUNTER — Encounter: Payer: Self-pay | Admitting: Physical Therapy

## 2021-01-19 DIAGNOSIS — M545 Low back pain, unspecified: Secondary | ICD-10-CM | POA: Diagnosis not present

## 2021-01-19 DIAGNOSIS — M6281 Muscle weakness (generalized): Secondary | ICD-10-CM

## 2021-01-19 NOTE — Therapy (Signed)
[facility] [address] [place], [place], [address] Phone: [phone]   Fax:  [phone]  Physical Therapy Treatment  Patient Details  Name: Kelly Massey MRN: [id number] Date of Birth: 07-05-1965 Referring Provider (PT): Levy Pupa, [place]   Encounter Date: 01/19/2021   PT End of Session - 01/20/21 1538     Visit Number 4    Number of Visits 13    Date for PT Re-Evaluation 02/12/21    Authorization Type U[employer] 60 combined PT/OT/Speech visits per year - 6 used prior to this episode of care    Progress Note Due on Visit 10    PT Start Time 1631    PT Stop Time 1718    PT Time Calculation (min) 47 min    Activity Tolerance Patient tolerated treatment well;No increased pain    Behavior During Therapy [facility] for tasks assessed/performed             Past Medical History:  Diagnosis Date   Allergy    Anemia    Arthritis    knees - no meds   Colon polyps    HPP_ 2012   GERD (gastroesophageal reflux disease) 10/12/2010   EGD, positive H. pylori   HSV infection    History   Hyperlipidemia    ? no meds - diet controlled   Post-operative nausea and vomiting    Seasonal allergies     Past Surgical History:  Procedure Laterality Date   62 HOUR [place] STUDY N/A 07/25/2019   Procedure: 24 HOUR [place] STUDY;  Surgeon: Lavena Bullion, DO;  [facility]: [facility];  Service: Gastroenterology;  Laterality: N/A;   ABDOMINAL HYSTERECTOMY     BALLOON DILATION N/A 07/24/2012   Procedure: BALLOON DILATION;  Surgeon: Inda Castle, MD;  [facility]: [facility];  Service: Endoscopy;  Laterality: N/A;   BRAVO [place] STUDY N/A 07/24/2012   Procedure: BRAVO [place] STUDY;  Surgeon: Inda Castle, MD;  [facility];  Service: Endoscopy;  Laterality: N/A;   COLONOSCOPY     DILATION AND CURETTAGE OF UTERUS     SAB   ESOPHAGEAL MANOMETRY N/A 07/25/2019   Procedure: ESOPHAGEAL MANOMETRY (EM);  Surgeon: Lavena Bullion, DO;  [facility];  Service: Gastroenterology;  Laterality: N/A;   ESOPHAGOGASTRODUODENOSCOPY N/A 07/24/2012   Procedure: ESOPHAGOGASTRODUODENOSCOPY (EGD);  Surgeon: Inda Castle, MD;  Location: [facility];  Service: Endoscopy;  Laterality: N/A;   OVARIAN CYST REMOVAL  2004   laparotomy -left   ROBOTIC ASSISTED LAPAROSCOPIC LYSIS OF ADHESION N/A 03/21/2014   Procedure: ROBOTIC ASSISTED LAPAROSCOPIC EXTENSIVE LYSIS OF ADHESION (1 Hour);  Surgeon: Marvene Staff, MD;  Location: [facility];  Service: Gynecology;  Laterality: N/A;   ROBOTIC ASSISTED SALPINGO OOPHERECTOMY Left 03/21/2014   Procedure:  ROBOTIC ASSISTED LEFT OOPHORECTOMY;  Surgeon: Marvene Staff, MD;  Location: [facility];  Service: Gynecology;  Laterality: Left;   rotator cuff surgery  2017   SHOULDER ARTHROSCOPY Left    TENNIS ELBOW RELEASE/NIRSCHEL PROCEDURE Right 05/01/2015   Procedure: RIGHT ELBOW DEBRIDEMENT AND TENDON REPAIR;  Surgeon: [name], MD;  Location: [place];  Service: Orthopedics;  Laterality: Right;   UPPER GASTROINTESTINAL ENDOSCOPY     WISDOM TOOTH EXTRACTION      There were no vitals filed for this visit.   Subjective Assessment - 01/19/21 1632     Subjective Patient reports no pain at arrival to PT. Patient denies issues since her  last visit. Patient reports doing well with her home exercises. Patient reports adding bridges to her home exercises.    Pertinent History Patient is a 55 year old female with primary complaint of low back pain. Patient reports she was given prednisone taper and was given muscle relaxers. She felt that mm relaxers did not help. Patient reports her pain began insidiously with no apparent injury/incident. Pt reports pain starting around L low back and down to L gluteal region; she has had some pain across and affecting R paraspinal too. Pt reports using salonpas patch along the area and this helped significantly. Patient reports having some "pinching" or  sensation of something "sticking in" the back. Pt denies paresthesias down lower extremities, though she has experienced this before. No disturbed sleep recently with use of Salonpas. (-) Red flag screen    Limitations Sitting;Lifting;Other (comment)   comfort with lying/sleeping   Diagnostic tests Radiographs taken prior to this recurrence of pain - degenerative changes in L-spine    Patient Stated Goals Able to get back into 5 mi/day walking regimen; decreased pain    Pain Onset More than a month ago             OBJECTIVE FINDINGS  AROM Lumbar flexion 75% "pull" low back/hamstrings Lumbar extension 100% low back axial discomfort  Lateral flexion: R 100%, L 100% Thoracolumbar rotation: R 75%, L 100%        TREATMENT     Manual Therapy - for symptom modulation, soft tissue sensitivity and mobility, joint mobility, ROM    STM/DTM T10-L2 R>L longissimus [minimal soft tissue sensitivity today]   CPA T3-T10 gr III for mobility, T10-L5 gr II for pain control     *not today* STM gluteus medius       Therapeutic Exercise - for improved soft tissue flexibility and extensibility as needed for ROM, improved strength as needed to improve performance of CKC activities/functional movements     Cat Camel; x10 ea dir Open book; x10 ea dir Prone alt hip extension; 1x10 alternating bilateral LE  Dying bug; 2x10 alternating [heavy verbal and tactile cueing for technique] Bird dog; x8 alternating [heavy verbal and tactile cueing to avoid excessive truncal rotation and pelvic tilt] Bridge with neutral-spine position; 2x10 Nautilus core walkout, with belt around waist; 70-lbs; x10; 3 directions (posterior, left, right)  *not today* Repeated extension in lying; 2x10       ASSESSMENT Patient has grossly WNL thoracolumbar AROM with only exception of end-range flexion with posterior chain tightness and R rotation. R rotation mobility is modestly improved following manual therapy and  active mobility work. Continued progression of gluteal strengthening and isometric core work without notable pain. Patient reports feeling well at end of session following significant increase in exercise volume. Patient has remaining deficits in mild thoracolumbar AROM loss, hip strength, thoracic spine mobility, sagittal and transverse plane range of motion, and intermittent thoracolumbar pain with variable laterality. Patient will benefit from continued skilled therapeutic intervention to address the above deficits as needed for improved function and QoL.       PT Short Term Goals - 01/02/21 1021       PT SHORT TERM GOAL #1   Title Pt will be independent and 100% compliant with established HEP and activity modification as needed to augment PT intervention and prevent flare-up of back pain as needed for best return to prior level of function.    Baseline 01/01/21: HEP initiated, activity modification discussed    Time 3  Period Weeks    Status New    Target Date 01/22/21      PT SHORT TERM GOAL #2   Title Patient will have full thoracolumbar AROM without reproduction of pain as needed for functional reaching, self-care ADLs, bending, household chores.    Baseline 01/01/21: Pain and motion loss with flexion, motion loss c extension, pain with R lateral flexion, motion loss c bilat thoracolumbar rotation.    Time 4    Period Weeks    Status New    Target Date 01/29/21               PT Long Term Goals - 01/08/21 1710       PT LONG TERM GOAL #1   Title Patient will demonstrate improved function as evidenced by reaching FOTO score of 74, indicative of full participation in activities at home and in the community.    Baseline 01/01/21: FOTO to be obtained next visit.   01/08/21: 56    Time 6    Period Weeks    Target Date 02/12/21      PT LONG TERM GOAL #2   Title Pt will decrease worst back pain as reported on NPRS by at least 2 points in order to demonstrate clinically significant  reduction in back pain.    Baseline 01/01/21: Pain 10/10 at worst    Time 6    Period Weeks    Status New    Target Date 02/12/21      PT LONG TERM GOAL #3   Title Patient will demonstrate box lift from floor to waist and box transfer from surface to surface simulating work duties with load usually managed in working environment with sound body mechanics and no reproduction of back pain    Baseline 02/12/21: Patient significantly limited in performance of lifting at this time    Time 6    Period Weeks    Status New    Target Date 02/12/21                   Plan - 01/20/21 1544     Clinical Impression Statement Patient has grossly WNL thoracolumbar AROM with only exception of end-range flexion with posterior chain tightness and R rotation. R rotation mobility is modestly improved following manual therapy and active mobility work. Continued progression of gluteal strengthening and isometric core work without notable pain. Patient reports feeling well at end of session following significant increase in exercise volume. Patient has remaining deficits in mild thoracolumbar AROM loss, hip strength, thoracic spine mobility, sagittal and transverse plane range of motion, and intermittent thoracolumbar pain with variable laterality. Patient will benefit from continued skilled therapeutic intervention to address the above deficits as needed for improved function and QoL.    Personal Factors and Comorbidities Comorbidity 3+;Profession    Comorbidities GERD, Barret's esophagus, high cholesterol, leukopenia    Examination-Activity Limitations Sit;Lift;Squat;Sleep;Carry;Bend;Bed Mobility    Examination-Participation Restrictions Community Activity;Yard Work;Occupation    Stability/Clinical Decision Making Evolving/Moderate complexity    Rehab Potential Good    Clinical Impairments Affecting Rehab Potential Positive: Age. Motivation. Active lifestyle. Negative: recurrent episode of low back pain     PT Frequency 2x / week    PT Duration 6 weeks    PT Treatment/Interventions Electrical Stimulation;Therapeutic activities;Therapeutic exercise;Neuromuscular re-education;Manual techniques;Patient/family education;Dry needling;Spinal Manipulations    PT Next Visit Plan MDT extension principle with modification or force progression prn, manual therapy to address mobility deficits or soft tissue sensitivity, graded movement and  progressive strengthening as able    PT Home Exercise Plan Access Code [email or web address]    Consulted and Agree with Plan of Care Patient             Patient will benefit from skilled therapeutic intervention in order to improve the following deficits and impairments:  Decreased range of motion, Decreased strength, Pain, Impaired flexibility  Visit Diagnosis: Bilateral low back pain without sciatica, unspecified chronicity  Muscle weakness (generalized)     Problem List Patient Active Problem List   Diagnosis Date Noted   Plantar fasciitis 01/05/2021   Hyperglycemia 07/23/2020   Sciatica 02/23/2020   Tinnitus 12/02/2019   Belching    Globus sensation    Hot flashes 07/01/2019   Loss of taste 02/05/2019   Headache 08/28/2018   Exposure to COVID-19 virus 08/24/2018   Chest pain 10/30/2017   Witnessed apneic spells 10/31/2016   Other neutropenia ([place]) 08/10/2016   Vertigo 07/28/2016   Health care maintenance 11/21/2014   History of colonic polyps 11/21/2014   Dysphagia 11/19/2014   Trochanteric bursitis of left hip 10/08/2014   URI (upper respiratory infection) 02/10/2014   Pelvic pain in female 12/25/2013   Personal history of ovarian cyst 12/25/2013   Change in bowel movement 11/11/2013   breast tenderness 09/29/2013   Dysphagia, unspecified(787.20) 06/12/2012   Barrett's esophagus 06/12/2012   Environmental allergies 02/13/2012   Enlarged thyroid 02/13/2012   GERD (gastroesophageal reflux disease) 02/08/2012   Leukopenia 02/08/2012   Fatigue  02/08/2012   Hypercholesterolemia 02/08/2012   Valentina Gu, PT, DPT #[id number]  Eilleen Kempf, PT 01/20/2021, 3:44 PM  [place] [facility] [address] [place], [place], [address] Phone: [phone]   Fax:  [phone]  Name: Kelly Massey MRN: [id number] Date of Birth: 02/26/1966

## 2021-01-20 ENCOUNTER — Ambulatory Visit: Payer: 59

## 2021-01-22 ENCOUNTER — Encounter: Payer: 59 | Admitting: Physical Therapy

## 2021-01-26 ENCOUNTER — Encounter: Payer: 59 | Admitting: Physical Therapy

## 2021-01-29 ENCOUNTER — Encounter: Payer: 59 | Admitting: Physical Therapy

## 2021-01-29 ENCOUNTER — Ambulatory Visit: Payer: 59 | Admitting: Physical Therapy

## 2021-01-29 ENCOUNTER — Other Ambulatory Visit: Payer: Self-pay

## 2021-01-29 ENCOUNTER — Encounter: Payer: Self-pay | Admitting: Physical Therapy

## 2021-01-29 DIAGNOSIS — M545 Low back pain, unspecified: Secondary | ICD-10-CM | POA: Diagnosis not present

## 2021-01-29 DIAGNOSIS — M6281 Muscle weakness (generalized): Secondary | ICD-10-CM

## 2021-01-29 NOTE — Therapy (Addendum)
[facility] [facility] [address] [place], [place], [address] Phone: [phone]   Fax:  [phone]  Physical Therapy Treatment  Patient Details  Name: Kelly Massey MRN: [id number] Date of Birth: [birth year] Referring Provider (PT): Levy Pupa, [place]   Encounter Date: 01/29/2021   PT End of Session - 02/02/21 0608     Visit Number 5    Number of Visits 13    Date for PT Re-Evaluation 02/12/21    Authorization Type [employer] 60 combined PT/OT/Speech visits per year - 6 used prior to this episode of care    Progress Note Due on Visit 10    PT Start Time 1649    PT Stop Time 1730    PT Time Calculation (min) 41 min    Activity Tolerance Patient tolerated treatment well;No increased pain    Behavior During Therapy [facility] for tasks assessed/performed             Past Medical History:  Diagnosis Date   Allergy    Anemia    Arthritis    knees - no meds   Colon polyps    HPP_ 2012   GERD (gastroesophageal reflux disease) 10/12/2010   EGD, positive H. pylori   HSV infection    History   Hyperlipidemia    ? no meds - diet controlled   Post-operative nausea and vomiting    Seasonal allergies     Past Surgical History:  Procedure Laterality Date   31 HOUR [place] STUDY N/A 07/25/2019   Procedure: 24 HOUR [place] STUDY;  Surgeon: Lavena Bullion, DO;  Location: [facility];  Service: Gastroenterology;  Laterality: N/A;   ABDOMINAL HYSTERECTOMY     BALLOON DILATION N/A 07/24/2012   Procedure: BALLOON DILATION;  Surgeon: Inda Castle, MD;  Location: [facility];  Service: Endoscopy;  Laterality: N/A;   BRAVO [place] STUDY N/A 07/24/2012   Procedure: BRAVO [place] STUDY;  Surgeon: Inda Castle, MD;  Location: [facility];  Service: Endoscopy;  Laterality: N/A;   COLONOSCOPY     DILATION AND CURETTAGE OF UTERUS     SAB   ESOPHAGEAL MANOMETRY N/A 07/25/2019   Procedure: ESOPHAGEAL MANOMETRY (EM);  Surgeon: Lavena Bullion, DO;  Location: [facility];  Service: Gastroenterology;  Laterality: N/A;   ESOPHAGOGASTRODUODENOSCOPY N/A 07/24/2012   Procedure: ESOPHAGOGASTRODUODENOSCOPY (EGD);  Surgeon: Inda Castle, MD;  Location: [facility];  Service: Endoscopy;  Laterality: N/A;   OVARIAN CYST REMOVAL  2004   laparotomy -left   ROBOTIC ASSISTED LAPAROSCOPIC LYSIS OF ADHESION N/A 03/21/2014   Procedure: ROBOTIC ASSISTED LAPAROSCOPIC EXTENSIVE LYSIS OF ADHESION (1 Hour);  Surgeon: Marvene Staff, MD;  Location: [facility];  Service: Gynecology;  Laterality: N/A;   ROBOTIC ASSISTED SALPINGO OOPHERECTOMY Left 03/21/2014   Procedure:  ROBOTIC ASSISTED LEFT OOPHORECTOMY;  Surgeon: Marvene Staff, MD;  Location: [facility];  Service: Gynecology;  Laterality: Left;   rotator cuff surgery  2017   SHOULDER ARTHROSCOPY Left    TENNIS ELBOW RELEASE/NIRSCHEL PROCEDURE Right 05/01/2015   Procedure: RIGHT ELBOW DEBRIDEMENT AND TENDON REPAIR;  Surgeon: [name], MD;  Location: [facility];  Service: Orthopedics;  Laterality: Right;   UPPER GASTROINTESTINAL ENDOSCOPY     WISDOM TOOTH EXTRACTION      There were no vitals filed for this visit.   Subjective Assessment - 02/02/21 7124     Subjective Patient reports no pain at arrival to PT. Patient reports some soreness in  her brachioradialis region in each forearm from repetitive lifting at work and moving boxes. Patient reports compliance with her HEP. Patient reports no recent flare-ups of exacerbations of her low back condition.    Pertinent History Patient is a 55 year old female with primary complaint of low back pain. Patient reports she was given prednisone taper and was given muscle relaxers. She felt that mm relaxers did not help. Patient reports her pain began insidiously with no apparent injury/incident. Pt reports pain starting around L low back and down to L gluteal region; she has had some pain across and affecting R paraspinal too. Pt reports using salonpas  patch along the area and this helped significantly. Patient reports having some "pinching" or sensation of something "sticking in" the back. Pt denies paresthesias down lower extremities, though she has experienced this before. No disturbed sleep recently with use of Salonpas. (-) Red flag screen    Limitations Sitting;Lifting;Other (comment)   comfort with lying/sleeping   Diagnostic tests Radiographs taken prior to this recurrence of pain - degenerative changes in L-spine    Patient Stated Goals Able to get back into 5 mi/day walking regimen; decreased pain    Pain Onset More than a month ago              OBJECTIVE FINDINGS   AROM Lumbar flexion 75%  no pain Thoracolumbar rotation: R 100%, L 100%           TREATMENT     Manual Therapy - for symptom modulation, soft tissue sensitivity and mobility, joint mobility, ROM    STM/DTM T10-L2 R>L longissimus [minimal soft tissue sensitivity today]   CPA T3-T10 gr III for mobility, T10-L5 gr II for pain control     *not today* STM gluteus medius       Therapeutic Exercise - for improved soft tissue flexibility and extensibility as needed for ROM, improved strength as needed to improve performance of CKC activities/functional movements     Cat Camel; x10 ea dir Open book; x10 ea dir Prone alt hip extension; 1x15 alternating bilateral LE  Dying bug; 2x10 alternating [moderate verbal cueing and demonstration for technique] Bird dog; x10 alternating [heavy verbal and tactile cueing to avoid excessive truncal rotation and pelvic tilt] Bridge with neutral-spine position; 2x10  *next visit* Nautilus core walkout, with belt around waist; 70-lbs; x10; 3 directions (posterior, left, right)   *not today* Repeated extension in lying; 2x10       ASSESSMENT Patient has notably improved R rotation ROM today and exhibits only mild motion loss in end-range lumbar flexion. She has tolerated recent physically demanding work duties well  without exacerbation of back pain. She is able to continue with progressive isometric exercise and gluteal strengthening without significant c/o pain. Patient has no notable thoracolumbar paraspinal sensitivity, but she does have fleeting discomfort with CPA in vicinity of thoracolumbar junction. Patient has remaining deficits in mild thoracolumbar AROM loss, hip strength, thoracic spine mobility, and intermittent thoracolumbar pain with variable laterality. Patient will benefit from continued skilled therapeutic intervention to address the above deficits as needed for improved function and QoL.       PT Short Term Goals - 01/02/21 1021       PT SHORT TERM GOAL #1   Title Pt will be independent and 100% compliant with established HEP and activity modification as needed to augment PT intervention and prevent flare-up of back pain as needed for best return to prior level of function.    Baseline 01/01/21:  HEP initiated, activity modification discussed    Time 3    Period Weeks    Status New    Target Date 01/22/21      PT SHORT TERM GOAL #2   Title Patient will have full thoracolumbar AROM without reproduction of pain as needed for functional reaching, self-care ADLs, bending, household chores.    Baseline 01/01/21: Pain and motion loss with flexion, motion loss c extension, pain with R lateral flexion, motion loss c bilat thoracolumbar rotation.    Time 4    Period Weeks    Status New    Target Date 01/29/21               PT Long Term Goals - 02/02/21 0620       PT LONG TERM GOAL #1   Title Patient will demonstrate improved function as evidenced by reaching FOTO score of 74, indicative of full participation in activities at home and in the community.    Baseline 01/01/21: FOTO to be obtained next visit.   01/08/21: 56    Time 6    Period Weeks    Target Date 02/12/21      PT LONG TERM GOAL #2   Title Pt will decrease worst back pain as reported on NPRS by at least 2 points in order  to demonstrate clinically significant reduction in back pain.    Baseline 01/01/21: Pain 10/10 at worst    Time 6    Period Weeks    Status New    Target Date 02/12/21      PT LONG TERM GOAL #3   Title Patient will demonstrate box lift from floor to waist and box transfer from surface to surface simulating work duties with load usually managed in working environment with sound body mechanics and no reproduction of back pain    Baseline 01/01/21: Patient significantly limited in performance of lifting at this time   Target date put in instead of date for baseline in error.   Time 6    Period Weeks    Status New    Target Date 02/12/21                   Plan - 02/02/21 0618     Clinical Impression Statement Patient has notably improved R rotation ROM today and exhibits only mild motion loss in end-range lumbar flexion. She has tolerated recent physically demanding work duties well without exacerbation of back pain. She is able to continue with progressive isometric exercise and gluteal strengthening without significant c/o pain. Patient has no notable thoracolumbar paraspinal sensitivity, but she does have fleeting discomfort with CPA in vicinity of thoracolumbar junction. Patient has remaining deficits in mild thoracolumbar AROM loss, hip strength, thoracic spine mobility, and intermittent thoracolumbar pain with variable laterality. Patient will benefit from continued skilled therapeutic intervention to address the above deficits as needed for improved function and QoL.    Personal Factors and Comorbidities Comorbidity 3+;Profession    Comorbidities GERD, Barret's esophagus, high cholesterol, leukopenia    Examination-Activity Limitations Sit;Lift;Squat;Sleep;Carry;Bend;Bed Mobility    Examination-Participation Restrictions Community Activity;Yard Work;Occupation    Stability/Clinical Decision Making Evolving/Moderate complexity    Rehab Potential Good    Clinical Impairments  Affecting Rehab Potential Positive: Age. Motivation. Active lifestyle. Negative: recurrent episode of low back pain    PT Frequency 2x / week    PT Duration 6 weeks    PT Treatment/Interventions Electrical Stimulation;Therapeutic activities;Therapeutic exercise;Neuromuscular re-education;Manual techniques;Patient/family education;Dry needling;Spinal Manipulations  PT Next Visit Plan MDT extension principle with modification or force progression prn, manual therapy to address mobility deficits or soft tissue sensitivity, graded movement and progressive strengthening as able    PT Home Exercise Plan Access Code [email or web address]    Consulted and Agree with Plan of Care Patient             Patient will benefit from skilled therapeutic intervention in order to improve the following deficits and impairments:  Decreased range of motion, Decreased strength, Pain, Impaired flexibility  Visit Diagnosis: Bilateral low back pain without sciatica, unspecified chronicity  Muscle weakness (generalized)     Problem List Patient Active Problem List   Diagnosis Date Noted   Plantar fasciitis 01/05/2021   Hyperglycemia 07/23/2020   Sciatica 02/23/2020   Tinnitus 12/02/2019   Belching    Globus sensation    Hot flashes 07/01/2019   Loss of taste 02/05/2019   Headache 08/28/2018   Exposure to COVID-19 virus 08/24/2018   Chest pain 10/30/2017   Witnessed apneic spells 10/31/2016   Other neutropenia ([place]) 08/10/2016   Vertigo 07/28/2016   Health care maintenance 11/21/2014   History of colonic polyps 11/21/2014   Dysphagia 11/19/2014   Trochanteric bursitis of left hip 10/08/2014   URI (upper respiratory infection) 02/10/2014   Pelvic pain in female 12/25/2013   Personal history of ovarian cyst 12/25/2013   Change in bowel movement 11/11/2013   breast tenderness 09/29/2013   Dysphagia, unspecified(787.20) 06/12/2012   Barrett's esophagus 06/12/2012   Environmental allergies 02/13/2012    Enlarged thyroid 02/13/2012   GERD (gastroesophageal reflux disease) 02/08/2012   Leukopenia 02/08/2012   Fatigue 02/08/2012   Hypercholesterolemia 02/08/2012   *Addendum for carryover of "Plan" section from [place], PT, DPT #[id number]  Eilleen Kempf, PT 02/02/2021, 6:23 AM  [facility] [address] [place], [place], [address] Phone: [phone]   Fax:  [phone]  Name: Kelly Massey MRN: [id number] Date of Birth: 04-Jan-1966

## 2021-02-01 ENCOUNTER — Other Ambulatory Visit: Payer: Self-pay | Admitting: Gastroenterology

## 2021-02-03 ENCOUNTER — Other Ambulatory Visit: Payer: Self-pay

## 2021-02-03 ENCOUNTER — Ambulatory Visit: Payer: 59 | Attending: Podiatry | Admitting: Physical Therapy

## 2021-02-03 DIAGNOSIS — M545 Low back pain, unspecified: Secondary | ICD-10-CM | POA: Diagnosis not present

## 2021-02-03 DIAGNOSIS — M6281 Muscle weakness (generalized): Secondary | ICD-10-CM | POA: Insufficient documentation

## 2021-02-03 NOTE — Therapy (Signed)
[facility] [address] [place], [place], [address] Phone: [phone]   Fax:  [phone]  Physical Therapy Treatment/ Physical Therapy Progress Note   Dates of reporting period  01/01/21   to   02/03/21   Patient Details  Name: Kelly Massey MRN: [id number] Date of Birth: 1965-12-28 Referring Provider (PT): Levy Pupa, [place]   Encounter Date: 02/03/2021   PT End of Session - 02/03/21 1653     Visit Number 6    Number of Visits 13    Date for PT Re-Evaluation 02/24/21    Authorization Type [employer] 60 combined PT/OT/Speech visits per year - 6 used prior to this episode of care    Authorization Time Period prog not 05/11/83, cert 2/77-82/42    Progress Note Due on Visit 10    PT Start Time 3536    PT Stop Time 1733    PT Time Calculation (min) 43 min    Activity Tolerance Patient tolerated treatment well;No increased pain    Behavior During Therapy [facility] for tasks Massey             Past Medical History:  Diagnosis Date   Allergy    Anemia    Arthritis    knees - no meds   Colon polyps    HPP_ 2012   GERD (gastroesophageal reflux disease) 10/12/2010   EGD, positive H. pylori   HSV infection    History   Hyperlipidemia    ? no meds - diet controlled   Post-operative nausea and vomiting    Seasonal allergies     Past Surgical History:  Procedure Laterality Date   61 HOUR [place] STUDY N/A 07/25/2019   Procedure: 24 HOUR [place] STUDY;  Surgeon: Lavena Bullion, DO;  Location: [facility];  Service: Gastroenterology;  Laterality: N/A;   ABDOMINAL HYSTERECTOMY     BALLOON DILATION N/A 07/24/2012   Procedure: BALLOON DILATION;  Surgeon: Inda Castle, MD;  Location: [facility];  Service: Endoscopy;  Laterality: N/A;   BRAVO [place] STUDY N/A 07/24/2012   Procedure: BRAVO [place] STUDY;  Surgeon: Inda Castle, MD;  Location: [facility];  Service: Endoscopy;  Laterality: N/A;   COLONOSCOPY     DILATION AND  CURETTAGE OF UTERUS     SAB   ESOPHAGEAL MANOMETRY N/A 07/25/2019   Procedure: ESOPHAGEAL MANOMETRY (EM);  Surgeon: Lavena Bullion, DO;  Location: [facility];  Service: Gastroenterology;  Laterality: N/A;   ESOPHAGOGASTRODUODENOSCOPY N/A 07/24/2012   Procedure: ESOPHAGOGASTRODUODENOSCOPY (EGD);  Surgeon: Inda Castle, MD;  Location: [facility];  Service: Endoscopy;  Laterality: N/A;   OVARIAN CYST REMOVAL  2004   laparotomy -left   ROBOTIC ASSISTED LAPAROSCOPIC LYSIS OF ADHESION N/A 03/21/2014   Procedure: ROBOTIC ASSISTED LAPAROSCOPIC EXTENSIVE LYSIS OF ADHESION (1 Hour);  Surgeon: Marvene Staff, MD;  Location: [facility];  Service: Gynecology;  Laterality: N/A;   ROBOTIC ASSISTED SALPINGO OOPHERECTOMY Left 03/21/2014   Procedure:  ROBOTIC ASSISTED LEFT OOPHORECTOMY;  Surgeon: Marvene Staff, MD;  Location: [facility];  Service: Gynecology;  Laterality: Left;   rotator cuff surgery  2017   SHOULDER ARTHROSCOPY Left    TENNIS ELBOW RELEASE/NIRSCHEL PROCEDURE Right 05/01/2015   Procedure: RIGHT ELBOW DEBRIDEMENT AND TENDON REPAIR;  Surgeon: [name], MD;  Location: [facility];  Service: Orthopedics;  Laterality: Right;   UPPER GASTROINTESTINAL ENDOSCOPY     WISDOM TOOTH EXTRACTION      There were no  vitals filed for this visit.   Subjective Assessment - 02/03/21 1654     Subjective Pt reports feeling relatively well at arrival to PT. Patient reports feeling more pinching along her hips during the day. Patient reports lateral hip pain on each side. No gluteal pain. Patient reports no radiating or referred symptoms to posterior thigh/leg on either side. Patient reports her back has not bothered her ast much recently. She reports some pain along greater trochanter region on either side with sidelying. Patient reports doing well with household cleaning. 80% SANE score at this time. Pt has some days that are pain-free. Patient reports no notable back pain, 6/10  pain in her hips at this time. Patient reports pain in low back no more than 3/10 over the last week.    Pertinent History Patient is a 55 year old female with primary complaint of low back pain. Patient reports she was given prednisone taper and was given muscle relaxers. She felt that mm relaxers did not help. Patient reports her pain began insidiously with no apparent injury/incident. Pt reports pain starting around L low back and down to L gluteal region; she has had some pain across and affecting R paraspinal too. Pt reports using salonpas patch along the area and this helped significantly. Patient reports having some "pinching" or sensation of something "sticking in" the back. Pt denies paresthesias down lower extremities, though she has experienced this before. No disturbed sleep recently with use of Salonpas. (-) Red flag screen    Limitations Sitting;Lifting;Other (comment)   comfort with lying/sleeping   Diagnostic tests Radiographs taken prior to this recurrence of pain - degenerative changes in L-spine    Patient Stated Goals Able to get back into 5 mi/day walking regimen; decreased pain    Pain Onset More than a month ago               OBJECTIVE FINDINGS   Palpation Tenderness to palpation along bilateral piriformis/gemelli, R>L gluteus medius      Strength (out of 5) R/L 5/5 Hip flexion (No pain today) 4+/5 Hip ER 4+/4+ Hip IR  5/5 Hip adduction 4/4 Hip extension 5/5 Knee extension 5/5 Knee flexion 5/5 Ankle dorsiflexion 5/5 Ankle plantarflexion *Indicates pain   AROM (degrees) R/L (all movements include overpressure unless otherwise stated) Lumbar forward flexion (65): 75% Lumbar extension (30):75% Lumbar lateral flexion (25): R: 100%  L: 100% Thoracic and Lumbar rotation (30 degrees):  R: 100% L: 100% Hip IR (0-45): R: WNL L: WNL Hip ER (0-45): R: WNL L: WNL Hip Flexion (0-125): R: WNL L: WNL *Indicates pain   PROM Hip flexion: R WNL, L WNL* ER: R WNL, L  WNL IR: R WNL*, L WNL* ABD WNL bilat [Pain with overpressure in Right IR, Left flexion and IR]   Muscle Length Hamstrings: R: 15 degrees L: 15 degrees  [place]: R 75%, L 75% Thomas: not tested   Passive Accessory Intervertebral Motion (PAIVM) Pt denies reproduction of back pain with CPA L3-L5; reproduction of pain with CPA T10-L2 Hypomobile throughout T3-T10 and at lumbosacral junction.       Special Tests FABER: R Negative, L Negative Scour: R Positive, L Positive       TREATMENT    Therapeutic Activities  Re-assessment performed (see above)    Manual Therapy - for symptom modulation, soft tissue sensitivity and mobility, joint mobility, ROM    STM/DTM T10-L2 R>L longissimus [minimal soft tissue sensitivity today], IASTM with hypervolt along bilateral gluteus medius   CPA  T3-T10 gr III for mobility, T10-L5 gr II for pain control     *not today* STM gluteus medius       Therapeutic Exercise - for improved soft tissue flexibility and extensibility as needed for ROM, improved strength as needed to improve performance of CKC activities/functional movements   Piriformis stretch; x30 sec bilateral  Pt edu: HEP update and review    *next visit* Bridge with neutral-spine position; 2x10 Nautilus core walkout, with belt around waist; 70-lbs; x10; 3 directions (posterior, left, right) Bird dog; x10 alternating [heavy verbal and tactile cueing to avoid excessive truncal rotation and pelvic tilt]   *not today* Cat Camel; x10 ea dir Open book; x10 ea dir Prone alt hip extension; 1x15 alternating bilateral LE  Dying bug; 2x10 alternating [moderate verbal cueing and demonstration for technique] Repeated extension in lying; 2x10       ASSESSMENT Patient is doing well with maintaining extension program. She has notably improved in regard to lumbosacral pain and axial thoracolumbar pain. Patient has made significant progress c FOTO, tolerance of accessing full thoracolumbar  ROM, and numeric pain rating scale. Patient denies recent flare-up of back pain with physical work duties. She has recent complaint of lateral hip pain that has bothered her during standing activity during the day and while sidelying on either side. Patient does not have significant tenderness to palpation at greater trochanter on either side. Pt has positive Scour and tenderness to palpation along gluteus medius and deep hip ER complex on either side. Updated HEP for piriformis stretching. Pt has made excellent progress to date with impairments related to LBP; will continue to monitor for lateral hip pain with successive visits. Patient has remaining deficits in mild thoracolumbar AROM loss, hip strength, thoracic spine mobility, and intermittent thoracolumbar pain with variable laterality. Patient will benefit from continued skilled therapeutic intervention to address the above deficits as needed for improved function and QoL.     PT Short Term Goals - 02/03/21 1734       PT SHORT TERM GOAL #1   Title Pt will be independent and 100% compliant with established HEP and activity modification as needed to augment PT intervention and prevent flare-up of back pain as needed for best return to prior level of function.    Baseline 01/01/21: HEP initiated, activity modification discussed.   02/03/21: pt is compliant with HEP and IND with sound technique    Time 3    Period Weeks    Status Achieved    Target Date 01/22/21      PT SHORT TERM GOAL #2   Title Patient will have full thoracolumbar AROM without reproduction of pain as needed for functional reaching, self-care ADLs, bending, household chores.    Baseline 01/01/21: Pain and motion loss with flexion, motion loss c extension, pain with R lateral flexion, motion loss c bilat thoracolumbar rotation.    02/03/21: mild motion loss with flexion and extension, no pain    Time 4    Period Weeks    Status Partially Met    Target Date 02/24/21                PT Long Term Goals - 02/03/21 1750       PT LONG TERM GOAL #1   Title Patient will demonstrate improved function as evidenced by reaching FOTO score of 74, indicative of full participation in activities at home and in the community.    Baseline 01/01/21: FOTO to be obtained next visit.  01/08/21: 56   02/03/21: 72    Time 6    Period Weeks    Status Partially Met    Target Date 03/03/21      PT LONG TERM GOAL #2   Title Pt will decrease worst back pain as reported on NPRS by at least 2 points in order to demonstrate clinically significant reduction in back pain.    Baseline 01/01/21: Pain 10/10 at worst   02/03/21: Back pain 3/10 at worst    Time 6    Period Weeks    Status Achieved    Target Date 02/12/21      PT LONG TERM GOAL #3   Title Patient will demonstrate box lift from floor to waist and box transfer from surface to surface simulating work duties with load usually managed in working environment with sound body mechanics and no reproduction of back pain    Baseline 01/01/21: Patient significantly limited in performance of lifting at this time.   02/03/21: Deferred   Target date put in instead of date for baseline in error.   Time 6    Period Weeks    Status Deferred    Target Date 03/03/21                   Plan - 02/04/21 2218     Clinical Impression Statement Patient is doing well with maintaining extension program. She has notably improved in regard to lumbosacral pain and axial thoracolumbar pain. Patient has made significant progress c FOTO, tolerance of accessing full thoracolumbar ROM, and numeric pain rating scale. Patient denies recent flare-up of back pain with physical work duties. She has recent complaint of lateral hip pain that has bothered her during standing activity during the day and while sidelying on either side. Patient does not have significant tenderness to palpation at greater trochanter on either side. Pt has positive Scour and tenderness to  palpation along gluteus medius and deep hip ER complex on either side. Updated HEP for piriformis stretching. Pt has made excellent progress to date with impairments related to LBP; will continue to monitor for lateral hip pain with successive visits. Patient has remaining deficits in mild thoracolumbar AROM loss, hip strength, thoracic spine mobility, and intermittent thoracolumbar pain with variable laterality. Patient will benefit from continued skilled therapeutic intervention to address the above deficits as needed for improved function and QoL.    Personal Factors and Comorbidities Comorbidity 3+;Profession    Comorbidities GERD, Barret's esophagus, high cholesterol, leukopenia    Examination-Activity Limitations Sit;Lift;Squat;Sleep;Carry;Bend;Bed Mobility    Examination-Participation Restrictions Community Activity;Yard Work;Occupation    Stability/Clinical Decision Making Evolving/Moderate complexity    Rehab Potential Good    Clinical Impairments Affecting Rehab Potential Positive: Age. Motivation. Active lifestyle. Negative: recurrent episode of low back pain    PT Frequency 2x / week    PT Duration 6 weeks    PT Treatment/Interventions Electrical Stimulation;Therapeutic activities;Therapeutic exercise;Neuromuscular re-education;Manual techniques;Patient/family education;Dry needling;Spinal Manipulations    PT Next Visit Plan MDT extension principle with modification or force progression prn, manual therapy to address mobility deficits or soft tissue sensitivity, graded movement and progressive strengthening as able. Recommend continued PT 2x/week for 3 weeks    PT Home Exercise Plan Access Code [email or web address]    Consulted and Agree with Plan of Care Patient             Patient will benefit from skilled therapeutic intervention in order to improve the following deficits and impairments:  Decreased range  of motion, Decreased strength, Pain, Impaired flexibility  Visit Diagnosis: Bilateral  low back pain without sciatica, unspecified chronicity  Muscle weakness (generalized)     Problem List Patient Active Problem List   Diagnosis Date Noted   Plantar fasciitis 01/05/2021   Hyperglycemia 07/23/2020   Sciatica 02/23/2020   Tinnitus 12/02/2019   Belching    Globus sensation    Hot flashes 07/01/2019   Loss of taste 02/05/2019   Headache 08/28/2018   Exposure to COVID-19 virus 08/24/2018   Chest pain 10/30/2017   Witnessed apneic spells 10/31/2016   Other neutropenia ([place]) 08/10/2016   Vertigo 07/28/2016   Health care maintenance 11/21/2014   History of colonic polyps 11/21/2014   Dysphagia 11/19/2014   Trochanteric bursitis of left hip 10/08/2014   URI (upper respiratory infection) 02/10/2014   Pelvic pain in female 12/25/2013   Personal history of ovarian cyst 12/25/2013   Change in bowel movement 11/11/2013   breast tenderness 09/29/2013   Dysphagia, unspecified(787.20) 06/12/2012   Barrett's esophagus 06/12/2012   Environmental allergies 02/13/2012   Enlarged thyroid 02/13/2012   GERD (gastroesophageal reflux disease) 02/08/2012   Leukopenia 02/08/2012   Fatigue 02/08/2012   Hypercholesterolemia 02/08/2012   Kelly Massey, PT, DPT #[id number]  Kelly Massey, PT 02/04/2021, 10:21 PM  [facility] [address] [place], [place], [address] Phone: [phone]   Fax:  [phone]  Name: Kelly Massey MRN: [id number] Date of Birth: 12/03/1965

## 2021-02-04 ENCOUNTER — Encounter: Payer: Self-pay | Admitting: Physical Therapy

## 2021-02-05 ENCOUNTER — Encounter: Payer: 59 | Admitting: Physical Therapy

## 2021-02-10 ENCOUNTER — Ambulatory Visit: Payer: 59 | Admitting: Physical Therapy

## 2021-02-10 ENCOUNTER — Encounter: Payer: Self-pay | Admitting: Physical Therapy

## 2021-02-10 ENCOUNTER — Other Ambulatory Visit: Payer: Self-pay

## 2021-02-10 DIAGNOSIS — M6281 Muscle weakness (generalized): Secondary | ICD-10-CM

## 2021-02-10 DIAGNOSIS — M545 Low back pain, unspecified: Secondary | ICD-10-CM

## 2021-02-10 NOTE — Therapy (Signed)
[place] [facility] [address] [place], [place], [address] Phone: [phone]   Fax:  [phone]  Physical Therapy Treatment  Patient Details  Name: Kelly Massey MRN: [id number] Date of Birth: 1965/11/18 Referring Provider (PT): Levy Pupa, [place]   Encounter Date: 02/10/2021   PT End of Session - 02/12/21 0959     Visit Number 7    Number of Visits 13    Date for PT Re-Evaluation 02/24/21    Authorization Type [employer] 60 combined PT/OT/Speech visits per year - 6 used prior to this episode of care    Authorization Time Period prog not 53/2/99, cert 2/42-68/34    Progress Note Due on Visit 10    PT Start Time 1962    PT Stop Time 1728    PT Time Calculation (min) 45 min    Activity Tolerance Patient tolerated treatment well;No increased pain    Behavior During Therapy [facility] for tasks assessed/performed              Past Medical History:  Diagnosis Date   Allergy    Anemia    Arthritis    knees - no meds   Colon polyps    HPP_ 2012   GERD (gastroesophageal reflux disease) 10/12/2010   EGD, positive H. pylori   HSV infection    History   Hyperlipidemia    ? no meds - diet controlled   Post-operative nausea and vomiting    Seasonal allergies     Past Surgical History:  Procedure Laterality Date   30 HOUR [place] STUDY N/A 07/25/2019   Procedure: 24 HOUR [place] STUDY;  Surgeon: Lavena Bullion, DO;  Location: [facility];  Service: Gastroenterology;  Laterality: N/A;   ABDOMINAL HYSTERECTOMY     BALLOON DILATION N/A 07/24/2012   Procedure: BALLOON DILATION;  Surgeon: Inda Castle, MD;  Location: [facility];  Service: Endoscopy;  Laterality: N/A;   BRAVO [place] STUDY N/A 07/24/2012   Procedure: BRAVO [place] STUDY;  Surgeon: Inda Castle, MD;  Location: [facility];  Service: Endoscopy;  Laterality: N/A;   COLONOSCOPY     DILATION AND CURETTAGE OF UTERUS     SAB   ESOPHAGEAL MANOMETRY N/A 07/25/2019   Procedure: ESOPHAGEAL  MANOMETRY (EM);  Surgeon: Lavena Bullion, DO;  Location: [facility];  Service: Gastroenterology;  Laterality: N/A;   ESOPHAGOGASTRODUODENOSCOPY N/A 07/24/2012   Procedure: ESOPHAGOGASTRODUODENOSCOPY (EGD);  Surgeon: Inda Castle, MD;  Location: [facility];  Service: Endoscopy;  Laterality: N/A;   OVARIAN CYST REMOVAL  2004   laparotomy -left   ROBOTIC ASSISTED LAPAROSCOPIC LYSIS OF ADHESION N/A 03/21/2014   Procedure: ROBOTIC ASSISTED LAPAROSCOPIC EXTENSIVE LYSIS OF ADHESION (1 Hour);  Surgeon: Marvene Staff, MD;  Location: [facility];  Service: Gynecology;  Laterality: N/A;   ROBOTIC ASSISTED SALPINGO OOPHERECTOMY Left 03/21/2014   Procedure:  ROBOTIC ASSISTED LEFT OOPHORECTOMY;  Surgeon: Marvene Staff, MD;  Location: [facility];  Service: Gynecology;  Laterality: Left;   rotator cuff surgery  2017   SHOULDER ARTHROSCOPY Left    TENNIS ELBOW RELEASE/NIRSCHEL PROCEDURE Right 05/01/2015   Procedure: RIGHT ELBOW DEBRIDEMENT AND TENDON REPAIR;  Surgeon: [name], MD;  Location: [facility];  Service: Orthopedics;  Laterality: Right;   UPPER GASTROINTESTINAL ENDOSCOPY     WISDOM TOOTH EXTRACTION      There were no vitals filed for this visit.   Subjective Assessment - 02/12/21 1000     Subjective Patient  reports ongoing irritation affecting her lateral hips at night. 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 Patient reports no major pain at arrival to PT. Patient reports minimal low back pain during the day. She states it does feel "tender" over trochanteric region bilaterally.    Pertinent History Patient is a 55 year old female with primary complaint of low back pain. Patient reports she was given prednisone taper and was given muscle relaxers. She felt that mm relaxers did not help. Patient reports her pain began insidiously with no apparent injury/incident. Pt reports pain starting around L low back and down to L gluteal region; she has had  some pain across and affecting R paraspinal too. Pt reports using salonpas patch along the area and this helped significantly. Patient reports having some "pinching" or sensation of something "sticking in" the back. Pt denies paresthesias down lower extremities, though she has experienced this before. No disturbed sleep recently with use of Salonpas. (-) Red flag screen    Limitations Sitting;Lifting;Other (comment)   comfort with lying/sleeping   Diagnostic tests Radiographs taken prior to this recurrence of pain - degenerative changes in L-spine    Patient Stated Goals Able to get back into 5 mi/day walking regimen; decreased pain    Pain Onset More than a month ago              MMT Hip abduction: R 4/5*,  L 4/5* *Indicates pain    TREATMENT      Manual Therapy - for symptom modulation, soft tissue sensitivity and mobility, joint mobility, ROM    STM/DTM T10-L2 R>L longissimus [minimal soft tissue sensitivity today], IASTM with hypervolt along bilateral gluteus medius   CPA T3-T10 gr III for mobility, T10-L5 gr II for pain control     *not today* STM gluteus medius       Therapeutic Exercise - for improved soft tissue flexibility and extensibility as needed for ROM, improved strength as needed to improve performance of CKC activities/functional movements  Open book; x10 ea dir   Piriformis stretch; 3x30 sec bilateral  Lower trunk rotations with QL bias (figure-4 position in hooklying), x10 R and L    *next visit* Sidelying hip Abduction  Bridge with neutral-spine position; 2x10 Nautilus core walkout, with belt around waist; 70-lbs; x10; 3 directions (posterior, left, right) Bird dog; x10 alternating [heavy verbal and tactile cueing to avoid excessive truncal rotation and pelvic tilt]     *not today* Cat Camel; x10 ea dir Prone alt hip extension; 1x15 alternating bilateral LE  Dying bug; 2x10 alternating [moderate verbal cueing and demonstration for  technique] Repeated extension in lying; 2x10     Cold pack (unbilled) - for anti-inflammatory and analgesic effect as needed for reduced pain and improved ability to participate in active PT intervention, along R hip in L sidelying, x 5 minutes      ASSESSMENT Patient has ongoing pain in the evening and when sidelying affecting lateral hips on either side, most notably on R side today. She reports no pain in lumbosacral region or lower limb referred pain/sciatic-type symptoms. Pt has no significant tenderness along greater trochanter on either side, but she does have gluteus medius weakness and mild onset of pain with hip abduction MMT. Pt does have relief with use of cold pack this evening. Pt has made good progress to date in regard to low back pain; lateral hip pain does not seem consistent with lumbar spine referred pain. Will continue to monitor for lateral hip pain with  successive visits. Patient has remaining deficits in mild thoracolumbar AROM loss, hip strength, thoracic spine mobility, and intermittent thoracolumbar pain with variable laterality. Patient will benefit from continued skilled therapeutic intervention to address the above deficits as needed for improved function and QoL.     PT Short Term Goals - 02/03/21 1734       PT SHORT TERM GOAL #1   Title Pt will be independent and 100% compliant with established HEP and activity modification as needed to augment PT intervention and prevent flare-up of back pain as needed for best return to prior level of function.    Baseline 01/01/21: HEP initiated, activity modification discussed.   02/03/21: pt is compliant with HEP and IND with sound technique    Time 3    Period Weeks    Status Achieved    Target Date 01/22/21      PT SHORT TERM GOAL #2   Title Patient will have full thoracolumbar AROM without reproduction of pain as needed for functional reaching, self-care ADLs, bending, household chores.    Baseline 01/01/21: Pain and  motion loss with flexion, motion loss c extension, pain with R lateral flexion, motion loss c bilat thoracolumbar rotation.    02/03/21: mild motion loss with flexion and extension, no pain    Time 4    Period Weeks    Status Partially Met    Target Date 02/24/21               PT Long Term Goals - 02/03/21 1750       PT LONG TERM GOAL #1   Title Patient will demonstrate improved function as evidenced by reaching FOTO score of 74, indicative of full participation in activities at home and in the community.    Baseline 01/01/21: FOTO to be obtained next visit.   01/08/21: 56   02/03/21: 72    Time 6    Period Weeks    Status Partially Met    Target Date 03/03/21      PT LONG TERM GOAL #2   Title Pt will decrease worst back pain as reported on NPRS by at least 2 points in order to demonstrate clinically significant reduction in back pain.    Baseline 01/01/21: Pain 10/10 at worst   02/03/21: Back pain 3/10 at worst    Time 6    Period Weeks    Status Achieved    Target Date 02/12/21      PT LONG TERM GOAL #3   Title Patient will demonstrate box lift from floor to waist and box transfer from surface to surface simulating work duties with load usually managed in working environment with sound body mechanics and no reproduction of back pain    Baseline 01/01/21: Patient significantly limited in performance of lifting at this time.   02/03/21: Deferred   Target date put in instead of date for baseline in error.   Time 6    Period Weeks    Status Deferred    Target Date 03/03/21                   Plan - 02/12/21 0959     Clinical Impression Statement Patient has ongoing pain in the evening and when sidelying affecting lateral hips on either side, most notably on R side today. She reports no pain in lumbosacral region or lower limb referred pain/sciatic-type symptoms. Pt has no significant tenderness along greater trochanter on either side, but she does have gluteus  medius  weakness and mild onset of pain with hip abduction MMT. Pt does have relief with use of cold pack this evening. Pt has made good progress to date in regard to low back pain; lateral hip pain does not seem consistent with lumbar spine referred pain. Will continue to monitor for lateral hip pain with successive visits. Patient has remaining deficits in mild thoracolumbar AROM loss, hip strength, thoracic spine mobility, and intermittent thoracolumbar pain with variable laterality. Patient will benefit from continued skilled therapeutic intervention to address the above deficits as needed for improved function and QoL.    Personal Factors and Comorbidities Comorbidity 3+;Profession    Comorbidities GERD, Barret's esophagus, high cholesterol, leukopenia    Examination-Activity Limitations Sit;Lift;Squat;Sleep;Carry;Bend;Bed Mobility    Examination-Participation Restrictions Community Activity;Yard Work;Occupation    Stability/Clinical Decision Making Evolving/Moderate complexity    Rehab Potential Good    Clinical Impairments Affecting Rehab Potential Positive: Age. Motivation. Active lifestyle. Negative: recurrent episode of low back pain    PT Frequency 2x / week    PT Duration 6 weeks    PT Treatment/Interventions Electrical Stimulation;Therapeutic activities;Therapeutic exercise;Neuromuscular re-education;Manual techniques;Patient/family education;Dry needling;Spinal Manipulations    PT Next Visit Plan MDT extension principle with modification or force progression prn, manual therapy to address mobility deficits or soft tissue sensitivity, graded movement and progressive strengthening as able. Recommend continued PT 2x/week for 3 weeks    PT Home Exercise Plan Access Code [email or web address]    Consulted and Agree with Plan of Care Patient             Patient will benefit from skilled therapeutic intervention in order to improve the following deficits and impairments:  Decreased range of motion, Decreased  strength, Pain, Impaired flexibility  Visit Diagnosis: Bilateral low back pain without sciatica, unspecified chronicity  Muscle weakness (generalized)     Problem List Patient Active Problem List   Diagnosis Date Noted   Plantar fasciitis 01/05/2021   Hyperglycemia 07/23/2020   Sciatica 02/23/2020   Tinnitus 12/02/2019   Belching    Globus sensation    Hot flashes 07/01/2019   Loss of taste 02/05/2019   Headache 08/28/2018   Exposure to COVID-19 virus 08/24/2018   Chest pain 10/30/2017   Witnessed apneic spells 10/31/2016   Other neutropenia ([place]) 08/10/2016   Vertigo 07/28/2016   Health care maintenance 11/21/2014   History of colonic polyps 11/21/2014   Dysphagia 11/19/2014   Trochanteric bursitis of left hip 10/08/2014   URI (upper respiratory infection) 02/10/2014   Pelvic pain in female 12/25/2013   Personal history of ovarian cyst 12/25/2013   Change in bowel movement 11/11/2013   breast tenderness 09/29/2013   Dysphagia, unspecified(787.20) 06/12/2012   Barrett's esophagus 06/12/2012   Environmental allergies 02/13/2012   Enlarged thyroid 02/13/2012   GERD (gastroesophageal reflux disease) 02/08/2012   Leukopenia 02/08/2012   Fatigue 02/08/2012   Hypercholesterolemia 02/08/2012   Valentina Gu, PT, DPT #[id number]  Eilleen Kempf, PT 02/12/2021, 10:00 AM  [facility] [address] [place], [place], [address] Phone: [phone]   Fax:  [phone]  Name: Kelly Massey MRN: [id number] Date of Birth: March 05, 1966

## 2021-02-12 ENCOUNTER — Encounter: Payer: 59 | Admitting: Physical Therapy

## 2021-02-17 ENCOUNTER — Other Ambulatory Visit: Payer: Self-pay

## 2021-02-17 ENCOUNTER — Ambulatory Visit: Payer: 59 | Admitting: Physical Therapy

## 2021-02-17 DIAGNOSIS — M545 Low back pain, unspecified: Secondary | ICD-10-CM | POA: Diagnosis not present

## 2021-02-17 DIAGNOSIS — M6281 Muscle weakness (generalized): Secondary | ICD-10-CM

## 2021-02-17 NOTE — Therapy (Signed)
[facility] [address] [place], [place], [address] Phone: [phone]   Fax:  [phone]  Physical Therapy Treatment  Patient Details  Name: Kelly Massey MRN: [id number] Date of Birth: September 07, 1965 Referring Provider (PT): Levy Pupa, [place]   Encounter Date: 02/17/2021   PT End of Session - 02/19/21 0934     Visit Number 8    Number of Visits 13    Date for PT Re-Evaluation 02/24/21    Authorization Type [employer] 60 combined PT/OT/Speech visits per year - 6 used prior to this episode of care    Authorization Time Period prog not 02/12/40, cert 7/40-81/44    Progress Note Due on Visit 16    PT Start Time 1640    PT Stop Time 1730    PT Time Calculation (min) 50 min    Activity Tolerance Patient tolerated treatment well;No increased pain    Behavior During Therapy [facility] for tasks assessed/performed              Past Medical History:  Diagnosis Date   Allergy    Anemia    Arthritis    knees - no meds   Colon polyps    HPP_ 2012   GERD (gastroesophageal reflux disease) 10/12/2010   EGD, positive H. pylori   HSV infection    History   Hyperlipidemia    ? no meds - diet controlled   Post-operative nausea and vomiting    Seasonal allergies     Past Surgical History:  Procedure Laterality Date   52 HOUR [place] STUDY N/A 07/25/2019   Procedure: 24 HOUR [place] STUDY;  Surgeon: Lavena Bullion, DO;  Location: [facility];  Service: Gastroenterology;  Laterality: N/A;   ABDOMINAL HYSTERECTOMY     BALLOON DILATION N/A 07/24/2012   Procedure: BALLOON DILATION;  Surgeon: Inda Castle, MD;  Location: [facility];  Service: Endoscopy;  Laterality: N/A;   BRAVO [place] STUDY N/A 07/24/2012   Procedure: BRAVO [place] STUDY;  Surgeon: Inda Castle, MD;  Location: [facility];  Service: Endoscopy;  Laterality: N/A;   COLONOSCOPY     DILATION AND CURETTAGE OF UTERUS     SAB   ESOPHAGEAL MANOMETRY N/A 07/25/2019   Procedure: ESOPHAGEAL  MANOMETRY (EM);  Surgeon: Lavena Bullion, DO;  Location: [facility];  Service: Gastroenterology;  Laterality: N/A;   ESOPHAGOGASTRODUODENOSCOPY N/A 07/24/2012   Procedure: ESOPHAGOGASTRODUODENOSCOPY (EGD);  Surgeon: Inda Castle, MD;  Location: [facility];  Service: Endoscopy;  Laterality: N/A;   OVARIAN CYST REMOVAL  2004   laparotomy -left   ROBOTIC ASSISTED LAPAROSCOPIC LYSIS OF ADHESION N/A 03/21/2014   Procedure: ROBOTIC ASSISTED LAPAROSCOPIC EXTENSIVE LYSIS OF ADHESION (1 Hour);  Surgeon: Marvene Staff, MD;  Location: [facility];  Service: Gynecology;  Laterality: N/A;   ROBOTIC ASSISTED SALPINGO OOPHERECTOMY Left 03/21/2014   Procedure:  ROBOTIC ASSISTED LEFT OOPHORECTOMY;  Surgeon: Marvene Staff, MD;  Location: [facility];  Service: Gynecology;  Laterality: Left;   rotator cuff surgery  2017   SHOULDER ARTHROSCOPY Left    TENNIS ELBOW RELEASE/NIRSCHEL PROCEDURE Right 05/01/2015   Procedure: RIGHT ELBOW DEBRIDEMENT AND TENDON REPAIR;  Surgeon: [name], MD;  Location: [facility];  Service: Orthopedics;  Laterality: Right;   UPPER GASTROINTESTINAL ENDOSCOPY     WISDOM TOOTH EXTRACTION      There were no vitals filed for this visit.   Subjective Assessment - 02/19/21 0934     Subjective Patient  reports R>L lateral hip pain along greater trochanteric region. She denies notable pain in lumbosacral region recently. Patient reports compliance with her HEP. She denies recent sciatic-type symptoms or paresthesias. Pt is compliant with HEP.    Pertinent History Patient is a 55 year old female with primary complaint of low back pain. Patient reports she was given prednisone taper and was given muscle relaxers. She felt that mm relaxers did not help. Patient reports her pain began insidiously with no apparent injury/incident. Pt reports pain starting around L low back and down to L gluteal region; she has had some pain across and affecting R paraspinal too. Pt  reports using salonpas patch along the area and this helped significantly. Patient reports having some "pinching" or sensation of something "sticking in" the back. Pt denies paresthesias down lower extremities, though she has experienced this before. No disturbed sleep recently with use of Salonpas. (-) Red flag screen    Limitations Sitting;Lifting;Other (comment)   comfort with lying/sleeping   Diagnostic tests Radiographs taken prior to this recurrence of pain - degenerative changes in L-spine    Patient Stated Goals Able to get back into 5 mi/day walking regimen; decreased pain    Pain Onset More than a month ago             OBJECTIVE FINDINGS  SLUMP: R Negative, L Negative SLR: R Negative, L Negative      TREATMENT     Manual Therapy - for symptom modulation, soft tissue sensitivity and mobility, joint mobility, ROM    STM/DTM T10-L2 R>L longissimus [minimal soft tissue sensitivity today], IASTM with hypervolt along bilateral gluteus medius   CPA T3-T10 gr III for mobility, T10-L5 gr II for pain control   R long-leg distraction for pain control, intermittent hold; 10 sec on, 10 sec off; 2 bouts of 5 minutes   *not today* STM gluteus medius       Therapeutic Exercise - for improved soft tissue flexibility and extensibility as needed for ROM, improved strength as needed to improve performance of CKC activities/functional movements   Open book; x10 ea dir    Piriformis stretch; 3x30 sec bilateral   Lower trunk rotations with QL bias (figure-4 position in hooklying), x10 R and L    Sidelying hip Abduction; x5, attempted in L sidelying (stopped due to increase in R gluteal and lateral thigh pain) Attempted regression to sidelying clamshell (pt has ongoing R gluteal pain and lateral thigh pain, referral to R fibular head without report of paresthesias)    *not today* Bridge with neutral-spine position; 2x10 Nautilus core walkout, with belt around waist; 70-lbs; x10; 3  directions (posterior, left, right) Bird dog; x10 alternating [heavy verbal and tactile cueing to avoid excessive truncal rotation and pelvic tilt]  Cat Camel; x10 ea dir Prone alt hip extension; 1x15 alternating bilateral LE  Dying bug; 2x10 alternating [moderate verbal cueing and demonstration for technique] Repeated extension in lying; 2x10       Cold pack (unbilled) - for anti-inflammatory and analgesic effect as needed for reduced pain and improved ability to participate in active PT intervention, along R hip in L sidelying, x 5 minutes [difficulty tolerating sidelying position today]       ASSESSMENT Patient has no notable pain affecting lumbosacral region at this time. She has ongoing R lateral hip pain (affecting L hip at lesser intensity) with no report of paresthesias or sciatica-type symptoms. She has increase in lateral hip pain with attempted gluteus medius isotonics in sidelying (poor  tolerance of sidelying hip abduction and clamshell). Attempted use of cold pack in L sidelying for R hip with pillow in between knees - pt has ongoing lateral thigh pain with referral down to R fibular head. This is mitigated with use of second round of R long-leg distraction. Pt has remaining pain with rolling on mat and reports continued R lateral hip discomfort at end of session. Discussed with pt holding on significant loading for this region temporarily, use of cryotherapy and/or thermotherapy as needed at home, and continued gentle stretching program. Patient has remaining deficits in mild thoracolumbar AROM loss, hip strength, thoracic spine mobility,  intermittent thoracolumbar pain with variable laterality, and R>L lateral hip pain with no pain provocation with direct palpation to greater trochanter/trochanteric bursa. Patient will benefit from continued skilled therapeutic intervention to address the above deficits as needed for improved function and QoL.        PT Short Term Goals - 02/03/21  1734       PT SHORT TERM GOAL #1   Title Pt will be independent and 100% compliant with established HEP and activity modification as needed to augment PT intervention and prevent flare-up of back pain as needed for best return to prior level of function.    Baseline 01/01/21: HEP initiated, activity modification discussed.   02/03/21: pt is compliant with HEP and IND with sound technique    Time 3    Period Weeks    Status Achieved    Target Date 01/22/21      PT SHORT TERM GOAL #2   Title Patient will have full thoracolumbar AROM without reproduction of pain as needed for functional reaching, self-care ADLs, bending, household chores.    Baseline 01/01/21: Pain and motion loss with flexion, motion loss c extension, pain with R lateral flexion, motion loss c bilat thoracolumbar rotation.    02/03/21: mild motion loss with flexion and extension, no pain    Time 4    Period Weeks    Status Partially Met    Target Date 02/24/21               PT Long Term Goals - 02/03/21 1750       PT LONG TERM GOAL #1   Title Patient will demonstrate improved function as evidenced by reaching FOTO score of 74, indicative of full participation in activities at home and in the community.    Baseline 01/01/21: FOTO to be obtained next visit.   01/08/21: 56   02/03/21: 72    Time 6    Period Weeks    Status Partially Met    Target Date 03/03/21      PT LONG TERM GOAL #2   Title Pt will decrease worst back pain as reported on NPRS by at least 2 points in order to demonstrate clinically significant reduction in back pain.    Baseline 01/01/21: Pain 10/10 at worst   02/03/21: Back pain 3/10 at worst    Time 6    Period Weeks    Status Achieved    Target Date 02/12/21      PT LONG TERM GOAL #3   Title Patient will demonstrate box lift from floor to waist and box transfer from surface to surface simulating work duties with load usually managed in working environment with sound body mechanics and no  reproduction of back pain    Baseline 01/01/21: Patient significantly limited in performance of lifting at this time.   02/03/21: Deferred  Target date put in instead of date for baseline in error.   Time 6    Period Weeks    Status Deferred    Target Date 03/03/21                   Plan - 02/19/21 1137     Clinical Impression Statement Patient has no notable pain affecting lumbosacral region at this time. She has ongoing R lateral hip pain (affecting L hip at lesser intensity) with no report of paresthesias or sciatica-type symptoms. She has increase in lateral hip pain with attempted gluteus medius isotonics in sidelying (poor tolerance of sidelying hip abduction and clamshell). Attempted use of cold pack in L sidelying for R hip with pillow in between knees - pt has ongoing lateral thigh pain with referral down to R fibular head. This is mitigated with use of second round of R long-leg distraction. Pt has remaining pain with rolling on mat and reports continued R lateral hip discomfort at end of session. Discussed with pt holding on significant loading for this region temporarily, use of cryotherapy and/or thermotherapy as needed at home, and continued gentle stretching program. Patient has remaining deficits in mild thoracolumbar AROM loss, hip strength, thoracic spine mobility,  intermittent thoracolumbar pain with variable laterality, and R>L lateral hip pain with no pain provocation with direct palpation to greater trochanter/trochanteric bursa. Patient will benefit from continued skilled therapeutic intervention to address the above deficits as needed for improved function and QoL.    Personal Factors and Comorbidities Comorbidity 3+;Profession    Comorbidities GERD, Barret's esophagus, high cholesterol, leukopenia    Examination-Activity Limitations Sit;Lift;Squat;Sleep;Carry;Bend;Bed Mobility    Examination-Participation Restrictions Community Activity;Yard Work;Occupation     Stability/Clinical Decision Making Evolving/Moderate complexity    Rehab Potential Good    Clinical Impairments Affecting Rehab Potential Positive: Age. Motivation. Active lifestyle. Negative: recurrent episode of low back pain    PT Frequency 2x / week    PT Duration 6 weeks    PT Treatment/Interventions Electrical Stimulation;Therapeutic activities;Therapeutic exercise;Neuromuscular re-education;Manual techniques;Patient/family education;Dry needling;Spinal Manipulations    PT Next Visit Plan MDT extension principle with modification or force progression prn, manual therapy to address mobility deficits or soft tissue sensitivity, graded movement and progressive strengthening as able. Recommend continued PT 2x/week for 3 weeks    PT Home Exercise Plan Access Code [email or web address]    Consulted and Agree with Plan of Care Patient             Patient will benefit from skilled therapeutic intervention in order to improve the following deficits and impairments:  Decreased range of motion, Decreased strength, Pain, Impaired flexibility  Visit Diagnosis: Bilateral low back pain without sciatica, unspecified chronicity  Muscle weakness (generalized)     Problem List Patient Active Problem List   Diagnosis Date Noted   Plantar fasciitis 01/05/2021   Hyperglycemia 07/23/2020   Sciatica 02/23/2020   Tinnitus 12/02/2019   Belching    Globus sensation    Hot flashes 07/01/2019   Loss of taste 02/05/2019   Headache 08/28/2018   Exposure to COVID-19 virus 08/24/2018   Chest pain 10/30/2017   Witnessed apneic spells 10/31/2016   Other neutropenia ([place]) 08/10/2016   Vertigo 07/28/2016   Health care maintenance 11/21/2014   History of colonic polyps 11/21/2014   Dysphagia 11/19/2014   Trochanteric bursitis of left hip 10/08/2014   URI (upper respiratory infection) 02/10/2014   Pelvic pain in female 12/25/2013   Personal history of ovarian cyst 12/25/2013  Change in bowel movement 11/11/2013    breast tenderness 09/29/2013   Dysphagia, unspecified(787.20) 06/12/2012   Barrett's esophagus 06/12/2012   Environmental allergies 02/13/2012   Enlarged thyroid 02/13/2012   GERD (gastroesophageal reflux disease) 02/08/2012   Leukopenia 02/08/2012   Fatigue 02/08/2012   Hypercholesterolemia 02/08/2012   Valentina Gu, PT, DPT #[id number]  Eilleen Kempf, PT 02/19/2021, 11:37 AM  [facility] [address] [place], [place], [address] Phone: [phone]   Fax:  [phone]  Name: Kelly Massey MRN: [id number] Date of Birth: 12-08-1965

## 2021-02-18 ENCOUNTER — Encounter: Payer: Self-pay | Admitting: Physical Therapy

## 2021-02-19 ENCOUNTER — Encounter: Payer: 59 | Admitting: Physical Therapy

## 2021-02-23 ENCOUNTER — Other Ambulatory Visit: Payer: Self-pay

## 2021-02-23 ENCOUNTER — Encounter: Payer: 59 | Admitting: Internal Medicine

## 2021-02-23 ENCOUNTER — Ambulatory Visit (INDEPENDENT_AMBULATORY_CARE_PROVIDER_SITE_OTHER): Payer: 59 | Admitting: Internal Medicine

## 2021-02-23 VITALS — BP 130/70 | HR 71 | Temp 97.5°F | Resp 16 | Ht 66.0 in | Wt 191.6 lb

## 2021-02-23 DIAGNOSIS — E7800 Pure hypercholesterolemia, unspecified: Secondary | ICD-10-CM

## 2021-02-23 DIAGNOSIS — Z0000 Encounter for general adult medical examination without abnormal findings: Secondary | ICD-10-CM | POA: Diagnosis not present

## 2021-02-23 DIAGNOSIS — R739 Hyperglycemia, unspecified: Secondary | ICD-10-CM

## 2021-02-23 DIAGNOSIS — M25551 Pain in right hip: Secondary | ICD-10-CM

## 2021-02-23 DIAGNOSIS — D72819 Decreased white blood cell count, unspecified: Secondary | ICD-10-CM

## 2021-02-23 DIAGNOSIS — K2100 Gastro-esophageal reflux disease with esophagitis, without bleeding: Secondary | ICD-10-CM

## 2021-02-23 DIAGNOSIS — D708 Other neutropenia: Secondary | ICD-10-CM

## 2021-02-23 LAB — CBC WITH DIFFERENTIAL/PLATELET
Basophils Absolute: 0 10*3/uL (ref 0.0–0.1)
Basophils Relative: 0.9 % (ref 0.0–3.0)
Eosinophils Absolute: 0 10*3/uL (ref 0.0–0.7)
Eosinophils Relative: 1 % (ref 0.0–5.0)
HCT: 40.1 % (ref 36.0–46.0)
Hemoglobin: 13.3 g/dL (ref 12.0–15.0)
Lymphocytes Relative: 44.3 % (ref 12.0–46.0)
Lymphs Abs: 1.4 10*3/uL (ref 0.7–4.0)
MCHC: 33.2 g/dL (ref 30.0–36.0)
MCV: 90.8 fl (ref 78.0–100.0)
Monocytes Absolute: 0.4 10*3/uL (ref 0.1–1.0)
Monocytes Relative: 13.4 % — ABNORMAL HIGH (ref 3.0–12.0)
Neutro Abs: 1.3 10*3/uL — ABNORMAL LOW (ref 1.4–7.7)
Neutrophils Relative %: 40.4 % — ABNORMAL LOW (ref 43.0–77.0)
Platelets: 214 10*3/uL (ref 150.0–400.0)
RBC: 4.41 Mil/uL (ref 3.87–5.11)
RDW: 14 % (ref 11.5–15.5)
WBC: 3.1 10*3/uL — ABNORMAL LOW (ref 4.0–10.5)

## 2021-02-23 NOTE — Progress Notes (Signed)
Patient ID: Kelly Massey, female   DOB: 04/06/1965, 55 y.o.   MRN: [id number]   Subjective:    Patient ID: Kelly Massey, female    DOB: 09-15-1965, 55 y.o.   MRN: [id number]  This visit occurred during the SARS-CoV-2 public health emergency.  Safety protocols were in place, including screening questions prior to the visit, additional usage of staff PPE, and extensive cleaning of exam room while observing appropriate contact time as indicated for disinfecting solutions.   Patient here for her physical exam.   Chief Complaint  Patient presents with   Annual Exam   .   HPI Sees gyn for pelvic and pap smears.  She is doing relatively well except for her right hip/lateral leg pain.  Has been evaluated by ortho 12/16/20 ([place]) - prednisone taper.  Has been going to PT - helping her back.  Pain to calf.  Doing exercises.  She wants to follow up at [facility] - has seen them previously - will call and make appt.  No chest pain.  Breathing stable.  Recent flare with acid reflux.  On pepcid.  Symptoms better now.  Has seen GI.  No abdominal pain.  Bowels moving.     Past Medical History:  Diagnosis Date   Allergy    Anemia    Arthritis    knees - no meds   Colon polyps    HPP_ 2012   GERD (gastroesophageal reflux disease) 10/12/2010   EGD, positive H. pylori   HSV infection    History   Hyperlipidemia    ? no meds - diet controlled   Post-operative nausea and vomiting    Seasonal allergies    Past Surgical History:  Procedure Laterality Date   88 HOUR [place] STUDY N/A 07/25/2019   Procedure: 24 HOUR [place] STUDY;  Surgeon: Lavena Bullion, DO;  Location: [facility];  Service: Gastroenterology;  Laterality: N/A;   ABDOMINAL HYSTERECTOMY     BALLOON DILATION N/A 07/24/2012   Procedure: BALLOON DILATION;  Surgeon: Inda Castle, MD;  Location: [facility];  Service: Endoscopy;  Laterality: N/A;   BRAVO [place] STUDY N/A 07/24/2012   Procedure: BRAVO [place] STUDY;  Surgeon: Inda Castle, MD;  Location: [facility];  Service: Endoscopy;  Laterality: N/A;   COLONOSCOPY     DILATION AND CURETTAGE OF UTERUS     SAB   ESOPHAGEAL MANOMETRY N/A 07/25/2019   Procedure: ESOPHAGEAL MANOMETRY (EM);  Surgeon: Lavena Bullion, DO;  Location: [facility];  Service: Gastroenterology;  Laterality: N/A;   ESOPHAGOGASTRODUODENOSCOPY N/A 07/24/2012   Procedure: ESOPHAGOGASTRODUODENOSCOPY (EGD);  Surgeon: Inda Castle, MD;  Location: [facility];  Service: Endoscopy;  Laterality: N/A;   OVARIAN CYST REMOVAL  2004   laparotomy -left   ROBOTIC ASSISTED LAPAROSCOPIC LYSIS OF ADHESION N/A 03/21/2014   Procedure: ROBOTIC ASSISTED LAPAROSCOPIC EXTENSIVE LYSIS OF ADHESION (1 Hour);  Surgeon: Marvene Staff, MD;  Location: [facility];  Service: Gynecology;  Laterality: N/A;   ROBOTIC ASSISTED SALPINGO OOPHERECTOMY Left 03/21/2014   Procedure:  ROBOTIC ASSISTED LEFT OOPHORECTOMY;  Surgeon: Marvene Staff, MD;  Location: [facility];  Service: Gynecology;  Laterality: Left;   rotator cuff surgery  2017   SHOULDER ARTHROSCOPY Left    TENNIS ELBOW RELEASE/NIRSCHEL PROCEDURE Right 05/01/2015   Procedure: RIGHT ELBOW DEBRIDEMENT AND TENDON REPAIR;  Surgeon: [name], MD;  Location: [facility];  Service: Orthopedics;  Laterality: Right;   UPPER GASTROINTESTINAL ENDOSCOPY  WISDOM TOOTH EXTRACTION     Family History  Problem Relation Age of Onset   Arthritis Mother    Stroke Mother    Hypertension Mother    Heart failure Mother    Dementia Mother    Lung cancer Father    Diabetes Sister    Diabetes Brother        x 2   Breast cancer Maternal Aunt    Throat cancer Maternal Aunt        Smoker   Esophageal cancer Maternal Aunt    Prostate cancer Maternal Uncle        x 2   Diabetes Maternal Uncle    Diabetes Paternal Grandmother    Diabetes Paternal Grandfather    Hypertension Other    Hyperlipidemia Other    Colon cancer Neg Hx    Stomach cancer Neg  Hx    Rectal cancer Neg Hx    Colon polyps Neg Hx    Social History   Socioeconomic History   Marital status: Married    Spouse name: Not on file   Number of children: 0   Years of education: Not on file   Highest education level: Not on file  Occupational History   Occupation: RESEARCH    Employer: [employer]  Tobacco Use   Smoking status: Never   Smokeless tobacco: Never  Vaping Use   Vaping Use: Never used  Substance and Sexual Activity   Alcohol use: Yes    Alcohol/week: 0.0 standard drinks    Comment: occassional   Drug use: No   Sexual activity: Yes    Birth control/protection: None, Surgical    Comment: hysterectomy  Other Topics Concern   Not on file  Social History Narrative   Transport planner    Social Determinants of Health   Financial Resource Strain: Not on file  Food Insecurity: Not on file  Transportation Needs: Not on file  Physical Activity: Not on file  Stress: Not on file  Social Connections: Not on file     Review of Systems  Constitutional:  Negative for appetite change and unexpected weight change.  HENT:  Negative for congestion, sinus pressure and sore throat.   Eyes:  Negative for pain and visual disturbance.  Respiratory:  Negative for cough, chest tightness and shortness of breath.   Cardiovascular:  Negative for chest pain, palpitations and leg swelling.  Gastrointestinal:  Negative for abdominal pain, diarrhea, nausea and vomiting.       Acid reflux flares as outlined.    Genitourinary:  Negative for difficulty urinating and dysuria.  Musculoskeletal:  Negative for joint swelling and myalgias.       Back and leg pain as outlined.    Skin:  Negative for color change and rash.  Neurological:  Negative for dizziness, light-headedness and headaches.  Hematological:  Negative for adenopathy. Does not bruise/bleed easily.  Psychiatric/Behavioral:  Negative for agitation and dysphoric mood.       Objective:     BP 130/70    Pulse 71   Temp (!) 97.5 F (36.4 C)   Resp 16   Ht 5' 6"  (1.676 m)   Wt 191 lb 9.6 oz (86.9 kg)   LMP 10/06/2011   SpO2 98%   BMI 30.93 kg/m  Wt Readings from Last 3 Encounters:  02/23/21 191 lb 9.6 oz (86.9 kg)  01/05/21 188 lb (85.3 kg)  01/02/21 188 lb (85.3 kg)    Physical Exam Vitals reviewed.  Constitutional:  General: She is not in acute distress.    Appearance: Normal appearance.  HENT:     Head: Normocephalic and atraumatic.     Right Ear: External ear normal.     Left Ear: External ear normal.  Eyes:     General: No scleral icterus.       Right eye: No discharge.        Left eye: No discharge.     Conjunctiva/sclera: Conjunctivae normal.  Neck:     Thyroid: No thyromegaly.  Cardiovascular:     Rate and Rhythm: Normal rate and regular rhythm.  Pulmonary:     Effort: No respiratory distress.     Breath sounds: Normal breath sounds. No wheezing.  Abdominal:     General: Bowel sounds are normal.     Palpations: Abdomen is soft.     Tenderness: There is no abdominal tenderness.  Musculoskeletal:        General: No swelling or tenderness.     Cervical back: Neck supple. No tenderness.  Lymphadenopathy:     Cervical: No cervical adenopathy.  Skin:    Findings: No erythema or rash.  Neurological:     Mental Status: She is alert.  Psychiatric:        Mood and Affect: Mood normal.        Behavior: Behavior normal.     Outpatient Encounter Medications as of 02/23/2021  Medication Sig   acyclovir (ZOVIRAX) 400 MG tablet TAKE 1 TABLET BY MOUTH 2 TIMES DAILY AS NEEDED.   Calcium Carbonate-Vitamin D 600-400 MG-UNIT tablet Take 1 tablet by mouth daily.   diphenhydrAMINE (BENADRYL) 12.5 MG/5ML elixir Take by mouth as needed.   famotidine (PEPCID) 20 MG tablet TAKE 1 TABLET BY MOUTH TWICE A DAY   ferrous sulfate 325 (65 FE) MG tablet Take 325 mg by mouth daily.    Fluocinolone Acetonide Body 0.01 % OIL Apply to scalp before shampooing. Use for 6-10 hours  with the cap each week to every other week.   fluticasone (FLONASE) 50 MCG/ACT nasal spray Place 2 sprays into both nostrils daily. To use in place of your Nasonex   mometasone (NASONEX) 50 MCG/ACT nasal spray PLACE 2 SPRAYS INTO THE NOSE DAILY.   vitamin E 400 UNIT capsule Take 400 Units by mouth daily. Reported on 10/09/2015   Facility-Administered Encounter Medications as of 02/23/2021  Medication   0.9 %  sodium chloride infusion     Lab Results  Component Value Date   WBC 3.1 (L) 02/23/2021   HGB 13.3 02/23/2021   HCT 40.1 02/23/2021   PLT 214.0 02/23/2021   GLUCOSE 107 (H) 12/31/2020   CHOL 245 (H) 12/31/2020   TRIG 92.0 12/31/2020   HDL 79.80 12/31/2020   LDLCALC 147 (H) 12/31/2020   ALT 11 12/31/2020   AST 13 12/31/2020   NA 140 12/31/2020   K 4.9 12/31/2020   CL 103 12/31/2020   CREATININE 0.83 12/31/2020   BUN 7 12/31/2020   CO2 31 12/31/2020   TSH 1.217 06/20/2020   HGBA1C 6.3 12/31/2020    MM 3D SCREEN BREAST BILATERAL  Result Date: 10/20/2020 CLINICAL DATA:  Screening. EXAM: DIGITAL SCREENING BILATERAL MAMMOGRAM WITH TOMOSYNTHESIS AND CAD TECHNIQUE: Bilateral screening digital craniocaudal and mediolateral oblique mammograms were obtained. Bilateral screening digital breast tomosynthesis was performed. The images were evaluated with computer-aided detection. COMPARISON:  Previous exam(s). ACR Breast Density Category c: The breast tissue is heterogeneously dense, which may obscure small masses. FINDINGS: There are no findings  suspicious for malignancy. IMPRESSION: No mammographic evidence of malignancy. A result letter of this screening mammogram will be mailed directly to the patient. RECOMMENDATION: Screening mammogram in one year. (Code:SM-B-01Y) BI-RADS CATEGORY  1: Negative. Electronically Signed   By: Kristopher Oppenheim M.D.   On: 10/20/2020 16:06       Assessment & Plan:   Problem List Items Addressed This Visit     GERD (gastroesophageal reflux disease)     Has been doing relatively well.  Recent flare.  On pepcid.  Symptoms improved.  Has had extensive GI w/up.  Follow.        Health care maintenance    Physical today 02/23/21.  Gets breasts, pelvic and pap smears through gyn.  Mammogram 10/20/20 - [facility] I.  Followed by GI.  Colonoscopy 01/05/21 (three polyps removed and diverticulosis).        Hypercholesterolemia    The 10-year ASCVD risk score (Arnett DK, et al., 2019) is: 2.9%   Values used to calculate the score:     Age: 34 years     Sex: Female     Is Non-Hispanic African American: Yes     Diabetic: No     Tobacco smoker: No     Systolic Blood Pressure: 076 mmHg     Is BP treated: No     HDL Cholesterol: 79.8 mg/dL     Total Cholesterol: 245 mg/dL  Low cholesterol diet and exercise.  Follow lipid panel.        Hyperglycemia    Watching her diet.  Low carb diet and exercise.  Follow met b and a1c.       Leukopenia   Relevant Orders   CBC with Differential/Platelet (Completed)   Other neutropenia ([place])    Has been worked up by hematology.  Felt related to PPI.  Follow cbc.  Recheck with net labs.       Right hip pain    Right hip and lateral thigh pain and pain to calf.  PT is helping her back.  Persistent symptoms.  Plans to f/u with [facility].  She will schedule appt.       Other Visit Diagnoses     Routine general medical examination at a health care facility    -  Primary        Einar Pheasant, MD

## 2021-02-23 NOTE — Assessment & Plan Note (Signed)
The 10-year ASCVD risk score (Arnett DK, et al., 2019) is: 2.9%   Values used to calculate the score:     Age: 55 years     Sex: Female     Is Non-Hispanic African American: Yes     Diabetic: No     Tobacco smoker: No     Systolic Blood Pressure: 403 mmHg     Is BP treated: No     HDL Cholesterol: 79.8 mg/dL     Total Cholesterol: 245 mg/dL  Low cholesterol diet and exercise.  Follow lipid panel.

## 2021-02-24 ENCOUNTER — Ambulatory Visit: Payer: 59 | Admitting: Physical Therapy

## 2021-02-24 NOTE — Patient Instructions (Incomplete)
°  °  TREATMENT     Manual Therapy - for symptom modulation, soft tissue sensitivity and mobility, joint mobility, ROM    STM/DTM T10-L2 R>L longissimus [minimal soft tissue sensitivity today], IASTM with hypervolt along bilateral gluteus medius   CPA T3-T10 gr III for mobility, T10-L5 gr II for pain control   R long-leg distraction for pain control, intermittent hold; 10 sec on, 10 sec off; 2 bouts of 5 minutes    *not today* STM gluteus medius       Therapeutic Exercise - for improved soft tissue flexibility and extensibility as needed for ROM, improved strength as needed to improve performance of CKC activities/functional movements   Open book; x10 ea dir    Piriformis stretch; 3x30 sec bilateral   Lower trunk rotations with QL bias (figure-4 position in hooklying), x10 R and L    Sidelying hip Abduction; x5, attempted in L sidelying (stopped due to increase in R gluteal and lateral thigh pain) Attempted regression to sidelying clamshell (pt has ongoing R gluteal pain and lateral thigh pain, referral to R fibular head without report of paresthesias)     *not today* Bridge with neutral-spine position; 2x10 Nautilus core walkout, with belt around waist; 70-lbs; x10; 3 directions (posterior, left, right) Bird dog; x10 alternating [heavy verbal and tactile cueing to avoid excessive truncal rotation and pelvic tilt]  Cat Camel; x10 ea dir Prone alt hip extension; 1x15 alternating bilateral LE  Dying bug; 2x10 alternating [moderate verbal cueing and demonstration for technique] Repeated extension in lying; 2x10       Cold pack (unbilled) - for anti-inflammatory and analgesic effect as needed for reduced pain and improved ability to participate in active PT intervention, along R hip in L sidelying, x 5 minutes [difficulty tolerating sidelying position today]       ASSESSMENT Patient has no notable pain affecting lumbosacral region at this time. She has ongoing R lateral hip  pain (affecting L hip at lesser intensity) with no report of paresthesias or sciatica-type symptoms. She has increase in lateral hip pain with attempted gluteus medius isotonics in sidelying (poor tolerance of sidelying hip abduction and clamshell). Attempted use of cold pack in L sidelying for R hip with pillow in between knees - pt has ongoing lateral thigh pain with referral down to R fibular head. This is mitigated with use of second round of R long-leg distraction. Pt has remaining pain with rolling on mat and reports continued R lateral hip discomfort at end of session. Discussed with pt holding on significant loading for this region temporarily, use of cryotherapy and/or thermotherapy as needed at home, and continued gentle stretching program. Patient has remaining deficits in mild thoracolumbar AROM loss, hip strength, thoracic spine mobility,  intermittent thoracolumbar pain with variable laterality, and R>L lateral hip pain with no pain provocation with direct palpation to greater trochanter/trochanteric bursa. Patient will benefit from continued skilled therapeutic intervention to address the above deficits as needed for improved function and QoL.

## 2021-03-01 ENCOUNTER — Encounter: Payer: Self-pay | Admitting: Internal Medicine

## 2021-03-01 DIAGNOSIS — M25551 Pain in right hip: Secondary | ICD-10-CM | POA: Insufficient documentation

## 2021-03-01 NOTE — Assessment & Plan Note (Signed)
Has been worked up by hematology.  Felt related to PPI.  Follow cbc.  Recheck with net labs.  

## 2021-03-01 NOTE — Assessment & Plan Note (Signed)
Watching her diet.  Low carb diet and exercise.  Follow met b and a1c.  ?

## 2021-03-01 NOTE — Assessment & Plan Note (Addendum)
Physical today 02/23/21.  Gets breasts, pelvic and pap smears through gyn.  Mammogram 10/20/20 - [facility].  Followed by GI.  Colonoscopy 01/05/21 (three polyps removed and diverticulosis).

## 2021-03-01 NOTE — Assessment & Plan Note (Signed)
Right hip and lateral thigh pain and pain to calf.  PT is helping her back.  Persistent symptoms.  Plans to f/u with [facility].  She will schedule appt.

## 2021-03-01 NOTE — Assessment & Plan Note (Signed)
Has been doing relatively well.  Recent flare.  On pepcid.  Symptoms improved.  Has had extensive GI w/up.  Follow.

## 2021-03-03 ENCOUNTER — Encounter: Payer: 59 | Admitting: Physical Therapy

## 2021-03-27 ENCOUNTER — Other Ambulatory Visit: Payer: Self-pay | Admitting: Internal Medicine

## 2021-04-13 ENCOUNTER — Telehealth: Payer: Self-pay | Admitting: Internal Medicine

## 2021-04-13 NOTE — Telephone Encounter (Signed)
Called patient to schedule her for work in appt. She is seeing ortho on Wednesday at [facility]. Patient will f/u after if appt with PCP is needed.

## 2021-04-13 NOTE — Telephone Encounter (Signed)
Pt called in stating that she had a terrible fall on Dec 30th. Pt stated that she was walking through her kitchen door going outside when she fell in the door way. Pt stated that her left breast is sore. Pt stated that she thought the soreness would go away but the soreness remain. No avail appt. Pt was transfer to access nurse.

## 2021-04-14 NOTE — Telephone Encounter (Signed)
FYI- Triage note is attached below. Patient is seeing ortho tomorrow and will follow up with pcp if needed.

## 2021-04-14 NOTE — Telephone Encounter (Signed)
Noted  

## 2021-04-14 NOTE — Telephone Encounter (Signed)
Agree with need for evaluation.  

## 2021-04-16 ENCOUNTER — Other Ambulatory Visit: Payer: Self-pay

## 2021-04-16 ENCOUNTER — Ambulatory Visit: Payer: 59 | Attending: Podiatry | Admitting: Physical Therapy

## 2021-04-16 DIAGNOSIS — M6281 Muscle weakness (generalized): Secondary | ICD-10-CM | POA: Diagnosis present

## 2021-04-16 DIAGNOSIS — M25511 Pain in right shoulder: Secondary | ICD-10-CM | POA: Diagnosis present

## 2021-04-16 DIAGNOSIS — R0789 Other chest pain: Secondary | ICD-10-CM | POA: Insufficient documentation

## 2021-04-16 NOTE — Therapy (Signed)
[facility] [address] [place], [place], [address] Phone: [phone]   Fax:  [phone]  Physical Therapy Evaluation  Patient Details  Name: Kelly Massey MRN: [id number] Date of Birth: June 06, 1965 Referring Provider (PT): Edmonia Lynch, MD   Encounter Date: 04/16/2021   PT End of Session - 04/16/21 1548     Visit Number 1    Number of Visits 13    Date for PT Re-Evaluation 05/28/21    Authorization Type [employer] 2023, 60 combined PT/OT/Speech per calendar year    PT Start Time 1345    PT Stop Time 1435    PT Time Calculation (min) 50 min    Activity Tolerance Patient tolerated treatment well;Patient limited by pain    Behavior During Therapy [facility] for tasks assessed/performed             Past Medical History:  Diagnosis Date   Allergy    Anemia    Arthritis    knees - no meds   Colon polyps    HPP_ 2012   GERD (gastroesophageal reflux disease) 10/12/2010   EGD, positive H. pylori   HSV infection    History   Hyperlipidemia    ? no meds - diet controlled   Post-operative nausea and vomiting    Seasonal allergies     Past Surgical History:  Procedure Laterality Date   35 HOUR [place] STUDY N/A 07/25/2019   Procedure: 24 HOUR [place] STUDY;  Surgeon: Lavena Bullion, DO;  Location: [facility];  Service: Gastroenterology;  Laterality: N/A;   ABDOMINAL HYSTERECTOMY     BALLOON DILATION N/A 07/24/2012   Procedure: BALLOON DILATION;  Surgeon: Inda Castle, MD;  Location: [facility];  Service: Endoscopy;  Laterality: N/A;   BRAVO [place] STUDY N/A 07/24/2012   Procedure: BRAVO [place] STUDY;  Surgeon: Inda Castle, MD;  Location: [facility];  Service: Endoscopy;  Laterality: N/A;   COLONOSCOPY     DILATION AND CURETTAGE OF UTERUS     SAB   ESOPHAGEAL MANOMETRY N/A 07/25/2019   Procedure: ESOPHAGEAL MANOMETRY (EM);  Surgeon: Lavena Bullion, DO;  Location: [facility];  Service: Gastroenterology;  Laterality: N/A;    ESOPHAGOGASTRODUODENOSCOPY N/A 07/24/2012   Procedure: ESOPHAGOGASTRODUODENOSCOPY (EGD);  Surgeon: Inda Castle, MD;  Location: [facility];  Service: Endoscopy;  Laterality: N/A;   OVARIAN CYST REMOVAL  2004   laparotomy -left   ROBOTIC ASSISTED LAPAROSCOPIC LYSIS OF ADHESION N/A 03/21/2014   Procedure: ROBOTIC ASSISTED LAPAROSCOPIC EXTENSIVE LYSIS OF ADHESION (1 Hour);  Surgeon: Marvene Staff, MD;  Location: [facility];  Service: Gynecology;  Laterality: N/A;   ROBOTIC ASSISTED SALPINGO OOPHERECTOMY Left 03/21/2014   Procedure:  ROBOTIC ASSISTED LEFT OOPHORECTOMY;  Surgeon: Marvene Staff, MD;  Location: [facility];  Service: Gynecology;  Laterality: Left;   rotator cuff surgery  2017   SHOULDER ARTHROSCOPY Left    TENNIS ELBOW RELEASE/NIRSCHEL PROCEDURE Right 05/01/2015   Procedure: RIGHT ELBOW DEBRIDEMENT AND TENDON REPAIR;  Surgeon: [name], MD;  Location: [facility];  Service: Orthopedics;  Laterality: Right;   UPPER GASTROINTESTINAL ENDOSCOPY     WISDOM TOOTH EXTRACTION      There were no vitals filed for this visit.    Subjective Assessment - 04/16/21 1550     Subjective Pt is a 56 year old female with primary complaint of R shoulder/pectoral pain.    Pertinent History Pt is a 56 year old female with primary complaint of R shoulder/pectoral  pain. Symptoms began when patient was moving her Christmas tree and had fall (DOI: 04/03/21). Pt was holding Christmas tree box and landed onto R arm/shoulder. Pt had X-ray on shoulder and had no fracture or osseous abnormality. Pt reports difficulty performing pushing and lifting at work following this injury. Patient reports some tingling her hands over the last 2 weeks. Patient reports pain along L paracervical region the first 2 nights after her fall; this "eased off." Patient reports pain with sneezing or coughing. She reports doing well with inhale/exhale. Pt did not have bruising or change in skin color. Patient  is out of work now; has tentative return to work date on 04/29/21. Pt works at [employer] and performs physical labor e.g. lifting, pushing, shoveling.    Limitations Lifting;House hold activities   reaching cross-body   Diagnostic tests X-ray of R shoulder: Negative    Currently in Pain? Yes    Pain Score 4     Pain Location Chest    Pain Orientation Anterior    Pain Descriptors / Indicators Sore;Aching    Pain Onset 1 to 4 weeks ago    Aggravating Factors  lying flat in supine, supine to sit transfer, cross-body adduction    Pain Relieving Factors ibuprofen              [facility] PT Assessment - 04/17/21 1135       Assessment   Medical Diagnosis left shoulder pain, neck pain    Referring Provider (PT) Edmonia Lynch, MD    Onset Date/Surgical Date 04/03/21    Hand Dominance Right    Next MD Visit Not currently scheduled    Prior Therapy None for present condition      Balance Screen   Has the patient fallen in the past 6 months Yes    How many times? 1   fall when carrying Christmas tree box 04/03/29     Prior Function   Level of Independence Independent      Cognition   Overall Cognitive Status Within Functional Limits for tasks assessed              SUBJECTIVE Chief complaint: Pt is a 56 year old female with primary complaint of R shoulder/pectoral pain. Onset: Pt is a 56 year old female with primary complaint of R shoulder/pectoral pain. Symptoms began when patient was moving her Christmas tree and had fall (DOI: 04/03/21). Pt was holding Christmas tree box and landed onto R arm/shoulder. Pt had X-ray on shoulder and had no fracture or osseous abnormality. Pt reports difficulty performing pushing and lifting at work following this injury. Patient reports some tingling her hands over the last 2 weeks. Patient reports pain along L paracervical region the first 2 nights after her fall; this "eased off." Patient reports pain with sneezing or coughing. She reports doing well  with inhale/exhale. Pt did not have bruising or change in skin color. Patient is out of work now; has tentative return to work date on 04/29/21. Pt works at [employer] and performs physical labor e.g. lifting, pushing, shoveling.  Shoulder trauma: Yes , fall onto R arm; see above Pain location: R pectoral region Pain quality: toothache, deep soreness Pain: 3-4/10 Present, 8/10 Worst: Aggravating factors: lying flat in supine, supine to sit transfer, cross-body adduction Easing factors: ibuprofen 24 hour pain behavior: worse at night, lying down Radiating pain: No Numbness/Tingling: Yes, bilat hands prior to her injury  Prior history of shoulder injury or pain: No Prior history of therapy for shoulder:  No Follow-up appointment with MD: Not currently scheduled Referring MD: Edmonia Lynch, MD Dominant hand: Right    OBJECTIVE  MUSCULOSKELETAL: Tremor: Normal Bulk: Normal Tone: Normal  Cervical Screen AROM: WFL and painless with overpressure in all planes Spurlings A (ipsilateral lateral flexion/axial compression): R: Negative L: Negative Hoffman Sign (cervical cord compression): R: Negative L: Negative   Palpation Tenderness to palpation along R costal cartilage at ribs 2-5, R pectoralis major along medial and lateral portion  Strength R/L 4*/5 Shoulder flexion (anterior deltoid/pec major/coracobrachialis, axillary n. (C5-6) and musculocutaneous n. (C5-7)) 4/4+ Shoulder abduction (deltoid/supraspinatus, axillary/suprascapular n, C5) 4/4+ Shoulder external rotation (infraspinatus/teres minor) 4/4+ Shoulder internal rotation (subcapularis/lats/pec major) 4*/5 Shoulder horizontal adduction  5/5 Elbow flexion (biceps brachii, brachialis, brachioradialis, musculoskeletal n, C5-6) 5/4+ Elbow extension (triceps, radial n, C7)   AROM R/L 167/156 Shoulder flexion 154*/165 Shoulder abduction 82*/86 Shoulder external rotation 70/70 Shoulder internal rotation 60/60 Shoulder  extension *Indicates pain, overpressure performed unless otherwise indicated  PROM R/L 170/180 Shoulder flexion 170*/180 Shoulder abduction 90/90 Shoulder external rotation 70/70 Shoulder internal rotation *Indicates pain, overpressure performed unless otherwise indicated  Costosternal Pt exhibits normal bucket handle and pump handle movement of ribcage, no reproduction of pain with inspiration or asymmetry with ribcage expansion    NEUROLOGICAL:  Mental Status Patient is oriented to person, place and time.  Recent memory is intact.  Remote memory is intact.  Attention span and concentration are intact.  Expressive speech is intact.  Patient's fund of knowledge is within normal limits for educational level.  Sensation Grossly intact to light touch bilateral UE as determined by testing dermatomes C2-T2 Proprioception and hot/cold testing deferred on this date  Reflexes R/L 2+/2+ Biceps 2+/2+ Brachioradialis 1+/1+ Triceps   SPECIAL TESTS  Rotator Cuff  Drop Arm Test: Not examined Painful Arc (Pain from 60 to 120 degrees scaption): Negative Infraspinatus Muscle Test: Negative If all 3 tests positive, the probability of a full-thickness rotator cuff tear is 91%  Subacromial Impingement Painful Arc (Pain from 60 to 120 degrees scaption): Negative Empty Can: Negative External Rotation Resistance: Negative Horizontal Adduction: Positive for pain along pectoral region Scapular Assist: Not examined     TREATMENT  Manual Therapy - for symptom modulation, soft tissue sensitivity and mobility as needed for shoulder complex ROM  STM R pectoralis major   Cold pack (unbilled) - for anti-inflammatory and analgesic effect as needed for reduced pain and improved ability to participate in active PT intervention, draped over R shoulder and along R chest well in reclined position, x 5 minutes     PT Education - 04/17/21 1133     Education provided Yes    Education  Details Patient education on current condition, anatomy involved, prognosis, plan of care. Discussion on activity modification to prevent flare-up of condition, including avoidance of heavy pushing and lifting, modification to sleeping/supine position and supine to sit transfer.    Person(s) Educated Patient    Methods Explanation;Demonstration;Verbal cues    Comprehension Verbalized understanding;Returned demonstration                ASSESSMENT Pt is a pleasant 56 year old high-functioning female referred for R shoulder/pectoral region pain following fall on 04/03/21 with negative shoulder radiographs following this injury. Negative cervical spine screen and no significant hard neuro signs. Clinical presentation is consistent with contusion of pectoral region. PT examination reveals deficits in R shoulder AROM, R deltoid and RTC strength, R horizontal adduction strength, local nociceptive pain along R medial and lateral pectoral region.  Pt will benefit from PT services to address deficits in strength, mobility, and pain in order to return to full function at home and work with less shoulder pain.     PT Short Term Goals - 04/17/21 1124       PT SHORT TERM GOAL #1   Title Pt will be independent and 100% compliant with HEP and activity modification in order to improve strength and decrease pain in order to improve pain-free function at home and work.    Baseline 04/16/21: no HEP initiated today, discussed activity mod.    Time 3    Period Weeks    Status New    Target Date 05/08/21      PT SHORT TERM GOAL #2   Title Patient will have shoulder AROM symmetrical to opposite UE without reproduction of pain as needed for functional reaching, overhead work, self-care, and recovery of function    Baseline 04/16/21: Shoulder AROM R/L Flexion 167/156, ABD 154/165, ER 82/86 (pain with shoulder ABD and ER).    Time 4    Period Weeks    Status New    Target Date 05/14/21                PT Long Term Goals - 04/16/21 1431       PT LONG TERM GOAL #1   Title Patient will demonstrate improved function as evidenced by a score of 71 on FOTO measure for full participation in activities at home and in the community.    Baseline 04/16/21: 55    Time 6    Period Weeks    Status New    Target Date 05/28/21      PT LONG TERM GOAL #2   Title Pt will decrease worst pain as reported on NPRS by at least 3 points in order to demonstrate clinically significant reduction in pain.    Baseline 04/16/21: Pain 8/10 at worst    Time 6    Period Weeks    Status New    Target Date 05/28/21      PT LONG TERM GOAL #3   Title Patient will demonstrate box lift from floor to waist and box transfer from surface to surface simulating work duties with load usually managed in working environment with sound body mechanics and no reproduction of back pain    Baseline 04/16/21: Patient is out of work at this time and unable to perform full physical work duties (lifting, pushing, pulling)    Time 6    Period Weeks    Target Date 05/28/21      PT LONG TERM GOAL #4   Title Patient will have MMT 4+/5 or greater for all R upper limb muscles assessed without reproduction of symptoms indicative of improved strength and improved tolerance of loading affected tissues as needed for performance of lifting/pushing/pulling duties at work    Baseline 04/16/21: MMT R Shoulder flexion 4*, ABD 4, ER 4, IR 4, horizontal adduction 4*    Time 6    Period Weeks    Status New    Target Date 05/28/21                    Plan - 04/17/21 1121     Clinical Impression Statement Pt is a pleasant 56 year old high-functioning female referred for R shoulder/pectoral region pain following fall on 04/03/21 with negative shoulder radiographs following this injury. Negative cervical spine screen and no significant hard neuro signs. Clinical presentation is consistent with  contusion of pectoral region. PT examination reveals  deficits in R shoulder AROM, R deltoid and RTC strength, R horizontal adduction strength, local nociceptive pain along R medial and lateral pectoral region. Pt will benefit from PT services to address deficits in strength, mobility, and pain in order to return to full function at home and work with less shoulder pain.    Personal Factors and Comorbidities Past/Current Experience;Comorbidity 3+;Profession   pt's job requires heavy physical labor   Comorbidities GERD, Barret's esophagus, high cholesterol, leukopenia    Examination-Activity Limitations Lift;Reach Overhead;Bed Mobility;Carry    Examination-Participation Restrictions Occupation;Community Activity    Stability/Clinical Decision Making Stable/Uncomplicated    Clinical Decision Making Low    Rehab Potential Good    Clinical Impairments Affecting Rehab Potential Positive: Age. Motivation. Active lifestyle. Negative: heavy physical work required for job (pushing, pulling, lifting, shoveling)    PT Frequency 2x / week    PT Duration 6 weeks    PT Treatment/Interventions Electrical Stimulation;Therapeutic activities;Therapeutic exercise;Neuromuscular re-education;Manual techniques;Patient/family education;Dry needling;Moist Heat;Cryotherapy    PT Next Visit Plan Activity modification and manual therapy to improve sensitization and soft tissue mobility, gradual restoration of shoulder girdle ROM, progressive loading of affected tissues once able and gradual recovery of function    PT Home Exercise Plan Formal home program to be initiated next week    Consulted and Agree with Plan of Care Patient             Patient will benefit from skilled therapeutic intervention in order to improve the following deficits and impairments:  Decreased range of motion, Decreased strength, Pain, Impaired flexibility, Impaired UE functional use  Visit Diagnosis: Acute pain of right shoulder  Acute chest wall pain  Muscle weakness  (generalized)     Problem List Patient Active Problem List   Diagnosis Date Noted   Right hip pain 03/01/2021   Plantar fasciitis 01/05/2021   Hyperglycemia 07/23/2020   Sciatica 02/23/2020   Tinnitus 12/02/2019   Belching    Globus sensation    Hot flashes 07/01/2019   Loss of taste 02/05/2019   Headache 08/28/2018   Exposure to COVID-19 virus 08/24/2018   Chest pain 10/30/2017   Witnessed apneic spells 10/31/2016   Other neutropenia ([place]) 08/10/2016   Vertigo 07/28/2016   Health care maintenance 11/21/2014   History of colonic polyps 11/21/2014   Dysphagia 11/19/2014   Trochanteric bursitis of left hip 10/08/2014   URI (upper respiratory infection) 02/10/2014   Pelvic pain in female 12/25/2013   Personal history of ovarian cyst 12/25/2013   Change in bowel movement 11/11/2013   breast tenderness 09/29/2013   Dysphagia, unspecified(787.20) 06/12/2012   Barrett's esophagus 06/12/2012   Environmental allergies 02/13/2012   Enlarged thyroid 02/13/2012   GERD (gastroesophageal reflux disease) 02/08/2012   Leukopenia 02/08/2012   Fatigue 02/08/2012   Hypercholesterolemia 02/08/2012   Valentina Gu, PT, DPT #[id number]  Eilleen Kempf, PT 04/17/2021, 11:37 AM  [facility] [address] [place], [place], [address] Phone: [phone]   Fax:  [phone]  Name: Kelly Massey MRN: [id number] Date of Birth: [birth year]

## 2021-04-17 ENCOUNTER — Encounter: Payer: Self-pay | Admitting: Physical Therapy

## 2021-04-20 ENCOUNTER — Other Ambulatory Visit: Payer: Self-pay

## 2021-04-20 ENCOUNTER — Encounter: Payer: Self-pay | Admitting: Physical Therapy

## 2021-04-20 ENCOUNTER — Ambulatory Visit: Payer: 59 | Admitting: Physical Therapy

## 2021-04-20 DIAGNOSIS — R0789 Other chest pain: Secondary | ICD-10-CM

## 2021-04-20 DIAGNOSIS — M6281 Muscle weakness (generalized): Secondary | ICD-10-CM

## 2021-04-20 DIAGNOSIS — M25511 Pain in right shoulder: Secondary | ICD-10-CM

## 2021-04-20 NOTE — Therapy (Signed)
[facility] [address] [place], [place], [address] Phone: [phone]   Fax:  [phone]  Physical Therapy Treatment  Patient Details  Name: Kelly Massey MRN: [id number] Date of Birth: 09/30/1965 Referring Provider (PT): Edmonia Lynch, MD   Encounter Date: 04/20/2021   PT End of Session - 04/20/21 1214     Visit Number 2    Number of Visits 13    Date for PT Re-Evaluation 05/28/21    Authorization Type [employer] 2023, 60 combined PT/OT/Speech per calendar year    PT Start Time 1150    PT Stop Time 5465    PT Time Calculation (min) 45 min    Activity Tolerance Patient tolerated treatment well;Patient limited by pain    Behavior During Therapy [facility] for tasks assessed/performed             Past Medical History:  Diagnosis Date   Allergy    Anemia    Arthritis    knees - no meds   Colon polyps    HPP_ 2012   GERD (gastroesophageal reflux disease) 10/12/2010   EGD, positive H. pylori   HSV infection    History   Hyperlipidemia    ? no meds - diet controlled   Post-operative nausea and vomiting    Seasonal allergies     Past Surgical History:  Procedure Laterality Date   46 HOUR [place] STUDY N/A 07/25/2019   Procedure: 24 HOUR [place] STUDY;  Surgeon: Lavena Bullion, DO;  Location: [facility];  Service: Gastroenterology;  Laterality: N/A;   ABDOMINAL HYSTERECTOMY     BALLOON DILATION N/A 07/24/2012   Procedure: BALLOON DILATION;  Surgeon: Inda Castle, MD;  Location: [facility];  Service: Endoscopy;  Laterality: N/A;   BRAVO Start STUDY N/A 07/24/2012   Procedure: BRAVO [place] STUDY;  Surgeon: Inda Castle, MD;  Location: [facility];  Service: Endoscopy;  Laterality: N/A;   COLONOSCOPY     DILATION AND CURETTAGE OF UTERUS     SAB   ESOPHAGEAL MANOMETRY N/A 07/25/2019   Procedure: ESOPHAGEAL MANOMETRY (EM);  Surgeon: Lavena Bullion, DO;  Location: [facility];  Service: Gastroenterology;  Laterality: N/A;    ESOPHAGOGASTRODUODENOSCOPY N/A 07/24/2012   Procedure: ESOPHAGOGASTRODUODENOSCOPY (EGD);  Surgeon: Inda Castle, MD;  Location: [facility];  Service: Endoscopy;  Laterality: N/A;   OVARIAN CYST REMOVAL  2004   laparotomy -left   ROBOTIC ASSISTED LAPAROSCOPIC LYSIS OF ADHESION N/A 03/21/2014   Procedure: ROBOTIC ASSISTED LAPAROSCOPIC EXTENSIVE LYSIS OF ADHESION (1 Hour);  Surgeon: Marvene Staff, MD;  Location: [facility];  Service: Gynecology;  Laterality: N/A;   ROBOTIC ASSISTED SALPINGO OOPHERECTOMY Left 03/21/2014   Procedure:  ROBOTIC ASSISTED LEFT OOPHORECTOMY;  Surgeon: Marvene Staff, MD;  Location: [facility];  Service: Gynecology;  Laterality: Left;   rotator cuff surgery  2017   SHOULDER ARTHROSCOPY Left    TENNIS ELBOW RELEASE/NIRSCHEL PROCEDURE Right 05/01/2015   Procedure: RIGHT ELBOW DEBRIDEMENT AND TENDON REPAIR;  Surgeon: [name], MD;  Location: [facility];  Service: Orthopedics;  Laterality: Right;   UPPER GASTROINTESTINAL ENDOSCOPY     WISDOM TOOTH EXTRACTION      There were no vitals filed for this visit.   Subjective Assessment - 04/20/21 1151     Subjective Patient reports most discomfort with transferring supine to/from sit along R pectoral region. Patient reports continued activity modification since her IE. Patient reports minimal pain at arrival to PT.  She reports sweeping this AM and used L hand more for this task today.    Pertinent History Pt is a 56 year old female with primary complaint of R shoulder/pectoral pain. Symptoms began when patient was moving her Christmas tree and had fall (DOI: 04/03/21). Pt was holding Christmas tree box and landed onto R arm/shoulder. Pt had X-ray on shoulder and had no fracture or osseous abnormality. Pt reports difficulty performing pushing and lifting at work following this injury. Patient reports some tingling her hands over the last 2 weeks. Patient reports pain along L paracervical region the  first 2 nights after her fall; this "eased off." Patient reports pain with sneezing or coughing. She reports doing well with inhale/exhale. Pt did not have bruising or change in skin color. Patient is out of work now; has tentative return to work date on 04/29/21. Pt works at [employer] and performs physical labor e.g. lifting, pushing, shoveling.    Limitations Lifting;House hold activities   reaching cross-body   Diagnostic tests X-ray of R shoulder: Negative    Pain Onset 1 to 4 weeks ago                TREATMENT   Manual Therapy - for symptom modulation, soft tissue sensitivity and mobility as needed for shoulder complex ROM   STM R pectoralis major [emphasize on lateral portion] R shoulder PROM within pt tolerance [no significant limitation in flexion, abduction, or ER/IR at 90 deg abduction]   Therapeutic Exercise - for improved soft tissue flexibility and extensibility as needed for ROM, shoulder ROM as needed for reaching and self-care ADLs isometrics to promote soft tissue healing and promote tissue remodeling  Shoulder AAROM with wand; abduction in reclined position; 20x Shoulder AAROM with wand; horizontal adduction/abduction in reclined position; 20x Shoulder isometrics; flexion and IR; 2x10 each, 5 sec hold  Patient education: Reviewed activity modification and expected progression with natural history of patient's condition. Updated HEP to include isometrics for analgesic effect and to promote tissue healing.    *next visit* Pec major stretch (90 deg abd), doorway;  Column L in doorway      Cold pack (unbilled) - for anti-inflammatory and analgesic effect as needed for reduced pain and improved ability to participate in active PT intervention, draped over R shoulder and along R chest well in reclined position, x 5 minutes       ASSESSMENT Patient arrives with excellent motivation to participate in physical therapy. She has minimal symptoms at arrival, but  ongoing intermittent moderate pain with active cross-body adduction and sitting up from supine/reclined position. Utilized electronic recline on table today to improve comfort with supine to/from sit transferring. Patient has no significant shoulder PROM deficits and improving sensitivity along pectoralis mm. She is able to initiate AAROM into cross-body adduction without notable pain and flexion/IR isometrics with minimal discomfort. R shoulder AROM, R deltoid and RTC strength, R horizontal adduction strength, local nociceptive pain along R medial and lateral pectoral region. Patient will benefit from continued skilled therapeutic intervention to address the above deficits as needed for improved function and QoL.     PT Short Term Goals - 04/17/21 1124       PT SHORT TERM GOAL #1   Title Pt will be independent and 100% compliant with HEP and activity modification in order to improve strength and decrease pain in order to improve pain-free function at home and work.    Baseline 04/16/21: no HEP initiated today, discussed activity mod.  Time 3    Period Weeks    Status New    Target Date 05/08/21      PT SHORT TERM GOAL #2   Title Patient will have shoulder AROM symmetrical to opposite UE without reproduction of pain as needed for functional reaching, overhead work, self-care, and recovery of function    Baseline 04/16/21: Shoulder AROM R/L Flexion 167/156, ABD 154/165, ER 82/86 (pain with shoulder ABD and ER).    Time 4    Period Weeks    Status New    Target Date 05/14/21               PT Long Term Goals - 04/16/21 1431       PT LONG TERM GOAL #1   Title Patient will demonstrate improved function as evidenced by a score of 71 on FOTO measure for full participation in activities at home and in the community.    Baseline 04/16/21: 55    Time 6    Period Weeks    Status New    Target Date 05/28/21      PT LONG TERM GOAL #2   Title Pt will decrease worst pain as reported on NPRS  by at least 3 points in order to demonstrate clinically significant reduction in pain.    Baseline 04/16/21: Pain 8/10 at worst    Time 6    Period Weeks    Status New    Target Date 05/28/21      PT LONG TERM GOAL #3   Title Patient will demonstrate box lift from floor to waist and box transfer from surface to surface simulating work duties with load usually managed in working environment with sound body mechanics and no reproduction of back pain    Baseline 04/16/21: Patient is out of work at this time and unable to perform full physical work duties (lifting, pushing, pulling)    Time 6    Period Weeks    Target Date 05/28/21      PT LONG TERM GOAL #4   Title Patient will have MMT 4+/5 or greater for all R upper limb muscles assessed without reproduction of symptoms indicative of improved strength and improved tolerance of loading affected tissues as needed for performance of lifting/pushing/pulling duties at work    Baseline 04/16/21: MMT R Shoulder flexion 4*, ABD 4, ER 4, IR 4, horizontal adduction 4*    Time 6    Period Weeks    Status New    Target Date 05/28/21                   Plan - 04/20/21 1447     Clinical Impression Statement Patient arrives with excellent motivation to participate in physical therapy. She has minimal symptoms at arrival, but ongoing intermittent moderate pain with active cross-body adduction and sitting up from supine/reclined position. Utilized electronic recline on table today to improve comfort with supine to/from sit transferring. Patient has no significant shoulder PROM deficits and improving sensitivity along pectoralis mm. She is able to initiate AAROM into cross-body adduction without notable pain and flexion/IR isometrics with minimal discomfort. R shoulder AROM, R deltoid and RTC strength, R horizontal adduction strength, local nociceptive pain along R medial and lateral pectoral region. Patient will benefit from continued skilled therapeutic  intervention to address the above deficits as needed for improved function and QoL.    Personal Factors and Comorbidities Past/Current Experience;Comorbidity 3+;Profession   pt's job requires heavy physical labor  Comorbidities GERD, Barret's esophagus, high cholesterol, leukopenia    Examination-Activity Limitations Lift;Reach Overhead;Bed Mobility;Carry    Examination-Participation Restrictions Occupation;Community Activity    Stability/Clinical Decision Making Stable/Uncomplicated    Rehab Potential Good    Clinical Impairments Affecting Rehab Potential Positive: Age. Motivation. Active lifestyle. Negative: heavy physical work required for job (pushing, pulling, lifting, shoveling)    PT Frequency 2x / week    PT Duration 6 weeks    PT Treatment/Interventions Electrical Stimulation;Therapeutic activities;Therapeutic exercise;Neuromuscular re-education;Manual techniques;Patient/family education;Dry needling;Moist Heat;Cryotherapy    PT Next Visit Plan Activity modification and manual therapy to improve sensitization and soft tissue mobility, gradual restoration of shoulder girdle ROM, progressive loading of affected tissues once able and gradual recovery of function    PT Home Exercise Plan Access Code [email or web address]    Consulted and Agree with Plan of Care Patient             Patient will benefit from skilled therapeutic intervention in order to improve the following deficits and impairments:  Decreased range of motion, Decreased strength, Pain, Impaired flexibility, Impaired UE functional use  Visit Diagnosis: Acute pain of right shoulder  Acute chest wall pain  Muscle weakness (generalized)     Problem List Patient Active Problem List   Diagnosis Date Noted   Right hip pain 03/01/2021   Plantar fasciitis 01/05/2021   Hyperglycemia 07/23/2020   Sciatica 02/23/2020   Tinnitus 12/02/2019   Belching    Globus sensation    Hot flashes 07/01/2019   Loss of taste 02/05/2019    Headache 08/28/2018   Exposure to COVID-19 virus 08/24/2018   Chest pain 10/30/2017   Witnessed apneic spells 10/31/2016   Other neutropenia ([place]) 08/10/2016   Vertigo 07/28/2016   Health care maintenance 11/21/2014   History of colonic polyps 11/21/2014   Dysphagia 11/19/2014   Trochanteric bursitis of left hip 10/08/2014   URI (upper respiratory infection) 02/10/2014   Pelvic pain in female 12/25/2013   Personal history of ovarian cyst 12/25/2013   Change in bowel movement 11/11/2013   breast tenderness 09/29/2013   Dysphagia, unspecified(787.20) 06/12/2012   Barrett's esophagus 06/12/2012   Environmental allergies 02/13/2012   Enlarged thyroid 02/13/2012   GERD (gastroesophageal reflux disease) 02/08/2012   Leukopenia 02/08/2012   Fatigue 02/08/2012   Hypercholesterolemia 02/08/2012   Valentina Gu, PT, DPT #[id number]  Eilleen Kempf, PT 04/20/2021, 2:48 PM  [facility] [address]. [place], [place], [address] Phone: [phone]   Fax:  [phone]  Name: Kelly Massey MRN: [id number] Date of Birth: 14-Sep-1965

## 2021-04-22 ENCOUNTER — Ambulatory Visit: Payer: 59 | Admitting: Physical Therapy

## 2021-04-27 ENCOUNTER — Ambulatory Visit: Payer: 59 | Admitting: Physical Therapy

## 2021-04-27 ENCOUNTER — Other Ambulatory Visit: Payer: Self-pay

## 2021-04-27 DIAGNOSIS — R0789 Other chest pain: Secondary | ICD-10-CM

## 2021-04-27 DIAGNOSIS — M6281 Muscle weakness (generalized): Secondary | ICD-10-CM

## 2021-04-27 DIAGNOSIS — M25511 Pain in right shoulder: Secondary | ICD-10-CM | POA: Diagnosis not present

## 2021-04-27 NOTE — Therapy (Signed)
[facility] [address] [place], [place], [address] Phone: [phone]   Fax:  [phone]  Physical Therapy Treatment  Patient Details  Name: Kelly Massey MRN: [id number] Date of Birth: Jan 03, 1966 Referring Provider (PT): Edmonia Lynch, MD   Encounter Date: 04/27/2021   PT End of Session - 04/28/21 1229     Visit Number 3    Number of Visits 13    Date for PT Re-Evaluation 05/28/21    Authorization Type [employer] 2023, 60 combined PT/OT/Speech per calendar year    PT Start Time 1505    PT Stop Time 1546    PT Time Calculation (min) 41 min    Activity Tolerance Patient tolerated treatment well;Patient limited by pain    Behavior During Therapy [facility] for tasks assessed/performed             Past Medical History:  Diagnosis Date   Allergy    Anemia    Arthritis    knees - no meds   Colon polyps    HPP_ 2012   GERD (gastroesophageal reflux disease) 10/12/2010   EGD, positive H. pylori   HSV infection    History   Hyperlipidemia    ? no meds - diet controlled   Post-operative nausea and vomiting    Seasonal allergies     Past Surgical History:  Procedure Laterality Date   71 HOUR [place] STUDY N/A 07/25/2019   Procedure: 24 HOUR [place] STUDY;  Surgeon: Lavena Bullion, DO;  Location: [facility];  Service: Gastroenterology;  Laterality: N/A;   ABDOMINAL HYSTERECTOMY     BALLOON DILATION N/A 07/24/2012   Procedure: BALLOON DILATION;  Surgeon: Inda Castle, MD;  Location: [facility];  Service: Endoscopy;  Laterality: N/A;   BRAVO [place] STUDY N/A 07/24/2012   Procedure: BRAVO [place] STUDY;  Surgeon: Inda Castle, MD;  Location: [facility];  Service: Endoscopy;  Laterality: N/A;   COLONOSCOPY     DILATION AND CURETTAGE OF UTERUS     SAB   ESOPHAGEAL MANOMETRY N/A 07/25/2019   Procedure: ESOPHAGEAL MANOMETRY (EM);  Surgeon: Lavena Bullion, DO;  Location: [facility];  Service: Gastroenterology;  Laterality: N/A;    ESOPHAGOGASTRODUODENOSCOPY N/A 07/24/2012   Procedure: ESOPHAGOGASTRODUODENOSCOPY (EGD);  Surgeon: Inda Castle, MD;  Location: [facility];  Service: Endoscopy;  Laterality: N/A;   OVARIAN CYST REMOVAL  2004   laparotomy -left   ROBOTIC ASSISTED LAPAROSCOPIC LYSIS OF ADHESION N/A 03/21/2014   Procedure: ROBOTIC ASSISTED LAPAROSCOPIC EXTENSIVE LYSIS OF ADHESION (1 Hour);  Surgeon: Marvene Staff, MD;  Location: [facility];  Service: Gynecology;  Laterality: N/A;   ROBOTIC ASSISTED SALPINGO OOPHERECTOMY Left 03/21/2014   Procedure:  ROBOTIC ASSISTED LEFT OOPHORECTOMY;  Surgeon: Marvene Staff, MD;  Location: [facility];  Service: Gynecology;  Laterality: Left;   rotator cuff surgery  2017   SHOULDER ARTHROSCOPY Left    TENNIS ELBOW RELEASE/NIRSCHEL PROCEDURE Right 05/01/2015   Procedure: RIGHT ELBOW DEBRIDEMENT AND TENDON REPAIR;  Surgeon: [name], MD;  Location: [facility];  Service: Orthopedics;  Laterality: Right;   UPPER GASTROINTESTINAL ENDOSCOPY     WISDOM TOOTH EXTRACTION      There were no vitals filed for this visit.   Subjective Assessment - 04/27/21 1509     Subjective Patient had multiple vaccinations last week and had to miss her second appointmetn due to side effects. Patient reports feeling relatively well at arrival to PT. Pt reports comorbid  lower quarter pain is resolved presently. Patient reports no activities performed today have irritated R pectoral region/shoulder. She reports pain this past weekend following pushing shopping cart. Pt denies pain at arrival to PT.    Pertinent History Pt is a 56 year old female with primary complaint of R shoulder/pectoral pain. Symptoms began when patient was moving her Christmas tree and had fall (DOI: 04/03/21). Pt was holding Christmas tree box and landed onto R arm/shoulder. Pt had X-ray on shoulder and had no fracture or osseous abnormality. Pt reports difficulty performing pushing and lifting at work  following this injury. Patient reports some tingling her hands over the last 2 weeks. Patient reports pain along L paracervical region the first 2 nights after her fall; this "eased off." Patient reports pain with sneezing or coughing. She reports doing well with inhale/exhale. Pt did not have bruising or change in skin color. Patient is out of work now; has tentative return to work date on 04/29/21. Pt works at [employer] and performs physical labor e.g. lifting, pushing, shoveling.    Limitations Lifting;House hold activities   reaching cross-body   Diagnostic tests X-ray of R shoulder: Negative    Currently in Pain? No/denies    Pain Onset 1 to 4 weeks ago                 TREATMENT   Manual Therapy - for symptom modulation, soft tissue sensitivity and mobility as needed for shoulder complex ROM   STM R pectoralis major [emphasize on lateral portion] R shoulder PROM within pt tolerance [no significant limitation in flexion, abduction, or ER/IR at 90 deg abduction]     Therapeutic Exercise - for improved soft tissue flexibility and extensibility as needed for ROM, shoulder ROM as needed for reaching and self-care ADLs isometrics to promote soft tissue healing and promote tissue remodeling   Shoulder AAROM with wand; horizontal adduction/abduction in reclined position; 20x  Shoulder isometrics, internal rotation; 2x10 each, 5 sec hold   Pec major stretch (90 deg abd), doorway; 3x30sec   Column L in doorway; 2x10  Wall ball stabilization; x20 clockwise/counterclockwise with therapist demonstration and tactile cueing for avoidance of scapular protraction   Patient education: Discussed expected progress with PT, continued activity modification. Updated and reviewed home exercise program.     *not today* Shoulder isometrics, flexion; 2x10 each, 5 sec hold Shoulder AAROM with wand; abduction in reclined position; 20x    *NOT TODAY* Cold pack (unbilled) - for anti-inflammatory  and analgesic effect as needed for reduced pain and improved ability to participate in active PT intervention, draped over R shoulder and along R chest well in reclined position, x 5 minutes          ASSESSMENT Patient tolerates progression of shoulder horizontal adduction ROM and isometrics well without significant complaint of pain. She demonstrates normal PROM and is progressing well with attaining full AAROM. She has decreased sensitivity along mid-substance of sternocostal portion of pec major. Pt is still limited with pushing and activities involving resisted shoulder elevation. She is able to initiate AAROM into cross-body adduction without notable pain and flexion/IR isometrics with minimal discomfort. R shoulder AROM, R deltoid and RTC strength, R horizontal adduction strength, local nociceptive pain along R medial and lateral pectoral region. Patient will benefit from continued skilled therapeutic intervention to address the above deficits as needed for improved function and QoL.      PT Short Term Goals - 04/17/21 1124       PT SHORT  TERM GOAL #1   Title Pt will be independent and 100% compliant with HEP and activity modification in order to improve strength and decrease pain in order to improve pain-free function at home and work.    Baseline 04/16/21: no HEP initiated today, discussed activity mod.    Time 3    Period Weeks    Status New    Target Date 05/08/21      PT SHORT TERM GOAL #2   Title Patient will have shoulder AROM symmetrical to opposite UE without reproduction of pain as needed for functional reaching, overhead work, self-care, and recovery of function    Baseline 04/16/21: Shoulder AROM R/L Flexion 167/156, ABD 154/165, ER 82/86 (pain with shoulder ABD and ER).    Time 4    Period Weeks    Status New    Target Date 05/14/21               PT Long Term Goals - 04/16/21 1431       PT LONG TERM GOAL #1   Title Patient will demonstrate improved function as  evidenced by a score of 71 on FOTO measure for full participation in activities at home and in the community.    Baseline 04/16/21: 55    Time 6    Period Weeks    Status New    Target Date 05/28/21      PT LONG TERM GOAL #2   Title Pt will decrease worst pain as reported on NPRS by at least 3 points in order to demonstrate clinically significant reduction in pain.    Baseline 04/16/21: Pain 8/10 at worst    Time 6    Period Weeks    Status New    Target Date 05/28/21      PT LONG TERM GOAL #3   Title Patient will demonstrate box lift from floor to waist and box transfer from surface to surface simulating work duties with load usually managed in working environment with sound body mechanics and no reproduction of back pain    Baseline 04/16/21: Patient is out of work at this time and unable to perform full physical work duties (lifting, pushing, pulling)    Time 6    Period Weeks    Target Date 05/28/21      PT LONG TERM GOAL #4   Title Patient will have MMT 4+/5 or greater for all R upper limb muscles assessed without reproduction of symptoms indicative of improved strength and improved tolerance of loading affected tissues as needed for performance of lifting/pushing/pulling duties at work    Baseline 04/16/21: MMT R Shoulder flexion 4*, ABD 4, ER 4, IR 4, horizontal adduction 4*    Time 6    Period Weeks    Status New    Target Date 05/28/21                   Plan - 04/28/21 1300     Clinical Impression Statement Patient tolerates progression of shoulder horizontal adduction ROM and isometrics well without significant complaint of pain. She demonstrates normal PROM and is progressing well with attaining full AAROM. She has decreased sensitivity along mid-substance of sternocostal portion of pec major. Pt is still limited with pushing and activities involving resisted shoulder elevation. She is able to initiate AAROM into cross-body adduction without notable pain and  flexion/IR isometrics with minimal discomfort. R shoulder AROM, R deltoid and RTC strength, R horizontal adduction strength, local nociceptive pain along R  medial and lateral pectoral region. Patient will benefit from continued skilled therapeutic intervention to address the above deficits as needed for improved function and QoL.    Personal Factors and Comorbidities Past/Current Experience;Comorbidity 3+;Profession   pt's job requires heavy physical labor   Comorbidities GERD, Barret's esophagus, high cholesterol, leukopenia    Examination-Activity Limitations Lift;Reach Overhead;Bed Mobility;Carry    Examination-Participation Restrictions Occupation;Community Activity    Stability/Clinical Decision Making Stable/Uncomplicated    Rehab Potential Good    Clinical Impairments Affecting Rehab Potential Positive: Age. Motivation. Active lifestyle. Negative: heavy physical work required for job (pushing, pulling, lifting, shoveling)    PT Frequency 2x / week    PT Duration 6 weeks    PT Treatment/Interventions Electrical Stimulation;Therapeutic activities;Therapeutic exercise;Neuromuscular re-education;Manual techniques;Patient/family education;Dry needling;Moist Heat;Cryotherapy    PT Next Visit Plan Activity modification and manual therapy to improve sensitization and soft tissue mobility, gradual restoration of shoulder girdle ROM, progressive loading of affected tissues once able and gradual recovery of function    PT Home Exercise Plan Access Code [email or web address]    Consulted and Agree with Plan of Care Patient             Patient will benefit from skilled therapeutic intervention in order to improve the following deficits and impairments:  Decreased range of motion, Decreased strength, Pain, Impaired flexibility, Impaired UE functional use  Visit Diagnosis: Acute pain of right shoulder  Acute chest wall pain  Muscle weakness (generalized)     Problem List Patient Active Problem List    Diagnosis Date Noted   Right hip pain 03/01/2021   Plantar fasciitis 01/05/2021   Hyperglycemia 07/23/2020   Sciatica 02/23/2020   Tinnitus 12/02/2019   Belching    Globus sensation    Hot flashes 07/01/2019   Loss of taste 02/05/2019   Headache 08/28/2018   Exposure to COVID-19 virus 08/24/2018   Chest pain 10/30/2017   Witnessed apneic spells 10/31/2016   Other neutropenia ([place]) 08/10/2016   Vertigo 07/28/2016   Health care maintenance 11/21/2014   History of colonic polyps 11/21/2014   Dysphagia 11/19/2014   Trochanteric bursitis of left hip 10/08/2014   URI (upper respiratory infection) 02/10/2014   Pelvic pain in female 12/25/2013   Personal history of ovarian cyst 12/25/2013   Change in bowel movement 11/11/2013   breast tenderness 09/29/2013   Dysphagia, unspecified(787.20) 06/12/2012   Barrett's esophagus 06/12/2012   Environmental allergies 02/13/2012   Enlarged thyroid 02/13/2012   GERD (gastroesophageal reflux disease) 02/08/2012   Leukopenia 02/08/2012   Fatigue 02/08/2012   Hypercholesterolemia 02/08/2012   Valentina Gu, PT, DPT #[id number]  Eilleen Kempf, PT 04/28/2021, 1:01 PM  [facility] [address] [place], [place], [address] Phone: [phone]   Fax:  [phone]  Name: DAFNEY FARLER MRN: [id number] Date of Birth: [birth year]

## 2021-04-28 ENCOUNTER — Encounter: Payer: Self-pay | Admitting: Physical Therapy

## 2021-04-29 ENCOUNTER — Ambulatory Visit: Payer: 59 | Admitting: Physical Therapy

## 2021-04-29 ENCOUNTER — Encounter: Payer: Self-pay | Admitting: Physical Therapy

## 2021-04-29 ENCOUNTER — Other Ambulatory Visit: Payer: Self-pay

## 2021-04-29 DIAGNOSIS — R0789 Other chest pain: Secondary | ICD-10-CM

## 2021-04-29 DIAGNOSIS — M6281 Muscle weakness (generalized): Secondary | ICD-10-CM

## 2021-04-29 DIAGNOSIS — M25511 Pain in right shoulder: Secondary | ICD-10-CM | POA: Diagnosis not present

## 2021-04-29 NOTE — Therapy (Signed)
[facility] [address] [place], [place], [address] Phone: [phone]   Fax:  [phone]  Physical Therapy Treatment  Patient Details  Name: Kelly Massey MRN: [id number] Date of Birth: Oct 03, 1965 Referring Provider (PT): Edmonia Lynch, MD   Encounter Date: 04/29/2021   PT End of Session - 04/30/21 1541     Visit Number 4    Number of Visits 13    Date for PT Re-Evaluation 05/28/21    Authorization Type [employer] 2023, 60 combined PT/OT/Speech per calendar year    Progress Note Due on Visit 10    PT Start Time 1109    PT Stop Time 1146    PT Time Calculation (min) 37 min    Activity Tolerance Patient tolerated treatment well;Patient limited by pain    Behavior During Therapy [facility] for tasks assessed/performed             Past Medical History:  Diagnosis Date   Allergy    Anemia    Arthritis    knees - no meds   Colon polyps    HPP_ 2012   GERD (gastroesophageal reflux disease) 10/12/2010   EGD, positive H. pylori   HSV infection    History   Hyperlipidemia    ? no meds - diet controlled   Post-operative nausea and vomiting    Seasonal allergies     Past Surgical History:  Procedure Laterality Date   54 HOUR [place] STUDY N/A 07/25/2019   Procedure: 24 HOUR [place] STUDY;  Surgeon: Lavena Bullion, DO;  Location: [facility];  Service: Gastroenterology;  Laterality: N/A;   ABDOMINAL HYSTERECTOMY     BALLOON DILATION N/A 07/24/2012   Procedure: BALLOON DILATION;  Surgeon: Inda Castle, MD;  Location: [facility];  Service: Endoscopy;  Laterality: N/A;   BRAVO [place] STUDY N/A 07/24/2012   Procedure: BRAVO [place] STUDY;  Surgeon: Inda Castle, MD;  Location: [facility];  Service: Endoscopy;  Laterality: N/A;   COLONOSCOPY     DILATION AND CURETTAGE OF UTERUS     SAB   ESOPHAGEAL MANOMETRY N/A 07/25/2019   Procedure: ESOPHAGEAL MANOMETRY (EM);  Surgeon: Lavena Bullion, DO;  Location: [facility];  Service:  Gastroenterology;  Laterality: N/A;   ESOPHAGOGASTRODUODENOSCOPY N/A 07/24/2012   Procedure: ESOPHAGOGASTRODUODENOSCOPY (EGD);  Surgeon: Inda Castle, MD;  Location: [facility];  Service: Endoscopy;  Laterality: N/A;   OVARIAN CYST REMOVAL  2004   laparotomy -left   ROBOTIC ASSISTED LAPAROSCOPIC LYSIS OF ADHESION N/A 03/21/2014   Procedure: ROBOTIC ASSISTED LAPAROSCOPIC EXTENSIVE LYSIS OF ADHESION (1 Hour);  Surgeon: Marvene Staff, MD;  Location: [facility];  Service: Gynecology;  Laterality: N/A;   ROBOTIC ASSISTED SALPINGO OOPHERECTOMY Left 03/21/2014   Procedure:  ROBOTIC ASSISTED LEFT OOPHORECTOMY;  Surgeon: Marvene Staff, MD;  Location: [facility];  Service: Gynecology;  Laterality: Left;   rotator cuff surgery  2017   SHOULDER ARTHROSCOPY Left    TENNIS ELBOW RELEASE/NIRSCHEL PROCEDURE Right 05/01/2015   Procedure: RIGHT ELBOW DEBRIDEMENT AND TENDON REPAIR;  Surgeon: [name], MD;  Location: [place];  Service: Orthopedics;  Laterality: Right;   UPPER GASTROINTESTINAL ENDOSCOPY     WISDOM TOOTH EXTRACTION      There were no vitals filed for this visit.   Subjective Assessment - 04/29/21 1110     Subjective Patient reports no pain at arrival to PT in her shoulder/chest. Patient reports comorbid R-sided low back pain. Patient reports  doing well with updated HEP. Patient reports tolerating last visit well.    Pertinent History Pt is a 56 year old female with primary complaint of R shoulder/pectoral pain. Symptoms began when patient was moving her Christmas tree and had fall (DOI: 04/03/21). Pt was holding Christmas tree box and landed onto R arm/shoulder. Pt had X-ray on shoulder and had no fracture or osseous abnormality. Pt reports difficulty performing pushing and lifting at work following this injury. Patient reports some tingling her hands over the last 2 weeks. Patient reports pain along L paracervical region the first 2 nights after her fall; this  "eased off." Patient reports pain with sneezing or coughing. She reports doing well with inhale/exhale. Pt did not have bruising or change in skin color. Patient is out of work now; has tentative return to work date on 04/29/21. Pt works at [employer] and performs physical labor e.g. lifting, pushing, shoveling.    Limitations Lifting;House hold activities   reaching cross-body   Diagnostic tests X-ray of R shoulder: Negative    Patient Stated Goals Able to get back into 5 mi/day walking regimen; decreased pain    Pain Onset 1 to 4 weeks ago              TREATMENT   Manual Therapy - for symptom modulation, soft tissue sensitivity and mobility as needed for shoulder complex ROM   STM R pectoralis major [emphasize on mid-substance sternocostal portion] R shoulder PROM within pt tolerance [no significant limitation in flexion, abduction, or ER/IR at 90 deg abduction]     Therapeutic Exercise - for improved soft tissue flexibility and extensibility as needed for ROM, shoulder ROM as needed for reaching and self-care ADLs isometrics to promote soft tissue healing and promote tissue remodeling  Shoulder reactive isometrics, with Red Theraband; internal rotation; 2x10   Pec major stretch (90 deg abd), doorway; 3x30sec   Column L in doorway; 2x10   Wall ball stabilization; x20 clockwise/counterclockwise with therapist demonstration and tactile cueing for avoidance of scapular protraction  Shoulder tap, alternating; with upper limbs CKC on wall with slight forward lean; 2x10 alternating L/R        *not today* Shoulder AAROM with wand; horizontal adduction/abduction in reclined position; 20x Shoulder isometrics, flexion; 2x10 each, 5 sec hold Shoulder AAROM with wand; abduction in reclined position; 20x     *NOT TODAY* Cold pack (unbilled) - for anti-inflammatory and analgesic effect as needed for reduced pain and improved ability to participate in active PT intervention, draped over R  shoulder and along R chest well in reclined position, x 5 minutes          ASSESSMENT Patient tolerates progression of pectoral isometrics and introduction of closed-chain loading well (simulating demands of pushing/closed-chain pectoral loading) without significant pain. She demonstrates normalizing pectoral flexibility and tolerates full shoulder ROM. Pt is making good progress to date and is appropriate for continued progression of activity in clinic as tolerated prior to heavy lifting/pushing/pulling at intensity of work demands. Pt has remaining deficits in R deltoid and RTC strength, R horizontal adduction strength, moderate intermittent local nociceptive pain along R medial and lateral pectoral region. Patient will benefit from continued skilled therapeutic intervention to address the above deficits as needed for improved function and QoL.      PT Short Term Goals - 04/17/21 1124       PT SHORT TERM GOAL #1   Title Pt will be independent and 100% compliant with HEP and activity modification in order  to improve strength and decrease pain in order to improve pain-free function at home and work.    Baseline 04/16/21: no HEP initiated today, discussed activity mod.    Time 3    Period Weeks    Status New    Target Date 05/08/21      PT SHORT TERM GOAL #2   Title Patient will have shoulder AROM symmetrical to opposite UE without reproduction of pain as needed for functional reaching, overhead work, self-care, and recovery of function    Baseline 04/16/21: Shoulder AROM R/L Flexion 167/156, ABD 154/165, ER 82/86 (pain with shoulder ABD and ER).    Time 4    Period Weeks    Status New    Target Date 05/14/21               PT Long Term Goals - 04/16/21 1431       PT LONG TERM GOAL #1   Title Patient will demonstrate improved function as evidenced by a score of 71 on FOTO measure for full participation in activities at home and in the community.    Baseline 04/16/21: 55    Time 6     Period Weeks    Status New    Target Date 05/28/21      PT LONG TERM GOAL #2   Title Pt will decrease worst pain as reported on NPRS by at least 3 points in order to demonstrate clinically significant reduction in pain.    Baseline 04/16/21: Pain 8/10 at worst    Time 6    Period Weeks    Status New    Target Date 05/28/21      PT LONG TERM GOAL #3   Title Patient will demonstrate box lift from floor to waist and box transfer from surface to surface simulating work duties with load usually managed in working environment with sound body mechanics and no reproduction of back pain    Baseline 04/16/21: Patient is out of work at this time and unable to perform full physical work duties (lifting, pushing, pulling)    Time 6    Period Weeks    Target Date 05/28/21      PT LONG TERM GOAL #4   Title Patient will have MMT 4+/5 or greater for all R upper limb muscles assessed without reproduction of symptoms indicative of improved strength and improved tolerance of loading affected tissues as needed for performance of lifting/pushing/pulling duties at work    Baseline 04/16/21: MMT R Shoulder flexion 4*, ABD 4, ER 4, IR 4, horizontal adduction 4*    Time 6    Period Weeks    Status New    Target Date 05/28/21                   Plan - 04/30/21 1549     Clinical Impression Statement Patient tolerates progression of pectoral isometrics and introduction of closed-chain loading well (simulating demands of pushing/closed-chain pectoral loading) without significant pain. She demonstrates normalizing pectoral flexibility and tolerates full shoulder ROM. Pt is making good progress to date and is appropriate for continued progression of activity in clinic as tolerated prior to heavy lifting/pushing/pulling at intensity of work demands. Pt has remaining deficits in R deltoid and RTC strength, R horizontal adduction strength, moderate intermittent local nociceptive pain along R medial and lateral  pectoral region. Patient will benefit from continued skilled therapeutic intervention to address the above deficits as needed for improved function and QoL.  Personal Factors and Comorbidities Past/Current Experience;Comorbidity 3+;Profession   pt's job requires heavy physical labor   Comorbidities GERD, Barret's esophagus, high cholesterol, leukopenia    Examination-Activity Limitations Lift;Reach Overhead;Bed Mobility;Carry    Examination-Participation Restrictions Occupation;Community Activity    Stability/Clinical Decision Making Stable/Uncomplicated    Rehab Potential Good    Clinical Impairments Affecting Rehab Potential Positive: Age. Motivation. Active lifestyle. Negative: heavy physical work required for job (pushing, pulling, lifting, shoveling)    PT Frequency 2x / week    PT Duration 6 weeks    PT Treatment/Interventions Electrical Stimulation;Therapeutic activities;Therapeutic exercise;Neuromuscular re-education;Manual techniques;Patient/family education;Dry needling;Moist Heat;Cryotherapy    PT Next Visit Plan Activity modification and manual therapy to improve sensitization and soft tissue mobility, gradual restoration of shoulder girdle ROM, progressive loading of affected tissues once able and gradual recovery of function    PT Home Exercise Plan Access Code [email or web address]    Consulted and Agree with Plan of Care Patient             Patient will benefit from skilled therapeutic intervention in order to improve the following deficits and impairments:  Decreased range of motion, Decreased strength, Pain, Impaired flexibility, Impaired UE functional use  Visit Diagnosis: Acute pain of right shoulder  Acute chest wall pain  Muscle weakness (generalized)     Problem List Patient Active Problem List   Diagnosis Date Noted   Right hip pain 03/01/2021   Plantar fasciitis 01/05/2021   Hyperglycemia 07/23/2020   Sciatica 02/23/2020   Tinnitus 12/02/2019   Belching     Globus sensation    Hot flashes 07/01/2019   Loss of taste 02/05/2019   Headache 08/28/2018   Exposure to COVID-19 virus 08/24/2018   Chest pain 10/30/2017   Witnessed apneic spells 10/31/2016   Other neutropenia ([place]) 08/10/2016   Vertigo 07/28/2016   Health care maintenance 11/21/2014   History of colonic polyps 11/21/2014   Dysphagia 11/19/2014   Trochanteric bursitis of left hip 10/08/2014   URI (upper respiratory infection) 02/10/2014   Pelvic pain in female 12/25/2013   Personal history of ovarian cyst 12/25/2013   Change in bowel movement 11/11/2013   breast tenderness 09/29/2013   Dysphagia, unspecified(787.20) 06/12/2012   Barrett's esophagus 06/12/2012   Environmental allergies 02/13/2012   Enlarged thyroid 02/13/2012   GERD (gastroesophageal reflux disease) 02/08/2012   Leukopenia 02/08/2012   Fatigue 02/08/2012   Hypercholesterolemia 02/08/2012   Valentina Gu, PT, DPT #[id number]  Eilleen Kempf, PT 04/30/2021, 3:49 PM  [facility] [address]. [place], [place], [address] Phone: [phone]   Fax:  [phone]  Name: Kelly Massey MRN: [id number] Date of Birth: 1965-09-17

## 2021-05-04 ENCOUNTER — Other Ambulatory Visit: Payer: Self-pay

## 2021-05-04 ENCOUNTER — Ambulatory Visit: Payer: 59 | Admitting: Physical Therapy

## 2021-05-04 DIAGNOSIS — M25511 Pain in right shoulder: Secondary | ICD-10-CM

## 2021-05-04 DIAGNOSIS — R0789 Other chest pain: Secondary | ICD-10-CM

## 2021-05-04 DIAGNOSIS — M6281 Muscle weakness (generalized): Secondary | ICD-10-CM

## 2021-05-04 NOTE — Therapy (Signed)
[facility] [address] [place], [place], [address] Phone: [phone]   Fax:  [phone]  Physical Therapy Treatment  Patient Details  Name: Kelly Massey MRN: [id number] Date of Birth: 1966-01-18 Referring Provider (PT): Edmonia Lynch, MD   Encounter Date: 05/04/2021   PT End of Session - 05/04/21 1121     Visit Number 5    Number of Visits 13    Date for PT Re-Evaluation 05/28/21    Authorization Type [employer] 2023, 60 combined PT/OT/Speech per calendar year    Progress Note Due on Visit 10    PT Start Time 1102    PT Stop Time 1144    PT Time Calculation (min) 42 min    Activity Tolerance Patient tolerated treatment well;Patient limited by pain    Behavior During Therapy [facility] for tasks assessed/performed             Past Medical History:  Diagnosis Date   Allergy    Anemia    Arthritis    knees - no meds   Colon polyps    HPP_ 2012   GERD (gastroesophageal reflux disease) 10/12/2010   EGD, positive H. pylori   HSV infection    History   Hyperlipidemia    ? no meds - diet controlled   Post-operative nausea and vomiting    Seasonal allergies     Past Surgical History:  Procedure Laterality Date   51 HOUR [place] STUDY N/A 07/25/2019   Procedure: 24 HOUR [place] STUDY;  Surgeon: Lavena Bullion, DO;  Location: [facility];  Service: Gastroenterology;  Laterality: N/A;   ABDOMINAL HYSTERECTOMY     BALLOON DILATION N/A 07/24/2012   Procedure: BALLOON DILATION;  Surgeon: Inda Castle, MD;  Location: [facility];  Service: Endoscopy;  Laterality: N/A;   BRAVO [place] STUDY N/A 07/24/2012   Procedure: BRAVO [place] STUDY;  Surgeon: Inda Castle, MD;  Location: [facility];  Service: Endoscopy;  Laterality: N/A;   COLONOSCOPY     DILATION AND CURETTAGE OF UTERUS     SAB   ESOPHAGEAL MANOMETRY N/A 07/25/2019   Procedure: ESOPHAGEAL MANOMETRY (EM);  Surgeon: Lavena Bullion, DO;  Location: [facility];  Service:  Gastroenterology;  Laterality: N/A;   ESOPHAGOGASTRODUODENOSCOPY N/A 07/24/2012   Procedure: ESOPHAGOGASTRODUODENOSCOPY (EGD);  Surgeon: Inda Castle, MD;  Location: [facility];  Service: Endoscopy;  Laterality: N/A;   OVARIAN CYST REMOVAL  2004   laparotomy -left   ROBOTIC ASSISTED LAPAROSCOPIC LYSIS OF ADHESION N/A 03/21/2014   Procedure: ROBOTIC ASSISTED LAPAROSCOPIC EXTENSIVE LYSIS OF ADHESION (1 Hour);  Surgeon: Marvene Staff, MD;  Location: [facility];  Service: Gynecology;  Laterality: N/A;   ROBOTIC ASSISTED SALPINGO OOPHERECTOMY Left 03/21/2014   Procedure:  ROBOTIC ASSISTED LEFT OOPHORECTOMY;  Surgeon: Marvene Staff, MD;  Location: [facility];  Service: Gynecology;  Laterality: Left;   rotator cuff surgery  2017   SHOULDER ARTHROSCOPY Left    TENNIS ELBOW RELEASE/NIRSCHEL PROCEDURE Right 05/01/2015   Procedure: RIGHT ELBOW DEBRIDEMENT AND TENDON REPAIR;  Surgeon: [name], MD;  Location: [facility];  Service: Orthopedics;  Laterality: Right;   UPPER GASTROINTESTINAL ENDOSCOPY     WISDOM TOOTH EXTRACTION      There were no vitals filed for this visit.   Subjective Assessment - 05/04/21 1122     Subjective Patient reports no notable pain at arrival to PT. She reports doing well following exercises last session. Pt reports doing  better with sitting up in bed; no major recent flare-up.    Pertinent History Pt is a 56 year old female with primary complaint of R shoulder/pectoral pain. Symptoms began when patient was moving her Christmas tree and had fall (DOI: 04/03/21). Pt was holding Christmas tree box and landed onto R arm/shoulder. Pt had X-ray on shoulder and had no fracture or osseous abnormality. Pt reports difficulty performing pushing and lifting at work following this injury. Patient reports some tingling her hands over the last 2 weeks. Patient reports pain along L paracervical region the first 2 nights after her fall; this "eased off." Patient  reports pain with sneezing or coughing. She reports doing well with inhale/exhale. Pt did not have bruising or change in skin color. Patient is out of work now; has tentative return to work date on 04/29/21. Pt works at [employer] and performs physical labor e.g. lifting, pushing, shoveling.    Limitations Lifting;House hold activities   reaching cross-body   Diagnostic tests X-ray of R shoulder: Negative    Patient Stated Goals Able to get back into 5 mi/day walking regimen; decreased pain    Pain Onset 1 to 4 weeks ago               TREATMENT   Manual Therapy - for symptom modulation, soft tissue sensitivity and mobility as needed for shoulder complex ROM   STM R pectoralis major  R shoulder PROM within pt tolerance Glenohumeral joint mobilization, gr II-III inferior (grade II for pain control, grade III for joint mobility)     Therapeutic Exercise - for improved soft tissue flexibility and extensibility as needed for ROM, shoulder ROM as needed for reaching and self-care ADLs isometrics to promote soft tissue healing and promote tissue remodeling  Upper body ergometer, 2 minutes forward, 2 minutes backward - for tissue warm-up to improve muscle performance, improved soft tissue mobility/extensibility - 3 minutes unbilled, interval subjective gathered during this time   [place]; shoulder complex abduction; x 2 minutes, seated  Shoulder reactive isometrics, with Green Theraband; internal rotation; 2x10   Wall ball stabilization; 2x20 clockwise/counterclockwise with therapist demonstration for avoidance of scapular protraction   Shoulder tap, alternating; with upper limbs CKC on wall with slight forward lean; 2x10 alternating L/R  Push/pull sled; no weight added; 2x D/B, 35 feet (half of hallway)        *not today* Pec major stretch (90 deg abd), doorway; 3x30sec Column L in doorway; 2x10 Shoulder AAROM with wand; horizontal adduction/abduction in reclined position;  20x Shoulder isometrics, flexion; 2x10 each, 5 sec hold Shoulder AAROM with wand; abduction in reclined position; 20x     *NOT TODAY* Cold pack (unbilled) - for anti-inflammatory and analgesic effect as needed for reduced pain and improved ability to participate in active PT intervention, draped over R shoulder and along R chest well in reclined position, x 5 minutes          ASSESSMENT Patient has fleeting pain today with end-range shoulder complex abduction that is improved following glenohumeral joint mobilization. Pt does not have significant tenderness to palpation along sternocostal region, deltopectoral groove, or R pectoral musculature. She demonstrates full PROM today and is able to continue with gradual introduction of loading pectoral mm without onset of symptoms today. Pt is making excellent progress and will benefit from continued gradual exposure to work-specific activities prior to return to full work duties. Pt has remaining deficits in R deltoid and RTC strength, R horizontal adduction strength, moderate intermittent local nociceptive  pain along R medial and lateral pectoral region. Patient will benefit from continued skilled therapeutic intervention to address the above deficits as needed for improved function and QoL.        PT Short Term Goals - 04/17/21 1124       PT SHORT TERM GOAL #1   Title Pt will be independent and 100% compliant with HEP and activity modification in order to improve strength and decrease pain in order to improve pain-free function at home and work.    Baseline 04/16/21: no HEP initiated today, discussed activity mod.    Time 3    Period Weeks    Status New    Target Date 05/08/21      PT SHORT TERM GOAL #2   Title Patient will have shoulder AROM symmetrical to opposite UE without reproduction of pain as needed for functional reaching, overhead work, self-care, and recovery of function    Baseline 04/16/21: Shoulder AROM R/L Flexion 167/156, ABD  154/165, ER 82/86 (pain with shoulder ABD and ER).    Time 4    Period Weeks    Status New    Target Date 05/14/21               PT Long Term Goals - 04/16/21 1431       PT LONG TERM GOAL #1   Title Patient will demonstrate improved function as evidenced by a score of 71 on FOTO measure for full participation in activities at home and in the community.    Baseline 04/16/21: 55    Time 6    Period Weeks    Status New    Target Date 05/28/21      PT LONG TERM GOAL #2   Title Pt will decrease worst pain as reported on NPRS by at least 3 points in order to demonstrate clinically significant reduction in pain.    Baseline 04/16/21: Pain 8/10 at worst    Time 6    Period Weeks    Status New    Target Date 05/28/21      PT LONG TERM GOAL #3   Title Patient will demonstrate box lift from floor to waist and box transfer from surface to surface simulating work duties with load usually managed in working environment with sound body mechanics and no reproduction of back pain    Baseline 04/16/21: Patient is out of work at this time and unable to perform full physical work duties (lifting, pushing, pulling)    Time 6    Period Weeks    Target Date 05/28/21      PT LONG TERM GOAL #4   Title Patient will have MMT 4+/5 or greater for all R upper limb muscles assessed without reproduction of symptoms indicative of improved strength and improved tolerance of loading affected tissues as needed for performance of lifting/pushing/pulling duties at work    Baseline 04/16/21: MMT R Shoulder flexion 4*, ABD 4, ER 4, IR 4, horizontal adduction 4*    Time 6    Period Weeks    Status New    Target Date 05/28/21                   Plan - 05/05/21 1337     Clinical Impression Statement Patient has fleeting pain today with end-range shoulder complex abduction that is improved following glenohumeral joint mobilization. Pt does not have significant tenderness to palpation along sternocostal  region, deltopectoral groove, or R pectoral musculature. She demonstrates full  PROM today and is able to continue with gradual introduction of loading pectoral mm without onset of symptoms today. Pt is making excellent progress and will benefit from continued gradual exposure to work-specific activities prior to return to full work duties. Pt has remaining deficits in R deltoid and RTC strength, R horizontal adduction strength, moderate intermittent local nociceptive pain along R medial and lateral pectoral region. Patient will benefit from continued skilled therapeutic intervention to address the above deficits as needed for improved function and QoL.    Personal Factors and Comorbidities Past/Current Experience;Comorbidity 3+;Profession   pt's job requires heavy physical labor   Comorbidities GERD, Barret's esophagus, high cholesterol, leukopenia    Examination-Activity Limitations Lift;Reach Overhead;Bed Mobility;Carry    Examination-Participation Restrictions Occupation;Community Activity    Stability/Clinical Decision Making Stable/Uncomplicated    Rehab Potential Good    Clinical Impairments Affecting Rehab Potential Positive: Age. Motivation. Active lifestyle. Negative: heavy physical work required for job (pushing, pulling, lifting, shoveling)    PT Frequency 2x / week    PT Duration 6 weeks    PT Treatment/Interventions Electrical Stimulation;Therapeutic activities;Therapeutic exercise;Neuromuscular re-education;Manual techniques;Patient/family education;Dry needling;Moist Heat;Cryotherapy    PT Next Visit Plan Activity modification and manual therapy to improve sensitization and soft tissue mobility, gradual restoration of shoulder girdle ROM, progressive loading of affected tissues once able and gradual recovery of function    PT Home Exercise Plan Access Code [email or web address]    Consulted and Agree with Plan of Care Patient             Patient will benefit from skilled therapeutic  intervention in order to improve the following deficits and impairments:  Decreased range of motion, Decreased strength, Pain, Impaired flexibility, Impaired UE functional use  Visit Diagnosis: Acute pain of right shoulder  Acute chest wall pain  Muscle weakness (generalized)     Problem List Patient Active Problem List   Diagnosis Date Noted   Right hip pain 03/01/2021   Plantar fasciitis 01/05/2021   Hyperglycemia 07/23/2020   Sciatica 02/23/2020   Tinnitus 12/02/2019   Belching    Globus sensation    Hot flashes 07/01/2019   Loss of taste 02/05/2019   Headache 08/28/2018   Exposure to COVID-19 virus 08/24/2018   Chest pain 10/30/2017   Witnessed apneic spells 10/31/2016   Other neutropenia ([place]) 08/10/2016   Vertigo 07/28/2016   Health care maintenance 11/21/2014   History of colonic polyps 11/21/2014   Dysphagia 11/19/2014   Trochanteric bursitis of left hip 10/08/2014   URI (upper respiratory infection) 02/10/2014   Pelvic pain in female 12/25/2013   Personal history of ovarian cyst 12/25/2013   Change in bowel movement 11/11/2013   breast tenderness 09/29/2013   Dysphagia, unspecified(787.20) 06/12/2012   Barrett's esophagus 06/12/2012   Environmental allergies 02/13/2012   Enlarged thyroid 02/13/2012   GERD (gastroesophageal reflux disease) 02/08/2012   Leukopenia 02/08/2012   Fatigue 02/08/2012   Hypercholesterolemia 02/08/2012   Valentina Gu, PT, DPT #[id number]  Eilleen Kempf, PT 05/05/2021, 1:37 PM  [facility] [address] [place], [place], [address] Phone: [phone]   Fax:  [phone]  Name: Kelly Massey MRN: [id number] Date of Birth: 1965-06-08

## 2021-05-05 ENCOUNTER — Encounter: Payer: Self-pay | Admitting: Physical Therapy

## 2021-05-06 ENCOUNTER — Encounter: Payer: Self-pay | Admitting: Physical Therapy

## 2021-05-06 ENCOUNTER — Ambulatory Visit: Payer: 59 | Attending: Podiatry | Admitting: Physical Therapy

## 2021-05-06 ENCOUNTER — Other Ambulatory Visit: Payer: Self-pay

## 2021-05-06 DIAGNOSIS — M6281 Muscle weakness (generalized): Secondary | ICD-10-CM

## 2021-05-06 DIAGNOSIS — M25511 Pain in right shoulder: Secondary | ICD-10-CM

## 2021-05-06 DIAGNOSIS — R0789 Other chest pain: Secondary | ICD-10-CM | POA: Diagnosis present

## 2021-05-06 NOTE — Therapy (Signed)
[facility] [address] [place], [place], [address] Phone: [phone]   Fax:  [phone]  Physical Therapy Treatment  Patient Details  Name: Kelly Massey MRN: [id number] Date of Birth: 1965-10-08 Referring Provider (PT): Edmonia Lynch, MD   Encounter Date: 05/06/2021   PT End of Session - 05/06/21 1107     Visit Number 6    Number of Visits 13    Date for PT Re-Evaluation 05/28/21    Authorization Type [employer] 2023, 60 combined PT/OT/Speech per calendar year    Progress Note Due on Visit 10    PT Start Time 1102    PT Stop Time 1144    PT Time Calculation (min) 42 min    Activity Tolerance Patient tolerated treatment well;Patient limited by pain    Behavior During Therapy [facility] for tasks assessed/performed             Past Medical History:  Diagnosis Date   Allergy    Anemia    Arthritis    knees - no meds   Colon polyps    HPP_ 2012   GERD (gastroesophageal reflux disease) 10/12/2010   EGD, positive H. pylori   HSV infection    History   Hyperlipidemia    ? no meds - diet controlled   Post-operative nausea and vomiting    Seasonal allergies     Past Surgical History:  Procedure Laterality Date   64 HOUR [place] STUDY N/A 07/25/2019   Procedure: 24 HOUR [place] STUDY;  Surgeon: Lavena Bullion, DO;  Location: [facility];  Service: Gastroenterology;  Laterality: N/A;   ABDOMINAL HYSTERECTOMY     BALLOON DILATION N/A 07/24/2012   Procedure: BALLOON DILATION;  Surgeon: Inda Castle, MD;  Location: [facility];  Service: Endoscopy;  Laterality: N/A;   BRAVO [place] STUDY N/A 07/24/2012   Procedure: BRAVO [place] STUDY;  Surgeon: Inda Castle, MD;  Location: [facility];  Service: Endoscopy;  Laterality: N/A;   COLONOSCOPY     DILATION AND CURETTAGE OF UTERUS     SAB   ESOPHAGEAL MANOMETRY N/A 07/25/2019   Procedure: ESOPHAGEAL MANOMETRY (EM);  Surgeon: Lavena Bullion, DO;  Location: [facility];  Service:  Gastroenterology;  Laterality: N/A;   ESOPHAGOGASTRODUODENOSCOPY N/A 07/24/2012   Procedure: ESOPHAGOGASTRODUODENOSCOPY (EGD);  Surgeon: Inda Castle, MD;  Location: [facility];  Service: Endoscopy;  Laterality: N/A;   OVARIAN CYST REMOVAL  2004   laparotomy -left   ROBOTIC ASSISTED LAPAROSCOPIC LYSIS OF ADHESION N/A 03/21/2014   Procedure: ROBOTIC ASSISTED LAPAROSCOPIC EXTENSIVE LYSIS OF ADHESION (1 Hour);  Surgeon: Marvene Staff, MD;  Location: [facility];  Service: Gynecology;  Laterality: N/A;   ROBOTIC ASSISTED [place] OOPHERECTOMY Left 03/21/2014   Procedure:  ROBOTIC ASSISTED LEFT OOPHORECTOMY;  Surgeon: Marvene Staff, MD;  Location: [facility];  Service: Gynecology;  Laterality: Left;   rotator cuff surgery  2017   SHOULDER ARTHROSCOPY Left    TENNIS ELBOW RELEASE/NIRSCHEL PROCEDURE Right 05/01/2015   Procedure: RIGHT ELBOW DEBRIDEMENT AND TENDON REPAIR;  Surgeon: [name], MD;  Location: [facility];  Service: Orthopedics;  Laterality: Right;   UPPER GASTROINTESTINAL ENDOSCOPY     WISDOM TOOTH EXTRACTION      There were no vitals filed for this visit.   Subjective Assessment - 05/06/21 1131     Subjective Patient denies pain at arrival today. She reports tolerating moving grocery shopping cart well recently and denies any significant soreness  or symptoms following progression of activities in clinic last session. Patient reports compliance with given home exercises.    Pertinent History Pt is a 56 year old female with primary complaint of R shoulder/pectoral pain. Symptoms began when patient was moving her Christmas tree and had fall (DOI: 04/03/21). Pt was holding Christmas tree box and landed onto R arm/shoulder. Pt had X-ray on shoulder and had no fracture or osseous abnormality. Pt reports difficulty performing pushing and lifting at work following this injury. Patient reports some tingling her hands over the last 2 weeks. Patient reports pain along  L paracervical region the first 2 nights after her fall; this "eased off." Patient reports pain with sneezing or coughing. She reports doing well with inhale/exhale. Pt did not have bruising or change in skin color. Patient is out of work now; has tentative return to work date on 04/29/21. Pt works at [employer] and performs physical labor e.g. lifting, pushing, shoveling.    Limitations Lifting;House hold activities   reaching cross-body   Diagnostic tests X-ray of R shoulder: Negative    Patient Stated Goals Able to get back into 5 mi/day walking regimen; decreased pain    Pain Onset 1 to 4 weeks ago                TREATMENT   Manual Therapy - for symptom modulation, soft tissue sensitivity and mobility as needed for shoulder complex ROM   STM R pectoralis major  R shoulder PROM within pt tolerance Glenohumeral joint mobilization, gr II-III inferior (grade II for pain control, grade III for joint mobility)     Therapeutic Exercise - for improved soft tissue flexibility and extensibility as needed for ROM, shoulder ROM as needed for reaching and self-care ADLs; isometrics with transition to graded loading to promote soft tissue healing and promote tissue remodeling   Upper body ergometer, 2 minutes forward, 2 minutes backward - for tissue warm-up to improve muscle performance, improved soft tissue mobility/extensibility - 2 minutes unbilled, interval subjective gathered during this time    Pulley; shoulder complex abduction; x 2 minutes, seated   D1 flexion with Nautilus; 10-lb; 2x8 with bilat UE   Wall ball stabilization; 2x20 clockwise/counterclockwise with therapist demonstration for avoidance of scapular protraction; 4-lb Medball   Shoulder tap, alternating; with upper limbs CKC on raised table with slight forward lean; 2x10 alternating L/R   Push/pull sled; with 25 lbs; 2x D/B, 35 feet (half of hallway)     Pt edu: HEP update and review     *not today* Shoulder reactive  isometrics, with Green Theraband; internal rotation; 2x10 Pec major stretch (90 deg abd), doorway; 3x30sec Column L in doorway; 2x10 Shoulder AAROM with wand; horizontal adduction/abduction in reclined position; 20x Shoulder isometrics, flexion; 2x10 each, 5 sec hold Shoulder AAROM with wand; abduction in reclined position; 20x     *NOT TODAY* Cold pack (unbilled) - for anti-inflammatory and analgesic effect as needed for reduced pain and improved ability to participate in active PT intervention, draped over R shoulder and along R chest well in reclined position, x 5 minutes          ASSESSMENT Patient has minimal sensitivity affecting pectoral region today and grossly WFL shoulder ROM. She does not have pain with sitting up from supine or reclined position at this time as needed for normal independent bed mobility. Pt is able to progress resisted cross-body adduction (utilized D1 flexion pattern) and pushing under moderate load without reproduction of symptoms, indicative of improving  ability to tolerate load along pectoral mm. Pt has remaining deficits in R deltoid and RTC strength, R horizontal adduction strength, moderate intermittent local nociceptive pain along R medial and lateral pectoral region. Patient will benefit from continued skilled therapeutic intervention to address the above deficits as needed for improved function and QoL.       PT Short Term Goals - 04/17/21 1124       PT SHORT TERM GOAL #1   Title Pt will be independent and 100% compliant with HEP and activity modification in order to improve strength and decrease pain in order to improve pain-free function at home and work.    Baseline 04/16/21: no HEP initiated today, discussed activity mod.    Time 3    Period Weeks    Status New    Target Date 05/08/21      PT SHORT TERM GOAL #2   Title Patient will have shoulder AROM symmetrical to opposite UE without reproduction of pain as needed for functional reaching,  overhead work, self-care, and recovery of function    Baseline 04/16/21: Shoulder AROM R/L Flexion 167/156, ABD 154/165, ER 82/86 (pain with shoulder ABD and ER).    Time 4    Period Weeks    Status New    Target Date 05/14/21               PT Long Term Goals - 04/16/21 1431       PT LONG TERM GOAL #1   Title Patient will demonstrate improved function as evidenced by a score of 71 on FOTO measure for full participation in activities at home and in the community.    Baseline 04/16/21: 55    Time 6    Period Weeks    Status New    Target Date 05/28/21      PT LONG TERM GOAL #2   Title Pt will decrease worst pain as reported on NPRS by at least 3 points in order to demonstrate clinically significant reduction in pain.    Baseline 04/16/21: Pain 8/10 at worst    Time 6    Period Weeks    Status New    Target Date 05/28/21      PT LONG TERM GOAL #3   Title Patient will demonstrate box lift from floor to waist and box transfer from surface to surface simulating work duties with load usually managed in working environment with sound body mechanics and no reproduction of back pain    Baseline 04/16/21: Patient is out of work at this time and unable to perform full physical work duties (lifting, pushing, pulling)    Time 6    Period Weeks    Target Date 05/28/21      PT LONG TERM GOAL #4   Title Patient will have MMT 4+/5 or greater for all R upper limb muscles assessed without reproduction of symptoms indicative of improved strength and improved tolerance of loading affected tissues as needed for performance of lifting/pushing/pulling duties at work    Baseline 04/16/21: MMT R Shoulder flexion 4*, ABD 4, ER 4, IR 4, horizontal adduction 4*    Time 6    Period Weeks    Status New    Target Date 05/28/21                    Patient will benefit from skilled therapeutic intervention in order to improve the following deficits and impairments:     Visit Diagnosis: Acute  pain of right shoulder  Acute chest wall pain  Muscle weakness (generalized)     Problem List Patient Active Problem List   Diagnosis Date Noted   Right hip pain 03/01/2021   Plantar fasciitis 01/05/2021   Hyperglycemia 07/23/2020   Sciatica 02/23/2020   Tinnitus 12/02/2019   Belching    Globus sensation    Hot flashes 07/01/2019   Loss of taste 02/05/2019   Headache 08/28/2018   Exposure to COVID-19 virus 08/24/2018   Chest pain 10/30/2017   Witnessed apneic spells 10/31/2016   Other neutropenia ([place]) 08/10/2016   Vertigo 07/28/2016   Health care maintenance 11/21/2014   History of colonic polyps 11/21/2014   Dysphagia 11/19/2014   Trochanteric bursitis of left hip 10/08/2014   URI (upper respiratory infection) 02/10/2014   Pelvic pain in female 12/25/2013   Personal history of ovarian cyst 12/25/2013   Change in bowel movement 11/11/2013   breast tenderness 09/29/2013   Dysphagia, unspecified(787.20) 06/12/2012   Barrett's esophagus 06/12/2012   Environmental allergies 02/13/2012   Enlarged thyroid 02/13/2012   GERD (gastroesophageal reflux disease) 02/08/2012   Leukopenia 02/08/2012   Fatigue 02/08/2012   Hypercholesterolemia 02/08/2012   Valentina Gu, PT, DPT #[id number]  Eilleen Kempf, PT 05/06/2021, 2:15 PM  [facility] [address] [place], [place], [address] Phone: [phone]   Fax:  [phone]  Name: Kelly Massey MRN: [id number] Date of Birth: Aug 17, 1965

## 2021-05-11 ENCOUNTER — Ambulatory Visit: Payer: 59 | Admitting: Physical Therapy

## 2021-05-11 NOTE — Patient Instructions (Incomplete)
°  TREATMENT   Manual Therapy - for symptom modulation, soft tissue sensitivity and mobility as needed for shoulder complex ROM   STM R pectoralis major  R shoulder PROM within pt tolerance Glenohumeral joint mobilization, gr II-III inferior (grade II for pain control, grade III for joint mobility)     Therapeutic Exercise - for improved soft tissue flexibility and extensibility as needed for ROM, shoulder ROM as needed for reaching and self-care ADLs; isometrics with transition to graded loading to promote soft tissue healing and promote tissue remodeling   Upper body ergometer, 2 minutes forward, 2 minutes backward - for tissue warm-up to improve muscle performance, improved soft tissue mobility/extensibility - 2 minutes unbilled, interval subjective gathered during this time    P[place]ley; shoulder complex abduction; x 2 minutes, seated   D1 flexion with Nautilus; 10-lb; 2x8 with bilat UE   Wall ball stabilization; 2x20 clockwise/counterclockwise with therapist demonstration for avoidance of scapular protraction; 4-lb Medball   Shoulder tap, alternating; with upper limbs CKC on raised table with slight forward lean; 2x10 alternating L/R   Push/pull sled; with 25 lbs; 2x D/B, 35 feet (half of hallway)     Pt edu: HEP update and review      *not today* Shoulder reactive isometrics, with Green Theraband; internal rotation; 2x10 Pec major stretch (90 deg abd), doorway; 3x30sec Column L in doorway; 2x10 Shoulder AAROM with wand; horizontal adduction/abduction in reclined position; 20x Shoulder isometrics, flexion; 2x10 each, 5 sec hold Shoulder AAROM with wand; abduction in reclined position; 20x     *NOT TODAY* Cold pack (unbilled) - for anti-inflammatory and analgesic effect as needed for reduced pain and improved ability to participate in active PT intervention, draped over R shoulder and along R chest well in reclined position, x 5 minutes          ASSESSMENT Patient has  minimal sensitivity affecting pectoral region today and grossly WFL shoulder ROM. She does not have pain with sitting up from supine or reclined position at this time as needed for normal independent bed mobility. Pt is able to progress resisted cross-body adduction (utilized D1 flexion pattern) and pushing under moderate load without reproduction of symptoms, indicative of improving ability to tolerate load along pectoral mm. Pt has remaining deficits in R deltoid and RTC strength, R horizontal adduction strength, moderate intermittent local nociceptive pain along R medial and lateral pectoral region. Patient will benefit from continued skilled therapeutic intervention to address the above deficits as needed for improved function and QoL.

## 2021-05-13 ENCOUNTER — Ambulatory Visit: Payer: 59 | Admitting: Physical Therapy

## 2021-05-13 ENCOUNTER — Other Ambulatory Visit: Payer: Self-pay

## 2021-05-13 DIAGNOSIS — M6281 Muscle weakness (generalized): Secondary | ICD-10-CM

## 2021-05-13 DIAGNOSIS — R0789 Other chest pain: Secondary | ICD-10-CM

## 2021-05-13 DIAGNOSIS — M25511 Pain in right shoulder: Secondary | ICD-10-CM

## 2021-05-13 NOTE — Therapy (Signed)
[facility] [address] [place], [place], [address] Phone: [phone]   Fax:  [phone]  Physical Therapy Treatment/ Physical Therapy Re-assessment and Discharge   Dates of reporting period  04/16/21   to   05/13/21   Patient Details  Name: Kelly Massey MRN: [id number] Date of Birth: Feb 21, 1966 Referring Provider (PT): Edmonia Lynch, MD   Encounter Date: 05/13/2021   PT End of Session - 05/14/21 1523     Visit Number 7    Number of Visits 13    Date for PT Re-Evaluation 05/28/21    Authorization Type U[employer] 2023, 60 combined PT/OT/Speech per calendar year    Progress Note Due on Visit 10    PT Start Time 1421    PT Stop Time 1505    PT Time Calculation (min) 44 min    Activity Tolerance Patient tolerated treatment well;Patient limited by pain    Behavior During Therapy [facility] for tasks assessed/performed             Past Medical History:  Diagnosis Date   Allergy    Anemia    Arthritis    knees - no meds   Colon polyps    HPP_ 2012   GERD (gastroesophageal reflux disease) 10/12/2010   EGD, positive H. pylori   HSV infection    History   Hyperlipidemia    ? no meds - diet controlled   Post-operative nausea and vomiting    Seasonal allergies     Past Surgical History:  Procedure Laterality Date   31 HOUR [place] STUDY N/A 07/25/2019   Procedure: 24 HOUR [place] STUDY;  Surgeon: Lavena Bullion, DO;  [facility]: [facility];  Service: Gastroenterology;  Laterality: N/A;   ABDOMINAL HYSTERECTOMY     BALLOON DILATION N/A 07/24/2012   Procedure: BALLOON DILATION;  Surgeon: Inda Castle, MD;  [facility];  Service: Endoscopy;  Laterality: N/A;   BRAVO [place] STUDY N/A 07/24/2012   Procedure: BRAVO [place] STUDY;  Surgeon: Inda Castle, MD;  [facility]: [facility];  Service: Endoscopy;  Laterality: N/A;   COLONOSCOPY     DILATION AND CURETTAGE OF UTERUS     SAB   ESOPHAGEAL MANOMETRY N/A 07/25/2019   Procedure:  ESOPHAGEAL MANOMETRY (EM);  Surgeon: Lavena Bullion, DO;  [facility];  Service: Gastroenterology;  Laterality: N/A;   ESOPHAGOGASTRODUODENOSCOPY N/A 07/24/2012   Procedure: ESOPHAGOGASTRODUODENOSCOPY (EGD);  Surgeon: Inda Castle, MD;  Location: [facility];  Service: Endoscopy;  Laterality: N/A;   OVARIAN CYST REMOVAL  2004   laparotomy -left   ROBOTIC ASSISTED LAPAROSCOPIC LYSIS OF ADHESION N/A 03/21/2014   Procedure: ROBOTIC ASSISTED LAPAROSCOPIC EXTENSIVE LYSIS OF ADHESION (1 Hour);  Surgeon: Marvene Staff, MD;  Location: [facility];  Service: Gynecology;  Laterality: N/A;   ROBOTIC ASSISTED SALPINGO OOPHERECTOMY Left 03/21/2014   Procedure:  ROBOTIC ASSISTED LEFT OOPHORECTOMY;  Surgeon: Marvene Staff, MD;  Location: [facility];  Service: Gynecology;  Laterality: Left;   rotator cuff surgery  2017   SHOULDER ARTHROSCOPY Left    TENNIS ELBOW RELEASE/NIRSCHEL PROCEDURE Right 05/01/2015   Procedure: RIGHT ELBOW DEBRIDEMENT AND TENDON REPAIR;  Surgeon: [name], MD;  Location: [facility];  Service: Orthopedics;  Laterality: Right;   UPPER GASTROINTESTINAL ENDOSCOPY     WISDOM TOOTH EXTRACTION      There were no vitals filed for this visit.   Subjective Assessment - 05/13/21 1430     Subjective  Pt reports no pain recently with raking leaves in yard and pulling tarp to move leaves into natural area in her yard. Patient reports 95% SANE score at this time. She reports she is currently unsure about completing heavy lifting at work. Patient reports doing well with changing positions in bed. Patient's tentative return to work date is tomorrow. Pt had good update from referring doctor's office this AM.    Pertinent History Pt is a 56 year old female with primary complaint of R shoulder/pectoral pain. Symptoms began when patient was moving her Christmas tree and had fall (DOI: 04/03/21). Pt was holding Christmas tree box and landed onto R arm/shoulder. Pt  had X-ray on shoulder and had no fracture or osseous abnormality. Pt reports difficulty performing pushing and lifting at work following this injury. Patient reports some tingling her hands over the last 2 weeks. Patient reports pain along L paracervical region the first 2 nights after her fall; this "eased off." Patient reports pain with sneezing or coughing. She reports doing well with inhale/exhale. Pt did not have bruising or change in skin color. Patient is out of work now; has tentative return to work date on 04/29/21. Pt works at [employer] and performs physical labor e.g. lifting, pushing, shoveling.    Limitations Lifting;House hold activities   reaching cross-body   Diagnostic tests X-ray of R shoulder: Negative    Patient Stated Goals Able to get back into 5 mi/day walking regimen; decreased pain    Pain Onset 1 to 4 weeks ago             OBJECTIVE    Palpation No tenderness to palpation today    Strength R/L 5-/5 Shoulder flexion (anterior deltoid/pec major/coracobrachialis, axillary n. (C5-6) and musculocutaneous n. (C5-7)) 5-/5 Shoulder abduction (deltoid/supraspinatus, axillary/suprascapular n, C5) 5-/4+ Shoulder external rotation (infraspinatus/teres minor) 5/4+ Shoulder internal rotation (subcapularis/lats/pec major) 4+/5 Shoulder horizontal adduction  5/5 Elbow flexion (biceps brachii, brachialis, brachioradialis, musculoskeletal n, C5-6) 5/5 Elbow extension (triceps, radial n, C7)     AROM R/L 175/167 Shoulder flexion 161/171 Shoulder abduction 82/83 Shoulder external rotation 70/70 Shoulder internal rotation 52/64 Shoulder extension *Indicates pain, overpressure performed unless otherwise indicated   PROM R/L 175/180 Shoulder flexion 175*/180 Shoulder abduction 90/90 Shoulder external rotation 70/70 Shoulder internal rotation *Indicates pain, overpressure performed unless otherwise indicated     TREATMENT   Therapeutic Activities  Re-assessment  performed (see above and updated Goal section below)    Manual Therapy - for symptom modulation, soft tissue sensitivity and mobility as needed for shoulder complex ROM   STM R pectoralis major  R shoulder PROM within pt tolerance Glenohumeral joint mobilization, gr II-III inferior (grade II for pain control, grade III for joint mobility)     Therapeutic Exercise - for improved soft tissue flexibility and extensibility as needed for ROM, shoulder ROM as needed for reaching and self-care ADLs; isometrics with transition to graded loading to promote soft tissue healing and promote tissue remodeling   Upper body ergometer, 2 minutes forward, 2 minutes backward - for tissue warm-up to improve muscle performance, improved soft tissue mobility/extensibility - 1 minute unbilled, interval subjective gathered during this time    Box Lift; 25-lbs; chair to adjacent table at waist height; x 5 over and back Box Lift; 25-lbs; floor to waist; x5   Push/pull sled; with 75 lbs; 2x D/B, 35 feet (half of hallway)        *not today* Shoulder tap, alternating; with upper limbs CKC on raised table with  slight forward lean; 2x10 alternating L/R D1 flexion with Nautilus; 10-lb; 2x8 with bilat UE Wall ball stabilization; 2x20 clockwise/counterclockwise with therapist demonstration for avoidance of scapular protraction; 4-lb Medball Pulley; shoulder complex abduction; x 2 minutes, seated Shoulder reactive isometrics, with Green Theraband; internal rotation; 2x10 Pec major stretch (90 deg abd), doorway; 3x30sec Column L in doorway; 2x10 Shoulder AAROM with wand; horizontal adduction/abduction in reclined position; 20x Shoulder isometrics, flexion; 2x10 each, 5 sec hold Shoulder AAROM with wand; abduction in reclined position; 20x     *NOT TODAY* Cold pack (unbilled) - for anti-inflammatory and analgesic effect as needed for reduced pain and improved ability to participate in active PT intervention, draped  over R shoulder and along R chest well in reclined position, x 5 minutes          ASSESSMENT Patient has met or mostly met all established PT goals (only mild cueing for technique with box lifting required today, pt able to complete lifting tasks pain-free). She has no significant anterior shoulder or pectoral-region pain presently. She is tolerating pushing, pulling, and lifting well at this time similar to intensity of her work duties without reproduction of symptoms. She has surpassed predicted FOTO score. Pt demonstrates good shoulder AROM bilaterally. She has met long-term strength goal and is making good progress at this time. Given functional abilities demonstrated in clinic, pt will likely return to work duties without major issue - discussed with patient getting assistance as needed for heavier physical tasks and following up with MD if she had issues during her first week back to work.       PT Short Term Goals - 05/14/21 1530       PT SHORT TERM GOAL #1   Title Pt will be independent and 100% compliant with HEP and activity modification in order to improve strength and decrease pain in order to improve pain-free function at home and work.    Baseline 04/16/21: no HEP initiated today, discussed activity mod.  05/14/21: Pt demonstrates sound understanding of exercise and is compliant with HEP.    Time 3    Period Weeks    Status Achieved    Target Date 05/08/21      PT SHORT TERM GOAL #2   Title Patient will have shoulder AROM symmetrical to opposite UE without reproduction of pain as needed for functional reaching, overhead work, self-care, and recovery of function    Baseline 04/16/21: Shoulder AROM R/L Flexion 167/156, ABD 154/165, ER 82/86 (pain with shoulder ABD and ER).    05/14/21: Shoulder AROM R/L Flexion 175/167, ABD 161/171, ER 82/83 (no pain).   AROM at least within 10 deg of contralateral UE   Time 4    Period Weeks    Status Achieved    Target Date 05/14/21                PT Long Term Goals - 05/14/21 1532       PT LONG TERM GOAL #1   Title Patient will demonstrate improved function as evidenced by a score of 71 on FOTO measure for full participation in activities at home and in the community.    Baseline 04/16/21: 55.   05/14/21: 101.    Time 6    Period Weeks    Status Achieved    Target Date 05/28/21      PT LONG TERM GOAL #2   Title Pt will decrease worst pain as reported on NPRS by at least 3 points in order  to demonstrate clinically significant reduction in pain.    Baseline 04/16/21: Pain 8/10 at worst.   05/14/21: No pain over the past week.    Time 6    Period Weeks    Status Achieved    Target Date 05/28/21      PT LONG TERM GOAL #3   Title Patient will demonstrate box lift from floor to waist and box transfer from surface to surface simulating work duties with load usually managed in working environment with sound body mechanics and no reproduction of back pain    Baseline 04/16/21: Patient is out of work at this time and unable to perform full physical work duties (lifting, pushing, pulling).  05/14/21: Performed today with no reproduction of pain with fair body mechanics, moderate cueing for upright trunk versus thoracic flexion.    Time 6    Period Weeks    Status Partially Met    Target Date 05/28/21      PT LONG TERM GOAL #4   Title Patient will have MMT 4+/5 or greater for all R upper limb muscles assessed without reproduction of symptoms indicative of improved strength and improved tolerance of loading affected tissues as needed for performance of lifting/pushing/pulling duties at work    Baseline 04/16/21: MMT R Shoulder flexion 4*, ABD 4, ER 4, IR 4, horizontal adduction 4*.   05/14/21: MMT R Shoulder Flexion 5-, ABD 5-, ER 5-, IR 5, horizontal adduction 4+.    Time 6    Period Weeks    Status Achieved    Target Date 05/28/21                   Plan - 05/14/21 1547     Clinical Impression Statement Patient has met or  mostly met all established PT goals (only mild cueing for technique with box lifting required today, pt able to complete lifting tasks pain-free). She has no significant anterior shoulder or pectoral-region pain presently. She is tolerating pushing, pulling, and lifting well at this time similar to intensity of her work duties without reproduction of symptoms. She has surpassed predicted FOTO score. Pt demonstrates good shoulder AROM bilaterally. She has met long-term strength goal and is making good progress at this time. Given functional abilities demonstrated in clinic, pt will likely return to work duties without major issue - discussed with patient getting assistance as needed for heavier physical tasks and following up with MD if she had issues during her first week back to work.    Personal Factors and Comorbidities Past/Current Experience;Comorbidity 3+;Profession   pt's job requires heavy physical labor   Comorbidities GERD, Barret's esophagus, high cholesterol, leukopenia    Examination-Activity Limitations Lift;Reach Overhead;Bed Mobility;Carry    Examination-Participation Restrictions Occupation;Community Activity    Stability/Clinical Decision Making Stable/Uncomplicated    Rehab Potential Good    Clinical Impairments Affecting Rehab Potential Positive: Age. Motivation. Active lifestyle. Negative: heavy physical work required for job (pushing, pulling, lifting, shoveling)    PT Frequency 2x / week    PT Duration 6 weeks    PT Treatment/Interventions Electrical Stimulation;Therapeutic activities;Therapeutic exercise;Neuromuscular re-education;Manual techniques;Patient/family education;Dry needling;Moist Heat;Cryotherapy    PT Next Visit Plan Continuing with HEP and return to normal daily activities and work, f/u with MD and PT as needed pending any regression in conditon with return to her job    PT [place] [id number]    Consulted and Agree with Plan of Care Patient  Patient will benefit from skilled therapeutic intervention in order to improve the following deficits and impairments:  Decreased range of motion, Decreased strength, Pain, Impaired flexibility, Impaired UE functional use  Visit Diagnosis: Acute pain of right shoulder  Acute chest wall pain  Muscle weakness (generalized)     Problem List Patient Active Problem List   Diagnosis Date Noted   Right hip pain 03/01/2021   Plantar fasciitis 01/05/2021   Hyperglycemia 07/23/2020   Sciatica 02/23/2020   Tinnitus 12/02/2019   Belching    Globus sensation    Hot flashes 07/01/2019   Loss of taste 02/05/2019   Headache 08/28/2018   Exposure to COVID-19 virus 08/24/2018   Chest pain 10/30/2017   Witnessed apneic spells 10/31/2016   Other neutropenia ([place]) 08/10/2016   Vertigo 07/28/2016   Health care maintenance 11/21/2014   History of colonic polyps 11/21/2014   Dysphagia 11/19/2014   Trochanteric bursitis of left hip 10/08/2014   URI (upper respiratory infection) 02/10/2014   Pelvic pain in female 12/25/2013   Personal history of ovarian cyst 12/25/2013   Change in bowel movement 11/11/2013   breast tenderness 09/29/2013   Dysphagia, unspecified(787.20) 06/12/2012   Barrett's esophagus 06/12/2012   Environmental allergies 02/13/2012   Enlarged thyroid 02/13/2012   GERD (gastroesophageal reflux disease) 02/08/2012   Leukopenia 02/08/2012   Fatigue 02/08/2012   Hypercholesterolemia 02/08/2012   Valentina Gu, PT, DPT #[id number]  Eilleen Kempf, PT 05/14/2021, 3:48 PM  [facility] [facility] [address] [place], [place], [address] Phone: [phone]   Fax:  [phone]  Name: RAJANEE SCHUELKE MRN: [id number] Date of Birth: 1965-07-04

## 2021-05-14 ENCOUNTER — Encounter: Payer: Self-pay | Admitting: Physical Therapy

## 2021-06-26 ENCOUNTER — Ambulatory Visit: Payer: 59 | Admitting: Internal Medicine

## 2021-06-30 ENCOUNTER — Ambulatory Visit: Payer: 59 | Admitting: Internal Medicine

## 2021-06-30 ENCOUNTER — Encounter: Payer: Self-pay | Admitting: Internal Medicine

## 2021-06-30 ENCOUNTER — Other Ambulatory Visit: Payer: Self-pay

## 2021-06-30 VITALS — BP 126/80 | HR 85 | Temp 98.0°F | Ht 66.0 in | Wt 195.8 lb

## 2021-06-30 DIAGNOSIS — D72819 Decreased white blood cell count, unspecified: Secondary | ICD-10-CM | POA: Diagnosis not present

## 2021-06-30 DIAGNOSIS — D708 Other neutropenia: Secondary | ICD-10-CM

## 2021-06-30 DIAGNOSIS — K2100 Gastro-esophageal reflux disease with esophagitis, without bleeding: Secondary | ICD-10-CM

## 2021-06-30 DIAGNOSIS — E049 Nontoxic goiter, unspecified: Secondary | ICD-10-CM

## 2021-06-30 DIAGNOSIS — Z9109 Other allergy status, other than to drugs and biological substances: Secondary | ICD-10-CM

## 2021-06-30 DIAGNOSIS — K2270 Barrett's esophagus without dysplasia: Secondary | ICD-10-CM

## 2021-06-30 DIAGNOSIS — E7800 Pure hypercholesterolemia, unspecified: Secondary | ICD-10-CM | POA: Diagnosis not present

## 2021-06-30 DIAGNOSIS — R739 Hyperglycemia, unspecified: Secondary | ICD-10-CM

## 2021-06-30 DIAGNOSIS — M25552 Pain in left hip: Secondary | ICD-10-CM

## 2021-06-30 DIAGNOSIS — M25551 Pain in right hip: Secondary | ICD-10-CM

## 2021-06-30 DIAGNOSIS — Z86010 Personal history of colonic polyps: Secondary | ICD-10-CM

## 2021-06-30 DIAGNOSIS — Z0000 Encounter for general adult medical examination without abnormal findings: Secondary | ICD-10-CM

## 2021-06-30 NOTE — Progress Notes (Signed)
Patient ID: Kelly Massey, female   DOB: 23-Dec-1965, 56 y.o.   MRN: [id number] ? ? ?Subjective:  ? ? Patient ID: Kelly Massey, female    DOB: [birth year], 56 y.o.   MRN: [id number] ? ?This visit occurred during the SARS-CoV-2 public health emergency.  Safety protocols were in place, including screening questions prior to the visit, additional usage of staff PPE, and extensive cleaning of exam room while observing appropriate contact time as indicated for disinfecting solutions.  ? ?Patient here for a scheduled follow up.  ? ?Chief Complaint  ?Patient presents with  ? Follow-up  ?  74mof/u - pt c/o L Lower back and hip pain. Saw a back specialist, nothing done. Was referred to [facility] by specialist. Has not been seen yet. Finished P.T  ? .  ? ?HPI ?Recently returned from [place]  Doing relatively well.  Tries to stay active.  Does report persistent lower back and hip pain.  Went to PT.  Plans to see ortho.  Scheduled to see [facility].  No chest pain or sob reported.  Acid reflux - better.  No abdominal pain.  Bowels moving.   ? ? ?Past Medical History:  ?Diagnosis Date  ? Allergy   ? Anemia   ? Arthritis   ? knees - no meds  ? Colon polyps   ? HPP_ 2012  ? GERD (gastroesophageal reflux disease) 10/12/2010  ? EGD, positive H. pylori  ? HSV infection   ? History  ? Hyperlipidemia   ? ? no meds - diet controlled  ? Post-operative nausea and vomiting   ? Seasonal allergies   ? ?Past Surgical History:  ?Procedure Laterality Date  ? [place] 07[place]  ? Procedure: [place];  Surgeon: CLavena Bullion DO;  [facility];  Service: Gastroenterology;  Laterality: N/A;  ? ABDOMINAL HYSTERECTOMY    ? BALLOON DILATION N/A 07/24/2012  ? Procedure: BALLOON DILATION;  Surgeon: RInda Castle MD;  [facility];  Service: Endoscopy;  Laterality: N/A;  ? BRAVO [place] N/A 07/24/2012  ? Procedure: B[place]VO [place];  Surgeon: RInda Castle MD;  [facility];  Service:  Endoscopy;  Laterality: N/A;  ? COLONOSCOPY    ? DILATION AND CURETTAGE OF UTERUS    ? SAB  ? ESOPHAGEAL MANOMETRY N/A 07/25/2019  ? Procedure: ESOPHAGEAL MANOMETRY (EM);  Surgeon: CLavena Bullion DO;  [facility];  Service: Gastroenterology;  Laterality: N/A;  ? ESOPHAGOGASTRODUODENOSCOPY N/A 07/24/2012  ? Procedure: ESOPHAGOGASTRODUODENOSCOPY (EGD);  Surgeon: RInda Castle MD;  [facility];  Service: Endoscopy;  Laterality: N/A;  ? OVARIAN CYST REMOVAL  2004  ? laparotomy -left  ? ROBOTIC ASSISTED LAPAROSCOPIC LYSIS OF ADHESION N/A 03/21/2014  ? Procedure: ROBOTIC ASSISTED LAPAROSCOPIC EXTENSIVE LYSIS OF ADHESION (1 Hour);  Surgeon: SMarvene Staff MD;  [facility];  Service: Gynecology;  Laterality: N/A;  ? ROBOTIC ASSISTED SALPINGO OOPHERECTOMY Left 03/21/2014  ? Procedure:  ROBOTIC ASSISTED LEFT OOPHORECTOMY;  Surgeon: SMarvene Staff MD;  [facility];  Service: Gynecology;  Laterality: Left;  ? rotator cuff surgery  2017  ? SHOULDER ARTHROSCOPY Left   ? TENNIS ELBOW RELEASE/NIRSCHEL PROCEDURE Right 05/01/2015  ? Procedure: RIGHT ELBOW DEBRIDEMENT AND TENDON REPAIR;  Surgeon: DNinetta Lights MD;  [facility]  Service: Orthopedics;  Laterality: Right;  ? UPPER GASTROINTESTINAL ENDOSCOPY    ? WISDOM TOOTH EXTRACTION    ? ?Family  History  ?Problem Relation Age of Onset  ? Arthritis Mother   ? Stroke Mother   ? Hypertension Mother   ? Heart failure Mother   ? Dementia Mother   ? Lung cancer Father   ? Diabetes Sister   ? Diabetes Brother   ?     x 2  ? Breast cancer Maternal Aunt   ? Throat cancer Maternal Aunt   ?     Smoker  ? Esophageal cancer Maternal Aunt   ? Prostate cancer Maternal Uncle   ?     x 2  ? Diabetes Maternal Uncle   ? Diabetes Paternal Grandmother   ? Diabetes Paternal Grandfather   ? Hypertension Other   ? Hyperlipidemia Other   ? Colon cancer Neg Hx   ? Stomach cancer Neg Hx   ? Rectal cancer Neg Hx   ? Colon polyps  Neg Hx   ? ?Social History  ? ?Socioeconomic History  ? Marital status: Married  ?  Spouse name: Not on file  ? Number of children: 0  ? Years of education: Not on file  ? Highest education level: Not on file  ?Occupational History  ? Occupation: RESEARCH  ?  Employer: [employer]  ?Tobacco Use  ? Smoking status: Never  ? Smokeless tobacco: Never  ?Vaping Use  ? Vaping Use: Never used  ?Substance and Sexual Activity  ? Alcohol use: Yes  ?  Alcohol/week: 0.0 standard drinks  ?  Comment: occassional  ? Drug use: No  ? Sexual activity: Yes  ?  Birth control/protection: None, Surgical  ?  Comment: hysterectomy  ?Other Topics Concern  ? Not on file  ?Social History Narrative  ? [place]   ? ?Social Determinants of Health  ? ?Financial Resource Strain: Not on file  ?Food Insecurity: Not on file  ?Transportation Needs: Not on file  ?Physical Activity: Not on file  ?Stress: Not on file  ?Social Connections: Not on file  ? ? ? ?Review of Systems  ?Constitutional:  Negative for appetite change and unexpected weight change.  ?HENT:  Negative for congestion and sinus pressure.   ?Respiratory:  Negative for cough, chest tightness and shortness of breath.   ?Cardiovascular:  Negative for chest pain, palpitations and leg swelling.  ?Gastrointestinal:  Negative for abdominal pain, diarrhea, nausea and vomiting.  ?Genitourinary:  Negative for difficulty urinating and dysuria.  ?Musculoskeletal:  Positive for back pain. Negative for joint swelling and myalgias.  ?Skin:  Negative for color change and rash.  ?Neurological:  Negative for dizziness, light-headedness and headaches.  ?Psychiatric/Behavioral:  Negative for agitation and dysphoric mood.   ? ?   ?Objective:  ?  ? ?BP 126/80 (BP Location: Left Arm, Patient Position: Sitting, Cuff Size: Small)   Pulse 85   Temp 98 ?F (36.7 ?C) (Oral)   Ht '5\' 6"'$  (1.676 m)   Wt 195 lb 12.8 oz (88.8 kg)   LMP 10/06/2011   SpO2 94%   BMI 31.60 kg/m?  ?Wt Readings from Last 3  Encounters:  ?06/30/21 195 lb 12.8 oz (88.8 kg)  ?02/23/21 191 lb 9.6 oz (86.9 kg)  ?01/05/21 188 lb (85.3 kg)  ? ? ?Physical Exam ?Vitals reviewed.  ?Constitutional:   ?   General: She is not in acute distress. ?   Appearance: Normal appearance.  ?HENT:  ?   Head: Normocephalic and atraumatic.  ?   Right Ear: External ear normal.  ?   Left Ear: External ear  normal.  ?Eyes:  ?   General: No scleral icterus.    ?   Right eye: No discharge.     ?   Left eye: No discharge.  ?   Conjunctiva/sclera: Conjunctivae normal.  ?Neck:  ?   Thyroid: No thyromegaly.  ?Cardiovascular:  ?   Rate and Rhythm: Normal rate and regular rhythm.  ?Pulmonary:  ?   Effort: No respiratory distress.  ?   Breath sounds: Normal breath sounds. No wheezing.  ?Abdominal:  ?   General: Bowel sounds are normal.  ?   Palpations: Abdomen is soft.  ?   Tenderness: There is no abdominal tenderness.  ?Musculoskeletal:     ?   General: No swelling or tenderness.  ?   Cervical back: Neck supple. No tenderness.  ?Lymphadenopathy:  ?   Cervical: No cervical adenopathy.  ?Skin: ?   Findings: No erythema or rash.  ?Neurological:  ?   Mental Status: She is alert.  ?Psychiatric:     ?   Mood and Affect: Mood normal.     ?   Behavior: Behavior normal.  ? ? ? ?Outpatient Encounter Medications as of 06/30/2021  ?Medication Sig  ? acyclovir (ZOVIRAX) 400 MG tablet TAKE 1 TABLET BY MOUTH 2 TIMES DAILY AS NEEDED.  ? Calcium Carbonate-Vitamin D 600-400 MG-UNIT tablet Take 1 tablet by mouth daily.  ? diphenhydrAMINE (BENADRYL) 12.5 MG/5ML elixir Take by mouth as needed.  ? famotidine (PEPCID) 20 MG tablet TAKE 1 TABLET BY MOUTH TWICE A DAY  ? ferrous sulfate 325 (65 FE) MG tablet Take 325 mg by mouth daily.   ? Fluocinolone Acetonide Body 0.01 % OIL Apply to scalp before shampooing. Use for 6-10 hours with the cap each week to every other week.  ? fluticasone (FLONASE) 50 MCG/ACT nasal spray Place 2 sprays into both nostrils daily. To use in place of your Nasonex  ?  mometasone (NASONEX) 50 MCG/ACT nasal spray PLACE 2 SPRAYS INTO THE NOSE DAILY.  ? vitamin E 400 UNIT capsule Take 400 Units by mouth daily. Reported on 10/09/2015  ? ?Facility-Administered Encounter Medications as of 06/30/2021  ?

## 2021-07-05 ENCOUNTER — Encounter: Payer: Self-pay | Admitting: Internal Medicine

## 2021-07-05 DIAGNOSIS — M25559 Pain in unspecified hip: Secondary | ICD-10-CM | POA: Insufficient documentation

## 2021-07-05 NOTE — Assessment & Plan Note (Signed)
On pepcid and doing well.  Follow.   ?

## 2021-07-05 NOTE — Assessment & Plan Note (Signed)
Followed by GI

## 2021-07-05 NOTE — Assessment & Plan Note (Signed)
Has steroid nasal spray.  Follow.  No increased symptoms currently.  ?

## 2021-07-05 NOTE — Assessment & Plan Note (Signed)
Watching her diet.  Low carb diet and exercise.  Follow met b and a1c.  ?

## 2021-07-05 NOTE — Assessment & Plan Note (Signed)
Has been worked up by hematology.  Felt related to PPI.  Follow cbc.  Recheck with net labs.  

## 2021-07-05 NOTE — Assessment & Plan Note (Signed)
Has been to PT.  Continue exercises.  Plans f/u with [facility].  ?

## 2021-07-05 NOTE — Assessment & Plan Note (Signed)
The 10-year ASCVD risk score (Arnett DK, et al., 2019) is: 2.9% ?  Values used to calculate the score: ?    Age: 56 years ?    Sex: Female ?    Is Non-Hispanic African American: Yes ?    Diabetic: No ?    Tobacco smoker: No ?    Systolic Blood Pressure: 027 mmHg ?    Is BP treated: No ?    HDL Cholesterol: 79.8 mg/dL ?    Total Cholesterol: 245 mg/dL  ?Low cholesterol diet and exercise.  Follow lipid panel.   ?

## 2021-07-05 NOTE — Assessment & Plan Note (Signed)
Previous ultrasound - no nodules.  Follow tsh.  

## 2021-07-05 NOTE — Assessment & Plan Note (Signed)
Colonoscopy 01/25/21 - Tortuous colon. One 3 mm polyp in the ascending colon, removed with a cold snare. Two 3 mm polyps at the recto-sigmoid colon, removed with a cold snare. Diverticulosis - sigmoid.  

## 2021-07-09 ENCOUNTER — Other Ambulatory Visit (INDEPENDENT_AMBULATORY_CARE_PROVIDER_SITE_OTHER): Payer: 59

## 2021-07-09 ENCOUNTER — Encounter: Payer: Self-pay | Admitting: Internal Medicine

## 2021-07-09 DIAGNOSIS — E049 Nontoxic goiter, unspecified: Secondary | ICD-10-CM

## 2021-07-09 DIAGNOSIS — E7800 Pure hypercholesterolemia, unspecified: Secondary | ICD-10-CM

## 2021-07-09 DIAGNOSIS — R739 Hyperglycemia, unspecified: Secondary | ICD-10-CM | POA: Diagnosis not present

## 2021-07-09 DIAGNOSIS — D72819 Decreased white blood cell count, unspecified: Secondary | ICD-10-CM

## 2021-07-09 DIAGNOSIS — Z0000 Encounter for general adult medical examination without abnormal findings: Secondary | ICD-10-CM | POA: Diagnosis not present

## 2021-07-09 LAB — BASIC METABOLIC PANEL
BUN: 9 mg/dL (ref 6–23)
CO2: 29 mEq/L (ref 19–32)
Calcium: 9.7 mg/dL (ref 8.4–10.5)
Chloride: 103 mEq/L (ref 96–112)
Creatinine, Ser: 0.85 mg/dL (ref 0.40–1.20)
GFR: 76.79 mL/min (ref >60.00)
Glucose, Bld: 89 mg/dL (ref 70–99)
Potassium: 4.7 mEq/L (ref 3.5–5.1)
Sodium: 140 mEq/L (ref 135–145)

## 2021-07-09 LAB — CBC WITH DIFFERENTIAL/PLATELET
Basophils Absolute: 0 10*3/uL (ref 0.0–0.1)
Basophils Relative: 0.9 % (ref 0.0–3.0)
Eosinophils Absolute: 0 10*3/uL (ref 0.0–0.7)
Eosinophils Relative: 0.7 % (ref 0.0–5.0)
HCT: 41.8 % (ref 36.0–46.0)
Hemoglobin: 13.8 g/dL (ref 12.0–15.0)
Lymphocytes Relative: 45 % (ref 12.0–46.0)
Lymphs Abs: 1.5 10*3/uL (ref 0.7–4.0)
MCHC: 33 g/dL (ref 30.0–36.0)
MCV: 91.3 fl (ref 78.0–100.0)
Monocytes Absolute: 0.3 10*3/uL (ref 0.1–1.0)
Monocytes Relative: 8.9 % (ref 3.0–12.0)
Neutro Abs: 1.5 10*3/uL (ref 1.4–7.7)
Neutrophils Relative %: 44.5 % (ref 43.0–77.0)
Platelets: 195 10*3/uL (ref 150.0–400.0)
RBC: 4.58 Mil/uL (ref 3.87–5.11)
RDW: 15.2 % (ref 11.5–15.5)
WBC: 3.3 10*3/uL — ABNORMAL LOW (ref 4.0–10.5)

## 2021-07-09 LAB — LIPID PANEL
Cholesterol: 230 mg/dL — ABNORMAL HIGH (ref 0–200)
HDL: 86 mg/dL (ref >39.00)
LDL Cholesterol: 129 mg/dL — ABNORMAL HIGH (ref 0–99)
NonHDL: 144.06
Total CHOL/HDL Ratio: 3
Triglycerides: 75 mg/dL (ref 0.0–149.0)
VLDL: 15 mg/dL (ref 0.0–40.0)

## 2021-07-09 LAB — HEPATIC FUNCTION PANEL
ALT: 27 U/L (ref 0–35)
AST: 28 U/L (ref 0–37)
Albumin: 4.6 g/dL (ref 3.5–5.2)
Alkaline Phosphatase: 74 U/L (ref 39–117)
Bilirubin, Direct: 0.1 mg/dL (ref 0.0–0.3)
Total Bilirubin: 0.6 mg/dL (ref 0.2–1.2)
Total Protein: 7.5 g/dL (ref 6.0–8.3)

## 2021-07-09 LAB — TSH: TSH: 0.81 u[IU]/mL (ref 0.35–5.50)

## 2021-07-09 LAB — HEMOGLOBIN A1C: Hgb A1c MFr Bld: 6.3 % (ref 4.6–6.5)

## 2021-07-30 ENCOUNTER — Telehealth: Payer: Self-pay | Admitting: Internal Medicine

## 2021-07-30 NOTE — Telephone Encounter (Signed)
Pt called in stating she received a paper in the mail from [facility] screen[facility] and wants to know if the provider does these screenings. Pt stated the insurance does not cover it. ?Carotid Artery Disease, Peripheral Arterial Disease, Abdominal Aortic Aneurysm, Atrial Fibrillation, Osteoporosis Risk ?

## 2021-07-31 NOTE — Telephone Encounter (Signed)
I can talk with her more at her appt, but [facility] screen[facility] are multiple screening tests that are down for one lump fee.   ?

## 2021-08-09 ENCOUNTER — Other Ambulatory Visit: Payer: Self-pay | Admitting: Gastroenterology

## 2021-08-19 ENCOUNTER — Telehealth: Payer: Self-pay | Admitting: Internal Medicine

## 2021-08-19 ENCOUNTER — Other Ambulatory Visit: Payer: Self-pay

## 2021-08-19 MED ORDER — FAMOTIDINE 20 MG PO TABS: 20.0000 mg | ORAL_TABLET | Freq: Two times a day (BID) | ORAL | 1 refills | Status: DC

## 2021-08-19 NOTE — Telephone Encounter (Signed)
sent 

## 2021-08-19 NOTE — Telephone Encounter (Signed)
Pt requesting a refill [facility]idine [facility] [place] ?

## 2021-09-17 ENCOUNTER — Other Ambulatory Visit: Payer: Self-pay | Admitting: Internal Medicine

## 2021-09-17 DIAGNOSIS — Z1231 Encounter for screening mammogram for malignant neoplasm of breast: Secondary | ICD-10-CM

## 2021-10-07 ENCOUNTER — Ambulatory Visit: Payer: 59 | Admitting: Internal Medicine

## 2021-10-07 ENCOUNTER — Encounter: Payer: Self-pay | Admitting: Internal Medicine

## 2021-10-07 VITALS — BP 124/84 | HR 65 | Temp 98.3°F | Resp 15 | Ht 65.0 in | Wt 192.2 lb

## 2021-10-07 DIAGNOSIS — R739 Hyperglycemia, unspecified: Secondary | ICD-10-CM

## 2021-10-07 DIAGNOSIS — M25552 Pain in left hip: Secondary | ICD-10-CM

## 2021-10-07 DIAGNOSIS — D72819 Decreased white blood cell count, unspecified: Secondary | ICD-10-CM

## 2021-10-07 DIAGNOSIS — K2270 Barrett's esophagus without dysplasia: Secondary | ICD-10-CM

## 2021-10-07 DIAGNOSIS — E049 Nontoxic goiter, unspecified: Secondary | ICD-10-CM

## 2021-10-07 DIAGNOSIS — Z86010 Personal history of colonic polyps: Secondary | ICD-10-CM

## 2021-10-07 DIAGNOSIS — R1310 Dysphagia, unspecified: Secondary | ICD-10-CM

## 2021-10-07 DIAGNOSIS — E7800 Pure hypercholesterolemia, unspecified: Secondary | ICD-10-CM

## 2021-10-07 DIAGNOSIS — M25551 Pain in right hip: Secondary | ICD-10-CM

## 2021-10-07 DIAGNOSIS — K2100 Gastro-esophageal reflux disease with esophagitis, without bleeding: Secondary | ICD-10-CM

## 2021-10-07 NOTE — Progress Notes (Signed)
Patient ID: Kelly Massey, female   DOB: Sep 16, 1965, 56 y.o.   MRN: [id number]   Subjective:    Patient ID: Kelly Massey, female    DOB: 10/21/1965, 56 y.o.   MRN: [id number]   Patient here for a scheduled follow up.   Chief Complaint  Patient presents with   Hyperlipidemia   Gastroesophageal Reflux   .   HPI Having worsening problems with acid reflux.  Takes H2 blocker before breakfast and before evening meal.  Also taking mylanta.  Still with burning in her throat.  Occurring intermittently.  Flares at lease a couple times per week.  Feels full fast.  Increased burping.  Feels at times like pills just sit and do not go down.  No chest pain.  Breathing stable.  No increased congestion. Back/buttock and groin discomfort - ortho - PT.     Past Medical History:  Diagnosis Date   Allergy    Anemia    Arthritis    knees - no meds   Colon polyps    HPP_ 2012   GERD (gastroesophageal reflux disease) 10/12/2010   EGD, positive H. pylori   HSV infection    History   Hyperlipidemia    ? no meds - diet controlled   Post-operative nausea and vomiting    Seasonal allergies    Past Surgical History:  Procedure Laterality Date   23 HOUR [place] STUDY N/A 07/25/2019   Procedure: 24 HOUR [place] STUDY;  Surgeon: Lavena Bullion, DO;  Location: [facility];  Service: Gastroenterology;  Laterality: N/A;   ABDOMINAL HYSTERECTOMY     BALLOON DILATION N/A 07/24/2012   Procedure: BALLOON DILATION;  Surgeon: Inda Castle, MD;  Location: [facility];  Service: Endoscopy;  Laterality: N/A;   BRAVO [place] STUDY N/A 07/24/2012   Procedure: BRAVO [place] STUDY;  Surgeon: Inda Castle, MD;  Location: [facility];  Service: Endoscopy;  Laterality: N/A;   COLONOSCOPY     DILATION AND CURETTAGE OF UTERUS     SAB   ESOPHAGEAL MANOMETRY N/A 07/25/2019   Procedure: ESOPHAGEAL MANOMETRY (EM);  Surgeon: Lavena Bullion, DO;  Location: [facility];  Service: Gastroenterology;  Laterality: N/A;    ESOPHAGOGASTRODUODENOSCOPY N/A 07/24/2012   Procedure: ESOPHAGOGASTRODUODENOSCOPY (EGD);  Surgeon: Inda Castle, MD;  Location: [facility];  Service: Endoscopy;  Laterality: N/A;   OVARIAN CYST REMOVAL  2004   laparotomy -left   ROBOTIC ASSISTED LAPAROSCOPIC LYSIS OF ADHESION N/A 03/21/2014   Procedure: ROBOTIC ASSISTED LAPAROSCOPIC EXTENSIVE LYSIS OF ADHESION (1 Hour);  Surgeon: Marvene Staff, MD;  Location: [facility];  Service: Gynecology;  Laterality: N/A;   ROBOTIC ASSISTED SALPINGO OOPHERECTOMY Left 03/21/2014   Procedure:  ROBOTIC ASSISTED LEFT OOPHORECTOMY;  Surgeon: Marvene Staff, MD;  Location: [facility];  Service: Gynecology;  Laterality: Left;   rotator cuff surgery  2017   SHOULDER ARTHROSCOPY Left    TENNIS ELBOW RELEASE/NIRSCHEL PROCEDURE Right 05/01/2015   Procedure: RIGHT ELBOW DEBRIDEMENT AND TENDON REPAIR;  Surgeon: [name], MD;  Location: [facility];  Service: Orthopedics;  Laterality: Right;   UPPER GASTROINTESTINAL ENDOSCOPY     WISDOM TOOTH EXTRACTION     Family History  Problem Relation Age of Onset   Arthritis Mother    Stroke Mother    Hypertension Mother    Heart failure Mother    Dementia Mother    Lung cancer Father    Diabetes Sister    Diabetes Brother  x 2   Breast cancer Maternal Aunt    Throat cancer Maternal Aunt        Smoker   Esophageal cancer Maternal Aunt    Prostate cancer Maternal Uncle        x 2   Diabetes Maternal Uncle    Diabetes Paternal Grandmother    Diabetes Paternal Grandfather    Hypertension Other    Hyperlipidemia Other    Colon cancer Neg Hx    Stomach cancer Neg Hx    Rectal cancer Neg Hx    Colon polyps Neg Hx    Social History   Socioeconomic History   Marital status: Married    Spouse name: Not on file   Number of children: 0   Years of education: Not on file   Highest education level: Not on file  Occupational History   Occupation: RESEARCH    Employer: [employer]  [employer]  Tobacco Use   Smoking status: Never   Smokeless tobacco: Never  Vaping Use   Vaping Use: Never used  Substance and Sexual Activity   Alcohol use: Yes    Alcohol/week: 0.0 standard drinks of alcohol    Comment: occassional   Drug use: No   Sexual activity: Yes    Birth control/protection: None, Surgical    Comment: hysterectomy  Other Topics Concern   Not on file  Social History Narrative   Transport planner    Social Determinants of Health   Financial Resource Strain: Not on file  Food Insecurity: Not on file  Transportation Needs: Not on file  Physical Activity: Not on file  Stress: Not on file  Social Connections: Not on file     Review of Systems  Constitutional:  Negative for appetite change and unexpected weight change.  HENT:  Negative for congestion and sinus pressure.   Respiratory:  Negative for cough, chest tightness and shortness of breath.   Cardiovascular:  Negative for chest pain, palpitations and leg swelling.  Gastrointestinal:        GI symptoms as outlined.    Genitourinary:  Negative for difficulty urinating and dysuria.  Musculoskeletal:  Negative for joint swelling and myalgias.  Skin:  Negative for color change and rash.  Neurological:  Negative for dizziness, light-headedness and headaches.  Psychiatric/Behavioral:  Negative for agitation and dysphoric mood.        Objective:     BP 124/84 (BP Location: Left Arm, Patient Position: Sitting, Cuff Size: Small)   Pulse 65   Temp 98.3 F (36.8 C) (Temporal)   Resp 15   Ht 5' 5"  (1.651 m)   Wt 192 lb 3.2 oz (87.2 kg)   LMP 10/06/2011   SpO2 98%   BMI 31.98 kg/m  Wt Readings from Last 3 Encounters:  10/07/21 192 lb 3.2 oz (87.2 kg)  06/30/21 195 lb 12.8 oz (88.8 kg)  02/23/21 191 lb 9.6 oz (86.9 kg)    Physical Exam Vitals reviewed.  Constitutional:      General: She is not in acute distress.    Appearance: Normal appearance.  HENT:     Head: Normocephalic and atraumatic.      Right Ear: External ear normal.     Left Ear: External ear normal.  Eyes:     General: No scleral icterus.       Right eye: No discharge.        Left eye: No discharge.     Conjunctiva/sclera: Conjunctivae normal.  Neck:     Thyroid:  No thyromegaly.  Cardiovascular:     Rate and Rhythm: Normal rate and regular rhythm.  Pulmonary:     Effort: No respiratory distress.     Breath sounds: Normal breath sounds. No wheezing.  Abdominal:     General: Bowel sounds are normal.     Palpations: Abdomen is soft.     Tenderness: There is no abdominal tenderness.  Musculoskeletal:        General: No swelling or tenderness.     Cervical back: Neck supple. No tenderness.  Lymphadenopathy:     Cervical: No cervical adenopathy.  Skin:    Findings: No erythema or rash.  Neurological:     Mental Status: She is alert.  Psychiatric:        Mood and Affect: Mood normal.        Behavior: Behavior normal.      Outpatient Encounter Medications as of 10/07/2021  Medication Sig   acyclovir (ZOVIRAX) 400 MG tablet TAKE 1 TABLET BY MOUTH 2 TIMES DAILY AS NEEDED.   Calcium Carbonate-Vitamin D 600-400 MG-UNIT tablet Take 1 tablet by mouth daily.   diphenhydrAMINE (BENADRYL) 12.5 MG/5ML elixir Take by mouth as needed.   famotidine (PEPCID) 20 MG tablet Take 1 tablet (20 mg total) by mouth 2 (two) times daily.   ferrous sulfate 325 (65 FE) MG tablet Take 325 mg by mouth daily.    Fluocinolone Acetonide Body 0.01 % OIL Apply to scalp before shampooing. Use for 6-10 hours with the cap each week to every other week.   fluticasone (FLONASE) 50 MCG/ACT nasal spray Place 2 sprays into both nostrils daily. To use in place of your Nasonex   mometasone (NASONEX) 50 MCG/ACT nasal spray PLACE 2 SPRAYS INTO THE NOSE DAILY.   vitamin E 400 UNIT capsule Take 400 Units by mouth daily. Reported on 10/09/2015   Facility-Administered Encounter Medications as of 10/07/2021  Medication   0.9 %  sodium chloride infusion      Lab Results  Component Value Date   WBC 3.3 (L) 07/09/2021   HGB 13.8 07/09/2021   HCT 41.8 07/09/2021   PLT 195.0 07/09/2021   GLUCOSE 89 07/09/2021   CHOL 230 (H) 07/09/2021   TRIG 75.0 07/09/2021   HDL 86.00 07/09/2021   LDLCALC 129 (H) 07/09/2021   ALT 27 07/09/2021   AST 28 07/09/2021   NA 140 07/09/2021   K 4.7 07/09/2021   CL 103 07/09/2021   CREATININE 0.85 07/09/2021   BUN 9 07/09/2021   CO2 29 07/09/2021   TSH 0.81 07/09/2021   HGBA1C 6.3 07/09/2021    MM 3D SCREEN BREAST BILATERAL  Result Date: 10/20/2020 CLINICAL DATA:  Screening. EXAM: DIGITAL SCREENING BILATERAL MAMMOGRAM WITH TOMOSYNTHESIS AND CAD TECHNIQUE: Bilateral screening digital craniocaudal and mediolateral oblique mammograms were obtained. Bilateral screening digital breast tomosynthesis was performed. The images were evaluated with computer-aided detection. COMPARISON:  Previous exam(s). ACR Breast Density Category c: The breast tissue is heterogeneously dense, which may obscure small masses. FINDINGS: There are no findings suspicious for malignancy. IMPRESSION: No mammographic evidence of malignancy. A result letter of this screening mammogram will be mailed directly to the patient. RECOMMENDATION: Screening mammogram in one year. (Code:SM-B-01Y) BI-RADS CATEGORY  1: Negative. Electronically Signed   By: Kristopher Oppenheim M.D.   On: 10/20/2020 16:06       Assessment & Plan:   Problem List Items Addressed This Visit     Barrett's esophagus    Has been followed by GI.  Increased  upper symptoms as outlined.  Refer back to GI for further evaluation and question of need for f/u EGD.  Continue H2blocker.  Previously worked up leukopenia - felt to be related to PPI.  Follow.       Relevant Orders   Ambulatory referral to Gastroenterology   Dysphagia    Previous EGD - s/p dilatation.  Occasional feeling of pills "hanging up".  Continue medications as outlined - H2blocker bid.  Refer to GI as outlined.        Relevant Orders   Ambulatory referral to Gastroenterology   Enlarged thyroid    Previous ultrasound - no nodules.  Follow tsh.       GERD (gastroesophageal reflux disease)    On pepcid bid as outlined.  Refer to GI given upper symptoms as outlined.       Relevant Orders   Ambulatory referral to Gastroenterology   Hip pain    [facility] - PT.  Follow.       History of colonic polyps    Colonoscopy 01/25/21 - Tortuous colon. One 3 mm polyp in the ascending colon, removed with a cold snare. Two 3 mm polyps at the recto-sigmoid colon, removed with a cold snare. Diverticulosis - sigmoid.       Relevant Orders   Ambulatory referral to Gastroenterology   Hypercholesterolemia - Primary    The 10-year ASCVD risk score (Arnett DK, et al., 2019) is: 2.5%   Values used to calculate the score:     Age: 4 years     Sex: Female     Is Non-Hispanic African American: Yes     Diabetic: No     Tobacco smoker: No     Systolic Blood Pressure: 038 mmHg     Is BP treated: No     HDL Cholesterol: 86 mg/dL     Total Cholesterol: 230 mg/dL  Low cholesterol diet and exercise.  Follow lipid panel.        Relevant Orders   Hepatic function panel   Lipid Profile   Basic Metabolic Panel (BMET)   Hyperglycemia    Low carb diet and exercise.  Follow met b and a1c.       Relevant Orders   HgB A1c   Leukopenia    Has been worked up by hematology.  Felt related to PPI.  Follow cbc.  Recheck with net labs.       Relevant Orders   CBC with Differential/Platelet     Einar Pheasant, MD

## 2021-10-11 ENCOUNTER — Encounter: Payer: Self-pay | Admitting: Internal Medicine

## 2021-10-11 NOTE — Assessment & Plan Note (Signed)
Has been followed by GI.  Increased upper symptoms as outlined.  Refer back to GI for further evaluation and question of need for f/u EGD.  Continue H2blocker.  Previously worked up leukopenia - felt to be related to PPI.  Follow.

## 2021-10-11 NOTE — Assessment & Plan Note (Signed)
Low carb diet and exercise.  Follow met b and a1c.  

## 2021-10-11 NOTE — Assessment & Plan Note (Signed)
Has been worked up by hematology.  Felt related to PPI.  Follow cbc.  Recheck with net labs.  

## 2021-10-11 NOTE — Assessment & Plan Note (Signed)
The 10-year ASCVD risk score (Arnett DK, et al., 2019) is: 2.5%   Values used to calculate the score:     Age: 56 years     Sex: Female     Is Non-Hispanic African American: Yes     Diabetic: No     Tobacco smoker: No     Systolic Blood Pressure: 709 mmHg     Is BP treated: No     HDL Cholesterol: 86 mg/dL     Total Cholesterol: 230 mg/dL  Low cholesterol diet and exercise.  Follow lipid panel.

## 2021-10-11 NOTE — Assessment & Plan Note (Signed)
Previous ultrasound - no nodules.  Follow tsh.  

## 2021-10-11 NOTE — Assessment & Plan Note (Signed)
On pepcid bid as outlined.  Refer to GI given upper symptoms as outlined.

## 2021-10-11 NOTE — Assessment & Plan Note (Signed)
Previous EGD - s/p dilatation.  Occasional feeling of pills "hanging up".  Continue medications as outlined - H2blocker bid.  Refer to GI as outlined.

## 2021-10-11 NOTE — Assessment & Plan Note (Signed)
[facility] - PT.  Follow.

## 2021-10-11 NOTE — Assessment & Plan Note (Signed)
Colonoscopy 01/25/21 - Tortuous colon. One 3 mm polyp in the ascending colon, removed with a cold snare. Two 3 mm polyps at the recto-sigmoid colon, removed with a cold snare. Diverticulosis - sigmoid.  

## 2021-10-21 ENCOUNTER — Ambulatory Visit
Admission: RE | Admit: 2021-10-21 | Discharge: 2021-10-21 | Disposition: A | Discharge status: Home / Self Care | Payer: 59 | Source: Ambulatory Visit | Attending: Internal Medicine | Admitting: Internal Medicine

## 2021-10-21 DIAGNOSIS — Z1231 Encounter for screening mammogram for malignant neoplasm of breast: Secondary | ICD-10-CM

## 2021-11-01 ENCOUNTER — Other Ambulatory Visit: Payer: Self-pay | Admitting: Internal Medicine

## 2021-11-04 ENCOUNTER — Other Ambulatory Visit (INDEPENDENT_AMBULATORY_CARE_PROVIDER_SITE_OTHER): Payer: 59

## 2021-11-04 ENCOUNTER — Encounter: Payer: Self-pay | Admitting: Internal Medicine

## 2021-11-04 DIAGNOSIS — D72819 Decreased white blood cell count, unspecified: Secondary | ICD-10-CM

## 2021-11-04 DIAGNOSIS — R739 Hyperglycemia, unspecified: Secondary | ICD-10-CM | POA: Diagnosis not present

## 2021-11-04 DIAGNOSIS — E7800 Pure hypercholesterolemia, unspecified: Secondary | ICD-10-CM

## 2021-11-04 LAB — HEPATIC FUNCTION PANEL
ALT: 16 U/L (ref 0–35)
AST: 18 U/L (ref 0–37)
Albumin: 4.4 g/dL (ref 3.5–5.2)
Alkaline Phosphatase: 71 U/L (ref 39–117)
Bilirubin, Direct: 0.1 mg/dL (ref 0.0–0.3)
Total Bilirubin: 0.6 mg/dL (ref 0.2–1.2)
Total Protein: 7.4 g/dL (ref 6.0–8.3)

## 2021-11-04 LAB — CBC WITH DIFFERENTIAL/PLATELET
Basophils Absolute: 0 10*3/uL (ref 0.0–0.1)
Basophils Relative: 1.5 % (ref 0.0–3.0)
Eosinophils Absolute: 0 10*3/uL (ref 0.0–0.7)
Eosinophils Relative: 1 % (ref 0.0–5.0)
HCT: 42.1 % (ref 36.0–46.0)
Hemoglobin: 14.1 g/dL (ref 12.0–15.0)
Lymphocytes Relative: 55 % — ABNORMAL HIGH (ref 12.0–46.0)
Lymphs Abs: 1.5 10*3/uL (ref 0.7–4.0)
MCHC: 33.5 g/dL (ref 30.0–36.0)
MCV: 91.4 fl (ref 78.0–100.0)
Monocytes Absolute: 0.3 10*3/uL (ref 0.1–1.0)
Monocytes Relative: 10.8 % (ref 3.0–12.0)
Neutro Abs: 0.8 10*3/uL — ABNORMAL LOW (ref 1.4–7.7)
Neutrophils Relative %: 31.7 % — ABNORMAL LOW (ref 43.0–77.0)
Platelets: 209 10*3/uL (ref 150.0–400.0)
RBC: 4.6 Mil/uL (ref 3.87–5.11)
RDW: 14.2 % (ref 11.5–15.5)
WBC: 2.6 10*3/uL — ABNORMAL LOW (ref 4.0–10.5)

## 2021-11-04 LAB — BASIC METABOLIC PANEL
BUN: 9 mg/dL (ref 6–23)
CO2: 29 mEq/L (ref 19–32)
Calcium: 9.3 mg/dL (ref 8.4–10.5)
Chloride: 103 mEq/L (ref 96–112)
Creatinine, Ser: 0.93 mg/dL (ref 0.40–1.20)
GFR: 68.77 mL/min (ref >60.00)
Glucose, Bld: 99 mg/dL (ref 70–99)
Potassium: 4.8 mEq/L (ref 3.5–5.1)
Sodium: 139 mEq/L (ref 135–145)

## 2021-11-04 LAB — LIPID PANEL
Cholesterol: 212 mg/dL — ABNORMAL HIGH (ref 0–200)
HDL: 81.2 mg/dL (ref >39.00)
LDL Cholesterol: 119 mg/dL — ABNORMAL HIGH (ref 0–99)
NonHDL: 131.14
Total CHOL/HDL Ratio: 3
Triglycerides: 62 mg/dL (ref 0.0–149.0)
VLDL: 12.4 mg/dL (ref 0.0–40.0)

## 2021-11-04 LAB — HEMOGLOBIN A1C: Hgb A1c MFr Bld: 6.1 % (ref 4.6–6.5)

## 2021-11-12 ENCOUNTER — Telehealth: Payer: Self-pay | Admitting: Internal Medicine

## 2021-11-12 ENCOUNTER — Other Ambulatory Visit: Payer: Self-pay

## 2021-11-12 DIAGNOSIS — D72819 Decreased white blood cell count, unspecified: Secondary | ICD-10-CM

## 2021-11-12 NOTE — Telephone Encounter (Signed)
Patient has a lab appt 11/19/21, there are no orders in.

## 2021-11-12 NOTE — Telephone Encounter (Signed)
CBC ordered 

## 2021-11-19 ENCOUNTER — Other Ambulatory Visit (INDEPENDENT_AMBULATORY_CARE_PROVIDER_SITE_OTHER): Payer: 59

## 2021-11-19 ENCOUNTER — Encounter: Payer: Self-pay | Admitting: Internal Medicine

## 2021-11-19 DIAGNOSIS — D72819 Decreased white blood cell count, unspecified: Secondary | ICD-10-CM

## 2021-11-19 LAB — CBC WITH DIFFERENTIAL/PLATELET
Basophils Absolute: 0 10*3/uL (ref 0.0–0.1)
Basophils Relative: 1.4 % (ref 0.0–3.0)
Eosinophils Absolute: 0 10*3/uL (ref 0.0–0.7)
Eosinophils Relative: 0.9 % (ref 0.0–5.0)
HCT: 42.7 % (ref 36.0–46.0)
Hemoglobin: 14.3 g/dL (ref 12.0–15.0)
Lymphocytes Relative: 54.7 % — ABNORMAL HIGH (ref 12.0–46.0)
Lymphs Abs: 1.4 10*3/uL (ref 0.7–4.0)
MCHC: 33.5 g/dL (ref 30.0–36.0)
MCV: 91.1 fl (ref 78.0–100.0)
Monocytes Absolute: 0.3 10*3/uL (ref 0.1–1.0)
Monocytes Relative: 10.9 % (ref 3.0–12.0)
Neutro Abs: 0.8 10*3/uL — ABNORMAL LOW (ref 1.4–7.7)
Neutrophils Relative %: 32.1 % — ABNORMAL LOW (ref 43.0–77.0)
Platelets: 190 10*3/uL (ref 150.0–400.0)
RBC: 4.68 Mil/uL (ref 3.87–5.11)
RDW: 14.3 % (ref 11.5–15.5)
WBC: 2.5 10*3/uL — ABNORMAL LOW (ref 4.0–10.5)

## 2021-11-23 ENCOUNTER — Other Ambulatory Visit: Payer: Self-pay

## 2021-11-23 ENCOUNTER — Telehealth: Payer: Self-pay

## 2021-11-23 MED ORDER — FAMOTIDINE 20 MG PO TABS: 20.0000 mg | ORAL_TABLET | Freq: Two times a day (BID) | ORAL | 1 refills | Status: DC

## 2021-11-23 NOTE — Telephone Encounter (Signed)
Pt called and made aware that refill was sent.

## 2021-11-23 NOTE — Telephone Encounter (Signed)
Patient states she would like to have her famotidine (PEPCID) 20 MG tablet refilled.  Patient states she ran out of this medication on Saturday (11/21/2021).  Patient asked that we please call her when this has been sent to the pharmacy.  *[facility] states [facility] [place].

## 2021-11-26 ENCOUNTER — Telehealth: Payer: Self-pay

## 2021-11-26 NOTE — Telephone Encounter (Signed)
See result note.  

## 2021-11-26 NOTE — Telephone Encounter (Signed)
Pt returning call

## 2021-11-26 NOTE — Telephone Encounter (Signed)
LMTCB to make sure that message was read to her from Dr. Nicki Reaper, but she has viewed on her mychart.

## 2022-01-06 NOTE — Progress Notes (Deleted)
01/06/2022 Kelly Massey [id number] May 21, 1965  Referring provider: Einar Pheasant, MD Primary GI doctor: Dr. Havery Moros  ASSESSMENT AND PLAN:   Assessment: 56 y.o. female here for assessment of the following: No diagnosis found.  Plan:  No orders of the defined types were placed in this encounter.   No orders of the defined types were placed in this encounter.   History of Present Illness:  56 y.o. female  with a past medical history of arthritis, asthma, hyperlipidemia, iron deficiency anemia, GERD and colon polyps and others listed below, returns to clinic today for evaluation of GERD.  08/14/2020 office visit with NP Berniece Pap for r sore throat, cough and recall colonoscopy. Referred to ENT for evaluation.  10/12/2010 colonoscopy with 4 hyperplastic polyps rectosigmoid and rectum 10/13/2015 barium swallow showed small reducible hiatal hernia with small amount of gastroesophageal reflux no evidence of stricture or esophagitis 04/02/2019 EGD normal esophagus, 1 to 2 cm sliding hiatal hernia, esophagus was empirically dilated. 07/25/2019 esophageal manometry was normal and pH impedance study showed very few episodes of acid reflux therefore TIF was not recommended.  Did show a high number of episodes of belching but very few actually associate with reflux. 01/05/2021 colonoscopy good prep, tortuous colon, 3 mm polyp ascending colon and rectosigmoid colon, diverticulosis sigmoid colon.  1 polyp was tubular adenoma, recall 7 years. ***  ? RUQ [place] ?UGI ? Functional can try>>   She  reports that she has never smoked. She has never used smokeless tobacco. She reports current alcohol use. She reports that she does not use drugs. Her family history includes Arthritis in her mother; Breast cancer in her maternal aunt; Dementia in her mother; Diabetes in her brother, maternal uncle, paternal grandfather, paternal grandmother, and sister; Esophageal cancer in her maternal aunt;  Heart failure in her mother; Hyperlipidemia in an other family member; Hypertension in her mother and another family member; Lung cancer in her father; Prostate cancer in her maternal uncle; Stroke in her mother; Throat cancer in her maternal aunt.   Current Medications:       Current Outpatient Medications (Respiratory):    diphenhydrAMINE (BENADRYL) 12.5 MG/5ML elixir, Take by mouth as needed.   fluticasone (FLONASE) 50 MCG/ACT nasal spray, Place 2 sprays into both nostrils daily. To use in place of your Nasonex   mometasone (NASONEX) 50 MCG/ACT nasal spray, PLACE 2 SPRAYS INTO THE NOSE DAILY.     Current Outpatient Medications (Hematological):    ferrous sulfate 325 (65 FE) MG tablet, Take 325 mg by mouth daily.    Current Outpatient Medications (Other):    acyclovir (ZOVIRAX) 400 MG tablet, TAKE 1 TABLET BY MOUTH TWICE A DAY AS NEEDED   Calcium Carbonate-Vitamin D 600-400 MG-UNIT tablet, Take 1 tablet by mouth daily.   famotidine (PEPCID) 20 MG tablet, Take 1 tablet (20 mg total) by mouth 2 (two) times daily.   Fluocinolone Acetonide Body 0.01 % OIL, Apply to scalp before shampooing. Use for 6-10 hours with the cap each week to every other week.   vitamin E 400 UNIT capsule, Take 400 Units by mouth daily. Reported on 10/09/2015  Current Facility-Administered Medications (Other):    0.9 %  sodium chloride infusion  Surgical History:  She  has a past surgical history that includes Shoulder arthroscopy (Left); Dilation and curettage of uterus; Abdominal hysterectomy; Ovarian cyst removal (2004); Esophagogastroduodenoscopy (N/A, 07/24/2012); BRAVO ph study (N/A, 07/24/2012); Balloon dilation (N/A, 07/24/2012); Wisdom tooth extraction; Robotic assisted salpingo oophorectomy (Left, 03/21/2014);  Robotic assisted laparoscopic lysis of adhesion (N/A, 03/21/2014); Tennis elbow release/nirschel procedure (Right, 05/01/2015); rotator cuff surgery (2017); Esophageal manometry (N/A, 07/25/2019);  24 hour ph study (N/A, 07/25/2019); Colonoscopy; and Upper gastrointestinal endoscopy.  Current Medications, Allergies, Past Medical History, Past Surgical History, Family History and Social History were reviewed in [employer] record.  Physical Exam: LMP 10/06/2011  General:   Pleasant, well developed female in no acute distress Heart : Regular rate and rhythm; no murmurs Pulm: Clear anteriorly; no wheezing Abdomen:  {BlankSingle:19197::"Distended","Ridged","Soft"}, {BlankSingle:19197::"Flat","Obese","Non-distended"} AB, {BlankSingle:19197::"Absent","Hyperactive, tinkling","Hypoactive","Sluggish","Active"} bowel sounds. {actendernessAB:27319} tenderness {anatomy; site abdomen:5010}. {BlankMultiple:19196::"Without guarding","With guarding","Without rebound","With rebound"}, No organomegaly appreciated. Rectal: {acrectalexam:27461} Extremities:  {With/without:5700}  edema. Neurologic:  Alert and  oriented x4;  No focal deficits.  Psych:  Cooperative. Normal mood and affect.   Vladimir Crofts, PA-C 01/06/22

## 2022-01-11 ENCOUNTER — Ambulatory Visit: Payer: 59 | Admitting: Physician Assistant

## 2022-01-12 ENCOUNTER — Ambulatory Visit: Payer: 59 | Admitting: Nurse Practitioner

## 2022-01-12 ENCOUNTER — Encounter: Payer: Self-pay | Admitting: Nurse Practitioner

## 2022-01-12 VITALS — BP 124/80 | HR 66 | Ht 65.5 in | Wt 190.4 lb

## 2022-01-12 DIAGNOSIS — R1310 Dysphagia, unspecified: Secondary | ICD-10-CM | POA: Diagnosis not present

## 2022-01-12 DIAGNOSIS — K219 Gastro-esophageal reflux disease without esophagitis: Secondary | ICD-10-CM

## 2022-01-12 MED ORDER — FAMOTIDINE 40 MG PO TABS: 40.0000 mg | ORAL_TABLET | Freq: Two times a day (BID) | ORAL | 3 refills | Status: DC

## 2022-01-12 NOTE — Patient Instructions (Addendum)
You have been scheduled for an endoscopy. Please follow written instructions given to you at your visit today. If you use inhalers (even only as needed), please bring them with you on the day of your procedure.   A new prescription for Famotidine '40mg'$  one tab to be taken twice daily was sent to your pharmacy  Contact our office if your reflux or difficulty swallowing symptoms worsen   Due to recent changes in healthcare laws, you may see the results of your imaging and laboratory studies on MyChart before your provider has had a chance to review them.  We understand that in some cases there may be results that are confusing or concerning to you. Not all laboratory results come back in the same time frame and the provider may be waiting for multiple results in order to interpret others.  Please give [place] 48 hours in order for your provider to thoroughly review all the results before contacting the office for clarification of your results.    It was a pleasure to see you today!  Thank you for trusting me with your gastrointestinal care!

## 2022-01-12 NOTE — Progress Notes (Signed)
01/12/2022 Kelly Massey [id number] 15-May-1965   Chief Complaint: Worsening acid reflux, difficulty swallowing  History of Present Illness:  Kelly Massey is a 56 year old female with a past medical history of arthritis, asthma, hyperlipidemia, chronic leukopenia, iron deficiency anemia, dysphagia, GERD and colon polyps.  She complains of having active reflux symptoms for the past 2 months.  She describes feeling burning acid in her esophagus up into her throat.  She is taking Pepcid 20 mg twice daily and Mylanta as needed without relief.  She reported taking numerous PPIs in the past which were ineffective.  She also has recurrent dysphagia.  She feels as if food briefly gets stuck to the upper esophagus, she drinks water and the stuck food goes down.  She feels nauseous during these episodes but does not vomit.  She cuts her food into small pieces and chews food slowly and thoroughly.  She has also noticed voice hoarseness for the past month.  No NSAIDs.  No alcohol use.  She is passing normal formed brown bowel movement daily.  No rectal bleeding or black stools.  Her most recent EGD was 04/02/2019 which showed a 1 to 2 cm hiatal hernia, the esophagus was normal and was empirically dilated to 18 mm.  She stated her dysphagia symptoms improved following esophageal dilatation.  She has undergone numerous EGDs with esophageal dilatations in the past.  She underwent a pH impedance study 07/2019 which showed few reflux episodes triggered by excessive belching.     Latest Ref Rng & Units 11/19/2021    7:33 AM 11/04/2021    7:57 AM 07/09/2021    7:34 AM  CBC  WBC 4.0 - 10.5 K/uL 2.5  2.6  3.3   Hemoglobin 12.0 - 15.0 g/dL 14.3  14.1  13.8   Hematocrit 36.0 - 46.0 % 42.7  42.1  41.8   Platelets 150.0 - 400.0 K/uL 190.0  209.0  195.0        Latest Ref Rng & Units 11/04/2021    7:57 AM 07/09/2021    7:34 AM 12/31/2020    7:47 AM  CMP  Glucose 70 - 99 mg/dL 99  89  107   BUN 6 - 23 mg/dL '9  9   7   '$ Creatinine 0.40 - 1.20 mg/dL 0.93  0.85  0.83   Sodium 135 - 145 mEq/L 139  140  140   Potassium 3.5 - 5.1 mEq/L 4.8  4.7  4.9   Chloride 96 - 112 mEq/L 103  103  103   CO2 19 - 32 mEq/L '29  29  31   '$ Calcium 8.4 - 10.5 mg/dL 9.3  9.7  9.6   Total Protein 6.0 - 8.3 g/dL 7.4  7.5  6.9   Total Bilirubin 0.2 - 1.2 mg/dL 0.6  0.6  0.7   Alkaline Phos 39 - 117 U/L 71  74  64   AST 0 - 37 U/L '18  28  13   '$ ALT 0 - 35 U/L '16  27  11      '$ Colonoscopy 01/05/2021: - Tortuous colon. - One 3 mm polyp in the ascending colon, removed with a cold snare. Resected and retrieved. - Two 3 mm polyps at the recto-sigmoid colon, removed with a cold snare. Resected andretrieved. - Diverticulosis in the sigmoid colon. - The examination was otherwise normal. - 7 year colonoscopy recall   1. Surgical [P], colon, rectosigmoid x1, polyp (1) - HYPERPLASTIC POLYP 2. Surgical [P],  colon, ascending x1, polyp (1) - TUBULAR ADENOMA - NEGATIVE FOR HIGH-GRADE DYSPLASIA OR MALIGNANCY  PH impedence study 07/25/2019: 1.  Normal esophageal acid exposure with % time < 4 of 0.5% 2.  Esophageal acid exposure is normal in upright and supine position 3.  Overall reflux events within normal limits 4   Positive symptom correlation for reflux events associated with belching based on symptom association probability Impression No evidence of significant pathogenic gastroesophageal acid reflux Very few reflux episodes were noted and were predominantly triggered by excessive belching   EGD 04/02/2019: - Esophagogastric landmarks identified. - 1-2 cm sliding hiatal hernia. - Normal esophagus otherwise - empiric dilation performed to 45m - Normal stomach. Hill grade III views of the cardia. - Normal duodenal bulb and second portion of the duodenum.  Barium swallow study 10/13/2015: Small reducible hiatal hernia with small amount of gastroesophageal reflux. There is no evidence of a stricture nor of esophagitis.  EGD  01/21/2015: Normal appearing esophagus. Biopsies obtained to rule out EoE. Empiric dilation performed with 154mTTS balloon given prior positive response to dilation, in which no resistance was noted and no mucosal wrent appreciated Benign appearing small gastric polyp, removed Normal remainder of examined stomach, biopsies obtained to rule out H pylori. Normal duodenum  EGD 07/24/2012: GERD Esophageal dilatation   EGD 10/12/2010: -Normal stomach -Normal duodenum   Colonoscopy 10/12/2010: 4 polyps removed from the recto sigmoid and rectum    Part A: GEJ BIOPSY:  - SQUAMOCOLUMNAR MUCOSA WITH REFLUX GASTROESOPHAGITIS.  - NEGATIVE FOR GOBLET CELLS, DYSPLASIA AND MALIGNANCY.  .  Part B: RECTO-SIGMOID JUNCTION POLYP X 2 COLD BIOPSY:  - HYPERPLASTIC POLYP (2).  - NEGATIVE FOR DYSPLASIA AND MALIGNANCY.  .  Part C: RECTAL VAULT POLYPS X 2 COLD BIOPSY:  - HYPERPLASTIC POLYP (4).  - NEGATIVE FOR DYSPLASIA AND MALIGNANCY   Current Outpatient Medications on File Prior to Visit  Medication Sig Dispense Refill   acyclovir (ZOVIRAX) 400 MG tablet TAKE 1 TABLET BY MOUTH TWICE A DAY AS NEEDED 60 tablet 1   Calcium Carbonate-Vitamin D 600-400 MG-UNIT tablet Take 1 tablet by mouth daily.     diphenhydrAMINE (BENADRYL) 12.5 MG/5ML elixir Take by mouth as needed.     ferrous sulfate 325 (65 FE) MG tablet Take 325 mg by mouth daily.      Fluocinolone Acetonide Body 0.01 % OIL Apply to scalp before shampooing. Use for 6-10 hours with the cap each week to every other week.     fluticasone (FLONASE) 50 MCG/ACT nasal spray Place 2 sprays into both nostrils daily. To use in place of your Nasonex 16 mL 0   mometasone (NASONEX) 50 MCG/ACT nasal spray PLACE 2 SPRAYS INTO THE NOSE DAILY. 51 each 1   vitamin E 400 UNIT capsule Take 400 Units by mouth daily. Reported on 10/09/2015     Current Facility-Administered Medications on File Prior to Visit  Medication Dose Route Frequency Provider Last Rate Last Admin    0.9 %  sodium chloride infusion  500 mL Intravenous Once Armbruster, StCarlota RaspberryMD       Allergies  Allergen Reactions   Adhesive [Tape] Other (See Comments)    Skin appeared "burned" after last surg.   Aspirin Nausea And Vomiting    Other reaction(s): GI Upset (intolerance), Other (See Comments) Causes stomach cramps per patient Severe intolerance to Aspirin products   Ibuprofen Nausea And Vomiting    Other reaction(s): Other (See Comments) Severe intolerance   Penicillins Diarrhea  Nsaids Other (See Comments)    GI pain   Oxycodone Other (See Comments)    Severe abdominal cramps   Shrimp [Shellfish Allergy] Nausea And Vomiting   Current Medications, Allergies, Past Medical History, Past Surgical History, Family History and Social History were reviewed in [employer] record.  Review of Systems:   Constitutional: Negative for fever, sweats, chills or weight loss.  Respiratory: Negative for shortness of breath.   Cardiovascular: Negative for chest pain, palpitations and leg swelling.  Gastrointestinal: See HPI.  Musculoskeletal: Negative for back pain or muscle aches.  Neurological: Negative for dizziness, headaches or paresthesias.   Physical Exam: BP 124/80   Pulse 66   Ht 5' 5.5" (1.664 m)   Wt 190 lb 6.4 oz (86.4 kg)   LMP 10/06/2011   BMI 31.20 kg/m  General: 56 year old female in no acute distress. Head: Normocephalic and atraumatic. Eyes: No scleral icterus. Conjunctiva pink . Ears: Normal auditory acuity. Mouth: Dentition intact. No ulcers or lesions.  Lungs: Clear throughout to auscultation. Heart: Regular rate and rhythm, no murmur. Abdomen: Soft, nontender and nondistended. No masses or hepatomegaly. Normal bowel sounds x 4 quadrants.  Rectal: Deferred. Musculoskeletal: Symmetrical with no gross deformities. Extremities: No edema. Neurological: Alert oriented x 4. No focal deficits.  Psychological: Alert and cooperative. Normal  mood and affect  Assessment and Recommendations:  43) 56 year old female with a history of GERD and worsening reflux symptoms for the past 2 months recurrent dysphagia status post multiple empiric esophageal dilatations without any identifiable esophageal stenosis. Her most recent EGD was 04/02/2019 which showed a 1 to 2 cm hiatal hernia, the esophagus was normal and was empirically dilated to 18 mm.  On Pepcid 20 mg twice daily.  Patient reported PPI therapy in the past was ineffective. -EGD with possible esophageal dilatation benefits and risks discussed including risk with sedation, risk of bleeding, perforation and infection  -Increase Famotidine to 40 mg 1 tab p.o. twice daily -Mylanta as needed -Maintain a GERD diet -Patient to contact office if symptoms worsen  2) History of colon polyps.  Colonoscopy 01/05/2021 identified 3 tubular adenomatous/hyperplastic polyps removed from the a sending and rectosigmoid colon.  -Next colonoscopy due 01/2028

## 2022-01-12 NOTE — Progress Notes (Signed)
Agree with assessment and plan as outlined. If EGDs with dilation have helped her dysphagia in the past that I think okay to proceed with an EGD as its been a little bit since her last one. Of note if she has any availability next week I do have several openings and consider in the schedule then.  Thanks

## 2022-01-13 ENCOUNTER — Telehealth: Payer: Self-pay

## 2022-01-13 NOTE — Telephone Encounter (Signed)
Okay thanks [employer]

## 2022-01-13 NOTE — Progress Notes (Signed)
Kelly Massey, refer to Dr. Coralyn Mark as follows: Armbruster, Carlota Raspberry, MD  Noralyn Pick, NP Jaclyn Shaggy, I am fine with an EGD as dilation has helped her in the past.  Was curious if there is any weight the staff can call her to see if she can do a procedure next week in one of my openings.  If possible for her to do it next week that would be preferable as these spots will likely go unfilled given I'm in the hospital all week.  Thank you         Kelly Massey, pls verify if Dr. Havery Moros still has available spots next week in [place], I so, please see if patient is willing to have her EGD next week. Thx

## 2022-01-13 NOTE — Telephone Encounter (Signed)
Pt was made aware of Dr. Havery Moros availability to do her EGD next week: Pt stated that she is not sure if she could due next week due to her husbands schedule: Pt stated that she wanted to stay with her previously scheduled  date on 01/28/2022: Pt verbalized understanding with all questions answered.

## 2022-01-13 NOTE — Telephone Encounter (Signed)
-----   Message from Noralyn Pick, NP sent at 01/13/2022  1:52 PM EDT -----    ----- Message ----- From: Yetta Flock, MD Sent: 01/12/2022   6:08 PM EDT To: Noralyn Pick, NP  Colleen, I am fine with an EGD as dilation has helped her in the past. Was curious if there is any weight the staff can call her to see if she can do a procedure next week in one of my openings.  If possible for her to do it next week that would be preferable as these spots will likely go unfilled given I'm in the hospital all week.  Thank you   ----- Message ----- From: Noralyn Pick, NP Sent: 01/12/2022   4:07 PM EDT To: Yetta Flock, MD  Dr. Havery Moros, I scheduled her for a repeat EGD with possible esophageal dilatation but me know if you have other recommendations as she has undergone numerous EGDs and esophageal dilatations in the past.  Patient was informed I would communicate with you prior to her proceeding with another EGD.  Thank you for your input.

## 2022-01-20 ENCOUNTER — Encounter: Payer: Self-pay | Admitting: Internal Medicine

## 2022-01-20 ENCOUNTER — Other Ambulatory Visit (HOSPITAL_COMMUNITY)
Admission: RE | Admit: 2022-01-20 | Discharge: 2022-01-20 | Disposition: A | Discharge status: Home / Self Care | Payer: 59 | Source: Ambulatory Visit | Attending: Internal Medicine | Admitting: Internal Medicine

## 2022-01-20 ENCOUNTER — Ambulatory Visit: Payer: 59 | Admitting: Internal Medicine

## 2022-01-20 ENCOUNTER — Encounter: Payer: 59 | Admitting: Gastroenterology

## 2022-01-20 VITALS — BP 126/70 | HR 81 | Temp 98.2°F | Ht 65.5 in | Wt 192.4 lb

## 2022-01-20 DIAGNOSIS — N898 Other specified noninflammatory disorders of vagina: Secondary | ICD-10-CM | POA: Diagnosis present

## 2022-01-20 DIAGNOSIS — K219 Gastro-esophageal reflux disease without esophagitis: Secondary | ICD-10-CM | POA: Diagnosis not present

## 2022-01-20 DIAGNOSIS — R300 Dysuria: Secondary | ICD-10-CM | POA: Insufficient documentation

## 2022-01-20 LAB — URINALYSIS, ROUTINE W REFLEX MICROSCOPIC
Bilirubin Urine: NEGATIVE
Hgb urine dipstick: NEGATIVE
Ketones, ur: NEGATIVE
Leukocytes,Ua: NEGATIVE
Nitrite: NEGATIVE
RBC / HPF: NONE SEEN (ref 0–?)
Specific Gravity, Urine: 1.02 (ref 1.000–1.030)
Total Protein, Urine: NEGATIVE
Urine Glucose: NEGATIVE
Urobilinogen, UA: 0.2 (ref 0.0–1.0)
pH: 6 (ref 5.0–8.0)

## 2022-01-20 NOTE — Progress Notes (Signed)
Patient ID: Kelly Massey, female   DOB: 1966/03/20, 56 y.o.   MRN: [id number]   Subjective:    Patient ID: Kelly Massey, female    DOB: 21-Aug-1965, 56 y.o.   MRN: [id number]   Patient here for  Chief Complaint  Patient presents with   Vaginitis   .   HPI Work in with concerns regarding yeast infection. Reports symptoms started last week.  Noticed increased itching.  No increased discharge.  Used monistat.  Noticed some increased burning with monistat.  Also noticed some burning with urination.  Some increased frequency, but this is unchanged.  No hematuria.  Noticed vaginal tissue - red. No abdominal pain.  No vomiting.  Saw GI 01/12/22 - worsening acid reflux and dysphagia.  Planning for EGD 01/28/22. Increased famotidine to '40mg'$  bid.  No fever.    Past Medical History:  Diagnosis Date   Allergy    Anemia    Arthritis    knees - no meds   Colon polyps    HPP_ 2012   GERD (gastroesophageal reflux disease) 10/12/2010   EGD, positive H. pylori   HSV infection    History   Hyperlipidemia    ? no meds - diet controlled   Post-operative nausea and vomiting    Seasonal allergies    Past Surgical History:  Procedure Laterality Date   26 HOUR [place] STUDY N/A 07/25/2019   Procedure: 24 HOUR [place] STUDY;  Surgeon: Lavena Bullion, DO;  Location: [facility];  Service: Gastroenterology;  Laterality: N/A;   ABDOMINAL HYSTERECTOMY     BALLOON DILATION N/A 07/24/2012   Procedure: BALLOON DILATION;  Surgeon: Inda Castle, MD;  Location: [facility];  Service: Endoscopy;  Laterality: N/A;   BRAVO [place] STUDY N/A 07/24/2012   Procedure: BRAVO [place] STUDY;  Surgeon: Inda Castle, MD;  Location: [facility];  Service: Endoscopy;  Laterality: N/A;   COLONOSCOPY     DILATION AND CURETTAGE OF UTERUS     SAB   ESOPHAGEAL MANOMETRY N/A 07/25/2019   Procedure: ESOPHAGEAL MANOMETRY (EM);  Surgeon: Lavena Bullion, DO;  Location: [facility];  Service: Gastroenterology;  Laterality: N/A;    ESOPHAGOGASTRODUODENOSCOPY N/A 07/24/2012   Procedure: ESOPHAGOGASTRODUODENOSCOPY (EGD);  Surgeon: Inda Castle, MD;  Location: [facility];  Service: Endoscopy;  Laterality: N/A;   OVARIAN CYST REMOVAL  2004   laparotomy -left   ROBOTIC ASSISTED LAPAROSCOPIC LYSIS OF ADHESION N/A 03/21/2014   Procedure: ROBOTIC ASSISTED LAPAROSCOPIC EXTENSIVE LYSIS OF ADHESION (1 Hour);  Surgeon: Marvene Staff, MD;  Location: [facility];  Service: Gynecology;  Laterality: N/A;   ROBOTIC ASSISTED SALPINGO OOPHERECTOMY Left 03/21/2014   Procedure:  ROBOTIC ASSISTED LEFT OOPHORECTOMY;  Surgeon: Marvene Staff, MD;  Location: [facility];  Service: Gynecology;  Laterality: Left;   rotator cuff surgery  2017   SHOULDER ARTHROSCOPY Left    TENNIS ELBOW RELEASE/NIRSCHEL PROCEDURE Right 05/01/2015   Procedure: RIGHT ELBOW DEBRIDEMENT AND TENDON REPAIR;  Surgeon: [name], MD;  Location: [facility];  Service: Orthopedics;  Laterality: Right;   UPPER GASTROINTESTINAL ENDOSCOPY     WISDOM TOOTH EXTRACTION     Family History  Problem Relation Age of Onset   Arthritis Mother    Stroke Mother    Hypertension Mother    Heart failure Mother    Dementia Mother    Lung cancer Father    Diabetes Sister    Diabetes Brother        x  2   Breast cancer Maternal Aunt    Throat cancer Maternal Aunt        Smoker   Esophageal cancer Maternal Aunt    Prostate cancer Maternal Uncle        x 2   Diabetes Maternal Uncle    Diabetes Paternal Grandmother    Diabetes Paternal Grandfather    Hypertension Other    Hyperlipidemia Other    Colon cancer Neg Hx    Stomach cancer Neg Hx    Rectal cancer Neg Hx    Colon polyps Neg Hx    Social History   Socioeconomic History   Marital status: Married    Spouse name: Not on file   Number of children: 0   Years of education: Not on file   Highest education level: Not on file  Occupational History   Occupation: RESEARCH    Employer:  [employer]  Tobacco Use   Smoking status: Never   Smokeless tobacco: Never  Vaping Use   Vaping Use: Never used  Substance and Sexual Activity   Alcohol use: Yes    Alcohol/week: 0.0 standard drinks of alcohol    Comment: occassional   Drug use: No   Sexual activity: Yes    Birth control/protection: None, Surgical    Comment: hysterectomy  Other Topics Concern   Not on file  Social History Narrative   Transport planner    Social Determinants of Health   Financial Resource Strain: Not on file  Food Insecurity: Not on file  Transportation Needs: Not on file  Physical Activity: Not on file  Stress: Not on file  Social Connections: Not on file     Review of Systems  Constitutional:  Negative for appetite change and unexpected weight change.  HENT:  Negative for congestion and sinus pressure.   Respiratory:  Negative for cough, chest tightness and shortness of breath.   Cardiovascular:  Negative for chest pain, palpitations and leg swelling.  Gastrointestinal:  Negative for abdominal pain, diarrhea, nausea and vomiting.  Genitourinary:  Positive for frequency. Negative for difficulty urinating and dysuria.       Vaginal irritation as outlined.  No increased discharge.   Musculoskeletal:  Negative for joint swelling and myalgias.  Skin:  Negative for color change and rash.  Neurological:  Negative for dizziness, light-headedness and headaches.  Psychiatric/Behavioral:  Negative for agitation and dysphoric mood.        Objective:     BP 126/70 (BP Location: Left Arm, Patient Position: Sitting, Cuff Size: Normal)   Pulse 81   Temp 98.2 F (36.8 C) (Oral)   Ht 5' 5.5" (1.664 m)   Wt 192 lb 6.4 oz (87.3 kg)   LMP 10/06/2011   SpO2 99%   BMI 31.53 kg/m  Wt Readings from Last 3 Encounters:  01/20/22 192 lb 6.4 oz (87.3 kg)  01/12/22 190 lb 6.4 oz (86.4 kg)  10/07/21 192 lb 3.2 oz (87.2 kg)    Physical Exam Vitals reviewed.  Constitutional:      General: She is  not in acute distress.    Appearance: Normal appearance.  HENT:     Head: Normocephalic and atraumatic.     Right Ear: External ear normal.     Left Ear: External ear normal.  Eyes:     General: No scleral icterus.       Right eye: No discharge.        Left eye: No discharge.     Conjunctiva/sclera:  Conjunctivae normal.  Neck:     Thyroid: No thyromegaly.  Cardiovascular:     Rate and Rhythm: Normal rate and regular rhythm.  Pulmonary:     Effort: No respiratory distress.     Breath sounds: Normal breath sounds. No wheezing.  Abdominal:     General: Bowel sounds are normal.     Palpations: Abdomen is soft.     Tenderness: There is no abdominal tenderness.  Genitourinary:    Comments: Normal external genitalia.  Vaginal vault without lesions.  No increased erythema.  KOH/wet prep obtained. Could not appreciate any adnexal masses or tenderness.   Musculoskeletal:        General: No swelling or tenderness.     Cervical back: Neck supple. No tenderness.  Lymphadenopathy:     Cervical: No cervical adenopathy.  Skin:    Findings: No erythema or rash.  Neurological:     Mental Status: She is alert.  Psychiatric:        Mood and Affect: Mood normal.        Behavior: Behavior normal.      Outpatient Encounter Medications as of 01/20/2022  Medication Sig   acyclovir (ZOVIRAX) 400 MG tablet TAKE 1 TABLET BY MOUTH TWICE A DAY AS NEEDED   Calcium Carbonate-Vitamin D 600-400 MG-UNIT tablet Take 1 tablet by mouth daily.   diphenhydrAMINE (BENADRYL) 12.5 MG/5ML elixir Take by mouth as needed.   famotidine (PEPCID) 40 MG tablet Take 1 tablet (40 mg total) by mouth 2 (two) times daily.   ferrous sulfate 325 (65 FE) MG tablet Take 325 mg by mouth daily.    Fluocinolone Acetonide Body 0.01 % OIL Apply to scalp before shampooing. Use for 6-10 hours with the cap each week to every other week.   fluticasone (FLONASE) 50 MCG/ACT nasal spray Place 2 sprays into both nostrils daily. To use in  place of your Nasonex   mometasone (NASONEX) 50 MCG/ACT nasal spray PLACE 2 SPRAYS INTO THE NOSE DAILY.   vitamin E 400 UNIT capsule Take 400 Units by mouth daily. Reported on 10/09/2015   Facility-Administered Encounter Medications as of 01/20/2022  Medication   0.9 %  sodium chloride infusion     Lab Results  Component Value Date   WBC 2.5 (L) 11/19/2021   HGB 14.3 11/19/2021   HCT 42.7 11/19/2021   PLT 190.0 11/19/2021   GLUCOSE 99 11/04/2021   CHOL 212 (H) 11/04/2021   TRIG 62.0 11/04/2021   HDL 81.20 11/04/2021   LDLCALC 119 (H) 11/04/2021   ALT 16 11/04/2021   AST 18 11/04/2021   NA 139 11/04/2021   K 4.8 11/04/2021   CL 103 11/04/2021   CREATININE 0.93 11/04/2021   BUN 9 11/04/2021   CO2 29 11/04/2021   TSH 0.81 07/09/2021   HGBA1C 6.1 11/04/2021    MM 3D SCREEN BREAST BILATERAL  Result Date: 10/22/2021 CLINICAL DATA:  Screening. EXAM: DIGITAL SCREENING BILATERAL MAMMOGRAM WITH TOMOSYNTHESIS AND CAD TECHNIQUE: Bilateral screening digital craniocaudal and mediolateral oblique mammograms were obtained. Bilateral screening digital breast tomosynthesis was performed. The images were evaluated with computer-aided detection. COMPARISON:  Previous exam(s). ACR Breast Density Category c: The breast tissue is heterogeneously dense, which may obscure small masses. FINDINGS: There are no findings suspicious for malignancy. IMPRESSION: No mammographic evidence of malignancy. A result letter of this screening mammogram will be mailed directly to the patient. RECOMMENDATION: Screening mammogram in one year. (Code:SM-B-01Y) BI-RADS CATEGORY  1: Negative. Electronically Signed   By: Cherlynn Kaiser  Enriqueta Shutter M.D.   On: 10/22/2021 09:31       Assessment & Plan:   Problem List Items Addressed This Visit     Dysuria    Check urine to confirm no infection.       Relevant Orders   Urinalysis, Routine w reflex microscopic (Completed)   Urine Culture (Completed)   GERD (gastroesophageal reflux  disease)    Saw GI 01/12/22 - worsening acid reflux and dysphagia.  Planning for EGD 01/28/22. Increased famotidine to '40mg'$  bid.       Vaginal irritation - Primary    Vaginal irritation as outlined.  Used monistat.  Reports symptoms have improved.  No increased inflammation on exam.  Check urine to confirm no infection.  Vaginal swab - await results.  Hold on further medication until can review results.       Relevant Orders   Cervicovaginal ancillary only( [place]) (Completed)     Einar Pheasant, MD

## 2022-01-21 ENCOUNTER — Other Ambulatory Visit: Payer: 59

## 2022-01-21 DIAGNOSIS — R300 Dysuria: Secondary | ICD-10-CM

## 2022-01-21 LAB — CERVICOVAGINAL ANCILLARY ONLY
Bacterial Vaginitis (gardnerella): NEGATIVE
Candida Glabrata: NEGATIVE
Candida Vaginitis: NEGATIVE
Comment: NEGATIVE
Comment: NEGATIVE
Comment: NEGATIVE

## 2022-01-22 LAB — URINE CULTURE
MICRO NUMBER:: 14073730
Result:: NO GROWTH
SPECIMEN QUALITY:: ADEQUATE

## 2022-01-25 ENCOUNTER — Encounter: Payer: Self-pay | Admitting: Internal Medicine

## 2022-01-25 DIAGNOSIS — N898 Other specified noninflammatory disorders of vagina: Secondary | ICD-10-CM | POA: Insufficient documentation

## 2022-01-25 NOTE — Assessment & Plan Note (Signed)
Saw GI 01/12/22 - worsening acid reflux and dysphagia.  Planning for EGD 01/28/22. Increased famotidine to '40mg'$  bid.

## 2022-01-25 NOTE — Assessment & Plan Note (Signed)
Check urine to confirm no infection.  

## 2022-01-25 NOTE — Assessment & Plan Note (Signed)
Vaginal irritation as outlined.  Used monistat.  Reports symptoms have improved.  No increased inflammation on exam.  Check urine to confirm no infection.  Vaginal swab - await results.  Hold on further medication until can review results.

## 2022-01-28 ENCOUNTER — Ambulatory Visit (AMBULATORY_SURGERY_CENTER): Payer: 59 | Admitting: Gastroenterology

## 2022-01-28 ENCOUNTER — Encounter: Payer: Self-pay | Admitting: Gastroenterology

## 2022-01-28 VITALS — BP 126/88 | HR 88 | Temp 97.8°F | Resp 18 | Ht 65.0 in | Wt 190.0 lb

## 2022-01-28 DIAGNOSIS — K449 Diaphragmatic hernia without obstruction or gangrene: Secondary | ICD-10-CM

## 2022-01-28 DIAGNOSIS — K219 Gastro-esophageal reflux disease without esophagitis: Secondary | ICD-10-CM

## 2022-01-28 DIAGNOSIS — R1310 Dysphagia, unspecified: Secondary | ICD-10-CM

## 2022-01-28 MED ORDER — SODIUM CHLORIDE 0.9 % IV SOLN: 500.0000 mL | INTRAVENOUS | Status: DC

## 2022-01-28 NOTE — Op Note (Signed)
[facility] Patient Name: Kelly Massey Procedure Date: 01/28/2022 10:11 AM MRN: [id number] Endoscopist: Remo Lipps P. Havery Kelly Massey , MD, [id number] Age: 56 Referring MD:  Date of Birth: [birth year] Gender: Female Account #: [id number] Procedure:                Upper GI endoscopy Indications:              Dysphagia - endoscopies, barium study, manometry in                            the past. She has responded to empiric dilation in                            the past, EGD with dilation for recurrent dysphagia                            symptoms Medicines:                Monitored Anesthesia Care Procedure:                Pre-Anesthesia Assessment:                           - Prior to the procedure, a History and Physical                            was performed, and patient medications and                            allergies were reviewed. The patient's tolerance of                            previous anesthesia was also reviewed. The risks                            and benefits of the procedure and the sedation                            options and risks were discussed with the patient.                            All questions were answered, and informed consent                            was obtained. Prior Anticoagulants: The patient has                            taken no anticoagulant or antiplatelet agents. ASA                            Grade Assessment: II - A patient with mild systemic                            disease. After reviewing the risks and benefits,  the patient was deemed in satisfactory condition to                            undergo the procedure.                           After obtaining informed consent, the endoscope was                            passed under direct vision. Throughout the                            procedure, the patient's blood pressure, pulse, and                            oxygen saturations were  monitored continuously. The                            Endoscope was introduced through the mouth, and                            advanced to the second part of duodenum. The upper                            GI endoscopy was accomplished without difficulty.                            The patient tolerated the procedure well. Scope In: Scope Out: Findings:                 The Z-line was regular and was found 37 cm from the                            incisors.                           A sliding 1 cm hiatal hernia was present.                           The exam of the esophagus was otherwise normal.                           A guidewire was placed and the scope was withdrawn.                            Empiric dilation was performed in the entire                            esophagus with a Savary dilator with mild                            resistance at 17 mm and then 18 mm. Relook  endoscopy showed no mucosal wrents.                           The entire examined stomach was normal.                           The ampulla appeared normal however the mucosa just                            proximal and lateral to it was slightly nodular /                            polypoid. Biopsies were taken with a cold forceps                            for histology to ensure no adenomatous change.                           The exam of the duodenum was otherwise normal. Complications:            No immediate complications. Estimated blood loss:                            Minimal. Estimated Blood Loss:     Estimated blood loss was minimal. Impression:               - Z-line regular, 37 cm from the incisors.                           - 1 cm hiatal hernia.                           - Normal esophagus otherwise - empiric dilation                            performed to 49m as above                           - Normal stomach.                           - Focal area of nodular /  polypoid mucosa near the                            ampulla. Biopsied to ensure no adenomatous change.                           - Normal duodenum otherwise Recommendation:           - Patient has a contact number available for                            emergencies. The signs and symptoms of potential                            delayed complications were  discussed with the                            patient. Return to normal activities tomorrow.                            Written discharge instructions were provided to the                            patient.                           - Advance diet as tolerated.                           - Continue present medications.                           - Await pathology results. Remo Lipps P. Kyera Felan, MD 01/28/2022 10:41:41 AM This report has been signed electronically.

## 2022-01-28 NOTE — Progress Notes (Signed)
Called to room to assist during endoscopic procedure.  Patient ID and intended procedure confirmed with present staff. Received instructions for my participation in the procedure from the performing physician.  

## 2022-01-28 NOTE — Patient Instructions (Signed)
-   Patient has a contact number available for emergencies. The signs and symptoms of potential delayed complications were discussed with the patient. Return to normal activities tomorrow. Written discharge instructions were provided to the patient. - Advance diet as tolerated. - Continue present medications. - Await pathology results.  YOU HAD AN ENDOSCOPIC PROCEDURE TODAY AT [facility]:   Refer to the procedure report that was given to you for any specific questions about what was found during the examination.  If the procedure report does not answer your questions, please call your gastroenterologist to clarify.  If you requested that your care partner not be given the details of your procedure findings, then the procedure report has been included in a sealed envelope for you to review at your convenience later.  YOU SHOULD EXPECT: Some feelings of bloating in the abdomen. Passage of more gas than usual.  Walking can help get rid of the air that was put into your GI tract during the procedure and reduce the bloating. If you had a lower endoscopy (such as a colonoscopy or flexible sigmoidoscopy) you may notice spotting of blood in your stool or on the toilet paper. If you underwent a bowel prep for your procedure, you may not have a normal bowel movement for a few days.  Please Note:  You might notice some irritation and congestion in your nose or some drainage.  This is from the oxygen used during your procedure.  There is no need for concern and it should clear up in a day or so.  SYMPTOMS TO REPORT IMMEDIATELY:  Following upper endoscopy (EGD)  Vomiting of blood or coffee ground material  New chest pain or pain under the shoulder blades  Painful or persistently difficult swallowing  New shortness of breath  Fever of 100F or higher  Black, tarry-looking stools  For urgent or emergent issues, a gastroenterologist can be reached at any hour by calling [phone]. Do not  use MyChart messaging for urgent concerns.    DIET:  We do recommend a small meal at first, but then you may proceed to your regular diet.  Drink plenty of fluids but you should avoid alcoholic beverages for 24 hours.  ACTIVITY:  You should plan to take it easy for the rest of today and you should NOT DRIVE or use heavy machinery until tomorrow (because of the sedation medicines used during the test).    FOLLOW UP: Our staff will call the number listed on your records the next business day following your procedure.  We will call around 7:15- 8:00 am to check on you and address any questions or concerns that you may have regarding the information given to you following your procedure. If we do not reach you, we will leave a message.     If any biopsies were taken you will be contacted by phone or by letter within the next 1-3 weeks.  Please call us at [phone] if you have not heard about the biopsies in 3 weeks.    SIGNATURES/CONFIDENTIALITY: You and/or your care partner have signed paperwork which will be entered into your electronic medical record.  These signatures attest to the fact that that the information above on your After Visit Summary has been reviewed and is understood.  Full responsibility of the confidentiality of this discharge information lies with you and/or your care-partner.

## 2022-01-28 NOTE — Progress Notes (Signed)
History and Physical Interval Note: Patient seen in the office on 01/12/22 - here for EGD to further evaluate / treat dysphagia. She has had longstanding dysphagia, EGDs, manometry, barium study without clear cause however dilation has reliably provided benefit to her symptoms in the past. Here for EGD with planned dilation for dysphagia. Have discussed risks / benefits and she wishes to proceed. No interval changes since office visit  01/28/2022 10:13 AM  Kelly Massey  has presented today for endoscopic procedure(s), with the diagnosis of  Encounter Diagnoses  Name Primary?   Dysphagia, unspecified type Yes   Gastroesophageal reflux disease without esophagitis   .  The various methods of evaluation and treatment have been discussed with the patient and/or family. After consideration of risks, benefits and other options for treatment, the patient has consented to  the endoscopic procedure(s).   The patient's history has been reviewed, patient examined, no change in status, stable for surgery.  I have reviewed the patient's chart and labs.  Questions were answered to the patient's satisfaction.    Jolly Mango, MD [facility]

## 2022-01-28 NOTE — Progress Notes (Signed)
To pacu, VSS. Report to [email or web address] 

## 2022-01-29 ENCOUNTER — Telehealth: Payer: Self-pay | Admitting: *Deleted

## 2022-01-29 NOTE — Telephone Encounter (Signed)
No answer on  follow up call. Left message.   

## 2022-03-02 ENCOUNTER — Ambulatory Visit (INDEPENDENT_AMBULATORY_CARE_PROVIDER_SITE_OTHER): Payer: 59 | Admitting: Internal Medicine

## 2022-03-02 ENCOUNTER — Encounter: Payer: Self-pay | Admitting: Internal Medicine

## 2022-03-02 VITALS — BP 138/84 | HR 87 | Temp 98.1°F | Resp 17 | Ht 65.0 in | Wt 194.8 lb

## 2022-03-02 DIAGNOSIS — E7800 Pure hypercholesterolemia, unspecified: Secondary | ICD-10-CM | POA: Diagnosis not present

## 2022-03-02 DIAGNOSIS — Z0000 Encounter for general adult medical examination without abnormal findings: Secondary | ICD-10-CM | POA: Diagnosis not present

## 2022-03-02 DIAGNOSIS — R030 Elevated blood-pressure reading, without diagnosis of hypertension: Secondary | ICD-10-CM

## 2022-03-02 DIAGNOSIS — R739 Hyperglycemia, unspecified: Secondary | ICD-10-CM | POA: Diagnosis not present

## 2022-03-02 DIAGNOSIS — K219 Gastro-esophageal reflux disease without esophagitis: Secondary | ICD-10-CM

## 2022-03-02 DIAGNOSIS — R0981 Nasal congestion: Secondary | ICD-10-CM

## 2022-03-02 DIAGNOSIS — J069 Acute upper respiratory infection, unspecified: Secondary | ICD-10-CM

## 2022-03-02 DIAGNOSIS — D72819 Decreased white blood cell count, unspecified: Secondary | ICD-10-CM

## 2022-03-02 DIAGNOSIS — E049 Nontoxic goiter, unspecified: Secondary | ICD-10-CM

## 2022-03-02 DIAGNOSIS — R142 Eructation: Secondary | ICD-10-CM

## 2022-03-02 DIAGNOSIS — K2270 Barrett's esophagus without dysplasia: Secondary | ICD-10-CM

## 2022-03-02 LAB — CBC WITH DIFFERENTIAL/PLATELET
Basophils Absolute: 0 10*3/uL (ref 0.0–0.1)
Basophils Relative: 1.4 % (ref 0.0–3.0)
Eosinophils Absolute: 0 10*3/uL (ref 0.0–0.7)
Eosinophils Relative: 1.6 % (ref 0.0–5.0)
HCT: 42.7 % (ref 36.0–46.0)
Hemoglobin: 14.5 g/dL (ref 12.0–15.0)
Lymphocytes Relative: 32.8 % (ref 12.0–46.0)
Lymphs Abs: 0.9 10*3/uL (ref 0.7–4.0)
MCHC: 33.8 g/dL (ref 30.0–36.0)
MCV: 92.1 fl (ref 78.0–100.0)
Monocytes Absolute: 0.5 10*3/uL (ref 0.1–1.0)
Monocytes Relative: 16.6 % — ABNORMAL HIGH (ref 3.0–12.0)
Neutro Abs: 1.4 10*3/uL (ref 1.4–7.7)
Neutrophils Relative %: 47.6 % (ref 43.0–77.0)
Platelets: 216 10*3/uL (ref 150.0–400.0)
RBC: 4.64 Mil/uL (ref 3.87–5.11)
RDW: 14.5 % (ref 11.5–15.5)
WBC: 2.9 10*3/uL — ABNORMAL LOW (ref 4.0–10.5)

## 2022-03-02 LAB — HEPATIC FUNCTION PANEL
ALT: 16 U/L (ref 0–35)
AST: 15 U/L (ref 0–37)
Albumin: 4.3 g/dL (ref 3.5–5.2)
Alkaline Phosphatase: 59 U/L (ref 39–117)
Bilirubin, Direct: 0.1 mg/dL (ref 0.0–0.3)
Total Bilirubin: 0.4 mg/dL (ref 0.2–1.2)
Total Protein: 6.8 g/dL (ref 6.0–8.3)

## 2022-03-02 LAB — BASIC METABOLIC PANEL
BUN: 7 mg/dL (ref 6–23)
CO2: 34 mEq/L — ABNORMAL HIGH (ref 19–32)
Calcium: 9.2 mg/dL (ref 8.4–10.5)
Chloride: 102 mEq/L (ref 96–112)
Creatinine, Ser: 0.89 mg/dL (ref 0.40–1.20)
GFR: 72.34 mL/min (ref >60.00)
Glucose, Bld: 86 mg/dL (ref 70–99)
Potassium: 4.5 mEq/L (ref 3.5–5.1)
Sodium: 142 mEq/L (ref 135–145)

## 2022-03-02 LAB — LIPID PANEL
Cholesterol: 216 mg/dL — ABNORMAL HIGH (ref 0–200)
HDL: 82.2 mg/dL (ref >39.00)
LDL Cholesterol: 113 mg/dL — ABNORMAL HIGH (ref 0–99)
NonHDL: 134.07
Total CHOL/HDL Ratio: 3
Triglycerides: 106 mg/dL (ref 0.0–149.0)
VLDL: 21.2 mg/dL (ref 0.0–40.0)

## 2022-03-02 LAB — HEMOGLOBIN A1C: Hgb A1c MFr Bld: 6.1 % (ref 4.6–6.5)

## 2022-03-02 LAB — TSH: TSH: 0.75 u[IU]/mL (ref 0.35–5.50)

## 2022-03-02 NOTE — Assessment & Plan Note (Addendum)
Physical postponed today. Gets breasts, pelvic and pap smears through gyn.  Mammogram 10/21/21 - Birads I.  Followed by GI.  Colonoscopy 01/05/21 (three polyps removed and diverticulosis).

## 2022-03-02 NOTE — Progress Notes (Signed)
Patient ID: KATIE FARAONE, female   DOB: October 31, 1965, 56 y.o.   MRN: [id number]   Subjective:    Patient ID: NACHELLE NEGRETTE, female    DOB: 03/03/1966, 57 y.o.   MRN: [id number]   Patient here for  Chief Complaint  Patient presents with   Sinusitis   .   HPI Here for physical exam.  Was scheduled for physical, but with acute issues, visit changed to follow up. Seeing GI for f/u dysphagia. EGD 01/28/22 - z line regular, 1cm hiatal hernia, normal esophagus. S/p dilation, normal stomach. Focal area of nodular / polypoid mucosa near the ampulla. Biopsied. Normal duodenum otherwise. Biopsies negative. Does report she was raking leaves this weekend.  Noticed 02/28/22 - headache, nasal congestion.  Has developed - runny nose.  Increased post nasal drainage.  Nno cough.  Took sinus headache and allergy medications.  Helped headache.  No chest pain or sob reported.  Eating.  States since EGD - burping is better.  No abdominal pain reported.      Past Medical History:  Diagnosis Date   Allergy    Anemia    Arthritis    knees - no meds   Colon polyps    HPP_ 2012   GERD (gastroesophageal reflux disease) 10/12/2010   EGD, positive H. pylori   HSV infection    History   Post-operative nausea and vomiting    Seasonal allergies    Past Surgical History:  Procedure Laterality Date   33 HOUR [place] STUDY N/A 07/25/2019   Procedure: 24 HOUR [place] STUDY;  Surgeon: Lavena Bullion, DO;  Location: [facility];  Service: Gastroenterology;  Laterality: N/A;   ABDOMINAL HYSTERECTOMY     BALLOON DILATION N/A 07/24/2012   Procedure: BALLOON DILATION;  Surgeon: Inda Castle, MD;  Location: [facility];  Service: Endoscopy;  Laterality: N/A;   BRAVO [place] STUDY N/A 07/24/2012   Procedure: BRAVO [place] STUDY;  Surgeon: Inda Castle, MD;  Location: [facility];  Service: Endoscopy;  Laterality: N/A;   COLONOSCOPY     DILATION AND CURETTAGE OF UTERUS     SAB   ESOPHAGEAL MANOMETRY N/A 07/25/2019    Procedure: ESOPHAGEAL MANOMETRY (EM);  Surgeon: Lavena Bullion, DO;  Location: [facility];  Service: Gastroenterology;  Laterality: N/A;   ESOPHAGOGASTRODUODENOSCOPY N/A 07/24/2012   Procedure: ESOPHAGOGASTRODUODENOSCOPY (EGD);  Surgeon: Inda Castle, MD;  Location: [facility];  Service: Endoscopy;  Laterality: N/A;   OVARIAN CYST REMOVAL  2004   laparotomy -left   ROBOTIC ASSISTED LAPAROSCOPIC LYSIS OF ADHESION N/A 03/21/2014   Procedure: ROBOTIC ASSISTED LAPAROSCOPIC EXTENSIVE LYSIS OF ADHESION (1 Hour);  Surgeon: Marvene Staff, MD;  Location: [facility];  Service: Gynecology;  Laterality: N/A;   ROBOTIC ASSISTED SALPINGO OOPHERECTOMY Left 03/21/2014   Procedure:  ROBOTIC ASSISTED LEFT OOPHORECTOMY;  Surgeon: Marvene Staff, MD;  Location: [facility];  Service: Gynecology;  Laterality: Left;   rotator cuff surgery  2017   SHOULDER ARTHROSCOPY Left    TENNIS ELBOW RELEASE/NIRSCHEL PROCEDURE Right 05/01/2015   Procedure: RIGHT ELBOW DEBRIDEMENT AND TENDON REPAIR;  Surgeon: [name], MD;  Location: [facility];  Service: Orthopedics;  Laterality: Right;   UPPER GASTROINTESTINAL ENDOSCOPY     WISDOM TOOTH EXTRACTION     Family History  Problem Relation Age of Onset   Arthritis Mother    Stroke Mother    Hypertension Mother    Heart failure Mother    Dementia Mother  Lung cancer Father    Diabetes Sister    Diabetes Brother        x 2   Breast cancer Maternal Aunt    Throat cancer Maternal Aunt        Smoker   Esophageal cancer Maternal Aunt    Prostate cancer Maternal Uncle        x 2   Diabetes Maternal Uncle    Diabetes Paternal Grandmother    Diabetes Paternal Grandfather    Hypertension Other    Hyperlipidemia Other    Colon cancer Neg Hx    Stomach cancer Neg Hx    Rectal cancer Neg Hx    Colon polyps Neg Hx    Social History   Socioeconomic History   Marital status: Married    Spouse name: Not on file   Number of children: 0    Years of education: Not on file   Highest education level: Not on file  Occupational History   Occupation: RESEARCH    Employer: [employer]  Tobacco Use   Smoking status: Never   Smokeless tobacco: Never  Vaping Use   Vaping Use: Never used  Substance and Sexual Activity   Alcohol use: Yes    Alcohol/week: 0.0 standard drinks of alcohol    Comment: occassional   Drug use: No   Sexual activity: Yes    Birth control/protection: None, Surgical    Comment: hysterectomy  Other Topics Concern   Not on file  Social History Narrative   Transport planner    Social Determinants of Health   Financial Resource Strain: Not on file  Food Insecurity: Not on file  Transportation Needs: Not on file  Physical Activity: Not on file  Stress: Not on file  Social Connections: Not on file     Review of Systems  Constitutional:  Negative for appetite change and fever.  HENT:  Positive for postnasal drip.        Runny nose, nasal congestion.   Respiratory:  Negative for cough, chest tightness and shortness of breath.   Cardiovascular:  Negative for chest pain, palpitations and leg swelling.  Gastrointestinal:  Negative for abdominal pain, diarrhea, nausea and vomiting.  Genitourinary:  Negative for difficulty urinating and dysuria.  Musculoskeletal:  Negative for joint swelling and myalgias.  Skin:  Negative for color change and rash.  Neurological:  Negative for dizziness and headaches.  Psychiatric/Behavioral:  Negative for agitation and dysphoric mood.        Objective:     BP 138/84   Pulse 87   Temp 98.1 F (36.7 C) (Temporal)   Resp 17   Ht _0  (1.651 m)   Wt 194 lb 12.8 oz (88.4 kg)   LMP 10/06/2011   SpO2 98%   BMI 32.42 kg/m  Wt Readings from Last 3 Encounters:  03/02/22 194 lb 12.8 oz (88.4 kg)  01/28/22 190 lb (86.2 kg)  01/20/22 192 lb 6.4 oz (87.3 kg)    Physical Exam Vitals reviewed.  Constitutional:      General: She is not in acute distress.     Appearance: Normal appearance.  HENT:     Head: Normocephalic and atraumatic.     Right Ear: Tympanic membrane and external ear normal.     Left Ear: Tympanic membrane and external ear normal.     Mouth/Throat:     Pharynx: No oropharyngeal exudate or posterior oropharyngeal erythema.  Eyes:     General: No scleral icterus.  Right eye: No discharge.        Left eye: No discharge.     Conjunctiva/sclera: Conjunctivae normal.  Neck:     Thyroid: No thyromegaly.  Cardiovascular:     Rate and Rhythm: Normal rate and regular rhythm.  Pulmonary:     Effort: No respiratory distress.     Breath sounds: Normal breath sounds. No wheezing.  Abdominal:     General: Bowel sounds are normal.     Palpations: Abdomen is soft.     Tenderness: There is no abdominal tenderness.  Musculoskeletal:        General: No swelling or tenderness.     Cervical back: Neck supple. No tenderness.  Lymphadenopathy:     Cervical: No cervical adenopathy.  Skin:    Findings: No erythema or rash.  Neurological:     Mental Status: She is alert.  Psychiatric:        Mood and Affect: Mood normal.        Behavior: Behavior normal.      Outpatient Encounter Medications as of 03/02/2022  Medication Sig   acyclovir (ZOVIRAX) 400 MG tablet TAKE 1 TABLET BY MOUTH TWICE A DAY AS NEEDED   azithromycin (ZITHROMAX) 250 MG tablet Take 2 tablets on day 1, then 1 tablet daily on days 2 through 5   Calcium Carbonate-Vitamin D 600-400 MG-UNIT tablet Take 1 tablet by mouth daily.   diphenhydrAMINE (BENADRYL) 12.5 MG/5ML elixir Take by mouth as needed.   famotidine (PEPCID) 40 MG tablet Take 1 tablet (40 mg total) by mouth 2 (two) times daily.   ferrous sulfate 325 (65 FE) MG tablet Take 325 mg by mouth daily.    Fluocinolone Acetonide Body 0.01 % OIL Apply to scalp before shampooing. Use for 6-10 hours with the cap each week to every other week.   fluticasone (FLONASE) 50 MCG/ACT nasal spray Place 2 sprays into both  nostrils daily. To use in place of your Nasonex   mometasone (NASONEX) 50 MCG/ACT nasal spray PLACE 2 SPRAYS INTO THE NOSE DAILY.   vitamin E 400 UNIT capsule Take 400 Units by mouth daily. Reported on 10/09/2015   No facility-administered encounter medications on file as of 03/02/2022.     Lab Results  Component Value Date   WBC 2.9 (L) 03/02/2022   HGB 14.5 03/02/2022   HCT 42.7 03/02/2022   PLT 216.0 03/02/2022   GLUCOSE 86 03/02/2022   CHOL 216 (H) 03/02/2022   TRIG 106.0 03/02/2022   HDL 82.20 03/02/2022   LDLCALC 113 (H) 03/02/2022   ALT 16 03/02/2022   AST 15 03/02/2022   NA 142 03/02/2022   K 4.5 03/02/2022   CL 102 03/02/2022   CREATININE 0.89 03/02/2022   BUN 7 03/02/2022   CO2 34 (H) 03/02/2022   TSH 0.75 03/02/2022   HGBA1C 6.1 03/02/2022       Assessment & Plan:   Problem List Items Addressed This Visit     Barrett's esophagus     Seeing GI for f/u dysphagia. EGD 01/28/22 - z line regular, 1cm hiatal hernia, normal esophagus. S/p dilation, normal stomach. Focal area of nodular / polypoid mucosa near the ampulla. Biopsied. Normal duodenum otherwise.      Belching    Improved after EGD.       Elevated blood pressure reading    Elevated today.  Spot check pressure.  Get her back in soon to reassess.       Enlarged thyroid    Previous  ultrasound - no nodules.  Follow tsh.       Relevant Orders   TSH (Completed)   GERD (gastroesophageal reflux disease)    Saw GI 01/12/22 - worsening acid reflux and dysphagia.   Seeing GI for f/u dysphagia. EGD 01/28/22 - z line regular, 1cm hiatal hernia, normal esophagus. S/p dilation, normal stomach. Focal area of nodular / polypoid mucosa near the ampulla. Biopsied. Normal duodenum otherwise. Continue pepcid.  Belching better.       Health care maintenance - Primary    Physical postponed today. Gets breasts, pelvic and pap smears through gyn.  Mammogram 10/21/21 - Birads I.  Followed by GI.  Colonoscopy 01/05/21 (three  polyps removed and diverticulosis).        Hypercholesterolemia    The 10-year ASCVD risk score (Arnett DK, et al., 2019) is: 3.8%   Values used to calculate the score:     Age: 29 years     Sex: Female     Is Non-Hispanic African American: Yes     Diabetic: No     Tobacco smoker: No     Systolic Blood Pressure: 161 mmHg     Is BP treated: No     HDL Cholesterol: 82.2 mg/dL     Total Cholesterol: 216 mg/dL  Low cholesterol diet and exercise.  Follow lipid panel.        Relevant Orders   Hepatic function panel (Completed)   Lipid panel (Completed)   Basic metabolic panel (Completed)   Hyperglycemia    Low carb diet and exercise.  Follow met b and a1c.       Relevant Orders   Hemoglobin A1c (Completed)   Leukopenia    Has been worked up by hematology.  Felt related to PPI.  Follow cbc.  Recheck with net labs.       Relevant Orders   CBC with Differential/Platelet (Completed)   URI (upper respiratory infection)    Increased nasal congestion and drainage.  Out raking leaves this weekend.  Treat allergies.  Steroid nasal spray and saline nasal spray as directed.  Robitussin DM.  Follow.  Call with update.       Relevant Medications   azithromycin (ZITHROMAX) 250 MG tablet   Other Visit Diagnoses     Head congestion       Relevant Orders   COVID-19, Flu A+B and RSV (Completed)        Einar Pheasant, MD

## 2022-03-03 ENCOUNTER — Telehealth: Payer: Self-pay | Admitting: Internal Medicine

## 2022-03-03 MED ORDER — AZITHROMYCIN 250 MG PO TABS: ORAL_TABLET | ORAL | 0 refills | Status: AC

## 2022-03-03 NOTE — Telephone Encounter (Signed)
Spoke to Kelly Massey.  Increased congestion.  Request abx.  Has taken zpak and tolerated.  Rx for zpak sent in to [facility]

## 2022-03-03 NOTE — Telephone Encounter (Signed)
Patient called and was seen yesterday by Dr Nicki Reaper. She thought she was feeling better yesterday, but woke with a temp of 100, and pressure in head. She needs a antibiotic.

## 2022-03-04 ENCOUNTER — Telehealth: Payer: Self-pay

## 2022-03-04 LAB — COVID-19, FLU A+B AND RSV
Influenza A, NAA: NOT DETECTED
Influenza B, NAA: NOT DETECTED
RSV, NAA: NOT DETECTED
SARS-CoV-2, NAA: NOT DETECTED

## 2022-03-04 NOTE — Telephone Encounter (Signed)
Spoke w/ pt - does not feel anything is getting worse. Stated sinus pressure better now, not as full/tight.  Picked up abx last night, took first dose. Will continue taking, as well as mucinex and nasal sprays.  Pt advised that if she does not continue to improve, to call and let me know. Will need eval

## 2022-03-04 NOTE — Telephone Encounter (Signed)
Just called in abx yesterday.  Continue nasal sprays, mucinex abx, etc.   If she feels symptoms are worsening or concern - will need to be reevaluated.

## 2022-03-04 NOTE — Telephone Encounter (Signed)
Lvm for pt to return call in regards to nasal swab results.  Per Dr.Scott: Please call and notify Kelly Massey that her nasal swab was negative.  I spoke to her yesterday and sent in an abx.  Please confirm she is doing ok.  Keep [place] posted on how doing.

## 2022-03-04 NOTE — Telephone Encounter (Signed)
Patient states she is returning our call.  I read Dr. Randell Patient Scott's message to patient.  Patient states she started coughing up greenish/brown stuff this morning.  Patient states she has sinus pressure in her head.

## 2022-03-07 ENCOUNTER — Encounter: Payer: Self-pay | Admitting: Internal Medicine

## 2022-03-07 DIAGNOSIS — R030 Elevated blood-pressure reading, without diagnosis of hypertension: Secondary | ICD-10-CM | POA: Insufficient documentation

## 2022-03-07 NOTE — Assessment & Plan Note (Signed)
Improved after EGD.

## 2022-03-07 NOTE — Assessment & Plan Note (Signed)
Increased nasal congestion and drainage.  Out raking leaves this weekend.  Treat allergies.  Steroid nasal spray and saline nasal spray as directed.  Robitussin DM.  Follow.  Call with update.

## 2022-03-07 NOTE — Assessment & Plan Note (Signed)
Seeing GI for f/u dysphagia. EGD 01/28/22 - z line regular, 1cm hiatal hernia, normal esophagus. S/p dilation, normal stomach. Focal area of nodular / polypoid mucosa near the ampulla. Biopsied. Normal duodenum otherwise.

## 2022-03-07 NOTE — Assessment & Plan Note (Signed)
Has been worked up by hematology.  Felt related to PPI.  Follow cbc.  Recheck with net labs.

## 2022-03-07 NOTE — Assessment & Plan Note (Signed)
Low carb diet and exercise.  Follow met b and a1c.  

## 2022-03-07 NOTE — Assessment & Plan Note (Signed)
Saw GI 01/12/22 - worsening acid reflux and dysphagia.   Seeing GI for f/u dysphagia. EGD 01/28/22 - z line regular, 1cm hiatal hernia, normal esophagus. S/p dilation, normal stomach. Focal area of nodular / polypoid mucosa near the ampulla. Biopsied. Normal duodenum otherwise. Continue pepcid.  Belching better.

## 2022-03-07 NOTE — Assessment & Plan Note (Signed)
The 10-year ASCVD risk score (Arnett DK, et al., 2019) is: 3.8%   Values used to calculate the score:     Age: 56 years     Sex: Female     Is Non-Hispanic African American: Yes     Diabetic: No     Tobacco smoker: No     Systolic Blood Pressure: 211 mmHg     Is BP treated: No     HDL Cholesterol: 82.2 mg/dL     Total Cholesterol: 216 mg/dL  Low cholesterol diet and exercise.  Follow lipid panel.

## 2022-03-07 NOTE — Assessment & Plan Note (Signed)
Elevated today.  Spot check pressure.  Get her back in soon to reassess.

## 2022-03-07 NOTE — Assessment & Plan Note (Signed)
Previous ultrasound - no nodules.  Follow tsh.

## 2022-03-18 ENCOUNTER — Other Ambulatory Visit: Payer: Self-pay | Admitting: Family

## 2022-03-22 ENCOUNTER — Encounter: Payer: Self-pay | Admitting: Internal Medicine

## 2022-03-22 DIAGNOSIS — Z1231 Encounter for screening mammogram for malignant neoplasm of breast: Secondary | ICD-10-CM

## 2022-03-24 ENCOUNTER — Ambulatory Visit: Payer: 59 | Admitting: Family Medicine

## 2022-03-26 ENCOUNTER — Ambulatory Visit: Payer: 59 | Admitting: Internal Medicine

## 2022-03-26 ENCOUNTER — Encounter: Payer: Self-pay | Admitting: Internal Medicine

## 2022-03-26 VITALS — BP 130/84 | HR 64 | Temp 97.8°F | Resp 15 | Ht 65.0 in | Wt 191.6 lb

## 2022-03-26 DIAGNOSIS — K2270 Barrett's esophagus without dysplasia: Secondary | ICD-10-CM | POA: Diagnosis not present

## 2022-03-26 DIAGNOSIS — Z0000 Encounter for general adult medical examination without abnormal findings: Secondary | ICD-10-CM | POA: Diagnosis not present

## 2022-03-26 DIAGNOSIS — K219 Gastro-esophageal reflux disease without esophagitis: Secondary | ICD-10-CM

## 2022-03-26 DIAGNOSIS — E7800 Pure hypercholesterolemia, unspecified: Secondary | ICD-10-CM

## 2022-03-26 DIAGNOSIS — R739 Hyperglycemia, unspecified: Secondary | ICD-10-CM | POA: Diagnosis not present

## 2022-03-26 DIAGNOSIS — E049 Nontoxic goiter, unspecified: Secondary | ICD-10-CM

## 2022-03-26 DIAGNOSIS — N898 Other specified noninflammatory disorders of vagina: Secondary | ICD-10-CM

## 2022-03-26 DIAGNOSIS — Z86010 Personal history of colonic polyps: Secondary | ICD-10-CM

## 2022-03-26 DIAGNOSIS — R21 Rash and other nonspecific skin eruption: Secondary | ICD-10-CM

## 2022-03-26 DIAGNOSIS — D72819 Decreased white blood cell count, unspecified: Secondary | ICD-10-CM

## 2022-03-26 MED ORDER — FLUCONAZOLE 150 MG PO TABS: ORAL_TABLET | ORAL | 0 refills | Status: DC

## 2022-03-26 MED ORDER — NYSTATIN 100000 UNIT/GM EX CREA: 1.0000 | TOPICAL_CREAM | Freq: Two times a day (BID) | CUTANEOUS | 0 refills | Status: DC

## 2022-03-26 NOTE — Progress Notes (Unsigned)
Subjective:    Patient ID: Kelly Massey, female    DOB: May 31, 1965, 56 y.o.   MRN: [id number]  Patient here for  Chief Complaint  Patient presents with   Annual Exam    CPE    HPI Reports she is doing relatively well.  She has noticed a rash beneath her left breast.  Also noticed flare - vaginal itching - started early this week.  Some previous white discharge.  No abdominal pain or urine change reported.  No chest pain or sob reported.  No increased cough or congestion.  Did request referral to [facility] - to have rash evaluated.     Past Medical History:  Diagnosis Date   Allergy    Anemia    Arthritis    knees - no meds   Colon polyps    HPP_ 2012   GERD (gastroesophageal reflux disease) 10/12/2010   EGD, positive H. pylori   HSV infection    History   Post-operative nausea and vomiting    Seasonal allergies    Past Surgical History:  Procedure Laterality Date   47 HOUR [place] STUDY N/A 07/25/2019   Procedure: 24 HOUR [place] STUDY;  Surgeon: Lavena Bullion, DO;  Location: [facility];  Service: Gastroenterology;  Laterality: N/A;   ABDOMINAL HYSTERECTOMY     BALLOON DILATION N/A 07/24/2012   Procedure: BALLOON DILATION;  Surgeon: Inda Castle, MD;  Location: [facility];  Service: Endoscopy;  Laterality: N/A;   BRAVO [place] STUDY N/A 07/24/2012   Procedure: BRAVO [place] STUDY;  Surgeon: Inda Castle, MD;  Location: [facility];  Service: Endoscopy;  Laterality: N/A;   COLONOSCOPY     DILATION AND CURETTAGE OF UTERUS     SAB   ESOPHAGEAL MANOMETRY N/A 07/25/2019   Procedure: ESOPHAGEAL MANOMETRY (EM);  Surgeon: Lavena Bullion, DO;  Location: [facility];  Service: Gastroenterology;  Laterality: N/A;   ESOPHAGOGASTRODUODENOSCOPY N/A 07/24/2012   Procedure: ESOPHAGOGASTRODUODENOSCOPY (EGD);  Surgeon: Inda Castle, MD;  Location: [facility];  Service: Endoscopy;  Laterality: N/A;   OVARIAN CYST REMOVAL  2004   laparotomy -left   ROBOTIC ASSISTED  LAPAROSCOPIC LYSIS OF ADHESION N/A 03/21/2014   Procedure: ROBOTIC ASSISTED LAPAROSCOPIC EXTENSIVE LYSIS OF ADHESION (1 Hour);  Surgeon: Marvene Staff, MD;  Location: [facility];  Service: Gynecology;  Laterality: N/A;   ROBOTIC ASSISTED SALPINGO OOPHERECTOMY Left 03/21/2014   Procedure:  ROBOTIC ASSISTED LEFT OOPHORECTOMY;  Surgeon: Marvene Staff, MD;  Location: [facility];  Service: Gynecology;  Laterality: Left;   rotator cuff surgery  2017   SHOULDER ARTHROSCOPY Left    TENNIS ELBOW RELEASE/NIRSCHEL PROCEDURE Right 05/01/2015   Procedure: RIGHT ELBOW DEBRIDEMENT AND TENDON REPAIR;  Surgeon: [name], MD;  Location: [facility];  Service: Orthopedics;  Laterality: Right;   UPPER GASTROINTESTINAL ENDOSCOPY     WISDOM TOOTH EXTRACTION     Family History  Problem Relation Age of Onset   Arthritis Mother    Stroke Mother    Hypertension Mother    Heart failure Mother    Dementia Mother    Lung cancer Father    Diabetes Sister    Diabetes Brother        x 2   Breast cancer Maternal Aunt    Throat cancer Maternal Aunt        Smoker   Esophageal cancer Maternal Aunt    Prostate cancer Maternal Uncle        x  2   Diabetes Maternal Uncle    Diabetes Paternal Grandmother    Diabetes Paternal Grandfather    Hypertension Other    Hyperlipidemia Other    Colon cancer Neg Hx    Stomach cancer Neg Hx    Rectal cancer Neg Hx    Colon polyps Neg Hx    Social History   Socioeconomic History   Marital status: Married    Spouse name: Not on file   Number of children: 0   Years of education: Not on file   Highest education level: Not on file  Occupational History   Occupation: RESEARCH    Employer: [employer]  Tobacco Use   Smoking status: Never   Smokeless tobacco: Never  Vaping Use   Vaping Use: Never used  Substance and Sexual Activity   Alcohol use: Yes    Alcohol/week: 0.0 standard drinks of alcohol    Comment: occassional   Drug use: No    Sexual activity: Yes    Birth control/protection: None, Surgical    Comment: hysterectomy  Other Topics Concern   Not on file  Social History Narrative   Transport planner    Social Determinants of Health   Financial Resource Strain: Not on file  Food Insecurity: Not on file  Transportation Needs: Not on file  Physical Activity: Not on file  Stress: Not on file  Social Connections: Not on file     Review of Systems  Constitutional:  Negative for appetite change and unexpected weight change.  HENT:  Negative for congestion, sinus pressure and sore throat.   Eyes:  Negative for pain and visual disturbance.  Respiratory:  Negative for cough, chest tightness and shortness of breath.   Cardiovascular:  Negative for chest pain, palpitations and leg swelling.  Gastrointestinal:  Negative for abdominal pain, diarrhea, nausea and vomiting.  Genitourinary:  Negative for difficulty urinating and dysuria.  Musculoskeletal:  Negative for joint swelling and myalgias.  Skin:  Positive for rash. Negative for color change.  Neurological:  Negative for dizziness and headaches.  Hematological:  Negative for adenopathy. Does not bruise/bleed easily.  Psychiatric/Behavioral:  Negative for agitation and dysphoric mood.        Objective:     BP 130/84 (BP Location: Left Arm, Patient Position: Sitting, Cuff Size: Large)   Pulse 64   Temp 97.8 F (36.6 C) (Temporal)   Resp 15   Ht _0  (1.651 m)   Wt 191 lb 9.6 oz (86.9 kg)   LMP 10/06/2011   SpO2 98%   BMI 31.88 kg/m  Wt Readings from Last 3 Encounters:  03/26/22 191 lb 9.6 oz (86.9 kg)  03/02/22 194 lb 12.8 oz (88.4 kg)  01/28/22 190 lb (86.2 kg)    Physical Exam Vitals reviewed.  Constitutional:      General: She is not in acute distress.    Appearance: Normal appearance. She is well-developed.  HENT:     Head: Normocephalic and atraumatic.     Right Ear: External ear normal.     Left Ear: External ear normal.  Eyes:      General: No scleral icterus.       Right eye: No discharge.        Left eye: No discharge.     Conjunctiva/sclera: Conjunctivae normal.  Neck:     Thyroid: No thyromegaly.  Cardiovascular:     Rate and Rhythm: Normal rate and regular rhythm.  Pulmonary:     Effort: No tachypnea, accessory  muscle usage or respiratory distress.     Breath sounds: Normal breath sounds. No decreased breath sounds, wheezing or rhonchi.  Chest:  Breasts:    Right: No inverted nipple, mass, nipple discharge or tenderness (no axillary adenopathy).     Left: No inverted nipple, mass, nipple discharge or tenderness (no axilarry adenopathy).  Abdominal:     General: Bowel sounds are normal.     Palpations: Abdomen is soft.     Tenderness: There is no abdominal tenderness.  Genitourinary:    Comments: Sees gyn. Discussed vaginal exam - declines at this time.  Musculoskeletal:        General: No swelling or tenderness.     Cervical back: Neck supple.  Lymphadenopathy:     Cervical: No cervical adenopathy.  Skin:    Comments: Rash - under left breast.  Appears to be c/w fungal infection.    Neurological:     Mental Status: She is alert and oriented to person, place, and time.  Psychiatric:        Mood and Affect: Mood normal.        Behavior: Behavior normal.      Outpatient Encounter Medications as of 03/26/2022  Medication Sig   Calcium Carbonate-Vitamin D 600-400 MG-UNIT tablet Take 1 tablet by mouth daily.   diphenhydrAMINE (BENADRYL) 12.5 MG/5ML elixir Take by mouth as needed.   famotidine (PEPCID) 40 MG tablet Take 1 tablet (40 mg total) by mouth 2 (two) times daily.   ferrous sulfate 325 (65 FE) MG tablet Take 325 mg by mouth daily.    fluconazole (DIFLUCAN) 150 MG tablet Take one tablet x 1 and if persistent symptoms, may repeat x 1   Fluocinolone Acetonide Body 0.01 % OIL Apply to scalp before shampooing. Use for 6-10 hours with the cap each week to every other week.   fluticasone (FLONASE) 50  MCG/ACT nasal spray Place 2 sprays into both nostrils daily. To use in place of your Nasonex   mometasone (NASONEX) 50 MCG/ACT nasal spray PLACE 2 SPRAYS INTO THE NOSE DAILY.   nystatin cream (MYCOSTATIN) Apply 1 Application topically 2 (two) times daily. Apply to affected area under left breast bid   vitamin E 400 UNIT capsule Take 400 Units by mouth daily. Reported on 10/09/2015   [DISCONTINUED] acyclovir (ZOVIRAX) 400 MG tablet TAKE 1 TABLET BY MOUTH TWICE A DAY AS NEEDED   acyclovir (ZOVIRAX) 400 MG tablet Take 1 tablet (400 mg total) by mouth 2 (two) times daily as needed.   No facility-administered encounter medications on file as of 03/26/2022.     Lab Results  Component Value Date   WBC 2.9 (L) 03/02/2022   HGB 14.5 03/02/2022   HCT 42.7 03/02/2022   PLT 216.0 03/02/2022   GLUCOSE 86 03/02/2022   CHOL 216 (H) 03/02/2022   TRIG 106.0 03/02/2022   HDL 82.20 03/02/2022   LDLCALC 113 (H) 03/02/2022   ALT 16 03/02/2022   AST 15 03/02/2022   NA 142 03/02/2022   K 4.5 03/02/2022   CL 102 03/02/2022   CREATININE 0.89 03/02/2022   BUN 7 03/02/2022   CO2 34 (H) 03/02/2022   TSH 0.75 03/02/2022   HGBA1C 6.1 03/02/2022    No results found.     Assessment & Plan:  Routine general medical examination at a health care facility  Hypercholesterolemia Assessment & Plan: The 10-year ASCVD risk score (Arnett DK, et al., 2019) is: 2.8%   Values used to calculate the score:  Age: 43 years     Sex: Female     Is Non-Hispanic African American: Yes     Diabetic: No     Tobacco smoker: No     Systolic Blood Pressure: 287 mmHg     Is BP treated: No     HDL Cholesterol: 82.2 mg/dL     Total Cholesterol: 216 mg/dL  Low cholesterol diet and exercise.  Follow lipid panel.    Orders: -     Basic metabolic panel; Future -     Lipid panel; Future -     Hepatic function panel; Future  Hyperglycemia Assessment & Plan: Low carb diet and exercise.  Follow met b and a1c.   Orders: -      Hemoglobin A1c; Future  Health care maintenance Assessment & Plan: Physical today 03/26/22. Gets breasts, pelvic and pap smears through gyn.  Breat exam today. Mammogram 10/21/21 - Birads I.  Followed by GI.  Colonoscopy 01/05/21 (three polyps removed and diverticulosis).     Barrett's esophagus without dysplasia Assessment & Plan:  Seeing GI for f/u dysphagia. EGD 01/28/22 - z line regular, 1cm hiatal hernia, normal esophagus. S/p dilation, normal stomach. Focal area of nodular / polypoid mucosa near the ampulla. Biopsied. Normal duodenum otherwise. Appears to be doing  better regarding upper GI symptoms.    Enlarged thyroid Assessment & Plan: Previous ultrasound - no nodules.  Follow tsh.    Gastroesophageal reflux disease without esophagitis Assessment & Plan: Continue pepcid.  Followed by GI.    History of colonic polyps Assessment & Plan: Colonoscopy 01/25/21 - Tortuous colon. One 3 mm polyp in the ascending colon, removed with a cold snare. Two 3 mm polyps at the recto-sigmoid colon, removed with a cold snare. Diverticulosis - sigmoid.    Leukopenia, unspecified type Assessment & Plan: Has been worked up by hematology.  Felt related to PPI.  Follow cbc.  Recheck with net labs.   Orders: -     CBC with Differential/Platelet; Future  Vaginal irritation Assessment & Plan: Symptoms as outlined.  Diflucan as directed.  Follow.  Call with update.    Rash Assessment & Plan: Beneath left breast.  Question of fungal infection.  Vaginal symptoms being treated with diflucan.  Nystatin cream.  Follow.  Request referral to [facility].    Orders: -     Ambulatory referral to Dermatology  Other orders -     Fluconazole; Take one tablet x 1 and if persistent symptoms, may repeat x 1  Dispense: 2 tablet; Refill: 0 -     Nystatin; Apply 1 Application topically 2 (two) times daily. Apply to affected area under left breast bid  Dispense: 30 g; Refill: 0 -     Acyclovir;  Take 1 tablet (400 mg total) by mouth 2 (two) times daily as needed.  Dispense: 60 tablet; Refill: 1     Einar Pheasant, MD

## 2022-03-29 ENCOUNTER — Encounter: Payer: Self-pay | Admitting: Internal Medicine

## 2022-03-29 DIAGNOSIS — R21 Rash and other nonspecific skin eruption: Secondary | ICD-10-CM | POA: Insufficient documentation

## 2022-03-29 MED ORDER — ACYCLOVIR 400 MG PO TABS: 400.0000 mg | ORAL_TABLET | Freq: Two times a day (BID) | ORAL | 1 refills | Status: DC | PRN

## 2022-03-29 NOTE — Assessment & Plan Note (Signed)
Previous ultrasound - no nodules.  Follow tsh.

## 2022-03-29 NOTE — Assessment & Plan Note (Signed)
Has been worked up by hematology.  Felt related to PPI.  Follow cbc.  Recheck with net labs.

## 2022-03-29 NOTE — Assessment & Plan Note (Signed)
Beneath left breast.  Question of fungal infection.  Vaginal symptoms being treated with diflucan.  Nystatin cream.  Follow.  Request referral to [facility].

## 2022-03-29 NOTE — Assessment & Plan Note (Signed)
Continue pepcid.  Followed by GI.

## 2022-03-29 NOTE — Assessment & Plan Note (Signed)
The 10-year ASCVD risk score (Arnett DK, et al., 2019) is: 2.8%   Values used to calculate the score:     Age: 56 years     Sex: Female     Is Non-Hispanic African American: Yes     Diabetic: No     Tobacco smoker: No     Systolic Blood Pressure: 403 mmHg     Is BP treated: No     HDL Cholesterol: 82.2 mg/dL     Total Cholesterol: 216 mg/dL  Low cholesterol diet and exercise.  Follow lipid panel.

## 2022-03-29 NOTE — Assessment & Plan Note (Signed)
Low carb diet and exercise.  Follow met b and a1c.  

## 2022-03-29 NOTE — Assessment & Plan Note (Signed)
Seeing GI for f/u dysphagia. EGD 01/28/22 - z line regular, 1cm hiatal hernia, normal esophagus. S/p dilation, normal stomach. Focal area of nodular / polypoid mucosa near the ampulla. Biopsied. Normal duodenum otherwise. Appears to be doing  better regarding upper GI symptoms.

## 2022-03-29 NOTE — Assessment & Plan Note (Signed)
Colonoscopy 01/25/21 - Tortuous colon. One 3 mm polyp in the ascending colon, removed with a cold snare. Two 3 mm polyps at the recto-sigmoid colon, removed with a cold snare. Diverticulosis - sigmoid.

## 2022-03-29 NOTE — Assessment & Plan Note (Signed)
Physical today 03/26/22. Gets breasts, pelvic and pap smears through gyn.  Breat exam today. Mammogram 10/21/21 - Birads I.  Followed by GI.  Colonoscopy 01/05/21 (three polyps removed and diverticulosis).

## 2022-03-29 NOTE — Assessment & Plan Note (Signed)
Symptoms as outlined.  Diflucan as directed.  Follow.  Call with update.

## 2022-05-14 ENCOUNTER — Other Ambulatory Visit: Payer: Self-pay | Admitting: Nurse Practitioner

## 2022-05-26 ENCOUNTER — Ambulatory Visit (INDEPENDENT_AMBULATORY_CARE_PROVIDER_SITE_OTHER): Payer: 59

## 2022-05-26 ENCOUNTER — Ambulatory Visit
Admission: RE | Admit: 2022-05-26 | Discharge: 2022-05-26 | Disposition: A | Discharge status: Home / Self Care | Payer: 59 | Source: Ambulatory Visit | Attending: Emergency Medicine | Admitting: Emergency Medicine

## 2022-05-26 VITALS — BP 147/97 | HR 84 | Temp 98.6°F | Resp 16

## 2022-05-26 DIAGNOSIS — J069 Acute upper respiratory infection, unspecified: Secondary | ICD-10-CM

## 2022-05-26 MED ORDER — PROMETHAZINE-DM 6.25-15 MG/5ML PO SYRP: 5.0000 mL | ORAL_SOLUTION | Freq: Four times a day (QID) | ORAL | 0 refills | Status: DC | PRN

## 2022-05-26 MED ORDER — IPRATROPIUM BROMIDE 0.06 % NA SOLN: 2.0000 | Freq: Four times a day (QID) | NASAL | 12 refills | Status: DC

## 2022-05-26 MED ORDER — BENZONATATE 100 MG PO CAPS: 200.0000 mg | ORAL_CAPSULE | Freq: Three times a day (TID) | ORAL | 0 refills | Status: DC

## 2022-05-26 NOTE — ED Provider Notes (Signed)
[facility]    CSN: [id number] Arrival date & time: 05/26/22  1545      History   Chief Complaint Chief Complaint  Patient presents with   Nasal Congestion    My head is stopped up, and my nose keeps running.  I have a sore throat.  I just feel tired. - Entered by patient   Fatigue   Sore Throat    HPI Kelly Massey is a 57 y.o. female.   HPI  57 year old female here for evaluation respiratory complaints.  The patient reports that for the last 3 days she has been experiencing nasal congestion with clear nasal discharge, fatigue, productive cough for green sputum, sore throat, and sneezing.  She denies any shortness of breath or wheezing.  She also denies GI symptoms.  Past Medical History:  Diagnosis Date   Allergy    Anemia    Arthritis    knees - no meds   Colon polyps    HPP_ 2012   GERD (gastroesophageal reflux disease) 10/12/2010   EGD, positive H. pylori   HSV infection    History   Post-operative nausea and vomiting    Seasonal allergies     Patient Active Problem List   Diagnosis Date Noted   Rash 03/29/2022   Elevated blood pressure reading 03/07/2022   Vaginal irritation 01/25/2022   Dysuria 01/20/2022   Hip pain 07/05/2021   Right hip pain 03/01/2021   Plantar fasciitis 01/05/2021   Hyperglycemia 07/23/2020   Sciatica 02/23/2020   Tinnitus 12/02/2019   Belching    Globus sensation    Hot flashes 07/01/2019   Loss of taste 02/05/2019   Headache 08/28/2018   Exposure to COVID-19 virus 08/24/2018   Chest pain 10/30/2017   Witnessed apneic spells 10/31/2016   Other neutropenia ([place]) 08/10/2016   Vertigo 07/28/2016   Health care maintenance 11/21/2014   History of colonic polyps 11/21/2014   Dysphagia 11/19/2014   Trochanteric bursitis of left hip 10/08/2014   URI (upper respiratory infection) 02/10/2014   Pelvic pain in female 12/25/2013   Personal history of ovarian cyst 12/25/2013   Change in bowel movement 11/11/2013    breast tenderness 09/29/2013   Dysphagia, unspecified(787.20) 06/12/2012   Barrett's esophagus 06/12/2012   Environmental allergies 02/13/2012   Enlarged thyroid 02/13/2012   GERD (gastroesophageal reflux disease) 02/08/2012   Leukopenia 02/08/2012   Fatigue 02/08/2012   Hypercholesterolemia 02/08/2012    Past Surgical History:  Procedure Laterality Date   24 HOUR [place] STUDY N/A 07/25/2019   Procedure: 24 HOUR [place] STUDY;  Surgeon: Lavena Bullion, DO;  Location: [facility];  Service: Gastroenterology;  Laterality: N/A;   ABDOMINAL HYSTERECTOMY     BALLOON DILATION N/A 07/24/2012   Procedure: BALLOON DILATION;  Surgeon: Inda Castle, MD;  Location: [facility];  Service: Endoscopy;  Laterality: N/A;   BRAVO [place] STUDY N/A 07/24/2012   Procedure: BRAVO [place] STUDY;  Surgeon: Inda Castle, MD;  Location: [facility];  Service: Endoscopy;  Laterality: N/A;   COLONOSCOPY     DILATION AND CURETTAGE OF UTERUS     SAB   ESOPHAGEAL MANOMETRY N/A 07/25/2019   Procedure: ESOPHAGEAL MANOMETRY (EM);  Surgeon: Lavena Bullion, DO;  Location: [facility];  Service: Gastroenterology;  Laterality: N/A;   ESOPHAGOGASTRODUODENOSCOPY N/A 07/24/2012   Procedure: ESOPHAGOGASTRODUODENOSCOPY (EGD);  Surgeon: Inda Castle, MD;  Location: [facility];  Service: Endoscopy;  Laterality: N/A;   OVARIAN CYST REMOVAL  2004   laparotomy -  left   ROBOTIC ASSISTED LAPAROSCOPIC LYSIS OF ADHESION N/A 03/21/2014   Procedure: ROBOTIC ASSISTED LAPAROSCOPIC EXTENSIVE LYSIS OF ADHESION (1 Hour);  Surgeon: Marvene Staff, MD;  Location: [facility];  Service: Gynecology;  Laterality: N/A;   ROBOTIC ASSISTED SALPINGO OOPHERECTOMY Left 03/21/2014   Procedure:  ROBOTIC ASSISTED LEFT OOPHORECTOMY;  Surgeon: Marvene Staff, MD;  Location: [facility];  Service: Gynecology;  Laterality: Left;   rotator cuff surgery  2017   SHOULDER ARTHROSCOPY Left    TENNIS ELBOW RELEASE/NIRSCHEL PROCEDURE Right 05/01/2015    Procedure: RIGHT ELBOW DEBRIDEMENT AND TENDON REPAIR;  Surgeon: [name], MD;  Location: [facility];  Service: Orthopedics;  Laterality: Right;   UPPER GASTROINTESTINAL ENDOSCOPY     WISDOM TOOTH EXTRACTION      OB History   No obstetric history on file.      Home Medications    Prior to Admission medications   Medication Sig Start Date End Date Taking? Authorizing Provider  benzonatate (TESSALON) 100 MG capsule Take 2 capsules (200 mg total) by mouth every 8 (eight) hours. 05/26/22  Yes Margarette Canada, NP  ipratropium (ATROVENT) 0.06 % nasal spray Place 2 sprays into both nostrils 4 (four) times daily. 05/26/22  Yes Margarette Canada, NP  promethazine-dextromethorphan (PROMETHAZINE-DM) 6.25-15 MG/5ML syrup Take 5 mLs by mouth 4 (four) times daily as needed. 05/26/22  Yes Margarette Canada, NP  acyclovir (ZOVIRAX) 400 MG tablet Take 1 tablet (400 mg total) by mouth 2 (two) times daily as needed. 03/29/22   Einar Pheasant, MD  Calcium Carbonate-Vitamin D 600-400 MG-UNIT tablet Take 1 tablet by mouth daily.    [provider]  diphenhydrAMINE (BENADRYL) 12.5 MG/5ML elixir Take by mouth as needed.    [provider]  famotidine (PEPCID) 40 MG tablet TAKE 1 TABLET BY MOUTH TWICE A DAY 05/14/22   Noralyn Pick, NP  ferrous sulfate 325 (65 FE) MG tablet Take 325 mg by mouth daily.     [provider]  fluconazole (DIFLUCAN) 150 MG tablet Take one tablet x 1 and if persistent symptoms, may repeat x 1 03/26/22   Einar Pheasant, MD  Fluocinolone Acetonide Body 0.01 % OIL Apply to scalp before shampooing. Use for 6-10 hours with the cap each week to every other week. 08/19/16   [provider]  fluticasone (FLONASE) 50 MCG/ACT nasal spray Place 2 sprays into both nostrils daily. To use in place of your Nasonex 10/10/20   Hughie Closs, PA-C  mometasone (NASONEX) 50 MCG/ACT nasal spray PLACE 2 SPRAYS INTO THE NOSE DAILY. 03/31/21   Einar Pheasant, MD  nystatin cream (MYCOSTATIN) Apply 1 Application topically 2 (two) times daily. Apply to affected area under left breast bid 03/26/22   Einar Pheasant, MD  vitamin E 400 UNIT capsule Take 400 Units by mouth daily. Reported on 10/09/2015    [provider]    Family History Family History  Problem Relation Age of Onset   Arthritis Mother    Stroke Mother    Hypertension Mother    Heart failure Mother    Dementia Mother    Lung cancer Father    Diabetes Sister    Diabetes Brother        x 2   Breast cancer Maternal Aunt    Throat cancer Maternal Aunt        Smoker   Esophageal cancer Maternal Aunt    Prostate cancer Maternal Uncle  x 2   Diabetes Maternal Uncle    Diabetes Paternal Grandmother    Diabetes Paternal Grandfather    Hypertension Other    Hyperlipidemia Other    Colon cancer Neg Hx    Stomach cancer Neg Hx    Rectal cancer Neg Hx    Colon polyps Neg Hx     Social History Social History   Tobacco Use   Smoking status: Never   Smokeless tobacco: Never  Vaping Use   Vaping Use: Never used  Substance Use Topics   Alcohol use: Yes    Alcohol/week: 0.0 standard drinks of alcohol    Comment: occassional   Drug use: No     Allergies   Adhesive [tape], Aspirin, Ibuprofen, Penicillins, Nsaids, Oxycodone, and Shrimp [shellfish allergy]   Review of Systems Review of Systems  Constitutional:  Positive for fatigue. Negative for fever.  HENT:  Positive for congestion, rhinorrhea, sinus pressure and sore throat. Negative for ear pain.   Respiratory:  Positive for cough. Negative for shortness of breath and wheezing.   Gastrointestinal:  Negative for diarrhea, nausea and vomiting.     Physical Exam Triage Vital Signs ED Triage Vitals [05/26/22 1612]  Enc Vitals Group     BP      Pulse      Resp      Temp      Temp src      SpO2      Weight      Height      Head Circumference      Peak Flow      Pain Score 0     Pain Loc       Pain Edu?      Excl. in [place]?    No data found.  Updated Vital Signs BP (!) 147/97 (BP Location: Left Arm)   Pulse 84   Temp 98.6 F (37 C) (Oral)   Resp 16   LMP 10/06/2011   SpO2 100%   Visual Acuity Right Eye Distance:   Left Eye Distance:   Bilateral Distance:    Right Eye Near:   Left Eye Near:    Bilateral Near:     Physical Exam Vitals and nursing note reviewed.  Constitutional:      Appearance: Normal appearance. She is not ill-appearing.  HENT:     Head: Normocephalic and atraumatic.     Right Ear: Tympanic membrane, ear canal and external ear normal. There is no impacted cerumen.     Left Ear: Tympanic membrane, ear canal and external ear normal. There is no impacted cerumen.     Nose: Congestion and rhinorrhea present.     Comments: Nasal mucosa is erythematous and edematous with clear discharge in both nares.    Mouth/Throat:     Mouth: Mucous membranes are moist.     Pharynx: Oropharynx is clear. Posterior oropharyngeal erythema present. No oropharyngeal exudate.     Comments: Your oropharynx is erythematous and injected with clear postnasal drip. Cardiovascular:     Rate and Rhythm: Normal rate and regular rhythm.     Pulses: Normal pulses.     Heart sounds: Normal heart sounds. No murmur heard.    No gallop.  Pulmonary:     Effort: Pulmonary effort is normal.     Breath sounds: Rales present. No wheezing or rhonchi.     Comments: Patient has fine rales in bilateral bases. Musculoskeletal:     Cervical back: Normal range of motion and  neck supple.  Lymphadenopathy:     Cervical: No cervical adenopathy.  Skin:    General: Skin is warm.     Capillary Refill: Capillary refill takes less than 2 seconds.  Neurological:     General: No focal deficit present.     Mental Status: She is alert and oriented to person, place, and time.  Psychiatric:        Mood and Affect: Mood normal.        Behavior: Behavior normal.        Thought Content: Thought  content normal.        Judgment: Judgment normal.      UC Treatments / Results  Labs (all labs ordered are listed, but only abnormal results are displayed) Labs Reviewed - No data to display  EKG   Radiology DG Chest 2 View  Result Date: 05/26/2022 CLINICAL DATA:  Productive cough for 3 days EXAM: CHEST - 2 VIEW COMPARISON:  September 13, 2014 FINDINGS: The heart size and mediastinal contours are within normal limits. Both lungs are clear. The visualized skeletal structures are unremarkable. IMPRESSION: No active cardiopulmonary disease. Electronically Signed   By: Lucienne Capers M.D.   On: 05/26/2022 17:08    Procedures Procedures (including critical care time)  Medications Ordered in UC Medications - No data to display  Initial Impression / Assessment and Plan / UC Course  I have reviewed the triage vital signs and the nursing notes.  Pertinent labs & imaging results that were available during my care of the patient were reviewed by me and considered in my medical decision making (see chart for details).   Patient is a pleasant, nontoxic-appearing 57 year old female here for evaluation of 3 days worth of respiratory complaints as outlined in HPI above.  The patient has a past medical history that significant for anemia, arthritis, and GERD.  She does have inflamed nasal mucosa with clear discharge in both nares as well as erythematous posterior oropharynx with clear postnasal drip.  She is able to speak in full sentences without dyspnea or tachypnea but she does have fine rales bilateral bases.  Her vital signs are normal and oxygen saturations are 100% on room air and respiratory to 16.  I will obtain a chest x-ray to rule out the presence of any acute intrathoracic pathology.  Chest x-ray independently reviewed and evaluated by me.  Impression: Lung fields are well aerated and the costophrenic angles are crisp.  No infiltrate or effusion noted.  Radiology overread is  pending. Radiology impression states no active cardiopulmonary disease.  I will discharge patient home with a diagnosis of viral URI with cough.  I will treat her symptoms with Atrovent nasal spray, Tessalon Perles, and Promethazine DM cough syrup.  Over-the-counter Tylenol and/or ibuprofen as needed for fever and pain.  Return precautions reviewed.   Final Clinical Impressions(s) / UC Diagnoses   Final diagnoses:  Viral URI with cough     Discharge Instructions      Your chest x-ray did not demonstrate any evidence of pneumonia or other infectious process in your chest.  I believe you have a viral respiratory infection and the drainage from your nasal passages is what is causing your sputum production in your cough.  Please take over-the-counter Tylenol and/or ibuprofen according to the package instructions as needed for fever and pain.  Use the Atrovent nasal spray, 2 squirts in each nostril every 6 hours, as needed for runny nose and postnasal drip.  Use the [employer]  Perles every 8 hours during the day.  Take them with a small sip of water.  They may give you some numbness to the base of your tongue or a metallic taste in your mouth, this is normal.  Use the Promethazine DM cough syrup at bedtime for cough and congestion.  It will make you drowsy so do not take it during the day.  Return for reevaluation or see your primary care provider for any new or worsening symptoms.      ED Prescriptions     Medication Sig Dispense Auth. Provider   benzonatate (TESSALON) 100 MG capsule Take 2 capsules (200 mg total) by mouth every 8 (eight) hours. 21 capsule Margarette Canada, NP   ipratropium (ATROVENT) 0.06 % nasal spray Place 2 sprays into both nostrils 4 (four) times daily. 15 mL Margarette Canada, NP   promethazine-dextromethorphan (PROMETHAZINE-DM) 6.25-15 MG/5ML syrup Take 5 mLs by mouth 4 (four) times daily as needed. 118 mL Margarette Canada, NP      PDMP not reviewed this encounter.    Margarette Canada, NP 05/26/22 [id number]

## 2022-05-26 NOTE — ED Triage Notes (Addendum)
Pt c/o nasal congestion, fatigue, chest congestion, sore throat only in the morning, some cough and sneezing x 3 days. Pt took 2 at home covid test that were negative.

## 2022-05-26 NOTE — Discharge Instructions (Addendum)
Your chest x-ray did not demonstrate any evidence of pneumonia or other infectious process in your chest.  I believe you have a viral respiratory infection and the drainage from your nasal passages is what is causing your sputum production in your cough.  Please take over-the-counter Tylenol and/or ibuprofen according to the package instructions as needed for fever and pain.  Use the Atrovent nasal spray, 2 squirts in each nostril every 6 hours, as needed for runny nose and postnasal drip.  Use the Tessalon Perles every 8 hours during the day.  Take them with a small sip of water.  They may give you some numbness to the base of your tongue or a metallic taste in your mouth, this is normal.  Use the Promethazine DM cough syrup at bedtime for cough and congestion.  It will make you drowsy so do not take it during the day.  Return for reevaluation or see your primary care provider for any new or worsening symptoms.

## 2022-06-23 ENCOUNTER — Other Ambulatory Visit (INDEPENDENT_AMBULATORY_CARE_PROVIDER_SITE_OTHER): Payer: 59

## 2022-06-23 ENCOUNTER — Encounter: Payer: Self-pay | Admitting: Internal Medicine

## 2022-06-23 DIAGNOSIS — D72819 Decreased white blood cell count, unspecified: Secondary | ICD-10-CM | POA: Diagnosis not present

## 2022-06-23 DIAGNOSIS — R739 Hyperglycemia, unspecified: Secondary | ICD-10-CM

## 2022-06-23 DIAGNOSIS — E7800 Pure hypercholesterolemia, unspecified: Secondary | ICD-10-CM | POA: Diagnosis not present

## 2022-06-23 LAB — HEPATIC FUNCTION PANEL
ALT: 14 U/L (ref 0–35)
AST: 17 U/L (ref 0–37)
Albumin: 4.1 g/dL (ref 3.5–5.2)
Alkaline Phosphatase: 61 U/L (ref 39–117)
Bilirubin, Direct: 0.1 mg/dL (ref 0.0–0.3)
Total Bilirubin: 0.5 mg/dL (ref 0.2–1.2)
Total Protein: 7.1 g/dL (ref 6.0–8.3)

## 2022-06-23 LAB — BASIC METABOLIC PANEL
BUN: 9 mg/dL (ref 6–23)
CO2: 30 mEq/L (ref 19–32)
Calcium: 9.5 mg/dL (ref 8.4–10.5)
Chloride: 104 mEq/L (ref 96–112)
Creatinine, Ser: 0.86 mg/dL (ref 0.40–1.20)
GFR: 75.21 mL/min (ref >60.00)
Glucose, Bld: 101 mg/dL — ABNORMAL HIGH (ref 70–99)
Potassium: 4.7 mEq/L (ref 3.5–5.1)
Sodium: 141 mEq/L (ref 135–145)

## 2022-06-23 LAB — CBC WITH DIFFERENTIAL/PLATELET
Basophils Absolute: 0 10*3/uL (ref 0.0–0.1)
Basophils Relative: 1 % (ref 0.0–3.0)
Eosinophils Absolute: 0 10*3/uL (ref 0.0–0.7)
Eosinophils Relative: 1.3 % (ref 0.0–5.0)
HCT: 43.3 % (ref 36.0–46.0)
Hemoglobin: 14.5 g/dL (ref 12.0–15.0)
Lymphocytes Relative: 52.7 % — ABNORMAL HIGH (ref 12.0–46.0)
Lymphs Abs: 1.6 10*3/uL (ref 0.7–4.0)
MCHC: 33.4 g/dL (ref 30.0–36.0)
MCV: 91.2 fl (ref 78.0–100.0)
Monocytes Absolute: 0.3 10*3/uL (ref 0.1–1.0)
Monocytes Relative: 9.6 % (ref 3.0–12.0)
Neutro Abs: 1.1 10*3/uL — ABNORMAL LOW (ref 1.4–7.7)
Neutrophils Relative %: 35.4 % — ABNORMAL LOW (ref 43.0–77.0)
Platelets: 185 10*3/uL (ref 150.0–400.0)
RBC: 4.75 Mil/uL (ref 3.87–5.11)
RDW: 15.1 % (ref 11.5–15.5)
WBC: 3 10*3/uL — ABNORMAL LOW (ref 4.0–10.5)

## 2022-06-23 LAB — LIPID PANEL
Cholesterol: 236 mg/dL — ABNORMAL HIGH (ref 0–200)
HDL: 90.9 mg/dL (ref >39.00)
LDL Cholesterol: 129 mg/dL — ABNORMAL HIGH (ref 0–99)
NonHDL: 145.26
Total CHOL/HDL Ratio: 3
Triglycerides: 82 mg/dL (ref 0.0–149.0)
VLDL: 16.4 mg/dL (ref 0.0–40.0)

## 2022-06-23 LAB — HEMOGLOBIN A1C: Hgb A1c MFr Bld: 6.2 % (ref 4.6–6.5)

## 2022-06-25 ENCOUNTER — Ambulatory Visit: Payer: 59 | Admitting: Internal Medicine

## 2022-06-25 ENCOUNTER — Encounter: Payer: Self-pay | Admitting: Internal Medicine

## 2022-06-25 VITALS — BP 130/72 | HR 75 | Temp 98.0°F | Resp 16 | Ht 64.0 in | Wt 197.4 lb

## 2022-06-25 DIAGNOSIS — D708 Other neutropenia: Secondary | ICD-10-CM | POA: Diagnosis not present

## 2022-06-25 DIAGNOSIS — R059 Cough, unspecified: Secondary | ICD-10-CM | POA: Diagnosis not present

## 2022-06-25 DIAGNOSIS — R739 Hyperglycemia, unspecified: Secondary | ICD-10-CM | POA: Diagnosis not present

## 2022-06-25 DIAGNOSIS — E049 Nontoxic goiter, unspecified: Secondary | ICD-10-CM

## 2022-06-25 DIAGNOSIS — D72819 Decreased white blood cell count, unspecified: Secondary | ICD-10-CM

## 2022-06-25 DIAGNOSIS — Z860100 Personal history of colon polyps, unspecified: Secondary | ICD-10-CM

## 2022-06-25 DIAGNOSIS — E7800 Pure hypercholesterolemia, unspecified: Secondary | ICD-10-CM

## 2022-06-25 DIAGNOSIS — K219 Gastro-esophageal reflux disease without esophagitis: Secondary | ICD-10-CM

## 2022-06-25 DIAGNOSIS — Z9109 Other allergy status, other than to drugs and biological substances: Secondary | ICD-10-CM | POA: Diagnosis not present

## 2022-06-25 MED ORDER — LEVOCETIRIZINE DIHYDROCHLORIDE 5 MG PO TABS: 5.0000 mg | ORAL_TABLET | Freq: Every day | ORAL | 1 refills | Status: DC

## 2022-06-25 MED ORDER — AZELASTINE HCL 0.1 % NA SOLN: 1.0000 | Freq: Two times a day (BID) | NASAL | 0 refills | Status: AC

## 2022-06-25 MED ORDER — AZITHROMYCIN 250 MG PO TABS: ORAL_TABLET | ORAL | 0 refills | Status: AC

## 2022-06-25 NOTE — Patient Instructions (Signed)
Continue famotidine (pepcid)  Stop zyrtec  Start xyzal - one per day  Add astelin nasal spray - 1 spray each nostril bid.   Zpak as directed.

## 2022-06-25 NOTE — Progress Notes (Unsigned)
Subjective:    Patient ID: Kelly Massey, female    DOB: [birth year], 57 y.o.   MRN: [id number]  Patient here for  Chief Complaint  Patient presents with   Medical Management of Chronic Issues    HPI Here for a scheduled follow up.  Here to follow up regarding GERD and leukopenia.  Was seen at urgent care 05/26/22 - diagnosed with viral URI.  Was treated with atrovent nasal spray, tessalon perles and promethazine DM cough syrup.  Did not tolerate atrovent nasal spray.  Reports overall doing well.  She feels most of her symptoms now are allergy related.  Denies fever.  Symptoms controlled until she goes outside. After walking outside, will noticed increased nasal congestion and drainage and increased cough.  Is taking otc zyrtec.  Using nasacort.  Persistent symptoms.  Discussed seeing an allergist - given ongoing issues.  Does report some increased colored congestion in am.  Increased cough.  No chest pain or sob.  No abdominal pain.  Feels acid reflux is controlled on her current regimen.     Past Medical History:  Diagnosis Date   Allergy    Anemia    Arthritis    knees - no meds   Colon polyps    HPP_ 2012   GERD (gastroesophageal reflux disease) 10/12/2010   EGD, positive H. pylori   HSV infection    History   Post-operative nausea and vomiting    Seasonal allergies    Past Surgical History:  Procedure Laterality Date   65 HOUR [place] STUDY N/A 07/25/2019   Procedure: 24 HOUR [place] STUDY;  Surgeon: Lavena Bullion, DO;  Location: [facility];  Service: Gastroenterology;  Laterality: N/A;   ABDOMINAL HYSTERECTOMY     BALLOON DILATION N/A 07/24/2012   Procedure: BALLOON DILATION;  Surgeon: Inda Castle, MD;  Location: [facility];  Service: Endoscopy;  Laterality: N/A;   BRAVO [place] STUDY N/A 07/24/2012   Procedure: BRAVO [place] STUDY;  Surgeon: Inda Castle, MD;  Location: [facility];  Service: Endoscopy;  Laterality: N/A;   COLONOSCOPY     DILATION AND CURETTAGE OF UTERUS      SAB   ESOPHAGEAL MANOMETRY N/A 07/25/2019   Procedure: ESOPHAGEAL MANOMETRY (EM);  Surgeon: Lavena Bullion, DO;  Location: [facility];  Service: Gastroenterology;  Laterality: N/A;   ESOPHAGOGASTRODUODENOSCOPY N/A 07/24/2012   Procedure: ESOPHAGOGASTRODUODENOSCOPY (EGD);  Surgeon: Inda Castle, MD;  Location: [facility];  Service: Endoscopy;  Laterality: N/A;   OVARIAN CYST REMOVAL  2004   laparotomy -left   ROBOTIC ASSISTED LAPAROSCOPIC LYSIS OF ADHESION N/A 03/21/2014   Procedure: ROBOTIC ASSISTED LAPAROSCOPIC EXTENSIVE LYSIS OF ADHESION (1 Hour);  Surgeon: Marvene Staff, MD;  Location: [facility];  Service: Gynecology;  Laterality: N/A;   ROBOTIC ASSISTED SALPINGO OOPHERECTOMY Left 03/21/2014   Procedure:  ROBOTIC ASSISTED LEFT OOPHORECTOMY;  Surgeon: Marvene Staff, MD;  Location: [facility];  Service: Gynecology;  Laterality: Left;   rotator cuff surgery  2017   SHOULDER ARTHROSCOPY Left    TENNIS ELBOW RELEASE/NIRSCHEL PROCEDURE Right 05/01/2015   Procedure: RIGHT ELBOW DEBRIDEMENT AND TENDON REPAIR;  Surgeon: [name], MD;  Location: [facility];  Service: Orthopedics;  Laterality: Right;   UPPER GASTROINTESTINAL ENDOSCOPY     WISDOM TOOTH EXTRACTION     Family History  Problem Relation Age of Onset   Arthritis Mother    Stroke Mother    Hypertension Mother    Heart failure Mother  Dementia Mother    Lung cancer Father    Diabetes Sister    Diabetes Brother        x 2   Breast cancer Maternal Aunt    Throat cancer Maternal Aunt        Smoker   Esophageal cancer Maternal Aunt    Prostate cancer Maternal Uncle        x 2   Diabetes Maternal Uncle    Diabetes Paternal Grandmother    Diabetes Paternal Grandfather    Hypertension Other    Hyperlipidemia Other    Colon cancer Neg Hx    Stomach cancer Neg Hx    Rectal cancer Neg Hx    Colon polyps Neg Hx    Social History   Socioeconomic History   Marital status: Married     Spouse name: Not on file   Number of children: 0   Years of education: Not on file   Highest education level: Not on file  Occupational History   Occupation: RESEARCH    Employer: [employer]  Tobacco Use   Smoking status: Never   Smokeless tobacco: Never  Vaping Use   Vaping Use: Never used  Substance and Sexual Activity   Alcohol use: Yes    Alcohol/week: 0.0 standard drinks of alcohol    Comment: occassional   Drug use: No   Sexual activity: Yes    Birth control/protection: None, Surgical    Comment: hysterectomy  Other Topics Concern   Not on file  Social History Narrative   Transport planner    Social Determinants of Health   Financial Resource Strain: Not on file  Food Insecurity: Not on file  Transportation Needs: Not on file  Physical Activity: Not on file  Stress: Not on file  Social Connections: Not on file     Review of Systems  Constitutional:  Negative for appetite change and fever.  HENT:  Positive for congestion and postnasal drip.   Respiratory:  Positive for cough. Negative for chest tightness and shortness of breath.   Cardiovascular:  Negative for chest pain and palpitations.  Gastrointestinal:  Negative for abdominal pain, diarrhea, nausea and vomiting.  Genitourinary:  Negative for difficulty urinating and dysuria.  Musculoskeletal:  Negative for joint swelling and myalgias.  Skin:  Negative for color change and rash.  Neurological:  Negative for dizziness and headaches.  Psychiatric/Behavioral:  Negative for agitation and dysphoric mood.        Objective:     BP 130/72   Pulse 75   Temp 98 F (36.7 C)   Resp 16   Ht 5\' 4"  (1.626 m)   Wt 197 lb 6.4 oz (89.5 kg)   LMP 10/06/2011   SpO2 99%   BMI 33.88 kg/m  Wt Readings from Last 3 Encounters:  06/25/22 197 lb 6.4 oz (89.5 kg)  03/26/22 191 lb 9.6 oz (86.9 kg)  03/02/22 194 lb 12.8 oz (88.4 kg)    Physical Exam Vitals reviewed.  Constitutional:      General: She is not in  acute distress.    Appearance: Normal appearance.  HENT:     Head: Normocephalic and atraumatic.     Right Ear: External ear normal.     Left Ear: External ear normal.  Eyes:     General: No scleral icterus.       Right eye: No discharge.        Left eye: No discharge.     Conjunctiva/sclera: Conjunctivae normal.  Neck:     Thyroid: No thyromegaly.  Cardiovascular:     Rate and Rhythm: Normal rate and regular rhythm.  Pulmonary:     Effort: No respiratory distress.     Breath sounds: Normal breath sounds. No wheezing.  Abdominal:     General: Bowel sounds are normal.     Palpations: Abdomen is soft.     Tenderness: There is no abdominal tenderness.  Musculoskeletal:        General: No swelling or tenderness.     Cervical back: Neck supple. No tenderness.  Lymphadenopathy:     Cervical: No cervical adenopathy.  Skin:    Findings: No erythema or rash.  Neurological:     Mental Status: She is alert.  Psychiatric:        Mood and Affect: Mood normal.        Behavior: Behavior normal.      Outpatient Encounter Medications as of 06/25/2022  Medication Sig   azelastine (ASTELIN) 0.1 % nasal spray Place 1 spray into both nostrils 2 (two) times daily. Use in each nostril as directed   azithromycin (ZITHROMAX) 250 MG tablet Take 2 tablets on day 1, then 1 tablet daily on days 2 through 5   levocetirizine (XYZAL) 5 MG tablet Take 1 tablet (5 mg total) by mouth daily.   acyclovir (ZOVIRAX) 400 MG tablet Take 1 tablet (400 mg total) by mouth 2 (two) times daily as needed.   Calcium Carbonate-Vitamin D 600-400 MG-UNIT tablet Take 1 tablet by mouth daily.   diphenhydrAMINE (BENADRYL) 12.5 MG/5ML elixir Take by mouth as needed.   famotidine (PEPCID) 40 MG tablet TAKE 1 TABLET BY MOUTH TWICE A DAY   ferrous sulfate 325 (65 FE) MG tablet Take 325 mg by mouth daily.    Fluocinolone Acetonide Body 0.01 % OIL Apply to scalp before shampooing. Use for 6-10 hours with the cap each week to  every other week.   fluticasone (FLONASE) 50 MCG/ACT nasal spray Place 2 sprays into both nostrils daily. To use in place of your Nasonex   mometasone (NASONEX) 50 MCG/ACT nasal spray PLACE 2 SPRAYS INTO THE NOSE DAILY.   vitamin E 400 UNIT capsule Take 400 Units by mouth daily. Reported on 10/09/2015   [DISCONTINUED] benzonatate (TESSALON) 100 MG capsule Take 2 capsules (200 mg total) by mouth every 8 (eight) hours.   [DISCONTINUED] fluconazole (DIFLUCAN) 150 MG tablet Take one tablet x 1 and if persistent symptoms, may repeat x 1   [DISCONTINUED] ipratropium (ATROVENT) 0.06 % nasal spray Place 2 sprays into both nostrils 4 (four) times daily.   [DISCONTINUED] nystatin cream (MYCOSTATIN) Apply 1 Application topically 2 (two) times daily. Apply to affected area under left breast bid   [DISCONTINUED] promethazine-dextromethorphan (PROMETHAZINE-DM) 6.25-15 MG/5ML syrup Take 5 mLs by mouth 4 (four) times daily as needed.   No facility-administered encounter medications on file as of 06/25/2022.     Lab Results  Component Value Date   WBC 3.0 (L) 06/23/2022   HGB 14.5 06/23/2022   HCT 43.3 06/23/2022   PLT 185.0 06/23/2022   GLUCOSE 101 (H) 06/23/2022   CHOL 236 (H) 06/23/2022   TRIG 82.0 06/23/2022   HDL 90.90 06/23/2022   LDLCALC 129 (H) 06/23/2022   ALT 14 06/23/2022   AST 17 06/23/2022   NA 141 06/23/2022   K 4.7 06/23/2022   CL 104 06/23/2022   CREATININE 0.86 06/23/2022   BUN 9 06/23/2022   CO2 30 06/23/2022   TSH 0.75  03/02/2022   HGBA1C 6.2 06/23/2022    DG Chest 2 View  Result Date: 05/26/2022 CLINICAL DATA:  Productive cough for 3 days EXAM: CHEST - 2 VIEW COMPARISON:  September 13, 2014 FINDINGS: The heart size and mediastinal contours are within normal limits. Both lungs are clear. The visualized skeletal structures are unremarkable. IMPRESSION: No active cardiopulmonary disease. Electronically Signed   By: Lucienne Capers M.D.   On: 05/26/2022 17:08       Assessment &  Plan:  Cough, unspecified type Assessment & Plan: Persistent cough and congestion as outlined.  Do feel a lot of her symptoms are allergy triggered.  Change antihistamine - start xyzal.  Continue nasacort.  Add astelin nasal spray.  Continue pepcid.  Acid reflux controlled.  Zpak as directed.  Follow.  Call with update.  Refer to allergist for further evaluation.   Orders: -     Ambulatory referral to Allergy  Environmental allergies Assessment & Plan: Has seen ENT previously.  Allergy tested in the past.  Has been on antihistamine previously.  Start xyzal as outlined.  Continue nasacort.  Start astelin nasal spray.  Refer to an allergist given persistent issues.    Orders: -     Ambulatory referral to Allergy  Other neutropenia [facility]) Assessment & Plan: Has been worked up by hematology.  Felt related to PPI.  Follow cbc.  Recheck with net labs.   Orders: -     CBC with Differential/Platelet; Future  Leukopenia, unspecified type Assessment & Plan: Has been worked up by hematology.  Felt related to PPI.  Follow cbc.  Recheck with net labs.   Orders: -     CBC with Differential/Platelet; Future  Hyperglycemia Assessment & Plan: Low carb diet and exercise.  Follow met b and a1c.   Orders: -     Hemoglobin A1c; Future  Hypercholesterolemia Assessment & Plan: The 10-year ASCVD risk score (Arnett DK, et al., 2019) is: 3.2%   Values used to calculate the score:     Age: 65 years     Sex: Female     Is Non-Hispanic African American: Yes     Diabetic: No     Tobacco smoker: No     Systolic Blood Pressure: [id number] mmHg     Is BP treated: No     HDL Cholesterol: 90.9 mg/dL     Total Cholesterol: 236 mg/dL  Low cholesterol diet and exercise.  Follow lipid panel.    Orders: -     Basic metabolic panel; Future -     Lipid panel; Future -     Hepatic function panel; Future  History of colonic polyps Assessment & Plan: Colonoscopy 01/25/21 - Tortuous colon. One 3 mm polyp in the  ascending colon, removed with a cold snare. Two 3 mm polyps at the recto-sigmoid colon, removed with a cold snare. Diverticulosis - sigmoid.    Gastroesophageal reflux disease without esophagitis Assessment & Plan: Continue pepcid.  Followed by GI.    Enlarged thyroid Assessment & Plan: Previous ultrasound - no nodules.  Follow tsh.   Orders: -     TSH; Future  Other orders -     Azithromycin; Take 2 tablets on day 1, then 1 tablet daily on days 2 through 5  Dispense: 6 tablet; Refill: 0 -     Azelastine HCl; Place 1 spray into both nostrils 2 (two) times daily. Use in each nostril as directed  Dispense: 30 mL; Refill: 0 -  Levocetirizine Dihydrochloride; Take 1 tablet (5 mg total) by mouth daily.  Dispense: 30 tablet; Refill: 1     Einar Pheasant, MD

## 2022-06-26 ENCOUNTER — Encounter: Payer: Self-pay | Admitting: Internal Medicine

## 2022-06-26 DIAGNOSIS — R059 Cough, unspecified: Secondary | ICD-10-CM | POA: Insufficient documentation

## 2022-06-26 NOTE — Assessment & Plan Note (Signed)
Persistent cough and congestion as outlined.  Do feel a lot of her symptoms are allergy triggered.  Change antihistamine - start xyzal.  Continue nasacort.  Add astelin nasal spray.  Continue pepcid.  Acid reflux controlled.  Zpak as directed.  Follow.  Call with update.  Refer to allergist for further evaluation.

## 2022-06-26 NOTE — Assessment & Plan Note (Signed)
Has seen ENT previously.  Allergy tested in the past.  Has been on antihistamine previously.  Start xyzal as outlined.  Continue nasacort.  Start astelin nasal spray.  Refer to an allergist given persistent issues.

## 2022-06-26 NOTE — Assessment & Plan Note (Signed)
Previous ultrasound - no nodules.  Follow tsh.  

## 2022-06-26 NOTE — Assessment & Plan Note (Signed)
Continue pepcid.  Followed by GI.  

## 2022-06-26 NOTE — Assessment & Plan Note (Signed)
Low carb diet and exercise.  Follow met b and a1c.   

## 2022-06-26 NOTE — Assessment & Plan Note (Signed)
Colonoscopy 01/25/21 - Tortuous colon. One 3 mm polyp in the ascending colon, removed with a cold snare. Two 3 mm polyps at the recto-sigmoid colon, removed with a cold snare. Diverticulosis - sigmoid.  ?

## 2022-06-26 NOTE — Assessment & Plan Note (Signed)
Has been worked up by hematology.  Felt related to PPI.  Follow cbc.  Recheck with net labs.  

## 2022-06-26 NOTE — Assessment & Plan Note (Signed)
The 10-year ASCVD risk score (Arnett DK, et al., 2019) is: 3.2%   Values used to calculate the score:     Age: 57 years     Sex: Female     Is Non-Hispanic African American: Yes     Diabetic: No     Tobacco smoker: No     Systolic Blood Pressure: [id number] mmHg     Is BP treated: No     HDL Cholesterol: 90.9 mg/dL     Total Cholesterol: 236 mg/dL  Low cholesterol diet and exercise.  Follow lipid panel.

## 2022-08-19 ENCOUNTER — Other Ambulatory Visit: Payer: Self-pay | Admitting: Nurse Practitioner

## 2022-08-19 ENCOUNTER — Other Ambulatory Visit: Payer: Self-pay | Admitting: Internal Medicine

## 2022-10-08 ENCOUNTER — Other Ambulatory Visit: Payer: Self-pay | Admitting: Internal Medicine

## 2022-10-08 DIAGNOSIS — Z1231 Encounter for screening mammogram for malignant neoplasm of breast: Secondary | ICD-10-CM

## 2022-10-11 ENCOUNTER — Other Ambulatory Visit: Payer: Self-pay | Admitting: Internal Medicine

## 2022-10-20 ENCOUNTER — Other Ambulatory Visit: Payer: Self-pay | Admitting: Internal Medicine

## 2022-10-22 ENCOUNTER — Other Ambulatory Visit (INDEPENDENT_AMBULATORY_CARE_PROVIDER_SITE_OTHER): Payer: 59

## 2022-10-22 DIAGNOSIS — D72819 Decreased white blood cell count, unspecified: Secondary | ICD-10-CM

## 2022-10-22 DIAGNOSIS — E7800 Pure hypercholesterolemia, unspecified: Secondary | ICD-10-CM

## 2022-10-22 DIAGNOSIS — R739 Hyperglycemia, unspecified: Secondary | ICD-10-CM | POA: Diagnosis not present

## 2022-10-22 DIAGNOSIS — E049 Nontoxic goiter, unspecified: Secondary | ICD-10-CM

## 2022-10-22 DIAGNOSIS — D708 Other neutropenia: Secondary | ICD-10-CM

## 2022-10-24 NOTE — Progress Notes (Signed)
Subjective:    Patient ID: Kelly Massey, female    DOB: 04-May-1965, 57 y.o.   MRN: [id number]  Patient here for  Chief Complaint  Patient presents with   Medical Management of Chronic Issues    HPI Here to follow up regarding GERD and leukopenia. Reports she is doing relatively well.  Does report having some pain - top part of her foot.  Increased pain when gets out of bed.  Feels is aggravated by having to go up and down ladder at work.  Due to see podiatry 11/03/22.  Applying icy hot.  No chest pain or sob reported.  No abdominal pain.  Taking pepcid bid - to control acid reflux.    Past Medical History:  Diagnosis Date   Allergy    Anemia    Arthritis    knees - no meds   Colon polyps    HPP_ 2012   GERD (gastroesophageal reflux disease) 10/12/2010   EGD, positive H. pylori   HSV infection    History   Post-operative nausea and vomiting    Seasonal allergies    Past Surgical History:  Procedure Laterality Date   60 HOUR PH STUDY N/A 07/25/2019   Procedure: 24 HOUR PH STUDY;  Surgeon: Shellia Cleverly, DO;  Location: [facility];  Service: Gastroenterology;  Laterality: N/A;   ABDOMINAL HYSTERECTOMY     BALLOON DILATION N/A 07/24/2012   Procedure: BALLOON DILATION;  Surgeon: Louis Meckel, MD;  Location: [facility];  Service: Endoscopy;  Laterality: N/A;   BRAVO PH STUDY N/A 07/24/2012   Procedure: BRAVO PH STUDY;  Surgeon: Louis Meckel, MD;  Location: [facility];  Service: Endoscopy;  Laterality: N/A;   COLONOSCOPY     DILATION AND CURETTAGE OF UTERUS     SAB   ESOPHAGEAL MANOMETRY N/A 07/25/2019   Procedure: ESOPHAGEAL MANOMETRY (EM);  Surgeon: Shellia Cleverly, DO;  Location: [facility];  Service: Gastroenterology;  Laterality: N/A;   ESOPHAGOGASTRODUODENOSCOPY N/A 07/24/2012   Procedure: ESOPHAGOGASTRODUODENOSCOPY (EGD);  Surgeon: Louis Meckel, MD;  Location: [facility];  Service: Endoscopy;  Laterality: N/A;   OVARIAN CYST REMOVAL  2004    laparotomy -left   ROBOTIC ASSISTED LAPAROSCOPIC LYSIS OF ADHESION N/A 03/21/2014   Procedure: ROBOTIC ASSISTED LAPAROSCOPIC EXTENSIVE LYSIS OF ADHESION (1 Hour);  Surgeon: Serita Kyle, MD;  Location: [facility];  Service: Gynecology;  Laterality: N/A;   ROBOTIC ASSISTED SALPINGO OOPHERECTOMY Left 03/21/2014   Procedure:  ROBOTIC ASSISTED LEFT OOPHORECTOMY;  Surgeon: Serita Kyle, MD;  Location: [facility];  Service: Gynecology;  Laterality: Left;   rotator cuff surgery  2017   SHOULDER ARTHROSCOPY Left    TENNIS ELBOW RELEASE/NIRSCHEL PROCEDURE Right 05/01/2015   Procedure: RIGHT ELBOW DEBRIDEMENT AND TENDON REPAIR;  Surgeon: Loreta Ave, MD;  Location: [facility];  Service: Orthopedics;  Laterality: Right;   UPPER GASTROINTESTINAL ENDOSCOPY     WISDOM TOOTH EXTRACTION     Family History  Problem Relation Age of Onset   Arthritis Mother    Stroke Mother    Hypertension Mother    Heart failure Mother    Dementia Mother    Lung cancer Father    Diabetes Sister    Diabetes Brother        x 2   Breast cancer Maternal Aunt    Throat cancer Maternal Aunt        Smoker   Esophageal cancer Maternal Aunt    Prostate cancer  Maternal Uncle        x 2   Diabetes Maternal Uncle    Diabetes Paternal Grandmother    Diabetes Paternal Grandfather    Hypertension Other    Hyperlipidemia Other    Colon cancer Neg Hx    Stomach cancer Neg Hx    Rectal cancer Neg Hx    Colon polyps Neg Hx    Social History   Socioeconomic History   Marital status: Married    Spouse name: Not on file   Number of children: 0   Years of education: Not on file   Highest education level: Some college, no degree  Occupational History   Occupation: RESEARCH    Employer: [employer]  Tobacco Use   Smoking status: Never   Smokeless tobacco: Never  Vaping Use   Vaping status: Never Used  Substance and Sexual Activity   Alcohol use: Yes    Alcohol/week: 0.0 standard drinks  of alcohol    Comment: occassional   Drug use: No   Sexual activity: Yes    Birth control/protection: None, Surgical    Comment: hysterectomy  Other Topics Concern   Not on file  Social History Narrative   Teaching laboratory technician    Social Determinants of Health   Financial Resource Strain: Low Risk  (10/21/2022)   Overall Financial Resource Strain (CARDIA)    Difficulty of Paying Living Expenses: Not hard at all  Food Insecurity: No Food Insecurity (10/21/2022)   Hunger Vital Sign    Worried About Running Out of Food in the Last Year: Never true    Ran Out of Food in the Last Year: Never true  Transportation Needs: No Transportation Needs (10/21/2022)   PRAPARE - Administrator, Civil Service (Medical): No    Lack of Transportation (Non-Medical): No  Physical Activity: Insufficiently Active (10/21/2022)   Exercise Vital Sign    Days of Exercise per Week: 2 days    Minutes of Exercise per Session: 30 min  Stress: No Stress Concern Present (10/21/2022)   [employer] of Occupational Health - Occupational Stress Questionnaire    Feeling of Stress : Not at all  Social Connections: Socially Integrated (10/21/2022)   Social Connection and Isolation Panel [NHANES]    Frequency of Communication with Friends and Family: More than three times a week    Frequency of Social Gatherings with Friends and Family: Once a week    Attends Religious Services: More than 4 times per year    Active Member of [employer] or Organizations: Yes    Attends Engineer, structural: More than 4 times per year    Marital Status: Married     Review of Systems  Constitutional:  Negative for appetite change and unexpected weight change.  HENT:  Negative for congestion and sinus pressure.   Respiratory:  Negative for cough, chest tightness and shortness of breath.   Cardiovascular:  Negative for chest pain, palpitations and leg swelling.  Gastrointestinal:  Negative for abdominal pain, diarrhea, nausea  and vomiting.  Genitourinary:  Negative for difficulty urinating and dysuria.  Musculoskeletal:  Negative for myalgias.       Foot pain as outlined.   Skin:  Negative for color change and rash.  Neurological:  Negative for dizziness and headaches.  Psychiatric/Behavioral:  Negative for agitation and dysphoric mood.        Objective:     BP 126/72   Pulse 88   Temp 97.8 F (36.6 C)  Resp 16   Ht 5\' 5"  (1.651 m)   Wt 198 lb (89.8 kg)   LMP 10/06/2011   SpO2 98%   BMI 32.95 kg/m  Wt Readings from Last 3 Encounters:  10/25/22 198 lb (89.8 kg)  06/25/22 197 lb 6.4 oz (89.5 kg)  03/26/22 191 lb 9.6 oz (86.9 kg)    Physical Exam Vitals reviewed.  Constitutional:      General: She is not in acute distress.    Appearance: Normal appearance.  HENT:     Head: Normocephalic and atraumatic.     Right Ear: External ear normal.     Left Ear: External ear normal.  Eyes:     General: No scleral icterus.       Right eye: No discharge.        Left eye: No discharge.     Conjunctiva/sclera: Conjunctivae normal.  Neck:     Thyroid: No thyromegaly.  Cardiovascular:     Rate and Rhythm: Normal rate and regular rhythm.  Pulmonary:     Effort: No respiratory distress.     Breath sounds: Normal breath sounds. No wheezing.  Abdominal:     General: Bowel sounds are normal.     Palpations: Abdomen is soft.     Tenderness: There is no abdominal tenderness.  Musculoskeletal:        General: No swelling or tenderness.     Cervical back: Neck supple. No tenderness.  Lymphadenopathy:     Cervical: No cervical adenopathy.  Skin:    Findings: No erythema or rash.  Neurological:     Mental Status: She is alert.  Psychiatric:        Mood and Affect: Mood normal.        Behavior: Behavior normal.      Outpatient Encounter Medications as of 10/25/2022  Medication Sig   acyclovir (ZOVIRAX) 400 MG tablet TAKE 1 TABLET BY MOUTH 2 TIMES DAILY AS NEEDED.   azelastine (ASTELIN) 0.1 %  nasal spray Place 1 spray into both nostrils 2 (two) times daily. Use in each nostril as directed   Calcium Carbonate-Vitamin D 600-400 MG-UNIT tablet Take 1 tablet by mouth daily.   diphenhydrAMINE (BENADRYL) 12.5 MG/5ML elixir Take by mouth as needed.   famotidine (PEPCID) 40 MG tablet TAKE 1 TABLET BY MOUTH TWICE A DAY   ferrous sulfate 325 (65 FE) MG tablet Take 325 mg by mouth daily.    fluticasone (FLONASE) 50 MCG/ACT nasal spray Place 2 sprays into both nostrils daily. To use in place of your Nasonex   levocetirizine (XYZAL) 5 MG tablet TAKE 1 TABLET (5 MG TOTAL) BY MOUTH DAILY.   mometasone (NASONEX) 50 MCG/ACT nasal spray PLACE 2 SPRAYS INTO THE NOSE DAILY.   vitamin E 400 UNIT capsule Take 400 Units by mouth daily. Reported on 10/09/2015   [DISCONTINUED] Fluocinolone Acetonide Body 0.01 % OIL Apply to scalp before shampooing. Use for 6-10 hours with the cap each week to every other week.   No facility-administered encounter medications on file as of 10/25/2022.     Lab Results  Component Value Date   WBC 3.0 (L) 06/23/2022   HGB 14.5 06/23/2022   HCT 43.3 06/23/2022   PLT 185.0 06/23/2022   GLUCOSE 101 (H) 06/23/2022   CHOL 236 (H) 06/23/2022   TRIG 82.0 06/23/2022   HDL 90.90 06/23/2022   LDLCALC 129 (H) 06/23/2022   ALT 14 06/23/2022   AST 17 06/23/2022   NA 141 06/23/2022   K  4.7 06/23/2022   CL 104 06/23/2022   CREATININE 0.86 06/23/2022   BUN 9 06/23/2022   CO2 30 06/23/2022   TSH 0.75 03/02/2022   HGBA1C 6.2 06/23/2022    DG Chest 2 View  Result Date: 05/26/2022 CLINICAL DATA:  Productive cough for 3 days EXAM: CHEST - 2 VIEW COMPARISON:  September 13, 2014 FINDINGS: The heart size and mediastinal contours are within normal limits. Both lungs are clear. The visualized skeletal structures are unremarkable. IMPRESSION: No active cardiopulmonary disease. Electronically Signed   By: Burman Nieves M.D.   On: 05/26/2022 17:08       Assessment & Plan:  Gastroesophageal  reflux disease without esophagitis Assessment & Plan: EGD 01/28/22 - Dr Threasa Alpha Z-line regular, 37 cm from the incisors. - 1 cm hiatal hernia. - Normal esophagus otherwise - empiric dilation performed to 18mm as above - Normal stomach. - Focal area of nodular / polypoid mucosa near the ampulla. Biopsied to ensure no adenomatous change. - Normal duodenum otherwise Continue pepcid.    History of colonic polyps Assessment & Plan: Colonoscopy 01/25/21 - Tortuous colon. One 3 mm polyp in the ascending colon, removed with a cold snare. Two 3 mm polyps at the recto-sigmoid colon, removed with a cold snare. Diverticulosis - sigmoid.    Hypercholesterolemia Assessment & Plan: The 10-year ASCVD risk score (Arnett DK, et al., 2019) is: 2.9%   Values used to calculate the score:     Age: 57 years     Sex: Female     Is Non-Hispanic African American: Yes     Diabetic: No     Tobacco smoker: No     Systolic Blood Pressure: 126 mmHg     Is BP treated: No     HDL Cholesterol: 90.9 mg/dL     Total Cholesterol: 236 mg/dL  Low cholesterol diet and exercise.  Follow lipid panel.     Hyperglycemia Assessment & Plan: Low carb diet and exercise.  Follow met b and a1c.    Leukopenia, unspecified type Assessment & Plan: Has been worked up by hematology.  Felt related to PPI.  Follow cbc.  Recheck with net labs.    Other neutropenia [facility]) Assessment & Plan: Has been worked up by hematology.  Felt related to PPI.  Follow cbc.  Recheck with net labs.    Pain of foot, unspecified laterality Assessment & Plan: Foot pain as outlined.  Reports feels is aggravated by climbing up ladder at work, etc.  Discussed support shoes.  Stretches.  Keep f/u with podiatry.       Dale Satanta, MD

## 2022-10-25 ENCOUNTER — Ambulatory Visit: Payer: 59 | Admitting: Internal Medicine

## 2022-10-25 ENCOUNTER — Encounter: Payer: Self-pay | Admitting: Internal Medicine

## 2022-10-25 VITALS — BP 126/72 | HR 88 | Temp 97.8°F | Resp 16 | Ht 65.0 in | Wt 198.0 lb

## 2022-10-25 DIAGNOSIS — D72819 Decreased white blood cell count, unspecified: Secondary | ICD-10-CM

## 2022-10-25 DIAGNOSIS — E7800 Pure hypercholesterolemia, unspecified: Secondary | ICD-10-CM

## 2022-10-25 DIAGNOSIS — Z86010 Personal history of colonic polyps: Secondary | ICD-10-CM | POA: Diagnosis not present

## 2022-10-25 DIAGNOSIS — M79673 Pain in unspecified foot: Secondary | ICD-10-CM

## 2022-10-25 DIAGNOSIS — R739 Hyperglycemia, unspecified: Secondary | ICD-10-CM

## 2022-10-25 DIAGNOSIS — K219 Gastro-esophageal reflux disease without esophagitis: Secondary | ICD-10-CM

## 2022-10-25 DIAGNOSIS — D708 Other neutropenia: Secondary | ICD-10-CM

## 2022-10-25 NOTE — Patient Instructions (Signed)
Lotrimin cream - apply twice a day.

## 2022-10-28 ENCOUNTER — Other Ambulatory Visit: Payer: 59

## 2022-10-30 ENCOUNTER — Encounter: Payer: Self-pay | Admitting: Internal Medicine

## 2022-10-30 DIAGNOSIS — M79673 Pain in unspecified foot: Secondary | ICD-10-CM | POA: Insufficient documentation

## 2022-10-30 NOTE — Assessment & Plan Note (Signed)
EGD 01/28/22 - Dr Threasa Alpha Z-line regular, 37 cm from the incisors. - 1 cm hiatal hernia. - Normal esophagus otherwise - empiric dilation performed to 18mm as above - Normal stomach. - Focal area of nodular / polypoid mucosa near the ampulla. Biopsied to ensure no adenomatous change. - Normal duodenum otherwise Continue pepcid.

## 2022-10-30 NOTE — Assessment & Plan Note (Signed)
Colonoscopy 01/25/21 - Tortuous colon. One 3 mm polyp in the ascending colon, removed with a cold snare. Two 3 mm polyps at the recto-sigmoid colon, removed with a cold snare. Diverticulosis - sigmoid.  

## 2022-10-30 NOTE — Assessment & Plan Note (Signed)
Has been worked up by hematology.  Felt related to PPI.  Follow cbc.  Recheck with net labs.  

## 2022-10-30 NOTE — Assessment & Plan Note (Signed)
Foot pain as outlined.  Reports feels is aggravated by climbing up ladder at work, etc.  Discussed support shoes.  Stretches.  Keep f/u with podiatry.

## 2022-10-30 NOTE — Assessment & Plan Note (Signed)
Low carb diet and exercise.  Follow met b and a1c.   

## 2022-10-30 NOTE — Assessment & Plan Note (Signed)
The 10-year ASCVD risk score (Arnett DK, et al., 2019) is: 2.9%   Values used to calculate the score:     Age: 57 years     Sex: Female     Is Non-Hispanic African American: Yes     Diabetic: No     Tobacco smoker: No     Systolic Blood Pressure: 126 mmHg     Is BP treated: No     HDL Cholesterol: 90.9 mg/dL     Total Cholesterol: 236 mg/dL  Low cholesterol diet and exercise.  Follow lipid panel.

## 2022-11-03 ENCOUNTER — Ambulatory Visit: Payer: 59 | Admitting: Podiatry

## 2022-11-03 ENCOUNTER — Ambulatory Visit
Admission: RE | Admit: 2022-11-03 | Discharge: 2022-11-03 | Disposition: A | Discharge status: Home / Self Care | Payer: 59 | Source: Ambulatory Visit | Attending: Internal Medicine | Admitting: Internal Medicine

## 2022-11-03 DIAGNOSIS — Z1231 Encounter for screening mammogram for malignant neoplasm of breast: Secondary | ICD-10-CM

## 2022-11-10 ENCOUNTER — Ambulatory Visit: Payer: 59 | Admitting: Podiatry

## 2022-11-10 ENCOUNTER — Encounter: Payer: Self-pay | Admitting: Podiatry

## 2022-11-10 ENCOUNTER — Ambulatory Visit (INDEPENDENT_AMBULATORY_CARE_PROVIDER_SITE_OTHER): Payer: 59

## 2022-11-10 DIAGNOSIS — M7752 Other enthesopathy of left foot: Secondary | ICD-10-CM

## 2022-11-10 DIAGNOSIS — M779 Enthesopathy, unspecified: Secondary | ICD-10-CM

## 2022-11-10 DIAGNOSIS — M722 Plantar fascial fibromatosis: Secondary | ICD-10-CM | POA: Diagnosis not present

## 2022-11-10 MED ORDER — METHYLPREDNISOLONE 4 MG PO TBPK: ORAL_TABLET | ORAL | 0 refills | Status: DC

## 2022-11-10 NOTE — Patient Instructions (Signed)

## 2022-11-11 NOTE — Progress Notes (Signed)
  Subjective:  Patient ID: Kelly Massey, female    DOB: 1965/07/16,  MRN: [id number]  Chief Complaint  Patient presents with   Foot Pain    "My right foot and ankle was hurting but now it is my left foot." N - foot and ankle pain L - bilateral D - 1 month right, left - 1 week O - suddenly C - sharp pain, tender A - walking T - wear those brace things that I bought on [place], [employer]    56 y.o. female presents with the above complaint. History confirmed with patient.  She has had some recurrence of her heel pain, left is worse than the right at this point  Objective:  Physical Exam: warm, good capillary refill, no trophic changes or ulcerative lesions, normal DP and PT pulses and normal sensory exam. Left Foot: point tenderness over the heel pad  Right Foot: point tenderness over the heel pad, less intense than left    Radiographs: X-ray of both feet: no fracture, dislocation, swelling or degenerative changes noted, she has a plantar cutaneous spur on the left side, overall unchanged appearance since previous radiographs, no abnormalities in the ankle Assessment:   1. Plantar fasciitis, bilateral      Plan:  Patient was evaluated and treated and all questions answered.    Plantar Fasciitis -Recommend she resume her home physical therapy plan -Injection delivered to the plantar fascia of the left foot. -Rx for methylprednisolone Dosepak. Educated on use, risks and benefits of the medication  After sterile prep with povidone-iodine solution and alcohol, the left heel was injected with  1cc 0.5% marcaine plain, 10mg  triamcinolone acetonide, and 4mg  dexamethasone was injected along  the plantar fascia at the insertion on the plantar calcaneus. The patient tolerated the procedure well without complication.  Return if symptoms worsen or fail to improve.

## 2022-11-16 ENCOUNTER — Ambulatory Visit: Payer: 59 | Admitting: Podiatry

## 2022-11-21 ENCOUNTER — Other Ambulatory Visit: Payer: Self-pay | Admitting: Nurse Practitioner

## 2022-12-10 ENCOUNTER — Other Ambulatory Visit (INDEPENDENT_AMBULATORY_CARE_PROVIDER_SITE_OTHER): Payer: 59

## 2022-12-10 DIAGNOSIS — E7800 Pure hypercholesterolemia, unspecified: Secondary | ICD-10-CM | POA: Diagnosis not present

## 2022-12-10 DIAGNOSIS — D708 Other neutropenia: Secondary | ICD-10-CM

## 2022-12-10 DIAGNOSIS — E049 Nontoxic goiter, unspecified: Secondary | ICD-10-CM

## 2022-12-10 DIAGNOSIS — D72819 Decreased white blood cell count, unspecified: Secondary | ICD-10-CM

## 2022-12-10 DIAGNOSIS — R739 Hyperglycemia, unspecified: Secondary | ICD-10-CM

## 2022-12-10 LAB — CBC WITH DIFFERENTIAL/PLATELET
Basophils Absolute: 0 10*3/uL (ref 0.0–0.1)
Basophils Relative: 1.1 % (ref 0.0–3.0)
Eosinophils Absolute: 0 10*3/uL (ref 0.0–0.7)
Eosinophils Relative: 1 % (ref 0.0–5.0)
HCT: 42.6 % (ref 36.0–46.0)
Hemoglobin: 13.8 g/dL (ref 12.0–15.0)
Lymphocytes Relative: 54.7 % — ABNORMAL HIGH (ref 12.0–46.0)
Lymphs Abs: 1.8 10*3/uL (ref 0.7–4.0)
MCHC: 32.5 g/dL (ref 30.0–36.0)
MCV: 92.8 fl (ref 78.0–100.0)
Monocytes Absolute: 0.3 10*3/uL (ref 0.1–1.0)
Monocytes Relative: 10.3 % (ref 3.0–12.0)
Neutro Abs: 1.1 10*3/uL — ABNORMAL LOW (ref 1.4–7.7)
Neutrophils Relative %: 32.9 % — ABNORMAL LOW (ref 43.0–77.0)
Platelets: 209 10*3/uL (ref 150.0–400.0)
RBC: 4.59 Mil/uL (ref 3.87–5.11)
RDW: 15.2 % (ref 11.5–15.5)
WBC: 3.2 10*3/uL — ABNORMAL LOW (ref 4.0–10.5)

## 2022-12-10 LAB — HEMOGLOBIN A1C: Hgb A1c MFr Bld: 6.1 % (ref 4.6–6.5)

## 2022-12-10 LAB — TSH: TSH: 1.36 u[IU]/mL (ref 0.35–5.50)

## 2022-12-10 LAB — HEPATIC FUNCTION PANEL
ALT: 15 U/L (ref 0–35)
AST: 18 U/L (ref 0–37)
Albumin: 4.1 g/dL (ref 3.5–5.2)
Alkaline Phosphatase: 60 U/L (ref 39–117)
Bilirubin, Direct: 0.1 mg/dL (ref 0.0–0.3)
Total Bilirubin: 0.6 mg/dL (ref 0.2–1.2)
Total Protein: 7 g/dL (ref 6.0–8.3)

## 2022-12-10 LAB — LIPID PANEL
Cholesterol: 222 mg/dL — ABNORMAL HIGH (ref 0–200)
HDL: 93.9 mg/dL (ref >39.00)
LDL Cholesterol: 114 mg/dL — ABNORMAL HIGH (ref 0–99)
NonHDL: 127.78
Total CHOL/HDL Ratio: 2
Triglycerides: 68 mg/dL (ref 0.0–149.0)
VLDL: 13.6 mg/dL (ref 0.0–40.0)

## 2022-12-10 NOTE — Addendum Note (Signed)
Addended by: Jarvis Morgan D on: 12/10/2022 07:50 AM   Modules accepted: Orders

## 2023-02-21 ENCOUNTER — Other Ambulatory Visit: Payer: Self-pay | Admitting: Nurse Practitioner

## 2023-02-25 ENCOUNTER — Ambulatory Visit: Payer: 59 | Admitting: Internal Medicine

## 2023-02-28 ENCOUNTER — Ambulatory Visit
Admission: RE | Admit: 2023-02-28 | Discharge: 2023-02-28 | Disposition: A | Discharge status: Home / Self Care | Payer: 59 | Attending: Internal Medicine | Admitting: Internal Medicine

## 2023-02-28 ENCOUNTER — Ambulatory Visit
Admission: RE | Admit: 2023-02-28 | Discharge: 2023-02-28 | Disposition: A | Discharge status: Home / Self Care | Payer: 59 | Source: Ambulatory Visit | Attending: Internal Medicine | Admitting: Internal Medicine

## 2023-02-28 ENCOUNTER — Ambulatory Visit: Payer: 59 | Admitting: Internal Medicine

## 2023-02-28 ENCOUNTER — Encounter: Payer: Self-pay | Admitting: Internal Medicine

## 2023-02-28 VITALS — BP 124/70 | HR 82 | Temp 97.9°F | Resp 16 | Ht 65.5 in | Wt 192.8 lb

## 2023-02-28 DIAGNOSIS — R079 Chest pain, unspecified: Secondary | ICD-10-CM | POA: Diagnosis present

## 2023-02-28 DIAGNOSIS — Z860100 Personal history of colon polyps, unspecified: Secondary | ICD-10-CM

## 2023-02-28 DIAGNOSIS — D708 Other neutropenia: Secondary | ICD-10-CM

## 2023-02-28 DIAGNOSIS — M722 Plantar fascial fibromatosis: Secondary | ICD-10-CM

## 2023-02-28 DIAGNOSIS — R1010 Upper abdominal pain, unspecified: Secondary | ICD-10-CM | POA: Diagnosis not present

## 2023-02-28 DIAGNOSIS — R739 Hyperglycemia, unspecified: Secondary | ICD-10-CM

## 2023-02-28 DIAGNOSIS — Z9109 Other allergy status, other than to drugs and biological substances: Secondary | ICD-10-CM

## 2023-02-28 DIAGNOSIS — R109 Unspecified abdominal pain: Secondary | ICD-10-CM | POA: Insufficient documentation

## 2023-02-28 DIAGNOSIS — K2270 Barrett's esophagus without dysplasia: Secondary | ICD-10-CM

## 2023-02-28 DIAGNOSIS — D72819 Decreased white blood cell count, unspecified: Secondary | ICD-10-CM

## 2023-02-28 DIAGNOSIS — K219 Gastro-esophageal reflux disease without esophagitis: Secondary | ICD-10-CM

## 2023-02-28 DIAGNOSIS — E7800 Pure hypercholesterolemia, unspecified: Secondary | ICD-10-CM

## 2023-02-28 LAB — HEPATIC FUNCTION PANEL
ALT: 13 U/L (ref 0–35)
AST: 15 U/L (ref 0–37)
Albumin: 4.4 g/dL (ref 3.5–5.2)
Alkaline Phosphatase: 59 U/L (ref 39–117)
Bilirubin, Direct: 0.1 mg/dL (ref 0.0–0.3)
Total Bilirubin: 0.6 mg/dL (ref 0.2–1.2)
Total Protein: 6.7 g/dL (ref 6.0–8.3)

## 2023-02-28 LAB — BASIC METABOLIC PANEL
BUN: 8 mg/dL (ref 6–23)
CO2: 31 meq/L (ref 19–32)
Calcium: 9.3 mg/dL (ref 8.4–10.5)
Chloride: 103 meq/L (ref 96–112)
Creatinine, Ser: 0.8 mg/dL (ref 0.40–1.20)
GFR: 81.64 mL/min (ref >60.00)
Glucose, Bld: 91 mg/dL (ref 70–99)
Potassium: 4.5 meq/L (ref 3.5–5.1)
Sodium: 140 meq/L (ref 135–145)

## 2023-02-28 LAB — CBC WITH DIFFERENTIAL/PLATELET
Basophils Absolute: 0 10*3/uL (ref 0.0–0.1)
Basophils Relative: 1.3 % (ref 0.0–3.0)
Eosinophils Absolute: 0 10*3/uL (ref 0.0–0.7)
Eosinophils Relative: 1.4 % (ref 0.0–5.0)
HCT: 42.4 % (ref 36.0–46.0)
Hemoglobin: 14 g/dL (ref 12.0–15.0)
Lymphocytes Relative: 52.5 % — ABNORMAL HIGH (ref 12.0–46.0)
Lymphs Abs: 1.3 10*3/uL (ref 0.7–4.0)
MCHC: 32.9 g/dL (ref 30.0–36.0)
MCV: 93.4 fL (ref 78.0–100.0)
Monocytes Absolute: 0.3 10*3/uL (ref 0.1–1.0)
Monocytes Relative: 13.3 % — ABNORMAL HIGH (ref 3.0–12.0)
Neutro Abs: 0.8 10*3/uL — ABNORMAL LOW (ref 1.4–7.7)
Neutrophils Relative %: 31.5 % — ABNORMAL LOW (ref 43.0–77.0)
Platelets: 206 10*3/uL (ref 150.0–400.0)
RBC: 4.54 Mil/uL (ref 3.87–5.11)
RDW: 14.2 % (ref 11.5–15.5)
WBC: 2.5 10*3/uL — ABNORMAL LOW (ref 4.0–10.5)

## 2023-02-28 LAB — LIPASE: Lipase: 60 U/L — ABNORMAL HIGH (ref 11.0–59.0)

## 2023-02-28 NOTE — Progress Notes (Signed)
Subjective:    Patient ID: Kelly Massey, female    DOB: 12-28-1965, 57 y.o.   MRN: [id number]  Patient here for  Chief Complaint  Patient presents with   Medical Management of Chronic Issues    HPI Here to follow up regarding GER.D and leukopenia. Taking pepcid bid to control acid reflux.  Saw podiatry 11/10/22 - plantar fasciitis.  S/p injection and methylprednisolone dosepak.  Also home PT Reports today that she has noticed increased pain - anterior chest.  Has noticed intermittently for the last 2-3 weeks.  Tender to palpation or with certain movements - stretching. No chest pain or sob with increased exertion.  States she goes up and down stairs without pain.  No sob with exertion. Also reports increased pain - under her right breast and epigastric region - only notices when bends over.  Will notice a catch and has to straighten up to relieve symptoms.  No increased cough or congestion.  Does have some "routine" cough in am and will notice some pain when coughing. Relates this to her allergies.  Taking zyrtec, xyzal and using astelin nasal spray.  Also taking pepcid to control her acid reflux. No other abdominal pain currently.  Recently noticed episode of right lower quad/suprapubic pain.  Resolved quickly and has not noticed since. Bowels are moving.  No constipation or diarrhea. No nausea or vomiting.     Past Medical History:  Diagnosis Date   Allergy    Anemia    Arthritis    knees - no meds   Colon polyps    HPP_ 2012   GERD (gastroesophageal reflux disease) 10/12/2010   EGD, positive H. pylori   HSV infection    History   Post-operative nausea and vomiting    Seasonal allergies    Past Surgical History:  Procedure Laterality Date   13 HOUR PH STUDY N/A 07/25/2019   Procedure: 24 HOUR PH STUDY;  Surgeon: Shellia Cleverly, DO;  Location: [facility];  Service: Gastroenterology;  Laterality: N/A;   ABDOMINAL HYSTERECTOMY     BALLOON DILATION N/A 07/24/2012    Procedure: BALLOON DILATION;  Surgeon: Louis Meckel, MD;  Location: [facility];  Service: Endoscopy;  Laterality: N/A;   BRAVO PH STUDY N/A 07/24/2012   Procedure: BRAVO PH STUDY;  Surgeon: Louis Meckel, MD;  Location: [facility];  Service: Endoscopy;  Laterality: N/A;   COLONOSCOPY     DILATION AND CURETTAGE OF UTERUS     SAB   ESOPHAGEAL MANOMETRY N/A 07/25/2019   Procedure: ESOPHAGEAL MANOMETRY (EM);  Surgeon: Shellia Cleverly, DO;  Location: [facility];  Service: Gastroenterology;  Laterality: N/A;   ESOPHAGOGASTRODUODENOSCOPY N/A 07/24/2012   Procedure: ESOPHAGOGASTRODUODENOSCOPY (EGD);  Surgeon: Louis Meckel, MD;  Location: [facility];  Service: Endoscopy;  Laterality: N/A;   OVARIAN CYST REMOVAL  2004   laparotomy -left   ROBOTIC ASSISTED LAPAROSCOPIC LYSIS OF ADHESION N/A 03/21/2014   Procedure: ROBOTIC ASSISTED LAPAROSCOPIC EXTENSIVE LYSIS OF ADHESION (1 Hour);  Surgeon: Serita Kyle, MD;  Location: [facility];  Service: Gynecology;  Laterality: N/A;   ROBOTIC ASSISTED SALPINGO OOPHERECTOMY Left 03/21/2014   Procedure:  ROBOTIC ASSISTED LEFT OOPHORECTOMY;  Surgeon: Serita Kyle, MD;  Location: [facility];  Service: Gynecology;  Laterality: Left;   rotator cuff surgery  2017   SHOULDER ARTHROSCOPY Left    TENNIS ELBOW RELEASE/NIRSCHEL PROCEDURE Right 05/01/2015   Procedure: RIGHT ELBOW DEBRIDEMENT AND TENDON REPAIR;  Surgeon: Loreta Ave, MD;  Location: [facility];  Service: Orthopedics;  Laterality: Right;   UPPER GASTROINTESTINAL ENDOSCOPY     WISDOM TOOTH EXTRACTION     Family History  Problem Relation Age of Onset   Arthritis Mother    Stroke Mother    Hypertension Mother    Heart failure Mother    Dementia Mother    Lung cancer Father    Diabetes Sister    Diabetes Brother        x 2   Breast cancer Maternal Aunt    Throat cancer Maternal Aunt        Smoker   Esophageal cancer Maternal Aunt    Prostate cancer Maternal  Uncle        x 2   Diabetes Maternal Uncle    Diabetes Paternal Grandmother    Diabetes Paternal Grandfather    Hypertension Other    Hyperlipidemia Other    Colon cancer Neg Hx    Stomach cancer Neg Hx    Rectal cancer Neg Hx    Colon polyps Neg Hx    Social History   Socioeconomic History   Marital status: Married    Spouse name: Not on file   Number of children: 0   Years of education: Not on file   Highest education level: Some college, no degree  Occupational History   Occupation: RESEARCH    Employer: [employer]  Tobacco Use   Smoking status: Never   Smokeless tobacco: Never  Vaping Use   Vaping status: Never Used  Substance and Sexual Activity   Alcohol use: Not Currently    Comment: occassional   Drug use: No   Sexual activity: Yes    Birth control/protection: None, Surgical    Comment: hysterectomy  Other Topics Concern   Not on file  Social History Narrative   Teaching laboratory technician    Social Determinants of Health   Financial Resource Strain: Low Risk  (02/27/2023)   Overall Financial Resource Strain (CARDIA)    Difficulty of Paying Living Expenses: Not hard at all  Food Insecurity: No Food Insecurity (02/27/2023)   Hunger Vital Sign    Worried About Running Out of Food in the Last Year: Never true    Ran Out of Food in the Last Year: Never true  Transportation Needs: No Transportation Needs (02/27/2023)   PRAPARE - Administrator, Civil Service (Medical): No    Lack of Transportation (Non-Medical): No  Physical Activity: Insufficiently Active (02/27/2023)   Exercise Vital Sign    Days of Exercise per Week: 1 day    Minutes of Exercise per Session: 30 min  Stress: No Stress Concern Present (02/27/2023)   [employer] of Occupational Health - Occupational Stress Questionnaire    Feeling of Stress : Not at all  Social Connections: Socially Integrated (02/27/2023)   Social Connection and Isolation Panel [NHANES]    Frequency of  Communication with Friends and Family: More than three times a week    Frequency of Social Gatherings with Friends and Family: Twice a week    Attends Religious Services: More than 4 times per year    Active Member of [employer] or Organizations: Yes    Attends Engineer, structural: More than 4 times per year    Marital Status: Married     Review of Systems  Constitutional:  Negative for appetite change and unexpected weight change.  HENT:  Negative for sinus pressure.  No increased congestion.   Respiratory:  Positive for cough. Negative for chest tightness and shortness of breath.   Cardiovascular:  Positive for chest pain. Negative for palpitations and leg swelling.  Gastrointestinal:  Positive for constipation. Negative for abdominal pain, diarrhea, nausea and vomiting.  Genitourinary:  Negative for difficulty urinating and dysuria.  Musculoskeletal:  Negative for joint swelling and myalgias.  Skin:  Negative for color change and rash.  Neurological:  Negative for dizziness and headaches.  Psychiatric/Behavioral:  Negative for agitation and dysphoric mood.        Objective:     BP 124/70   Pulse 82   Temp 97.9 F (36.6 C)   Resp 16   Ht 5' 5.5" (1.664 m)   Wt 192 lb 12.8 oz (87.5 kg)   LMP 10/06/2011   SpO2 99%   BMI 31.60 kg/m  Wt Readings from Last 3 Encounters:  02/28/23 192 lb 12.8 oz (87.5 kg)  10/25/22 198 lb (89.8 kg)  06/25/22 197 lb 6.4 oz (89.5 kg)    Physical Exam Vitals reviewed.  Constitutional:      General: She is not in acute distress.    Appearance: Normal appearance.  HENT:     Head: Normocephalic and atraumatic.     Right Ear: External ear normal.     Left Ear: External ear normal.  Eyes:     General: No scleral icterus.       Right eye: No discharge.        Left eye: No discharge.     Conjunctiva/sclera: Conjunctivae normal.  Neck:     Thyroid: No thyromegaly.  Cardiovascular:     Rate and Rhythm: Normal rate and regular  rhythm.     Comments: Increased pain to palpation - anterior chest.  Reproducible pain to palpation.  Pulmonary:     Effort: No respiratory distress.     Breath sounds: Normal breath sounds. No wheezing.  Abdominal:     General: Bowel sounds are normal.     Palpations: Abdomen is soft.     Tenderness: There is no abdominal tenderness.     Comments: No pain over lower anterior rib or upper abdomen with palpation.   Musculoskeletal:        General: No swelling or tenderness.     Cervical back: Neck supple. No tenderness.  Lymphadenopathy:     Cervical: No cervical adenopathy.  Skin:    Findings: No erythema or rash.  Neurological:     Mental Status: She is alert.  Psychiatric:        Mood and Affect: Mood normal.        Behavior: Behavior normal.      Outpatient Encounter Medications as of 02/28/2023  Medication Sig   acyclovir (ZOVIRAX) 400 MG tablet TAKE 1 TABLET BY MOUTH 2 TIMES DAILY AS NEEDED.   azelastine (ASTELIN) 0.1 % nasal spray Place 1 spray into both nostrils 2 (two) times daily. Use in each nostril as directed   Calcium Carbonate-Vitamin D 600-400 MG-UNIT tablet Take 1 tablet by mouth daily.   diphenhydrAMINE (BENADRYL) 12.5 MG/5ML elixir Take by mouth as needed.   famotidine (PEPCID) 40 MG tablet TAKE 1 TABLET BY MOUTH TWICE A DAY   ferrous sulfate 325 (65 FE) MG tablet Take 325 mg by mouth daily.    fluticasone (FLONASE) 50 MCG/ACT nasal spray Place 2 sprays into both nostrils daily. To use in place of your Nasonex   levocetirizine (XYZAL) 5 MG tablet TAKE 1  TABLET (5 MG TOTAL) BY MOUTH DAILY.   mometasone (NASONEX) 50 MCG/ACT nasal spray PLACE 2 SPRAYS INTO THE NOSE DAILY.   vitamin E 400 UNIT capsule Take 400 Units by mouth daily. Reported on 10/09/2015   [DISCONTINUED] methylPREDNISolone (MEDROL DOSEPAK) 4 MG TBPK tablet 6 day dose pack - take as directed   No facility-administered encounter medications on file as of 02/28/2023.     Lab Results  Component  Value Date   WBC 2.5 (L) 02/28/2023   HGB 14.0 02/28/2023   HCT 42.4 02/28/2023   PLT 206.0 02/28/2023   GLUCOSE 91 02/28/2023   CHOL 222 (H) 12/10/2022   TRIG 68.0 12/10/2022   HDL 93.90 12/10/2022   LDLCALC 114 (H) 12/10/2022   ALT 13 02/28/2023   AST 15 02/28/2023   NA 140 02/28/2023   K 4.5 02/28/2023   CL 103 02/28/2023   CREATININE 0.80 02/28/2023   BUN 8 02/28/2023   CO2 31 02/28/2023   TSH 1.36 12/10/2022   HGBA1C 6.1 12/10/2022    MM 3D SCREENING MAMMOGRAM BILATERAL BREAST  Result Date: 11/04/2022 CLINICAL DATA:  Screening. EXAM: DIGITAL SCREENING BILATERAL MAMMOGRAM WITH TOMOSYNTHESIS AND CAD TECHNIQUE: Bilateral screening digital craniocaudal and mediolateral oblique mammograms were obtained. Bilateral screening digital breast tomosynthesis was performed. The images were evaluated with computer-aided detection. COMPARISON:  Previous exam(s). ACR Breast Density Category c: The breasts are heterogeneously dense, which may obscure small masses. FINDINGS: There are no findings suspicious for malignancy. IMPRESSION: No mammographic evidence of malignancy. A result letter of this screening mammogram will be mailed directly to the patient. RECOMMENDATION: Screening mammogram in one year. (Code:SM-B-01Y) BI-RADS CATEGORY  1: Negative. Electronically Signed   By: Sande Brothers M.D.   On: 11/04/2022 10:30       Assessment & Plan:  Chest pain, unspecified type Assessment & Plan: Chest pain as outlined. Tender to palpation. EKG - SR with no acute ischemic changes. Will check cxr.  No chest pain or sob with exertion.   Orders: -     EKG 12-Lead -     DG Chest 2 View; Future  Pain of upper abdomen Assessment & Plan: Epigastric pain as outlined.  Unclear etiology.  Check cbc, liver panel and lipase.  Recent pelvic/lower abdominal pain.  Start with labs and xray as outlined.  Consider abdominal/pelvic CT to further evaluated given persistent pain. Monitor for possible triggers.    Orders: -     CBC with Differential/Platelet -     Basic metabolic panel -     Hepatic function panel -     Lipase  Plantar fasciitis Assessment & Plan:  Saw podiatry 11/10/22 - plantar fasciitis.  S/p injection and methylprednisolone dosepak.  Also home exercises.    Other neutropenia [facility]) Assessment & Plan: Has been worked up by hematology.  Felt related to PPI.  Follow cbc.    Leukopenia, unspecified type Assessment & Plan: Has been worked up by hematology.  Felt related to PPI.  Follow cbc.     Hyperglycemia Assessment & Plan: Low carb diet and exercise.  Follow met b and a1c.    Hypercholesterolemia Assessment & Plan: The 10-year ASCVD risk score (Arnett DK, et al., 2019) is: 2.5%   Values used to calculate the score:     Age: 44 years     Sex: Female     Is Non-Hispanic African American: Yes     Diabetic: No     Tobacco smoker: No  Systolic Blood Pressure: 124 mmHg     Is BP treated: No     HDL Cholesterol: 93.9 mg/dL     Total Cholesterol: 222 mg/dL  Low cholesterol diet and exercise.  Follow lipid panel.     History of colonic polyps Assessment & Plan: Colonoscopy 01/25/21 - Tortuous colon. One 3 mm polyp in the ascending colon, removed with a cold snare. Two 3 mm polyps at the recto-sigmoid colon, removed with a cold snare. Diverticulosis - sigmoid.    Gastroesophageal reflux disease without esophagitis Assessment & Plan: EGD 01/28/22 - Dr Threasa Alpha Z-line regular, 37 cm from the incisors. - 1 cm hiatal hernia. - Normal esophagus otherwise - empiric dilation performed to 18mm as above - Normal stomach. - Focal area of nodular / polypoid mucosa near the ampulla. Biopsied to ensure no adenomatous change. - Normal duodenum otherwise Continue pepcid.    Environmental allergies Assessment & Plan: Continues xyzal and steroid nasal spray.    Barrett's esophagus without dysplasia Assessment & Plan:  Seeing GI for f/u dysphagia. EGD 01/28/22  - z line regular, 1cm hiatal hernia, normal esophagus. S/p dilation, normal stomach. Focal area of nodular / polypoid mucosa near the ampulla. Biopsied. Normal duodenum otherwise. Appears to be doing  better regarding upper GI symptoms.       Dale [place], MD

## 2023-03-03 ENCOUNTER — Other Ambulatory Visit: Payer: Self-pay | Admitting: Internal Medicine

## 2023-03-03 DIAGNOSIS — R109 Unspecified abdominal pain: Secondary | ICD-10-CM

## 2023-03-03 NOTE — Progress Notes (Signed)
Order placed for CT scan.

## 2023-03-04 ENCOUNTER — Encounter: Payer: Self-pay | Admitting: Internal Medicine

## 2023-03-04 NOTE — Assessment & Plan Note (Signed)
Low carb diet and exercise.  Follow met b and a1c.   

## 2023-03-04 NOTE — Assessment & Plan Note (Signed)
Has been worked up by hematology.  Felt related to PPI.  Follow cbc.

## 2023-03-04 NOTE — Assessment & Plan Note (Signed)
Saw podiatry 11/10/22 - plantar fasciitis.  S/p injection and methylprednisolone dosepak.  Also home exercises.

## 2023-03-04 NOTE — Assessment & Plan Note (Addendum)
Epigastric pain as outlined.  Unclear etiology.  Check cbc, liver panel and lipase.  Recent pelvic/lower abdominal pain.  Start with labs and xray as outlined.  Consider abdominal/pelvic CT to further evaluated given persistent pain. Monitor for possible triggers.

## 2023-03-04 NOTE — Assessment & Plan Note (Signed)
Continues xyzal and steroid nasal spray.

## 2023-03-04 NOTE — Assessment & Plan Note (Signed)
Chest pain as outlined. Tender to palpation. EKG - SR with no acute ischemic changes. Will check cxr.  No chest pain or sob with exertion.

## 2023-03-04 NOTE — Assessment & Plan Note (Signed)
The 10-year ASCVD risk score (Arnett DK, et al., 2019) is: 2.5%   Values used to calculate the score:     Age: 57 years     Sex: Female     Is Non-Hispanic African American: Yes     Diabetic: No     Tobacco smoker: No     Systolic Blood Pressure: 124 mmHg     Is BP treated: No     HDL Cholesterol: 93.9 mg/dL     Total Cholesterol: 222 mg/dL  Low cholesterol diet and exercise.  Follow lipid panel.

## 2023-03-04 NOTE — Assessment & Plan Note (Signed)
EGD 01/28/22 - Dr Threasa Alpha Z-line regular, 37 cm from the incisors. - 1 cm hiatal hernia. - Normal esophagus otherwise - empiric dilation performed to 18mm as above - Normal stomach. - Focal area of nodular / polypoid mucosa near the ampulla. Biopsied to ensure no adenomatous change. - Normal duodenum otherwise Continue pepcid.

## 2023-03-04 NOTE — Assessment & Plan Note (Signed)
Colonoscopy 01/25/21 - Tortuous colon. One 3 mm polyp in the ascending colon, removed with a cold snare. Two 3 mm polyps at the recto-sigmoid colon, removed with a cold snare. Diverticulosis - sigmoid.  

## 2023-03-04 NOTE — Assessment & Plan Note (Signed)
Seeing GI for f/u dysphagia. EGD 01/28/22 - z line regular, 1cm hiatal hernia, normal esophagus. S/p dilation, normal stomach. Focal area of nodular / polypoid mucosa near the ampulla. Biopsied. Normal duodenum otherwise. Appears to be doing  better regarding upper GI symptoms.

## 2023-03-16 ENCOUNTER — Ambulatory Visit: Admission: RE | Admit: 2023-03-16 | Payer: 59 | Source: Ambulatory Visit

## 2023-03-18 ENCOUNTER — Ambulatory Visit
Admission: RE | Admit: 2023-03-18 | Discharge: 2023-03-18 | Disposition: A | Discharge status: Home / Self Care | Payer: 59 | Source: Ambulatory Visit | Attending: Internal Medicine | Admitting: Internal Medicine

## 2023-03-18 DIAGNOSIS — R109 Unspecified abdominal pain: Secondary | ICD-10-CM | POA: Insufficient documentation

## 2023-03-18 MED ORDER — IOHEXOL 300 MG/ML  SOLN
100.0000 mL | Freq: Once | INTRAMUSCULAR | Status: AC | PRN
  Administered 2023-03-18: 100 mL via INTRAVENOUS

## 2023-03-28 ENCOUNTER — Other Ambulatory Visit: Payer: Self-pay | Admitting: Internal Medicine

## 2023-03-28 DIAGNOSIS — K862 Cyst of pancreas: Secondary | ICD-10-CM

## 2023-03-28 DIAGNOSIS — R935 Abnormal findings on diagnostic imaging of other abdominal regions, including retroperitoneum: Secondary | ICD-10-CM

## 2023-03-28 DIAGNOSIS — R1013 Epigastric pain: Secondary | ICD-10-CM

## 2023-03-28 DIAGNOSIS — K3189 Other diseases of stomach and duodenum: Secondary | ICD-10-CM

## 2023-03-28 NOTE — Progress Notes (Signed)
Order placed for GI referral and order placed for MRI/MRCP

## 2023-04-05 ENCOUNTER — Ambulatory Visit
Admission: RE | Admit: 2023-04-05 | Discharge: 2023-04-05 | Disposition: A | Discharge status: Home / Self Care | Payer: 59 | Source: Ambulatory Visit | Attending: Internal Medicine | Admitting: Internal Medicine

## 2023-04-05 DIAGNOSIS — R935 Abnormal findings on diagnostic imaging of other abdominal regions, including retroperitoneum: Secondary | ICD-10-CM | POA: Insufficient documentation

## 2023-04-05 DIAGNOSIS — K862 Cyst of pancreas: Secondary | ICD-10-CM | POA: Diagnosis present

## 2023-04-05 MED ORDER — GADOBUTROL 1 MMOL/ML IV SOLN
8.0000 mL | Freq: Once | INTRAVENOUS | Status: AC | PRN
  Administered 2023-04-05: 8 mL via INTRAVENOUS

## 2023-04-07 ENCOUNTER — Other Ambulatory Visit: Payer: Self-pay | Admitting: Internal Medicine

## 2023-04-07 DIAGNOSIS — K862 Cyst of pancreas: Secondary | ICD-10-CM

## 2023-04-07 DIAGNOSIS — R935 Abnormal findings on diagnostic imaging of other abdominal regions, including retroperitoneum: Secondary | ICD-10-CM

## 2023-04-08 ENCOUNTER — Telehealth: Payer: Self-pay

## 2023-04-08 ENCOUNTER — Encounter: Payer: Self-pay | Admitting: Internal Medicine

## 2023-04-08 DIAGNOSIS — R935 Abnormal findings on diagnostic imaging of other abdominal regions, including retroperitoneum: Secondary | ICD-10-CM | POA: Insufficient documentation

## 2023-04-08 NOTE — Telephone Encounter (Signed)
 Copied from CRM [id number]. Topic: General - Other >> Apr 08, 2023  9:53 AM Lorin Glass B wrote: Reason for CRM: Patient returning Daijah's call to review imaging results. Patient states she will available anytime today. Callback [phone]

## 2023-04-08 NOTE — Telephone Encounter (Signed)
 Copied from CRM [id number]. Topic: Clinical - Lab/Test Results >> Apr 08, 2023  3:28 PM Denese Killings wrote: Reason for CRM: Patient is returning call to go over MRI results.

## 2023-04-08 NOTE — Telephone Encounter (Signed)
 Attempted to return pt's call.  See result note.

## 2023-04-08 NOTE — Telephone Encounter (Signed)
 See result note.

## 2023-04-12 ENCOUNTER — Telehealth: Payer: Self-pay

## 2023-04-12 NOTE — Telephone Encounter (Signed)
 Copied from CRM [id number]. Topic: General - Other >> Apr 12, 2023 11:01 AM Danika B wrote: Reason for CRM: Patient returning missed call again to go over imaging results. States that she has already read the results, but wants confirmation if there is anything to discuss with the nurse regarding them. Callback [phone], she requested callback time for after 4:15PM

## 2023-04-12 NOTE — Telephone Encounter (Signed)
 See result note.

## 2023-04-21 ENCOUNTER — Encounter: Payer: Self-pay | Admitting: Internal Medicine

## 2023-04-21 ENCOUNTER — Telehealth: Payer: Self-pay

## 2023-04-21 ENCOUNTER — Telehealth: Payer: 59 | Admitting: Internal Medicine

## 2023-04-21 VITALS — Ht 65.5 in | Wt 197.0 lb

## 2023-04-21 DIAGNOSIS — D72819 Decreased white blood cell count, unspecified: Secondary | ICD-10-CM

## 2023-04-21 DIAGNOSIS — K219 Gastro-esophageal reflux disease without esophagitis: Secondary | ICD-10-CM

## 2023-04-21 DIAGNOSIS — R935 Abnormal findings on diagnostic imaging of other abdominal regions, including retroperitoneum: Secondary | ICD-10-CM | POA: Diagnosis not present

## 2023-04-21 NOTE — Telephone Encounter (Signed)
I did leave a voicemail on patient's mobile number asking her to please call us.

## 2023-04-21 NOTE — Telephone Encounter (Signed)
I left voicemail on patient's home number (per DPR) asking her to please call us back to verify information and collect copay for her virtual visit later today.  Patient did not answer her mobile number.

## 2023-04-21 NOTE — Progress Notes (Signed)
Patient ID: Kelly Massey, female   DOB: 04/12/1965, 58 y.o.   MRN: [id number]   Virtual Visit via video Note  I connected with Lyndee Hensen by a video enabled telemedicine application and verified that I am speaking with the correct person using two identifiers. Location patient: home Location provider: work  Persons participating in the virtual visit: patient, provider  The limitations, risks, security and privacy concerns of performing an evaluation and management service by video and the availability of in person appointments have been discussed.  It has also been discussed with the patient that there may be a patient responsible charge related to this service. The patient expressed understanding and agreed to proceed.   Reason for visit: work in appt  HPI: Work in to discuss recent MRI results. Recently had CT scan - due to epigastric abdominal pain.  CT revealed - wall thickening versus underdistention of the gastric antrum, correlate with symptoms of gastritis and consider further evaluation with endoscopy. Sigmoid colonic diverticulosis without findings of acute diverticulitis. 15 mm cyst in the tail of the pancreas, likely a side branch. IPMN. Recommend follow up pre and post contrast MRI/MRCP. Was referred back to GI for further testing regarding above. MRI 04/05/23 - Thinly septated fluid signal cystic lesion arising from the tip of the pancreatic tail measuring 2.0 x 2.0 cm. No solid component or suspicious contrast enhancement. No pancreatic ductal dilatation or surrounding inflammatory changes. This is most consistent with a benign side branch IPMN or pseudocyst and is new compared to remote prior examination dated 2014. Given size and some internal complexity, recommend follow-up MRI in 1 year to ensure stability. Discussed results with her. Overall symptoms stable. Eating. Has f/u planned with GI.    ROS: See pertinent positives and negatives per HPI.  Past Medical History:   Diagnosis Date   Allergy    Anemia    Arthritis    knees - no meds   Colon polyps    HPP_ 2012   GERD (gastroesophageal reflux disease) 10/12/2010   EGD, positive H. pylori   HSV infection    History   Post-operative nausea and vomiting    Seasonal allergies     Past Surgical History:  Procedure Laterality Date   46 HOUR PH STUDY N/A 07/25/2019   Procedure: 24 HOUR PH STUDY;  Surgeon: Shellia Cleverly, DO;  Location: [facility];  Service: Gastroenterology;  Laterality: N/A;   ABDOMINAL HYSTERECTOMY     BALLOON DILATION N/A 07/24/2012   Procedure: BALLOON DILATION;  Surgeon: Louis Meckel, MD;  Location: [facility];  Service: Endoscopy;  Laterality: N/A;   BRAVO PH STUDY N/A 07/24/2012   Procedure: BRAVO PH STUDY;  Surgeon: Louis Meckel, MD;  Location: [facility];  Service: Endoscopy;  Laterality: N/A;   COLONOSCOPY     DILATION AND CURETTAGE OF UTERUS     SAB   ESOPHAGEAL MANOMETRY N/A 07/25/2019   Procedure: ESOPHAGEAL MANOMETRY (EM);  Surgeon: Shellia Cleverly, DO;  Location: [facility];  Service: Gastroenterology;  Laterality: N/A;   ESOPHAGOGASTRODUODENOSCOPY N/A 07/24/2012   Procedure: ESOPHAGOGASTRODUODENOSCOPY (EGD);  Surgeon: Louis Meckel, MD;  Location: [facility];  Service: Endoscopy;  Laterality: N/A;   OVARIAN CYST REMOVAL  2004   laparotomy -left   ROBOTIC ASSISTED LAPAROSCOPIC LYSIS OF ADHESION N/A 03/21/2014   Procedure: ROBOTIC ASSISTED LAPAROSCOPIC EXTENSIVE LYSIS OF ADHESION (1 Hour);  Surgeon: Serita Kyle, MD;  Location: [facility];  Service: Gynecology;  Laterality: N/A;  ROBOTIC ASSISTED SALPINGO OOPHERECTOMY Left 03/21/2014   Procedure:  ROBOTIC ASSISTED LEFT OOPHORECTOMY;  Surgeon: Serita Kyle, MD;  Location: [facility];  Service: Gynecology;  Laterality: Left;   rotator cuff surgery  2017   SHOULDER ARTHROSCOPY Left    TENNIS ELBOW RELEASE/NIRSCHEL PROCEDURE Right 05/01/2015   Procedure: RIGHT ELBOW DEBRIDEMENT AND  TENDON REPAIR;  Surgeon: Loreta Ave, MD;  Location: [facility];  Service: Orthopedics;  Laterality: Right;   UPPER GASTROINTESTINAL ENDOSCOPY     WISDOM TOOTH EXTRACTION      Family History  Problem Relation Age of Onset   Arthritis Mother    Stroke Mother    Hypertension Mother    Heart failure Mother    Dementia Mother    Lung cancer Father    Diabetes Sister    Diabetes Brother        x 2   Breast cancer Maternal Aunt    Throat cancer Maternal Aunt        Smoker   Esophageal cancer Maternal Aunt    Prostate cancer Maternal Uncle        x 2   Diabetes Maternal Uncle    Diabetes Paternal Grandmother    Diabetes Paternal Grandfather    Hypertension Other    Hyperlipidemia Other    Colon cancer Neg Hx    Stomach cancer Neg Hx    Rectal cancer Neg Hx    Colon polyps Neg Hx     SOCIAL HX: reviewed.    Current Outpatient Medications:    acyclovir (ZOVIRAX) 400 MG tablet, TAKE 1 TABLET BY MOUTH 2 TIMES DAILY AS NEEDED., Disp: 60 tablet, Rfl: 1   azelastine (ASTELIN) 0.1 % nasal spray, Place 1 spray into both nostrils 2 (two) times daily. Use in each nostril as directed, Disp: 30 mL, Rfl: 0   Calcium Carbonate-Vitamin D 600-400 MG-UNIT tablet, Take 1 tablet by mouth daily., Disp: , Rfl:    diphenhydrAMINE (BENADRYL) 12.5 MG/5ML elixir, Take by mouth as needed., Disp: , Rfl:    famotidine (PEPCID) 40 MG tablet, TAKE 1 TABLET BY MOUTH TWICE A DAY, Disp: 60 tablet, Rfl: 1   ferrous sulfate 325 (65 FE) MG tablet, Take 325 mg by mouth daily. , Disp: , Rfl:    fluticasone (FLONASE) 50 MCG/ACT nasal spray, Place 2 sprays into both nostrils daily. To use in place of your Nasonex, Disp: 16 mL, Rfl: 0   levocetirizine (XYZAL) 5 MG tablet, TAKE 1 TABLET (5 MG TOTAL) BY MOUTH DAILY., Disp: 30 tablet, Rfl: 11   meloxicam (MOBIC) 15 MG tablet, Take 15 mg by mouth daily., Disp: , Rfl:    mometasone (NASONEX) 50 MCG/ACT nasal spray, PLACE 2 SPRAYS INTO THE NOSE DAILY.,  Disp: 51 each, Rfl: 1   vitamin E 400 UNIT capsule, Take 400 Units by mouth daily. Reported on 10/09/2015, Disp: , Rfl:   EXAM:  GENERAL: alert, oriented, appears well and in no acute distress  HEENT: atraumatic, conjunttiva clear, no obvious abnormalities on inspection of external nose and ears  NECK: normal movements of the head and neck  LUNGS: on inspection no signs of respiratory distress, breathing rate appears normal, no obvious gross SOB, gasping or wheezing  CV: no obvious cyanosis  PSYCH/NEURO: pleasant and cooperative, no obvious depression or anxiety, speech and thought processing grossly intact  ASSESSMENT AND PLAN:  Discussed the following assessment and plan:  Problem List Items Addressed This Visit     Abnormal MRI of  abdomen - Primary   Pain - recent CT as outlined. MRI - Thinly septated fluid signal cystic lesion arising from the tip of the pancreatic tail measuring 2.0 x 2.0 cm. No solid component or suspicious contrast enhancement. No pancreatic ductal dilatation or surrounding inflammatory changes. This is most consistent with a benign side branch IPMN or pseudocyst and is new compared to remote prior examination dated 2014. Given size and some internal complexity, recommend follow-up MRI in 1 year to ensure stability. Discussed above. Keep f/u with GI for further evaluation and w/up.  Currently symptoms stable.       GERD (gastroesophageal reflux disease)   EGD 01/28/22 - Dr Threasa Alpha Z-line regular, 37 cm from the incisors. - 1 cm hiatal hernia. - Normal esophagus otherwise - empiric dilation performed to 18mm as above - Normal stomach. - Focal area of nodular / polypoid mucosa near the ampulla. Biopsied to ensure no adenomatous change. - Normal duodenum otherwise Continue pepcid.  Given CT findings, planning to f/u with GI - question of need for EGD.       Leukopenia   Has been worked up by hematology.  Felt related to PPI.  Follow cbc.          Return if symptoms worsen or fail to improve.   I discussed the assessment and treatment plan with the patient. The patient was provided an opportunity to ask questions and all were answered. The patient agreed with the plan and demonstrated an understanding of the instructions.   The patient was advised to call back or seek an in-person evaluation if the symptoms worsen or if the condition fails to improve as anticipated.    Dale Jay, MD

## 2023-04-24 ENCOUNTER — Encounter: Payer: Self-pay | Admitting: Internal Medicine

## 2023-04-24 NOTE — Assessment & Plan Note (Signed)
Has been worked up by hematology.  Felt related to PPI.  Follow cbc.

## 2023-04-24 NOTE — Assessment & Plan Note (Signed)
Pain - recent CT as outlined. MRI - Thinly septated fluid signal cystic lesion arising from the tip of the pancreatic tail measuring 2.0 x 2.0 cm. No solid component or suspicious contrast enhancement. No pancreatic ductal dilatation or surrounding inflammatory changes. This is most consistent with a benign side branch IPMN or pseudocyst and is new compared to remote prior examination dated 2014. Given size and some internal complexity, recommend follow-up MRI in 1 year to ensure stability. Discussed above. Keep f/u with GI for further evaluation and w/up.  Currently symptoms stable.

## 2023-04-24 NOTE — Assessment & Plan Note (Addendum)
EGD 01/28/22 - Dr Threasa Alpha Z-line regular, 37 cm from the incisors. - 1 cm hiatal hernia. - Normal esophagus otherwise - empiric dilation performed to 18mm as above - Normal stomach. - Focal area of nodular / polypoid mucosa near the ampulla. Biopsied to ensure no adenomatous change. - Normal duodenum otherwise Continue pepcid.  Given CT findings, planning to f/u with GI - question of need for EGD.

## 2023-04-27 ENCOUNTER — Other Ambulatory Visit: Payer: Self-pay | Admitting: Nurse Practitioner

## 2023-05-06 ENCOUNTER — Ambulatory Visit: Payer: 59 | Admitting: Nurse Practitioner

## 2023-05-06 ENCOUNTER — Encounter: Payer: Self-pay | Admitting: Nurse Practitioner

## 2023-05-06 VITALS — BP 132/80 | HR 71 | Ht 65.5 in | Wt 185.0 lb

## 2023-05-06 DIAGNOSIS — Z860100 Personal history of colon polyps, unspecified: Secondary | ICD-10-CM

## 2023-05-06 DIAGNOSIS — K862 Cyst of pancreas: Secondary | ICD-10-CM

## 2023-05-06 DIAGNOSIS — R1010 Upper abdominal pain, unspecified: Secondary | ICD-10-CM

## 2023-05-06 MED ORDER — DICYCLOMINE HCL 10 MG PO CAPS: 10.0000 mg | ORAL_CAPSULE | Freq: Three times a day (TID) | ORAL | 0 refills | Status: AC | PRN

## 2023-05-06 NOTE — Progress Notes (Unsigned)
05/06/2023 Kelly Massey [id number] 01-Jan-1966   Chief Complaint:  History of Present Illness: Kelly Massey is a 58 year old female with a past medical history of arthritis, asthma, hyperlipidemia, chronic leukopenia, iron deficiency anemia, dysphagia, GERD and colon polyps.   She had upper abdominal pain 1 month, something catching when bending, hard to breath. PCP ordered CTAP. Small pancreatic cyst. MRI  Pain in stomach before she eats breakfast  Hurts like crazy. Feels like a stomach ache.  Yesterday started sweating with pain. She vomited a little breakfast and passed diarrhea.  No blood.  Today solid stool at home, here stool loose.  Pain too bad to eat Skips breakfast then eats lunch 11:30 12pm.   2 episodes this week, last week once or twice weekly. No specific food triggers. Occurs out of the blue.   No NSAIDs.    No fevers. She has some night sweats for a few years. Once or twice weekly. Hysterectomy. Hot flashes during day.   Morning throat feels tight Phlegm sits in throat  No dysphagia  She has heartburn if she eats past 6pm,  Sometimes night reflux  Acid reflux once weekly   Meloxicam as needed, none for past week arm spasms   On ferrous sulfate, oral iron years      Latest Ref Rng & Units 02/28/2023    9:53 AM 12/10/2022    7:50 AM 06/23/2022    7:32 AM  CBC  WBC 4.0 - 10.5 K/uL 2.5  3.2  3.0   Hemoglobin 12.0 - 15.0 g/dL 78.2  95.6  21.3   Hematocrit 36.0 - 46.0 % 42.4  42.6  43.3   Platelets 150.0 - 400.0 K/uL 206.0  209.0  185.0        Latest Ref Rng & Units 02/28/2023    9:53 AM 12/10/2022    7:50 AM 06/23/2022    7:32 AM  CMP  Glucose 70 - 99 mg/dL 91   086   BUN 6 - 23 mg/dL 8   9   Creatinine 5.78 - 1.20 mg/dL 4.69   6.29   Sodium 528 - 145 mEq/L 140   141   Potassium 3.5 - 5.1 mEq/L 4.5   4.7   Chloride 96 - 112 mEq/L 103   104   CO2 19 - 32 mEq/L 31   30   Calcium 8.4 - 10.5 mg/dL 9.3   9.5   Total Protein 6.0 - 8.3 g/dL 6.7   7.0  7.1   Total Bilirubin 0.2 - 1.2 mg/dL 0.6  0.6  0.5   Alkaline Phos 39 - 117 U/L 59  60  61   AST 0 - 37 U/L 15  18  17    ALT 0 - 35 U/L 13  15  14      CTAP with contrast 03/18/2023: FINDINGS: Lower chest: Bibasilar atelectasis/scarring.   Hepatobiliary: No suspicious hepatic lesion. Gallbladder is unremarkable. No biliary ductal dilation.   Pancreas: No pancreatic ductal dilation or evidence of acute inflammation. 15 mm cyst in the tail of the pancreas on image 21/2.   Spleen: No splenomegaly or focal splenic lesion.   Adrenals/Urinary Tract: Bilateral adrenal glands appear normal. No hydronephrosis. Kidneys demonstrate symmetric enhancement. Urinary bladder is unremarkable for degree of distension.   Stomach/Bowel: Wall thickening versus underdistention of the gastric antrum. No pathologic dilation of small or large bowel. Sigmoid colonic diverticulosis without findings of acute diverticulitis.   Vascular/Lymphatic: Normal caliber abdominal aorta.  Smooth IVC contours. The portal, splenic and superior mesenteric veins are patent.   Reproductive: Status post hysterectomy. No adnexal masses.   Other: No significant abdominopelvic free fluid.   Musculoskeletal: No acute osseous abnormality   IMPRESSION: 1. Wall thickening versus underdistention of the gastric antrum, correlate with symptoms of gastritis and consider further evaluation with endoscopy. 2. Sigmoid colonic diverticulosis without findings of acute diverticulitis. 3. 15 mm cyst in the tail of the pancreas, likely a side branch IPMN. Recommend follow up pre and post contrast MRI/MRCP.    Abdominal MRI/MRCP with and without contrast 04/05/2023:  FINDINGS: Lower chest: No acute abnormality.   Hepatobiliary: No solid liver abnormality is seen. No gallstones, gallbladder wall thickening, or biliary dilatation.   Pancreas: Thinly septated fluid signal cystic lesion arising from the tip of the pancreatic  tail measuring 2.0 x 2.0 cm (series 4, image 20). No solid component or suspicious contrast enhancement. No pancreatic ductal dilatation or surrounding inflammatory changes.   Spleen: Normal in size without significant abnormality.   Adrenals/Urinary Tract: Adrenal glands are unremarkable. Kidneys are normal, without renal calculi, solid lesion, or hydronephrosis.   Stomach/Bowel: Stomach is within normal limits. No evidence of bowel wall thickening, distention, or inflammatory changes.   Vascular/Lymphatic: No significant vascular findings are present. No enlarged abdominal lymph nodes.   Other: No abdominal wall hernia or abnormality. No ascites.   Musculoskeletal: No acute or significant osseous findings.   IMPRESSION: Thinly septated fluid signal cystic lesion arising from the tip of the pancreatic tail measuring 2.0 x 2.0 cm. No solid component or suspicious contrast enhancement. No pancreatic ductal dilatation or surrounding inflammatory changes. This is most consistent with a benign side branch IPMN ( intraductal papillary mucinous neoplasm (IPMN) or pseudocyst and is new compared to remote prior examination dated 2014. Given size and some internal complexity, recommend follow-up MRI in 1 year to ensure stability.  EGD 01/28/2022: - Z-line regular, 37 cm from the incisors.  - 1 cm hiatal hernia.  - Normal esophagus otherwise  - empiric dilation performed to 18mm as above  - Normal stomach.  - Focal area of nodular / polypoid mucosa near the ampulla. Biopsied to ensure no adenomatous change.  - Normal duodenum otherwise  FRAGMENTS OF NORMAL DUODENAL MUCOSA. THERE ARE NO DIAGNOSTIC FEATURES OF CELIAC DISEASE. NEGATIVE FOR DYSPLASIA AND MALIGNANCY.  Colonoscopy 01/05/2021: - Tortuous colon. - One 3 mm polyp in the ascending colon, removed with a cold snare. Resected and retrieved. - Two 3 mm polyps at the recto-sigmoid colon, removed with a cold snare. Resected  andretrieved. - Diverticulosis in the sigmoid colon. - The examination was otherwise normal. - 7 year colonoscopy recall    1. Surgical [P], colon, rectosigmoid x1, polyp (1) - HYPERPLASTIC POLYP 2. Surgical [P], colon, ascending x1, polyp (1) - TUBULAR ADENOMA - NEGATIVE FOR HIGH-GRADE DYSPLASIA OR MALIGNANCY   PH impedence study 07/25/2019: 1.  Normal esophageal acid exposure with % time < 4 of 0.5% 2.  Esophageal acid exposure is normal in upright and supine position 3.  Overall reflux events within normal limits 4   Positive symptom correlation for reflux events associated with belching based on symptom association probability Impression No evidence of significant pathogenic gastroesophageal acid reflux Very few reflux episodes were noted and were predominantly triggered by excessive belching   EGD 04/02/2019: - Esophagogastric landmarks identified. - 1-2 cm sliding hiatal hernia. - Normal esophagus otherwise - empiric dilation performed to 18mm - Normal stomach. Hill grade III  views of the cardia. - Normal duodenal bulb and second portion of the duodenum.   Barium swallow study 10/13/2015: Small reducible hiatal hernia with small amount of gastroesophageal reflux. There is no evidence of a stricture nor of esophagitis.   EGD 01/21/2015: Normal appearing esophagus. Biopsies obtained to rule out EoE. Empiric dilation performed with 18mm TTS balloon given prior positive response to dilation, in which no resistance was noted and no mucosal wrent appreciated Benign appearing small gastric polyp, removed Normal remainder of examined stomach, biopsies obtained to rule out H pylori. Normal duodenum   EGD 07/24/2012: GERD Esophageal dilatation   EGD 10/12/2010: -Normal stomach -Normal duodenum   Colonoscopy 10/12/2010: 4 polyps removed from the recto sigmoid and rectum    Part A: GEJ BIOPSY:  - SQUAMOCOLUMNAR MUCOSA WITH REFLUX GASTROESOPHAGITIS.  - NEGATIVE FOR GOBLET  CELLS, DYSPLASIA AND MALIGNANCY.  .  Part B: RECTO-SIGMOID JUNCTION POLYP X 2 COLD BIOPSY:  - HYPERPLASTIC POLYP (2).  - NEGATIVE FOR DYSPLASIA AND MALIGNANCY.  .  Part C: RECTAL VAULT POLYPS X 2 COLD BIOPSY:  - HYPERPLASTIC POLYP (4).  - NEGATIVE FOR DYSPLASIA AND MALIGNANCY    Current Medications, Allergies, Past Medical History, Past Surgical History, Family History and Social History were reviewed in [employer] record.   Review of Systems:   Constitutional: Negative for fever, sweats, chills or weight loss.  Respiratory: Negative for shortness of breath.   Cardiovascular: Negative for chest pain, palpitations and leg swelling.  Gastrointestinal: See HPI.  Musculoskeletal: Negative for back pain or muscle aches.  Neurological: Negative for dizziness, headaches or paresthesias.    Physical Exam: LMP 10/06/2011  General: in no acute distress. Head: Normocephalic and atraumatic. Eyes: No scleral icterus. Conjunctiva pink . Ears: Normal auditory acuity. Mouth: Dentition intact. No ulcers or lesions.  Lungs: Clear throughout to auscultation. Heart: Regular rate and rhythm, no murmur. Abdomen: Soft, nontender and nondistended. No masses or hepatomegaly. Normal bowel sounds x 4 quadrants.  Rectal: Deferred.  Musculoskeletal: Symmetrical with no gross deformities. Extremities: No edema. Neurological: Alert oriented x 4. No focal deficits.  Psychological: Alert and cooperative. Normal mood and affect  Assessment and Recommendations:   History of colon polyps.  Colonoscopy 01/05/2021 identified 3 tubular adenomatous/hyperplastic polyps removed from the a sending and rectosigmoid colon.  -Next colonoscopy due 01/2028

## 2023-05-06 NOTE — Patient Instructions (Addendum)
You have been scheduled for an endoscopy. Please follow written instructions given to you at your visit today.  If you use inhalers (even only as needed), please bring them with you on the day of your procedure. ______________________________________ We have sent the following medications to your pharmacy for you to pick up at your convenience: Dicyclomine 10 mg  Due to recent changes in healthcare laws, you may see the results of your imaging and laboratory studies on MyChart before your provider has had a chance to review them.  We understand that in some cases there may be results that are confusing or concerning to you. Not all laboratory results come back in the same time frame and the provider may be waiting for multiple results in order to interpret others.  Please give [place] 48 hours in order for your provider to thoroughly review all the results before contacting the office for clarification of your results.   Thank you for trusting me with your gastrointestinal care!   Alcide Evener, CRNP

## 2023-05-08 ENCOUNTER — Encounter: Payer: Self-pay | Admitting: Nurse Practitioner

## 2023-05-09 NOTE — Progress Notes (Signed)
Agree with assessment and plan as outlined.  Given characteristics of pancreatic cyst noted, I think MRCP is reasonable to be done in one year for surveillance of pancreatic cyst. I do not see any high risk features noted.. If she is anxious about this can see her in the office in 6 months to discuss further. Unclear if insurance would support imaging her pancreas sooner, but could do so if she is anxious about this and could get it approved.

## 2023-05-09 NOTE — Progress Notes (Signed)
Kelly Massey, please contact patient and let her know Dr. Adela Lank reviewed her abdominal MRI and he assessed that it was reasonable to repeat an abdominal MRI/MRCP in 1 year to reassess the pancreatic cyst.  However, she may schedule a follow-up appointment with Dr. Adela Lank in 6 months to further discuss her concerns and to consider earlier imaging.  Thank you.  Please enter office appointment recall 6 months Please enter abdominal MRI/MRCP recall 1 year if not already entered.  Thank you

## 2023-05-10 ENCOUNTER — Encounter: Payer: Self-pay | Admitting: Gastroenterology

## 2023-05-10 ENCOUNTER — Ambulatory Visit: Payer: 59 | Admitting: Gastroenterology

## 2023-05-10 VITALS — BP 122/71 | HR 70 | Temp 98.2°F | Resp 13 | Ht 65.0 in | Wt 185.0 lb

## 2023-05-10 DIAGNOSIS — K2950 Unspecified chronic gastritis without bleeding: Secondary | ICD-10-CM

## 2023-05-10 DIAGNOSIS — R933 Abnormal findings on diagnostic imaging of other parts of digestive tract: Secondary | ICD-10-CM

## 2023-05-10 DIAGNOSIS — K449 Diaphragmatic hernia without obstruction or gangrene: Secondary | ICD-10-CM

## 2023-05-10 DIAGNOSIS — R1010 Upper abdominal pain, unspecified: Secondary | ICD-10-CM

## 2023-05-10 MED ORDER — SODIUM CHLORIDE 0.9 % IV SOLN: 500.0000 mL | Freq: Once | INTRAVENOUS | Status: DC

## 2023-05-10 NOTE — Progress Notes (Signed)
 Sedate, gd SR, tolerated procedure well, VSS, report to RN

## 2023-05-10 NOTE — Progress Notes (Signed)
 Called to room to assist during endoscopic procedure.  Patient ID and intended procedure confirmed with present staff. Received instructions for my participation in the procedure from the performing physician.

## 2023-05-10 NOTE — Progress Notes (Signed)
 History and Physical Interval Note: Seen recently in the office - no interval changes. Here for EGD to evaluate intermittent epigastric pain. CT scan showed some gastric thickening and a pancreatic cyst. On pepcid , has had intolerance / failure to some PPIs in the past. EGD to further evaluate, no interval changes in her history.  05/10/2023 11:01 AM  Kelly Massey  has presented today for endoscopic procedure(s), with the diagnosis of  Encounter Diagnoses  Name Primary?   Upper abdominal pain Yes   Abnormal CT scan, gastrointestinal tract   .  The various methods of evaluation and treatment have been discussed with the patient and/or family. After consideration of risks, benefits and other options for treatment, the patient has consented to  the endoscopic procedure(s).   The patient's history has been reviewed, patient examined, no change in status, stable for surgery.  I have reviewed the patient's chart and labs.  Questions were answered to the patient's satisfaction.    Marcey Naval, MD [facility]

## 2023-05-10 NOTE — Patient Instructions (Signed)
  Resume previous diet  Continue present medications  Awaiting pathology results   YOU HAD AN ENDOSCOPIC PROCEDURE TODAY AT THE [facility]:   Refer to the procedure report that was given to you for any specific questions about what was found during the examination.  If the procedure report does not answer your questions, please call your gastroenterologist to clarify.  If you requested that your care partner not be given the details of your procedure findings, then the procedure report has been included in a sealed envelope for you to review at your convenience later.  YOU SHOULD EXPECT: Some feelings of bloating in the abdomen. Passage of more gas than usual.  Walking can help get rid of the air that was put into your GI tract during the procedure and reduce the bloating. If you had a lower endoscopy (such as a colonoscopy or flexible sigmoidoscopy) you may notice spotting of blood in your stool or on the toilet paper. If you underwent a bowel prep for your procedure, you may not have a normal bowel movement for a few days.  Please Note:  You might notice some irritation and congestion in your nose or some drainage.  This is from the oxygen used during your procedure.  There is no need for concern and it should clear up in a day or so.  SYMPTOMS TO REPORT IMMEDIATELY:  Following upper endoscopy (EGD)  Vomiting of blood or coffee ground material  New chest pain or pain under the shoulder blades  Painful or persistently difficult swallowing  New shortness of breath  Fever of 100F or higher  Black, tarry-looking stools  For urgent or emergent issues, a gastroenterologist can be reached at any hour by calling [phone]. Do not use MyChart messaging for urgent concerns.    DIET:  We do recommend a small meal at first, but then you may proceed to your regular diet.  Drink plenty of fluids but you should avoid alcoholic beverages for 24 hours.  ACTIVITY:  You should plan to  take it easy for the rest of today and you should NOT DRIVE or use heavy machinery until tomorrow (because of the sedation medicines used during the test).    FOLLOW UP: Our staff will call the number listed on your records the next business day following your procedure.  We will call around 7:15- 8:00 am to check on you and address any questions or concerns that you may have regarding the information given to you following your procedure. If we do not reach you, we will leave a message.     If any biopsies were taken you will be contacted by phone or by letter within the next 1-3 weeks.  Please call us at [phone] if you have not heard about the biopsies in 3 weeks.    SIGNATURES/CONFIDENTIALITY: You and/or your care partner have signed paperwork which will be entered into your electronic medical record.  These signatures attest to the fact that that the information above on your After Visit Summary has been reviewed and is understood.  Full responsibility of the confidentiality of this discharge information lies with you and/or your care-partner.

## 2023-05-10 NOTE — Op Note (Signed)
 [facility] Patient Name: Kelly Massey Procedure Date: 05/10/2023 11:01 AM MRN: [id number] Endoscopist: Kelly P. Leigh , MD, [id number] Age: 58 Referring MD:  Date of Birth: Feb 25, 1966 Gender: Female Account #: [id number] Procedure:                Upper GI endoscopy Indications:              Epigastric abdominal pain, Abnormal CT of the GI                            tract (CT shows gastric thickening vs.                            underdistension) - has had leukopenia in the past                            on high dose PPI with variable response (numerous                            if not all PPIs tried). on pepcid  twice daily                            currently Medicines:                Monitored Anesthesia Care Procedure:                Pre-Anesthesia Assessment:                           - Prior to the procedure, a History and Physical                            was performed, and patient medications and                            allergies were reviewed. The patient's tolerance of                            previous anesthesia was also reviewed. The risks                            and benefits of the procedure and the sedation                            options and risks were discussed with the patient.                            All questions were answered, and informed consent                            was obtained. Prior Anticoagulants: The patient has                            taken no anticoagulant or antiplatelet agents. ASA  Grade Assessment: II - A patient with mild systemic                            disease. After reviewing the risks and benefits,                            the patient was deemed in satisfactory condition to                            undergo the procedure.                           After obtaining informed consent, the endoscope was                            passed under direct vision. Throughout the                             procedure, the patient's blood pressure, pulse, and                            oxygen saturations were monitored continuously. The                            Olympus Scope SN [id number] was introduced through the                            mouth, and advanced to the second part of duodenum.                            The upper GI endoscopy was accomplished without                            difficulty. The patient tolerated the procedure                            well. Scope In: Scope Out: Findings:                 Esophagogastric landmarks were identified: the                            Z-line was found at 36 cm, the gastroesophageal                            junction was found at 36 cm and the upper extent of                            the gastric folds was found at 38 cm from the                            incisors.                           A 2  cm hiatal hernia was present.                           The exam of the esophagus was otherwise normal.                           Patchy inflammation characterized by erythema and                            granularity was found in the gastric antrum.                           The exam of the stomach was otherwise normal.                           Biopsies were taken with a cold forceps for                            Helicobacter pylori testing.                           The examined duodenum was normal. Complications:            No immediate complications. Estimated blood loss:                            Minimal. Estimated Blood Loss:     Estimated blood loss was minimal. Impression:               - Esophagogastric landmarks identified.                           - 2 cm hiatal hernia.                           - Normal esophagus otherwise.                           - Antral gastritis.                           - Normal stomach otherwise - biopsies taken to rule                            out H pylori.                            - Normal examined duodenum.                           Patient would benefit from stronger antacid, I                            think, but has had leukopenia and variable response                            to PPI in the past. Will see if  we can get her a                            free sample of Voquezna 20mg  to take for a few                            weeks. If it helps, will send a prescription for it. Recommendation:           - Patient has a contact number available for                            emergencies. The signs and symptoms of potential                            delayed complications were discussed with the                            patient. Return to normal activities tomorrow.                            Written discharge instructions were provided to the                            patient.                           - Resume previous diet.                           - Continue present medications.                           - Trial of Voquezna 20mg  / day (will see if we can                            get free samples from the office)                           - Await pathology results. Kelly P. Jasara Corrigan, MD 05/10/2023 11:21:00 AM This report has been signed electronically.

## 2023-05-11 ENCOUNTER — Telehealth: Payer: Self-pay

## 2023-05-11 ENCOUNTER — Telehealth: Payer: Self-pay | Admitting: *Deleted

## 2023-05-11 NOTE — Telephone Encounter (Signed)
 Pt made aware of Dr. General Kenner recommendations. Reminder placed in epic for 6 months and 1 year. Pt made aware. Pt verbalized understanding with all questions answered.

## 2023-05-11 NOTE — Telephone Encounter (Signed)
  Follow up Call-     05/10/2023   10:24 AM 01/28/2022    9:35 AM 01/05/2021    2:04 PM  Call back number  Post procedure Call Back phone  # [phone]  Permission to leave phone message Yes Yes Yes     Patient questions:  Do you have a fever, pain , or abdominal swelling? No. Pain Score  0 *  Have you tolerated food without any problems? Yes.    Have you been able to return to your normal activities? Yes.    Do you have any questions about your discharge instructions: Diet   No. Medications  No. Follow up visit  No.  Do you have questions or concerns about your Care? No.  Actions: * If pain score is 4 or above: No action needed, pain <4.

## 2023-05-11 NOTE — Telephone Encounter (Signed)
 Message Received: 2 days ago Cara Elida HERO, NP  Wonda Standing, RN      Previous Messages  Routed Note  Author: Cara Elida HERO, NP Service: Gastroenterology Author Type: Nurse Practitioner  Filed: 05/09/2023  1:38 PM Encounter Date: 05/06/2023 Status: Signed  Editor: Cara Elida HERO, NP (Nurse Practitioner)   Standing, please contact patient and let her know Dr. Leigh reviewed her abdominal MRI and he assessed that it was reasonable to repeat an abdominal MRI/MRCP in 1 year to reassess the pancreatic cyst.  However, she may schedule a follow-up appointment with Dr. Leigh in 6 months to further discuss her concerns and to consider earlier imaging.  Thank you.   Please enter office appointment recall 6 months Please enter abdominal MRI/MRCP recall 1 year if not already entered.  Thank you

## 2023-05-12 ENCOUNTER — Encounter: Payer: Self-pay | Admitting: Gastroenterology

## 2023-05-12 ENCOUNTER — Other Ambulatory Visit: Payer: Self-pay

## 2023-05-12 LAB — SURGICAL PATHOLOGY

## 2023-05-12 NOTE — Telephone Encounter (Signed)
 Called patient to confirm she was able to get the samples and she confirmed she had them.  She was reminded to take them once daily

## 2023-05-23 ENCOUNTER — Encounter: Payer: 59 | Admitting: Internal Medicine

## 2023-06-13 ENCOUNTER — Ambulatory Visit (INDEPENDENT_AMBULATORY_CARE_PROVIDER_SITE_OTHER): Payer: 59 | Admitting: Internal Medicine

## 2023-06-13 DIAGNOSIS — D72819 Decreased white blood cell count, unspecified: Secondary | ICD-10-CM

## 2023-06-13 DIAGNOSIS — R739 Hyperglycemia, unspecified: Secondary | ICD-10-CM

## 2023-06-13 DIAGNOSIS — Z124 Encounter for screening for malignant neoplasm of cervix: Secondary | ICD-10-CM

## 2023-06-13 DIAGNOSIS — E7800 Pure hypercholesterolemia, unspecified: Secondary | ICD-10-CM

## 2023-06-13 DIAGNOSIS — Z0000 Encounter for general adult medical examination without abnormal findings: Secondary | ICD-10-CM

## 2023-06-13 NOTE — Assessment & Plan Note (Signed)
 Physical today 06/13/23. Gets breasts, pelvic and pap smears through gyn.  Breat exam today. Mammogram 11/03/22 - Birads I.  Followed by GI.  Colonoscopy 01/05/21 (three polyps removed and diverticulosis).

## 2023-06-13 NOTE — Progress Notes (Deleted)
 Subjective:    Patient ID: Kelly Massey, female    DOB: [birth year], 58 y.o.   MRN: [id number]  Patient here for No chief complaint on file.   HPI Here for a physical exam.    Past Medical History:  Diagnosis Date   Allergy    Anemia    Arthritis    knees - no meds   Colon polyps    HPP_ 2012   GERD (gastroesophageal reflux disease) 10/12/2010   EGD, positive H. pylori   HSV infection    History   Post-operative nausea and vomiting    Seasonal allergies    Past Surgical History:  Procedure Laterality Date   87 HOUR PH STUDY N/A 07/25/2019   Procedure: 24 HOUR PH STUDY;  Surgeon: Shellia Cleverly, DO;  Location: [facility];  Service: Gastroenterology;  Laterality: N/A;   ABDOMINAL HYSTERECTOMY     BALLOON DILATION N/A 07/24/2012   Procedure: BALLOON DILATION;  Surgeon: Louis Meckel, MD;  Location: [facility];  Service: Endoscopy;  Laterality: N/A;   BRAVO PH STUDY N/A 07/24/2012   Procedure: BRAVO PH STUDY;  Surgeon: Louis Meckel, MD;  Location: [facility];  Service: Endoscopy;  Laterality: N/A;   COLONOSCOPY     DILATION AND CURETTAGE OF UTERUS     SAB   ESOPHAGEAL MANOMETRY N/A 07/25/2019   Procedure: ESOPHAGEAL MANOMETRY (EM);  Surgeon: Shellia Cleverly, DO;  Location: [facility];  Service: Gastroenterology;  Laterality: N/A;   ESOPHAGOGASTRODUODENOSCOPY N/A 07/24/2012   Procedure: ESOPHAGOGASTRODUODENOSCOPY (EGD);  Surgeon: Louis Meckel, MD;  Location: [facility];  Service: Endoscopy;  Laterality: N/A;   OVARIAN CYST REMOVAL  2004   laparotomy -left   ROBOTIC ASSISTED LAPAROSCOPIC LYSIS OF ADHESION N/A 03/21/2014   Procedure: ROBOTIC ASSISTED LAPAROSCOPIC EXTENSIVE LYSIS OF ADHESION (1 Hour);  Surgeon: Serita Kyle, MD;  Location: [facility];  Service: Gynecology;  Laterality: N/A;   ROBOTIC ASSISTED SALPINGO OOPHERECTOMY Left 03/21/2014   Procedure:  ROBOTIC ASSISTED LEFT OOPHORECTOMY;  Surgeon: Serita Kyle, MD;  Location: [facility];  Service: Gynecology;  Laterality: Left;   rotator cuff surgery  2017   SHOULDER ARTHROSCOPY Left    TENNIS ELBOW RELEASE/NIRSCHEL PROCEDURE Right 05/01/2015   Procedure: RIGHT ELBOW DEBRIDEMENT AND TENDON REPAIR;  Surgeon: Loreta Ave, MD;  Location: [facility];  Service: Orthopedics;  Laterality: Right;   UPPER GASTROINTESTINAL ENDOSCOPY     WISDOM TOOTH EXTRACTION     Family History  Problem Relation Age of Onset   Arthritis Mother    Stroke Mother    Hypertension Mother    Heart failure Mother    Dementia Mother    Lung cancer Father    Diabetes Sister    Diabetes Brother        x 2   Breast cancer Maternal Aunt    Throat cancer Maternal Aunt        Smoker   Esophageal cancer Maternal Aunt    Prostate cancer Maternal Uncle        x 2   Diabetes Maternal Uncle    Diabetes Paternal Grandmother    Diabetes Paternal Grandfather    Hypertension Other    Hyperlipidemia Other    Colon cancer Neg Hx    Stomach cancer Neg Hx    Rectal cancer Neg Hx    Colon polyps Neg Hx    Social History   Socioeconomic History   Marital status: Married  Spouse name: Not on file   Number of children: 0   Years of education: Not on file   Highest education level: Some college, no degree  Occupational History   Occupation: RESEARCH    Employer: [employer]  Tobacco Use   Smoking status: Never   Smokeless tobacco: Never  Vaping Use   Vaping status: Never Used  Substance and Sexual Activity   Alcohol use: Not Currently    Comment: occassional   Drug use: No   Sexual activity: Yes    Birth control/protection: Kelly Massey, Surgical    Comment: hysterectomy  Other Topics Concern   Not on file  Social History Narrative   Teaching laboratory technician    Social Drivers of Health   Financial Resource Strain: Low Risk  (02/27/2023)   Overall Financial Resource Strain (CARDIA)    Difficulty of Paying Living Expenses: Not hard at all  Food Insecurity: No Food Insecurity  (02/27/2023)   Hunger Vital Sign    Worried About Running Out of Food in the Last Year: Never true    Ran Out of Food in the Last Year: Never true  Transportation Needs: No Transportation Needs (02/27/2023)   PRAPARE - Administrator, Civil Service (Medical): No    Lack of Transportation (Non-Medical): No  Physical Activity: Insufficiently Active (02/27/2023)   Exercise Vital Sign    Days of Exercise per Week: 1 day    Minutes of Exercise per Session: 30 min  Stress: No Stress Concern Present (02/27/2023)   [employer] of Occupational Health - Occupational Stress Questionnaire    Feeling of Stress : Not at all  Social Connections: Socially Integrated (02/27/2023)   Social Connection and Isolation Panel [NHANES]    Frequency of Communication with Friends and Family: More than three times a week    Frequency of Social Gatherings with Friends and Family: Twice a week    Attends Religious Services: More than 4 times per year    Active Member of [employer] or Organizations: Yes    Attends Banker Meetings: More than 4 times per year    Marital Status: Married     Review of Systems     Objective:     LMP 10/06/2011  Wt Readings from Last 3 Encounters:  05/10/23 185 lb (83.9 kg)  05/06/23 185 lb (83.9 kg)  04/21/23 197 lb (89.4 kg)    Physical Exam  {Perform Simple Foot Exam  Perform Detailed exam:1} {Insert foot Exam (Optional):30965}   Outpatient Encounter Medications as of 06/13/2023  Medication Sig   acyclovir (ZOVIRAX) 400 MG tablet TAKE 1 TABLET BY MOUTH 2 TIMES DAILY AS NEEDED.   azelastine (ASTELIN) 0.1 % nasal spray Place 1 spray into both nostrils 2 (two) times daily. Use in each nostril as directed   Calcium Carbonate-Vitamin D 600-400 MG-UNIT tablet Take 1 tablet by mouth daily.   dicyclomine (BENTYL) 10 MG capsule Take 1 capsule (10 mg total) by mouth every 8 (eight) hours as needed.   diphenhydrAMINE (BENADRYL) 12.5 MG/5ML elixir Take  by mouth as needed.   famotidine (PEPCID) 40 MG tablet TAKE 1 TABLET BY MOUTH TWICE A DAY   ferrous sulfate 325 (65 FE) MG tablet Take 325 mg by mouth daily.    fluticasone (FLONASE) 50 MCG/ACT nasal spray Place 2 sprays into both nostrils daily. To use in place of your Nasonex   levocetirizine (XYZAL) 5 MG tablet TAKE 1 TABLET (5 MG TOTAL) BY MOUTH DAILY.   meloxicam (  MOBIC) 15 MG tablet Take 15 mg by mouth daily.   mometasone (NASONEX) 50 MCG/ACT nasal spray PLACE 2 SPRAYS INTO THE NOSE DAILY. (Patient not taking: Reported on 05/10/2023)   vitamin E 400 UNIT capsule Take 400 Units by mouth daily. Reported on 10/09/2015   Vonoprazan Fumarate 20 MG TABS Take 20 mg by mouth daily.   No facility-administered encounter medications on file as of 06/13/2023.     Lab Results  Component Value Date   WBC 2.5 (L) 02/28/2023   HGB 14.0 02/28/2023   HCT 42.4 02/28/2023   PLT 206.0 02/28/2023   GLUCOSE 91 02/28/2023   CHOL 222 (H) 12/10/2022   TRIG 68.0 12/10/2022   HDL 93.90 12/10/2022   LDLCALC 114 (H) 12/10/2022   ALT 13 02/28/2023   AST 15 02/28/2023   NA 140 02/28/2023   K 4.5 02/28/2023   CL 103 02/28/2023   CREATININE 0.80 02/28/2023   BUN 8 02/28/2023   CO2 31 02/28/2023   TSH 1.36 12/10/2022   HGBA1C 6.1 12/10/2022    MR 3D Recon At Scanner Result Date: 04/16/2023 : CLINICAL DATA: Pancreatic cyst EXAM: MRI ABDOMEN WITHOUT AND WITH CONTRAST (INCLUDING MRCP) TECHNIQUE: Multiplanar multisequence MR imaging of the abdomen was performed both before and after the administration of intravenous contrast. Heavily T2-weighted images of the biliary and pancreatic ducts were obtained, and three-dimensional MRCP images were rendered by post processing. CONTRAST: 8mL GADAVIST GADOBUTROL 1 MMOL/ML IV SOLN COMPARISON: CT abdomen pelvis, 03/18/2023 FINDINGS: Lower chest: No acute abnormality. Hepatobiliary: No solid liver abnormality is seen. No gallstones, gallbladder wall thickening, or biliary  dilatation. Pancreas: Thinly septated fluid signal cystic lesion arising from the tip of the pancreatic tail measuring 2.0 x 2.0 cm (series 4, image 20). No solid component or suspicious contrast enhancement. No pancreatic ductal dilatation or surrounding inflammatory changes. Spleen: Normal in size without significant abnormality. Adrenals/Urinary Tract: Adrenal glands are unremarkable. Kidneys are normal, without renal calculi, solid lesion, or hydronephrosis. Stomach/Bowel: Stomach is within normal limits. No evidence of bowel wall thickening, distention, or inflammatory changes. Vascular/Lymphatic: No significant vascular findings are present. No enlarged abdominal lymph nodes. Other: No abdominal wall hernia or abnormality. No ascites. Musculoskeletal: No acute or significant osseous findings. IMPRESSION: Thinly septated fluid signal cystic lesion arising from the tip of the pancreatic tail measuring 2.0 x 2.0 cm. No solid component or suspicious contrast enhancement. No pancreatic ductal dilatation or surrounding inflammatory changes. This is most consistent with a benign side branch IPMN or pseudocyst and is new compared to remote prior examination dated 2014. Given size and some internal complexity, recommend follow-up MRI in 1 year to ensure stability. Electronically Signed By: Jearld Lesch M.D. On: 04/06/2023 23:32 Electronically Signed   By: Jearld Lesch M.D.   On: 04/16/2023 15:03   MR ABDOMEN MRCP W WO CONTAST Result Date: 04/06/2023 CLINICAL DATA:  Pancreatic cyst EXAM: MRI ABDOMEN WITHOUT AND WITH CONTRAST (INCLUDING MRCP) TECHNIQUE: Multiplanar multisequence MR imaging of the abdomen was performed both before and after the administration of intravenous contrast. Heavily T2-weighted images of the biliary and pancreatic ducts were obtained, and three-dimensional MRCP images were rendered by post processing. CONTRAST:  8mL GADAVIST GADOBUTROL 1 MMOL/ML IV SOLN COMPARISON:  CT abdomen pelvis,  03/18/2023 FINDINGS: Lower chest: No acute abnormality. Hepatobiliary: No solid liver abnormality is seen. No gallstones, gallbladder wall thickening, or biliary dilatation. Pancreas: Thinly septated fluid signal cystic lesion arising from the tip of the pancreatic tail measuring 2.0 x 2.0  cm (series 4, image 20). No solid component or suspicious contrast enhancement. No pancreatic ductal dilatation or surrounding inflammatory changes. Spleen: Normal in size without significant abnormality. Adrenals/Urinary Tract: Adrenal glands are unremarkable. Kidneys are normal, without renal calculi, solid lesion, or hydronephrosis. Stomach/Bowel: Stomach is within normal limits. No evidence of bowel wall thickening, distention, or inflammatory changes. Vascular/Lymphatic: No significant vascular findings are present. No enlarged abdominal lymph nodes. Other: No abdominal wall hernia or abnormality. No ascites. Musculoskeletal: No acute or significant osseous findings. IMPRESSION: Thinly septated fluid signal cystic lesion arising from the tip of the pancreatic tail measuring 2.0 x 2.0 cm. No solid component or suspicious contrast enhancement. No pancreatic ductal dilatation or surrounding inflammatory changes. This is most consistent with a benign side branch IPMN or pseudocyst and is new compared to remote prior examination dated 2014. Given size and some internal complexity, recommend follow-up MRI in 1 year to ensure stability. Electronically Signed   By: Jearld Lesch M.D.   On: 04/06/2023 23:32       Assessment & Plan:  There are no diagnoses linked to this encounter.   Dale Aguilita, MD

## 2023-06-19 ENCOUNTER — Encounter: Payer: Self-pay | Admitting: Internal Medicine

## 2023-06-19 NOTE — Progress Notes (Signed)
 Patient ID: Kelly Massey, female   DOB: [birth year], 58 y.o.   MRN: [id number] Did not show for appt.

## 2023-06-26 ENCOUNTER — Other Ambulatory Visit: Payer: Self-pay | Admitting: Nurse Practitioner

## 2023-07-21 ENCOUNTER — Other Ambulatory Visit: Payer: Self-pay | Admitting: Internal Medicine

## 2023-07-21 ENCOUNTER — Ambulatory Visit: Admitting: Internal Medicine

## 2023-07-21 VITALS — BP 118/70 | HR 70 | Temp 98.0°F | Resp 16 | Ht 65.0 in | Wt 196.4 lb

## 2023-07-21 DIAGNOSIS — M79629 Pain in unspecified upper arm: Secondary | ICD-10-CM

## 2023-07-21 DIAGNOSIS — R739 Hyperglycemia, unspecified: Secondary | ICD-10-CM | POA: Diagnosis not present

## 2023-07-21 DIAGNOSIS — D72819 Decreased white blood cell count, unspecified: Secondary | ICD-10-CM | POA: Diagnosis not present

## 2023-07-21 DIAGNOSIS — Z860100 Personal history of colon polyps, unspecified: Secondary | ICD-10-CM

## 2023-07-21 DIAGNOSIS — E7800 Pure hypercholesterolemia, unspecified: Secondary | ICD-10-CM

## 2023-07-21 DIAGNOSIS — Z0000 Encounter for general adult medical examination without abnormal findings: Secondary | ICD-10-CM | POA: Diagnosis not present

## 2023-07-21 DIAGNOSIS — R935 Abnormal findings on diagnostic imaging of other abdominal regions, including retroperitoneum: Secondary | ICD-10-CM

## 2023-07-21 DIAGNOSIS — K219 Gastro-esophageal reflux disease without esophagitis: Secondary | ICD-10-CM

## 2023-07-21 LAB — CBC WITH DIFFERENTIAL/PLATELET
Basophils Absolute: 0 10*3/uL (ref 0.0–0.1)
Basophils Relative: 0.8 % (ref 0.0–3.0)
Eosinophils Absolute: 0 10*3/uL (ref 0.0–0.7)
Eosinophils Relative: 1.4 % (ref 0.0–5.0)
HCT: 43 % (ref 36.0–46.0)
Hemoglobin: 14.3 g/dL (ref 12.0–15.0)
Lymphocytes Relative: 51.7 % — ABNORMAL HIGH (ref 12.0–46.0)
Lymphs Abs: 1.4 10*3/uL (ref 0.7–4.0)
MCHC: 33.3 g/dL (ref 30.0–36.0)
MCV: 92.1 fl (ref 78.0–100.0)
Monocytes Absolute: 0.3 10*3/uL (ref 0.1–1.0)
Monocytes Relative: 11.2 % (ref 3.0–12.0)
Neutro Abs: 1 10*3/uL — ABNORMAL LOW (ref 1.4–7.7)
Neutrophils Relative %: 34.9 % — ABNORMAL LOW (ref 43.0–77.0)
Platelets: 200 10*3/uL (ref 150.0–400.0)
RBC: 4.67 Mil/uL (ref 3.87–5.11)
RDW: 14.6 % (ref 11.5–15.5)
WBC: 2.8 10*3/uL — ABNORMAL LOW (ref 4.0–10.5)

## 2023-07-21 LAB — BASIC METABOLIC PANEL WITH GFR
BUN: 9 mg/dL (ref 6–23)
CO2: 30 meq/L (ref 19–32)
Calcium: 9.4 mg/dL (ref 8.4–10.5)
Chloride: 101 meq/L (ref 96–112)
Creatinine, Ser: 0.83 mg/dL (ref 0.40–1.20)
GFR: 77.89 mL/min (ref >60.00)
Glucose, Bld: 87 mg/dL (ref 70–99)
Potassium: 4.3 meq/L (ref 3.5–5.1)
Sodium: 140 meq/L (ref 135–145)

## 2023-07-21 LAB — LIPID PANEL
Cholesterol: 228 mg/dL — ABNORMAL HIGH (ref 0–200)
HDL: 86.4 mg/dL (ref >39.00)
LDL Cholesterol: 124 mg/dL — ABNORMAL HIGH (ref 0–99)
NonHDL: 141.77
Total CHOL/HDL Ratio: 3
Triglycerides: 87 mg/dL (ref 0.0–149.0)
VLDL: 17.4 mg/dL (ref 0.0–40.0)

## 2023-07-21 LAB — HEPATIC FUNCTION PANEL
ALT: 14 U/L (ref 0–35)
AST: 17 U/L (ref 0–37)
Albumin: 4.5 g/dL (ref 3.5–5.2)
Alkaline Phosphatase: 66 U/L (ref 39–117)
Bilirubin, Direct: 0.1 mg/dL (ref 0.0–0.3)
Total Bilirubin: 0.6 mg/dL (ref 0.2–1.2)
Total Protein: 7.3 g/dL (ref 6.0–8.3)

## 2023-07-21 LAB — HEMOGLOBIN A1C: Hgb A1c MFr Bld: 6.1 % (ref 4.6–6.5)

## 2023-07-21 NOTE — Progress Notes (Signed)
 Subjective:    Patient ID: Kelly Massey, female    DOB: 10-17-1965, 58 y.o.   MRN: [id number]  Patient here for  Chief Complaint  Patient presents with   Annual Exam    HPI Here for a physical exam. Seeing Dr General Kenner. S/p EGD 05/10/23 - 2 cm hiatal hernia, antral gastritis. Recommended f/u abdominal MRI/MRCP in one year to reassess pancreatic cyst. History of IDA. On ferrous sulfate.  Her most recent colonoscopy 01/05/2021 identified 3 tubular adenomatous/hyperplastic polyps removed from the ascending and rectosigmoid colon.  Next colonoscopy due 01/2028. GI recommended famotidine  40mg  bid and dicyclomine  10mg  q 8 hrs prn. Not taking dicyclomine . With intermittent flares. Discussed continued f/u with GI. Breathing stable.    Past Medical History:  Diagnosis Date   Allergy    Anemia    Arthritis    knees - no meds   Colon polyps    HPP_ 2012   GERD (gastroesophageal reflux disease) 10/12/2010   EGD, positive H. pylori   HSV infection    History   Post-operative nausea and vomiting    Seasonal allergies    Past Surgical History:  Procedure Laterality Date   37 HOUR PH STUDY N/A 07/25/2019   Procedure: 24 HOUR PH STUDY;  Surgeon: Annis Kinder, DO;  Location: [facility];  Service: Gastroenterology;  Laterality: N/A;   ABDOMINAL HYSTERECTOMY     BALLOON DILATION N/A 07/24/2012   Procedure: BALLOON DILATION;  Surgeon: Claudette Cue, MD;  Location: [facility];  Service: Endoscopy;  Laterality: N/A;   BRAVO PH STUDY N/A 07/24/2012   Procedure: BRAVO PH STUDY;  Surgeon: Claudette Cue, MD;  Location: [facility];  Service: Endoscopy;  Laterality: N/A;   COLONOSCOPY     DILATION AND CURETTAGE OF UTERUS     SAB   ESOPHAGEAL MANOMETRY N/A 07/25/2019   Procedure: ESOPHAGEAL MANOMETRY (EM);  Surgeon: Annis Kinder, DO;  Location: [facility];  Service: Gastroenterology;  Laterality: N/A;   ESOPHAGOGASTRODUODENOSCOPY N/A 07/24/2012   Procedure:  ESOPHAGOGASTRODUODENOSCOPY (EGD);  Surgeon: Claudette Cue, MD;  Location: [facility];  Service: Endoscopy;  Laterality: N/A;   OVARIAN CYST REMOVAL  2004   laparotomy -left   ROBOTIC ASSISTED LAPAROSCOPIC LYSIS OF ADHESION N/A 03/21/2014   Procedure: ROBOTIC ASSISTED LAPAROSCOPIC EXTENSIVE LYSIS OF ADHESION (1 Hour);  Surgeon: Kandra Orn, MD;  Location: [facility];  Service: Gynecology;  Laterality: N/A;   ROBOTIC ASSISTED SALPINGO OOPHERECTOMY Left 03/21/2014   Procedure:  ROBOTIC ASSISTED LEFT OOPHORECTOMY;  Surgeon: Kandra Orn, MD;  Location: [facility];  Service: Gynecology;  Laterality: Left;   rotator cuff surgery  2017   SHOULDER ARTHROSCOPY Left    TENNIS ELBOW RELEASE/NIRSCHEL PROCEDURE Right 05/01/2015   Procedure: RIGHT ELBOW DEBRIDEMENT AND TENDON REPAIR;  Surgeon: Ferd Householder, MD;  Location: [facility];  Service: Orthopedics;  Laterality: Right;   UPPER GASTROINTESTINAL ENDOSCOPY     WISDOM TOOTH EXTRACTION     Family History  Problem Relation Age of Onset   Arthritis Mother    Stroke Mother    Hypertension Mother    Heart failure Mother    Dementia Mother    Lung cancer Father    Diabetes Sister    Diabetes Brother        x 2   Breast cancer Maternal Aunt    Throat cancer Maternal Aunt        Smoker   Esophageal cancer Maternal Aunt  Prostate cancer Maternal Uncle        x 2   Diabetes Maternal Uncle    Diabetes Paternal Grandmother    Diabetes Paternal Grandfather    Hypertension Other    Hyperlipidemia Other    Colon cancer Neg Hx    Stomach cancer Neg Hx    Rectal cancer Neg Hx    Colon polyps Neg Hx    Social History   Socioeconomic History   Marital status: Married    Spouse name: Not on file   Number of children: 0   Years of education: Not on file   Highest education level: Associate degree: academic program  Occupational History   Occupation: RESEARCH    Employer: [employer]  Tobacco Use   Smoking  status: Never   Smokeless tobacco: Never  Vaping Use   Vaping status: Never Used  Substance and Sexual Activity   Alcohol use: Not Currently    Comment: occassional   Drug use: No   Sexual activity: Yes    Birth control/protection: None, Surgical    Comment: hysterectomy  Other Topics Concern   Not on file  Social History Narrative   Teaching laboratory technician    Social Drivers of Health   Financial Resource Strain: Low Risk  (07/20/2023)   Overall Financial Resource Strain (CARDIA)    Difficulty of Paying Living Expenses: Not hard at all  Food Insecurity: No Food Insecurity (07/20/2023)   Hunger Vital Sign    Worried About Running Out of Food in the Last Year: Never true    Ran Out of Food in the Last Year: Never true  Transportation Needs: No Transportation Needs (07/20/2023)   PRAPARE - Administrator, Civil Service (Medical): No    Lack of Transportation (Non-Medical): No  Physical Activity: Insufficiently Active (07/20/2023)   Exercise Vital Sign    Days of Exercise per Week: 1 day    Minutes of Exercise per Session: 30 min  Stress: No Stress Concern Present (07/20/2023)   [employer] of Occupational Health - Occupational Stress Questionnaire    Feeling of Stress : Not at all  Social Connections: Socially Integrated (07/20/2023)   Social Connection and Isolation Panel [NHANES]    Frequency of Communication with Friends and Family: More than three times a week    Frequency of Social Gatherings with Friends and Family: Once a week    Attends Religious Services: More than 4 times per year    Active Member of [employer] or Organizations: Yes    Attends Engineer, structural: More than 4 times per year    Marital Status: Married     Review of Systems  Constitutional:  Negative for appetite change and unexpected weight change.  HENT:  Negative for congestion, sinus pressure and sore throat.   Eyes:  Negative for pain and visual disturbance.  Respiratory:  Negative  for cough, chest tightness and shortness of breath.   Cardiovascular:  Negative for chest pain, palpitations and leg swelling.  Gastrointestinal:  Negative for abdominal pain, constipation and diarrhea.  Genitourinary:  Negative for difficulty urinating and dysuria.  Musculoskeletal:  Negative for joint swelling and myalgias.  Skin:  Negative for color change and rash.  Neurological:  Negative for dizziness and headaches.  Hematological:  Negative for adenopathy. Does not bruise/bleed easily.  Psychiatric/Behavioral:  Negative for agitation and dysphoric mood.        Objective:     BP 118/70   Pulse 70  Temp 98 F (36.7 C)   Resp 16   Ht 5\' 5"  (1.651 m)   Wt 196 lb 6.4 oz (89.1 kg)   LMP 10/06/2011   SpO2 98%   BMI 32.68 kg/m  Wt Readings from Last 3 Encounters:  07/21/23 196 lb 6.4 oz (89.1 kg)  05/10/23 185 lb (83.9 kg)  05/06/23 185 lb (83.9 kg)    Physical Exam Vitals reviewed.  Constitutional:      General: She is not in acute distress.    Appearance: Normal appearance. She is well-developed.  HENT:     Head: Normocephalic and atraumatic.     Right Ear: External ear normal.     Left Ear: External ear normal.     Mouth/Throat:     Pharynx: No oropharyngeal exudate or posterior oropharyngeal erythema.  Eyes:     General: No scleral icterus.       Right eye: No discharge.        Left eye: No discharge.     Conjunctiva/sclera: Conjunctivae normal.  Neck:     Thyroid : No thyromegaly.  Cardiovascular:     Rate and Rhythm: Normal rate and regular rhythm.  Pulmonary:     Effort: No tachypnea, accessory muscle usage or respiratory distress.     Breath sounds: Normal breath sounds. No decreased breath sounds or wheezing.  Chest:  Breasts:    Right: No inverted nipple, mass, nipple discharge or tenderness (no axillary adenopathy).     Left: No inverted nipple, mass, nipple discharge or tenderness (no axilarry adenopathy).  Abdominal:     General: Bowel sounds  are normal.     Palpations: Abdomen is soft.     Tenderness: There is no abdominal tenderness.  Musculoskeletal:        General: No swelling or tenderness.     Cervical back: Neck supple.  Lymphadenopathy:     Cervical: No cervical adenopathy.  Skin:    Findings: No erythema or rash.  Neurological:     Mental Status: She is alert and oriented to person, place, and time.  Psychiatric:        Mood and Affect: Mood normal.        Behavior: Behavior normal.         Outpatient Encounter Medications as of 07/21/2023  Medication Sig   acyclovir  (ZOVIRAX ) 400 MG tablet TAKE 1 TABLET BY MOUTH 2 TIMES DAILY AS NEEDED.   azelastine  (ASTELIN ) 0.1 % nasal spray Place 1 spray into both nostrils 2 (two) times daily. Use in each nostril as directed   Calcium Carbonate-Vitamin D 600-400 MG-UNIT tablet Take 1 tablet by mouth daily.   dicyclomine  (BENTYL ) 10 MG capsule Take 1 capsule (10 mg total) by mouth every 8 (eight) hours as needed.   diphenhydrAMINE (BENADRYL) 12.5 MG/5ML elixir Take by mouth as needed.   famotidine  (PEPCID ) 40 MG tablet TAKE 1 TABLET BY MOUTH TWICE A DAY   ferrous sulfate 325 (65 FE) MG tablet Take 325 mg by mouth daily.    fluticasone  (FLONASE ) 50 MCG/ACT nasal spray Place 2 sprays into both nostrils daily. To use in place of your Nasonex    levocetirizine (XYZAL ) 5 MG tablet TAKE 1 TABLET (5 MG TOTAL) BY MOUTH DAILY.   meloxicam (MOBIC) 15 MG tablet Take 15 mg by mouth daily.   vitamin E 400 UNIT capsule Take 400 Units by mouth daily. Reported on 10/09/2015   Vonoprazan Fumarate 20 MG TABS Take 20 mg by mouth daily.   [DISCONTINUED] mometasone  (  NASONEX ) 50 MCG/ACT nasal spray PLACE 2 SPRAYS INTO THE NOSE DAILY. (Patient not taking: Reported on 05/10/2023)   No facility-administered encounter medications on file as of 07/21/2023.     Lab Results  Component Value Date   WBC 2.8 (L) 07/21/2023   HGB 14.3 07/21/2023   HCT 43.0 07/21/2023   PLT 200.0 07/21/2023   GLUCOSE 87  07/21/2023   CHOL 228 (H) 07/21/2023   TRIG 87.0 07/21/2023   HDL 86.40 07/21/2023   LDLCALC 124 (H) 07/21/2023   ALT 14 07/21/2023   AST 17 07/21/2023   NA 140 07/21/2023   K 4.3 07/21/2023   CL 101 07/21/2023   CREATININE 0.83 07/21/2023   BUN 9 07/21/2023   CO2 30 07/21/2023   TSH 1.36 12/10/2022   HGBA1C 6.1 07/21/2023    MR 3D Recon At Scanner Result Date: 04/16/2023 : CLINICAL DATA: Pancreatic cyst EXAM: MRI ABDOMEN WITHOUT AND WITH CONTRAST (INCLUDING MRCP) TECHNIQUE: Multiplanar multisequence MR imaging of the abdomen was performed both before and after the administration of intravenous contrast. Heavily T2-weighted images of the biliary and pancreatic ducts were obtained, and three-dimensional MRCP images were rendered by post processing. CONTRAST: 8mL GADAVIST  GADOBUTROL  1 MMOL/ML IV SOLN COMPARISON: CT abdomen pelvis, 03/18/2023 FINDINGS: Lower chest: No acute abnormality. Hepatobiliary: No solid liver abnormality is seen. No gallstones, gallbladder wall thickening, or biliary dilatation. Pancreas: Thinly septated fluid signal cystic lesion arising from the tip of the pancreatic tail measuring 2.0 x 2.0 cm (series 4, image 20). No solid component or suspicious contrast enhancement. No pancreatic ductal dilatation or surrounding inflammatory changes. Spleen: Normal in size without significant abnormality. Adrenals/Urinary Tract: Adrenal glands are unremarkable. Kidneys are normal, without renal calculi, solid lesion, or hydronephrosis. Stomach/Bowel: Stomach is within normal limits. No evidence of bowel wall thickening, distention, or inflammatory changes. Vascular/Lymphatic: No significant vascular findings are present. No enlarged abdominal lymph nodes. Other: No abdominal wall hernia or abnormality. No ascites. Musculoskeletal: No acute or significant osseous findings. IMPRESSION: Thinly septated fluid signal cystic lesion arising from the tip of the pancreatic tail measuring 2.0 x 2.0  cm. No solid component or suspicious contrast enhancement. No pancreatic ductal dilatation or surrounding inflammatory changes. This is most consistent with a benign side branch IPMN or pseudocyst and is new compared to remote prior examination dated 2014. Given size and some internal complexity, recommend follow-up MRI in 1 year to ensure stability. Electronically Signed By: Fredricka Jenny M.D. On: 04/06/2023 23:32 Electronically Signed   By: Fredricka Jenny M.D.   On: 04/16/2023 15:03   MR ABDOMEN MRCP W WO CONTAST Result Date: 04/06/2023 CLINICAL DATA:  Pancreatic cyst EXAM: MRI ABDOMEN WITHOUT AND WITH CONTRAST (INCLUDING MRCP) TECHNIQUE: Multiplanar multisequence MR imaging of the abdomen was performed both before and after the administration of intravenous contrast. Heavily T2-weighted images of the biliary and pancreatic ducts were obtained, and three-dimensional MRCP images were rendered by post processing. CONTRAST:  8mL GADAVIST  GADOBUTROL  1 MMOL/ML IV SOLN COMPARISON:  CT abdomen pelvis, 03/18/2023 FINDINGS: Lower chest: No acute abnormality. Hepatobiliary: No solid liver abnormality is seen. No gallstones, gallbladder wall thickening, or biliary dilatation. Pancreas: Thinly septated fluid signal cystic lesion arising from the tip of the pancreatic tail measuring 2.0 x 2.0 cm (series 4, image 20). No solid component or suspicious contrast enhancement. No pancreatic ductal dilatation or surrounding inflammatory changes. Spleen: Normal in size without significant abnormality. Adrenals/Urinary Tract: Adrenal glands are unremarkable. Kidneys are normal, without renal calculi, solid lesion, or  hydronephrosis. Stomach/Bowel: Stomach is within normal limits. No evidence of bowel wall thickening, distention, or inflammatory changes. Vascular/Lymphatic: No significant vascular findings are present. No enlarged abdominal lymph nodes. Other: No abdominal wall hernia or abnormality. No ascites. Musculoskeletal: No  acute or significant osseous findings. IMPRESSION: Thinly septated fluid signal cystic lesion arising from the tip of the pancreatic tail measuring 2.0 x 2.0 cm. No solid component or suspicious contrast enhancement. No pancreatic ductal dilatation or surrounding inflammatory changes. This is most consistent with a benign side branch IPMN or pseudocyst and is new compared to remote prior examination dated 2014. Given size and some internal complexity, recommend follow-up MRI in 1 year to ensure stability. Electronically Signed   By: Fredricka Jenny M.D.   On: 04/06/2023 23:32       Assessment & Plan:  Routine general medical examination at a health care facility  Health care maintenance Assessment & Plan: Physical today 07/21/23. Gets breasts, pelvic and pap smears through gyn.  Breast exam today. Mammogram 11/03/22 - Birads I.  Followed by GI.  Colonoscopy 01/05/21 (three polyps removed and diverticulosis).     Pain in axilla, unspecified laterality Assessment & Plan: Bilateral axillary pain. No abnormality noted on exam. Schedule axillary ultrasound to further evlauate.   Orders: -     US  AXILLA LEFT; Future -     US  AXILLA RIGHT; Future  Hypercholesterolemia Assessment & Plan: The 10-year ASCVD risk score (Arnett DK, et al., 2019) is: 2.7%   Values used to calculate the score:     Age: 35 years     Sex: Female     Is Non-Hispanic African American: Yes     Diabetic: No     Tobacco smoker: No     Systolic Blood Pressure: 118 mmHg     Is BP treated: No     HDL Cholesterol: 86.4 mg/dL     Total Cholesterol: 228 mg/dL  Low cholesterol diet and exercise. Follow lipid panel.   Orders: -     Lipid panel -     Hepatic function panel -     Basic metabolic panel with GFR  Hyperglycemia Assessment & Plan: Low carb diet and exercise. Follow met b and A1c.   Orders: -     Hemoglobin A1c  Leukopenia, unspecified type Assessment & Plan: Has been worked up by hematology. Felt to be  related to PPI. Follow cbc.   Orders: -     CBC with Differential/Platelet  Abnormal MRI of abdomen Assessment & Plan: MRI 03/2023 - Thinly septated fluid signal cystic lesion arising from the tip of the pancreatic tail measuring 2.0 x 2.0 cm. No solid component or suspicious contrast enhancement. No pancreatic ductal dilatation or surrounding inflammatory changes. This is most consistent with a benign side branch IPMN or pseudocyst and is new compared to remote prior examination dated 2014. Given size and some internal complexity, recommend follow-up MRI in 1 year to ensure stability.    Gastroesophageal reflux disease without esophagitis Assessment & Plan: Seeing Dr General Kenner. S/p EGD 05/10/23 - 2 cm hiatal hernia, antral gastritis. Recommended f/u abdominal MRI/MRCP in one year to reassess pancreatic cyst. History of IDA. On ferrous sulfate.  Her most recent colonoscopy 01/05/2021 identified 3 tubular adenomatous/hyperplastic polyps removed from the ascending and rectosigmoid colon.  Next colonoscopy due 01/2028. GI recommended famotidine  40mg  bid and dicyclomine  10mg  q 8 hrs prn. Not taking dicyclomine . With intermittent flares. Discussed continued f/u with GI.   History of colonic polyps  Assessment & Plan: Colonoscopy 01/25/21 - Tortuous colon. One 3 mm polyp in the ascending colon, removed with a cold snare. Two 3 mm polyps at the recto-sigmoid colon, removed with a cold snare. Diverticulosis - sigmoid.       Dellar Fenton, MD

## 2023-07-21 NOTE — Assessment & Plan Note (Signed)
 Physical today 07/21/23. Gets breasts, pelvic and pap smears through gyn.  Breast exam today. Mammogram 11/03/22 - Birads I.  Followed by GI.  Colonoscopy 01/05/21 (three polyps removed and diverticulosis).

## 2023-07-22 ENCOUNTER — Encounter: Payer: Self-pay | Admitting: Internal Medicine

## 2023-07-22 NOTE — Assessment & Plan Note (Signed)
 Low-carb diet and exercise.  Follow met b and A1c.

## 2023-07-22 NOTE — Assessment & Plan Note (Signed)
 Has been worked up by hematology. Felt to be related to PPI. Follow cbc.

## 2023-07-22 NOTE — Assessment & Plan Note (Signed)
 MRI 03/2023 - Thinly septated fluid signal cystic lesion arising from the tip of the pancreatic tail measuring 2.0 x 2.0 cm. No solid component or suspicious contrast enhancement. No pancreatic ductal dilatation or surrounding inflammatory changes. This is most consistent with a benign side branch IPMN or pseudocyst and is new compared to remote prior examination dated 2014. Given size and some internal complexity, recommend follow-up MRI in 1 year to ensure stability.

## 2023-07-22 NOTE — Assessment & Plan Note (Signed)
Colonoscopy 01/25/21 - Tortuous colon. One 3 mm polyp in the ascending colon, removed with a cold snare. Two 3 mm polyps at the recto-sigmoid colon, removed with a cold snare. Diverticulosis - sigmoid.  

## 2023-07-22 NOTE — Assessment & Plan Note (Signed)
 Seeing Dr General Kenner. S/p EGD 05/10/23 - 2 cm hiatal hernia, antral gastritis. Recommended f/u abdominal MRI/MRCP in one year to reassess pancreatic cyst. History of IDA. On ferrous sulfate.  Her most recent colonoscopy 01/05/2021 identified 3 tubular adenomatous/hyperplastic polyps removed from the ascending and rectosigmoid colon.  Next colonoscopy due 01/2028. GI recommended famotidine  40mg  bid and dicyclomine  10mg  q 8 hrs prn. Not taking dicyclomine . With intermittent flares. Discussed continued f/u with GI.

## 2023-07-22 NOTE — Assessment & Plan Note (Signed)
 Bilateral axillary pain. No abnormality noted on exam. Schedule axillary ultrasound to further evlauate.

## 2023-07-22 NOTE — Assessment & Plan Note (Signed)
 The 10-year ASCVD risk score (Arnett DK, et al., 2019) is: 2.7%   Values used to calculate the score:     Age: 58 years     Sex: Female     Is Non-Hispanic African American: Yes     Diabetic: No     Tobacco smoker: No     Systolic Blood Pressure: 118 mmHg     Is BP treated: No     HDL Cholesterol: 86.4 mg/dL     Total Cholesterol: 228 mg/dL  Low cholesterol diet and exercise. Follow lipid panel.

## 2023-07-28 ENCOUNTER — Other Ambulatory Visit: Payer: Self-pay | Admitting: Internal Medicine

## 2023-09-01 ENCOUNTER — Ambulatory Visit

## 2023-09-01 ENCOUNTER — Ambulatory Visit
Admission: RE | Admit: 2023-09-01 | Discharge: 2023-09-01 | Disposition: A | Discharge status: Home / Self Care | Source: Ambulatory Visit | Attending: Internal Medicine | Admitting: Internal Medicine

## 2023-09-01 DIAGNOSIS — M79629 Pain in unspecified upper arm: Secondary | ICD-10-CM

## 2023-09-02 ENCOUNTER — Ambulatory Visit: Payer: Self-pay | Admitting: Internal Medicine

## 2023-09-21 ENCOUNTER — Encounter: Admitting: Internal Medicine

## 2023-09-30 ENCOUNTER — Other Ambulatory Visit: Payer: Self-pay | Admitting: Internal Medicine

## 2023-10-22 ENCOUNTER — Other Ambulatory Visit: Payer: Self-pay | Admitting: Internal Medicine

## 2023-11-08 ENCOUNTER — Telehealth: Payer: Self-pay

## 2023-11-08 NOTE — Telephone Encounter (Signed)
 Patient has been scheduled to see Dr Leigh in follow up on 01/26/24 at 830 am (next available date). Patient verbalizes understanding and is in agreement with plan.

## 2023-11-08 NOTE — Telephone Encounter (Signed)
 Reminder was received in Epic: Dr Leigh Pt.

## 2023-11-08 NOTE — Telephone Encounter (Signed)
-----   Message from Nurse Elspeth RAMAN sent at 05/11/2023 11:20 AM EST ----- Personal reminder sent 05/11/2023   Message Received: 2 days ago Cara Elida HERO, NP  Wonda Elspeth, RN    Previous Messages  Routed Note  Author: Cara Elida HERO, NP Service: Gastroenterology Author Type: Nurse Practitioner Filed: 05/09/2023  1:38 PM Encounter Date: 05/06/2023 Status: Signed Editor: Cara Elida HERO, NP (Nurse Practitioner)  Elspeth, please contact patient and let her know Dr. Leigh reviewed her abdominal MRI and he assessed that it was reasonable to repeat an abdominal MRI/MRCP in 1 year to reassess the pancreatic cyst.  However, she may schedule a follow-up appointment with Dr. Leigh in 6 months to further discuss her concerns and to consider earlier imaging.  Thank you.   Please enter office appointment recall 6 months Please enter abdominal MRI/MRCP recall 1 year if not already entered.  Thank you

## 2023-11-17 ENCOUNTER — Ambulatory Visit
Admission: EM | Admit: 2023-11-17 | Discharge: 2023-11-17 | Disposition: A | Discharge status: Home / Self Care | Attending: Emergency Medicine | Admitting: Emergency Medicine

## 2023-11-17 ENCOUNTER — Encounter: Payer: Self-pay | Admitting: Emergency Medicine

## 2023-11-17 DIAGNOSIS — U071 COVID-19: Secondary | ICD-10-CM | POA: Insufficient documentation

## 2023-11-17 LAB — GROUP A STREP BY PCR: Group A Strep by PCR: NOT DETECTED

## 2023-11-17 LAB — SARS CORONAVIRUS 2 BY RT PCR: SARS Coronavirus 2 by RT PCR: POSITIVE — AB

## 2023-11-17 MED ORDER — BENZONATATE 100 MG PO CAPS: 200.0000 mg | ORAL_CAPSULE | Freq: Three times a day (TID) | ORAL | 0 refills | Status: AC

## 2023-11-17 MED ORDER — PROMETHAZINE-DM 6.25-15 MG/5ML PO SYRP: 5.0000 mL | ORAL_SOLUTION | Freq: Four times a day (QID) | ORAL | 0 refills | Status: AC | PRN

## 2023-11-17 MED ORDER — IPRATROPIUM BROMIDE 0.06 % NA SOLN: 2.0000 | Freq: Four times a day (QID) | NASAL | 12 refills | Status: AC

## 2023-11-17 NOTE — ED Triage Notes (Signed)
 Pt c/o sore throat x3 days & cough that started today. Has tried tussionex w/o relief.

## 2023-11-17 NOTE — ED Provider Notes (Addendum)
 [facility]    CSN: [id number] Arrival date & time: 11/17/23  1645      History   Chief Complaint Chief Complaint  Patient presents with   Sore Throat   Cough    HPI Kelly Massey is a 58 y.o. female.   HPI  58 year old female with past medical history significant for GERD, high cholesterol, Barrett's esophagus, and seasonal allergies presents for evaluation of a sore throat that began 3 days ago and a nonproductive cough that began today.  She also endorses runny nose, nasal congestion, and left ear pain.  She denies any fever, shortness breath, or wheezing.  No known sick contacts or recent travel out of state or country.  Past Medical History:  Diagnosis Date   Allergy    Anemia    Arthritis    knees - no meds   Colon polyps    HPP_ 2012   GERD (gastroesophageal reflux disease) 10/12/2010   EGD, positive H. pylori   HSV infection    History   Post-operative nausea and vomiting    Seasonal allergies     Patient Active Problem List   Diagnosis Date Noted   Axillary pain 07/21/2023   Abnormal MRI of abdomen 04/08/2023   Abdominal pain 02/28/2023   Foot pain 10/30/2022   Cough 06/26/2022   Rash 03/29/2022   Elevated blood pressure reading 03/07/2022   Vaginal irritation 01/25/2022   Dysuria 01/20/2022   Hip pain 07/05/2021   Right hip pain 03/01/2021   Plantar fasciitis 01/05/2021   Hyperglycemia 07/23/2020   Sciatica 02/23/2020   Tinnitus 12/02/2019   Belching    Globus sensation    Hot flashes 07/01/2019   Loss of taste 02/05/2019   Headache 08/28/2018   Chest pain 10/30/2017   Witnessed apneic spells 10/31/2016   Other neutropenia (HCC) 08/10/2016   Vertigo 07/28/2016   Health care maintenance 11/21/2014   History of colonic polyps 11/21/2014   Dysphagia 11/19/2014   Trochanteric bursitis of left hip 10/08/2014   URI (upper respiratory infection) 02/10/2014   Pelvic pain in female 12/25/2013   Personal history of ovarian cyst  12/25/2013   Change in bowel movement 11/11/2013   breast tenderness 09/29/2013   Dysphagia 06/12/2012   Barrett's esophagus 06/12/2012   Environmental allergies 02/13/2012   Enlarged thyroid  02/13/2012   GERD (gastroesophageal reflux disease) 02/08/2012   Leukopenia 02/08/2012   Fatigue 02/08/2012   Hypercholesterolemia 02/08/2012    Past Surgical History:  Procedure Laterality Date   24 HOUR PH STUDY N/A 07/25/2019   Procedure: 24 HOUR PH STUDY;  Surgeon: San Sandor GAILS, DO;  Location: [facility];  Service: Gastroenterology;  Laterality: N/A;   ABDOMINAL HYSTERECTOMY     BALLOON DILATION N/A 07/24/2012   Procedure: BALLOON DILATION;  Surgeon: Lamar JONETTA Aho, MD;  Location: [facility];  Service: Endoscopy;  Laterality: N/A;   BRAVO PH STUDY N/A 07/24/2012   Procedure: BRAVO PH STUDY;  Surgeon: Lamar JONETTA Aho, MD;  Location: [facility];  Service: Endoscopy;  Laterality: N/A;   COLONOSCOPY     DILATION AND CURETTAGE OF UTERUS     SAB   ESOPHAGEAL MANOMETRY N/A 07/25/2019   Procedure: ESOPHAGEAL MANOMETRY (EM);  Surgeon: San Sandor GAILS, DO;  Location: [facility];  Service: Gastroenterology;  Laterality: N/A;   ESOPHAGOGASTRODUODENOSCOPY N/A 07/24/2012   Procedure: ESOPHAGOGASTRODUODENOSCOPY (EGD);  Surgeon: Lamar JONETTA Aho, MD;  Location: [facility];  Service: Endoscopy;  Laterality: N/A;   OVARIAN CYST REMOVAL  2004  laparotomy -left   ROBOTIC ASSISTED LAPAROSCOPIC LYSIS OF ADHESION N/A 03/21/2014   Procedure: ROBOTIC ASSISTED LAPAROSCOPIC EXTENSIVE LYSIS OF ADHESION (1 Hour);  Surgeon: Dickie DELENA Carder, MD;  Location: [facility];  Service: Gynecology;  Laterality: N/A;   ROBOTIC ASSISTED SALPINGO OOPHERECTOMY Left 03/21/2014   Procedure:  ROBOTIC ASSISTED LEFT OOPHORECTOMY;  Surgeon: Dickie DELENA Carder, MD;  Location: [facility];  Service: Gynecology;  Laterality: Left;   rotator cuff surgery  2017   SHOULDER ARTHROSCOPY Left    TENNIS ELBOW RELEASE/NIRSCHEL  PROCEDURE Right 05/01/2015   Procedure: RIGHT ELBOW DEBRIDEMENT AND TENDON REPAIR;  Surgeon: Toribio JULIANNA Chancy, MD;  Location: [facility];  Service: Orthopedics;  Laterality: Right;   UPPER GASTROINTESTINAL ENDOSCOPY     WISDOM TOOTH EXTRACTION      OB History   No obstetric history on file.      Home Medications    Prior to Admission medications   Medication Sig Start Date End Date Taking? Authorizing Provider  benzonatate  (TESSALON ) 100 MG capsule Take 2 capsules (200 mg total) by mouth every 8 (eight) hours. 11/17/23  Yes Bernardino Ditch, NP  ipratropium (ATROVENT ) 0.06 % nasal spray Place 2 sprays into both nostrils 4 (four) times daily. 11/17/23  Yes Bernardino Ditch, NP  promethazine -dextromethorphan (PROMETHAZINE -DM) 6.25-15 MG/5ML syrup Take 5 mLs by mouth 4 (four) times daily as needed. 11/17/23  Yes Bernardino Ditch, NP  acyclovir  (ZOVIRAX ) 400 MG tablet TAKE 1 TABLET BY MOUTH TWICE A DAY AS NEEDED 09/30/23   Glendia Shad, MD  azelastine  (ASTELIN ) 0.1 % nasal spray Place 1 spray into both nostrils 2 (two) times daily. Use in each nostril as directed 06/25/22   Glendia Shad, MD  Calcium Carbonate-Vitamin D 600-400 MG-UNIT tablet Take 1 tablet by mouth daily.    [provider]  dicyclomine  (BENTYL ) 10 MG capsule Take 1 capsule (10 mg total) by mouth every 8 (eight) hours as needed. 05/06/23   Kennedy-Smith, Colleen M, NP  diphenhydrAMINE (BENADRYL) 12.5 MG/5ML elixir Take by mouth as needed.    [provider]  famotidine  (PEPCID ) 40 MG tablet TAKE 1 TABLET BY MOUTH TWICE A DAY 06/27/23   Kennedy-Smith, Colleen M, NP  ferrous sulfate 325 (65 FE) MG tablet Take 325 mg by mouth daily.     [provider]  fluticasone  (FLONASE ) 50 MCG/ACT nasal spray Place 2 sprays into both nostrils daily. To use in place of your Nasonex  10/10/20   Tonette Lauraine HERO, PA-C  levocetirizine (XYZAL ) 5 MG tablet TAKE 1 TABLET (5 MG TOTAL) BY MOUTH DAILY. 10/24/23   Glendia Shad, MD  meloxicam (MOBIC) 15 MG tablet Take 15 mg by mouth daily. 04/20/23   [provider]  vitamin E 400 UNIT capsule Take 400 Units by mouth daily. Reported on 10/09/2015    [provider]  Vonoprazan Fumarate 20 MG TABS Take 20 mg by mouth daily.    [provider]    Family History Family History  Problem Relation Age of Onset   Arthritis Mother    Stroke Mother    Hypertension Mother    Heart failure Mother    Dementia Mother    Lung cancer Father    Diabetes Sister    Diabetes Brother        x 2   Breast cancer Maternal Aunt    Throat cancer Maternal Aunt        Smoker   Esophageal cancer Maternal Aunt    Prostate cancer  Maternal Uncle        x 2   Diabetes Maternal Uncle    Diabetes Paternal Grandmother    Diabetes Paternal Grandfather    Hypertension Other    Hyperlipidemia Other    Colon cancer Neg Hx    Stomach cancer Neg Hx    Rectal cancer Neg Hx    Colon polyps Neg Hx     Social History Social History   Tobacco Use   Smoking status: Never   Smokeless tobacco: Never  Vaping Use   Vaping status: Never Used  Substance Use Topics   Alcohol use: Not Currently    Comment: occassional   Drug use: No     Allergies   Adhesive [tape], Aspirin, Ibuprofen, Penicillins, Nsaids, Oxycodone , Shrimp extract, and Shrimp [shellfish allergy]   Review of Systems Review of Systems  Constitutional:  Negative for fever.  HENT:  Positive for congestion, ear pain, rhinorrhea and sore throat.   Respiratory:  Positive for cough. Negative for shortness of breath and wheezing.      Physical Exam Triage Vital Signs ED Triage Vitals  Encounter Vitals Group     BP 11/17/23 1810 (!) 150/89     Girls Systolic BP Percentile --      Girls Diastolic BP Percentile --      Boys Systolic BP Percentile --      Boys Diastolic BP Percentile --      Pulse Rate 11/17/23 1810 86     Resp 11/17/23 1810 16     Temp 11/17/23 1810 99.4 F (37.4 C)      Temp Source 11/17/23 1810 Oral     SpO2 11/17/23 1810 96 %     Weight 11/17/23 1809 198 lb (89.8 kg)     Height 11/17/23 1809 5' 5 (1.651 m)     Head Circumference --      Peak Flow --      Pain Score 11/17/23 1813 7     Pain Loc --      Pain Education --      Exclude from Growth Chart --    No data found.  Updated Vital Signs BP (!) 150/89 (BP Location: Right Arm)   Pulse 86   Temp 99.4 F (37.4 C) (Oral)   Resp 16   Ht 5' 5 (1.651 m)   Wt 198 lb (89.8 kg)   LMP 10/06/2011   SpO2 96%   BMI 32.95 kg/m   Visual Acuity Right Eye Distance:   Left Eye Distance:   Bilateral Distance:    Right Eye Near:   Left Eye Near:    Bilateral Near:     Physical Exam Vitals and nursing note reviewed.  Constitutional:      Appearance: Normal appearance. She is not ill-appearing.  HENT:     Head: Normocephalic and atraumatic.     Right Ear: Tympanic membrane, ear canal and external ear normal. There is no impacted cerumen.     Left Ear: Tympanic membrane, ear canal and external ear normal. There is no impacted cerumen.     Nose: Congestion and rhinorrhea present.     Comments: His mucosa is edematous and erythematous with clear discharge in both nares.    Mouth/Throat:     Mouth: Mucous membranes are moist.     Pharynx: Oropharynx is clear. Posterior oropharyngeal erythema present. No oropharyngeal exudate.     Comments: Tonsillar pillars are 1+ edematous erythematous and the soft palate is also erythematous.  No appreciable exudate. Cardiovascular:     Rate and Rhythm: Normal rate and regular rhythm.     Pulses: Normal pulses.     Heart sounds: Normal heart sounds. No murmur heard.    No friction rub. No gallop.  Pulmonary:     Effort: Pulmonary effort is normal.     Breath sounds: Normal breath sounds. No wheezing, rhonchi or rales.  Musculoskeletal:     Cervical back: Normal range of motion and neck supple. No tenderness.  Lymphadenopathy:     Cervical: No cervical  adenopathy.  Skin:    General: Skin is warm and dry.     Capillary Refill: Capillary refill takes less than 2 seconds.     Findings: No rash.  Neurological:     General: No focal deficit present.     Mental Status: She is alert and oriented to person, place, and time.      UC Treatments / Results  Labs (all labs ordered are listed, but only abnormal results are displayed) Labs Reviewed  SARS CORONAVIRUS 2 BY RT PCR - Abnormal; Notable for the following components:      Result Value   SARS Coronavirus 2 by RT PCR POSITIVE (*)    All other components within normal limits  GROUP A STREP BY PCR    EKG   Radiology No results found.  Procedures Procedures (including critical care time)  Medications Ordered in UC Medications - No data to display  Initial Impression / Assessment and Plan / UC Course  I have reviewed the triage vital signs and the nursing notes.  Pertinent labs & imaging results that were available during my care of the patient were reviewed by me and considered in my medical decision making (see chart for details).   Patient is a pleasant, nontoxic-appearing 58 year old female presenting for evaluation of respiratory symptoms as outlined HPI above.  Her most prominent symptom is a sore throat and she does have edematous erythematous tonsillar pillars as well as erythema to her soft palate but no appreciable exudate.  No cervical lymphadenopathy is present.  Additionally, she does have inflamed nasal mucosa with clear rhinorrhea.  Cardiopulmonary exam is benign.  Differential diagnose include COVID, influenza, strep prejudice, viral respiratory illness.  I will order a COVID PCR and strep PCR given the patient has had symptoms for 3 days.  COVID PCR is positive.  Strep PCR is negative.  I will discharge patient with diagnosis of COVID-19.  I will prescribe Atrovent  spray to open nasal congestion.  Tessalon  Perles (cough syrup cough and congestion.  Return ER  precautions reviewed.   Final Clinical Impressions(s) / UC Diagnoses   Final diagnoses:  COVID-19     Discharge Instructions      CDC guidelines state that you must wear a mask for the first 5 days of symptoms when you are around other people.  After 5 days you no longer need to mask as you are no longer considered infectious.  There is no longer need to quarantine unless you have a fever.  If you do have a fever then you need to quarantine until you have been fever free for 24 hours without taking Tylenol  and/or ibuprofen.  Use over-the-counter Tylenol  and/or ibuprofen according to the package instructions as needed for fever and pain.  Use the Atrovent  nasal spray, 2 squirts up each nostril every 6 hours, as needed for nasal congestion and runny nose.  Use the Tessalon  Perles every 8 hours during the day  as needed for cough.  Take them with a small sip of water.  You may experience numbness to the base of your tongue or metallic taste in her mouth, this is normal.  Use the Promethazine  DM cough syrup at bedtime as needed for cough and congestion.  Be mindful this medication will make you sleepy.  If you develop any worsening respiratory symptoms such as shortness of breath, shortness of breath at rest, feel as though you cannot catch your breath, you are unable to speak in full sentences, or, as a late sign, your lips begin turning blue you need to call 911 and go to the ER for evaluation.      ED Prescriptions     Medication Sig Dispense Auth. Provider   benzonatate  (TESSALON ) 100 MG capsule Take 2 capsules (200 mg total) by mouth every 8 (eight) hours. 21 capsule Bernardino Ditch, NP   ipratropium (ATROVENT ) 0.06 % nasal spray Place 2 sprays into both nostrils 4 (four) times daily. 15 mL Bernardino Ditch, NP   promethazine -dextromethorphan (PROMETHAZINE -DM) 6.25-15 MG/5ML syrup Take 5 mLs by mouth 4 (four) times daily as needed. 118 mL Bernardino Ditch, NP      PDMP not reviewed this  encounter.   Bernardino Ditch, NP 11/17/23 Kelly Massey    Bernardino Ditch, NP 11/17/23 2012

## 2023-11-17 NOTE — Discharge Instructions (Addendum)
 CDC guidelines state that you must wear a mask for the first 5 days of symptoms when you are around other people.  After 5 days you no longer need to mask as you are no longer considered infectious.  There is no longer need to quarantine unless you have a fever.  If you do have a fever then you need to quarantine until you have been fever free for 24 hours without taking Tylenol and/or ibuprofen.  Use over-the-counter Tylenol and/or ibuprofen according to the package instructions as needed for fever and pain.  Use the Atrovent nasal spray, 2 squirts up each nostril every 6 hours, as needed for nasal congestion and runny nose.  Use the Tessalon Perles every 8 hours during the day as needed for cough.  Take them with a small sip of water.  You may experience numbness to the base of your tongue or metallic taste in her mouth, this is normal.  Use the Promethazine DM cough syrup at bedtime as needed for cough and congestion.  Be mindful this medication will make you sleepy.  If you develop any worsening respiratory symptoms such as shortness of breath, shortness of breath at rest, feel as though you cannot catch your breath, you are unable to speak in full sentences, or, as a late sign, your lips begin turning blue you need to call 911 and go to the ER for evaluation.

## 2024-01-26 ENCOUNTER — Ambulatory Visit: Admitting: Gastroenterology

## 2024-02-28 NOTE — Progress Notes (Unsigned)
 Chief Complaint: Primary GI MD: Dr. Leigh  HPI:  Kelly Massey is a 58 year old female with a past medical history of arthritis, asthma, hyperlipidemia, chronic leukopenia, iron deficiency anemia, dysphagia, GERD and colon polyps.  Presents for follow-up   Discussed the use of AI scribe software for clinical note transcription with the patient, who gave verbal consent to proceed.  History of Present Illness      PREVIOUS GI WORKUP   CTAP with contrast 03/18/2023: FINDINGS: Lower chest: Bibasilar atelectasis/scarring.   Hepatobiliary: No suspicious hepatic lesion. Gallbladder is unremarkable. No biliary ductal dilation.   Pancreas: No pancreatic ductal dilation or evidence of acute inflammation. 15 mm cyst in the tail of the pancreas on image 21/2.   Spleen: No splenomegaly or focal splenic lesion.   Adrenals/Urinary Tract: Bilateral adrenal glands appear normal. No hydronephrosis. Kidneys demonstrate symmetric enhancement. Urinary bladder is unremarkable for degree of distension.   Stomach/Bowel: Wall thickening versus underdistention of the gastric antrum. No pathologic dilation of small or large bowel. Sigmoid colonic diverticulosis without findings of acute diverticulitis.   Vascular/Lymphatic: Normal caliber abdominal aorta. Smooth IVC contours. The portal, splenic and superior mesenteric veins are patent.   Reproductive: Status post hysterectomy. No adnexal masses.   Other: No significant abdominopelvic free fluid.   Musculoskeletal: No acute osseous abnormality   IMPRESSION: 1. Wall thickening versus underdistention of the gastric antrum, correlate with symptoms of gastritis and consider further evaluation with endoscopy. 2. Sigmoid colonic diverticulosis without findings of acute diverticulitis. 3. 15 mm cyst in the tail of the pancreas, likely a side branch IPMN. Recommend follow up pre and post contrast MRI/MRCP.    Abdominal MRI/MRCP with  and without contrast 04/05/2023:   FINDINGS: Lower chest: No acute abnormality.   Hepatobiliary: No solid liver abnormality is seen. No gallstones, gallbladder wall thickening, or biliary dilatation.   Pancreas: Thinly septated fluid signal cystic lesion arising from the tip of the pancreatic tail measuring 2.0 x 2.0 cm (series 4, image 20). No solid component or suspicious contrast enhancement. No pancreatic ductal dilatation or surrounding inflammatory changes.   Spleen: Normal in size without significant abnormality.   Adrenals/Urinary Tract: Adrenal glands are unremarkable. Kidneys are normal, without renal calculi, solid lesion, or hydronephrosis.   Stomach/Bowel: Stomach is within normal limits. No evidence of bowel wall thickening, distention, or inflammatory changes.   Vascular/Lymphatic: No significant vascular findings are present. No enlarged abdominal lymph nodes.   Other: No abdominal wall hernia or abnormality. No ascites.   Musculoskeletal: No acute or significant osseous findings.   IMPRESSION: Thinly septated fluid signal cystic lesion arising from the tip of the pancreatic tail measuring 2.0 x 2.0 cm. No solid component or suspicious contrast enhancement. No pancreatic ductal dilatation or surrounding inflammatory changes. This is most consistent with a benign side branch IPMN ( intraductal papillary mucinous neoplasm (IPMN) or pseudocyst and is new compared to remote prior examination dated 2014. Given size and some internal complexity, recommend follow-up MRI in 1 year to ensure stability.   GI PROCEDURES:   EGD 05/10/2023: - Esophagogastric landmarks identified.  - 2 cm hiatal hernia.  - Normal esophagus otherwise.  - Antral gastritis.  - Normal stomach otherwise - biopsies taken to rule out H pylori.  - Normal examined duodenum.  1. Surgical [P], gastric antrum and gastric body :       GASTRIC ANTRAL AND OXYNTIC MUCOSA WITH SLIGHT CHRONIC  INFLAMMATION.       NEGATIVE FOR HELICOBACTER  PYLORI.    EGD 01/28/2022: - Z-line regular, 37 cm from the incisors.  - 1 cm hiatal hernia.  - Normal esophagus otherwise  - empiric dilation performed to 18mm as above  - Normal stomach.  - Focal area of nodular / polypoid mucosa near the ampulla. Biopsied to ensure no adenomatous change.  - Normal duodenum otherwise  FRAGMENTS OF NORMAL DUODENAL MUCOSA. THERE ARE NO DIAGNOSTIC FEATURES OF CELIAC DISEASE. NEGATIVE FOR DYSPLASIA AND MALIGNANCY.   Colonoscopy 01/05/2021: - Tortuous colon. - One 3 mm polyp in the ascending colon, removed with a cold snare. Resected and retrieved. - Two 3 mm polyps at the recto-sigmoid colon, removed with a cold snare. Resected andretrieved. - Diverticulosis in the sigmoid colon. - The examination was otherwise normal. - 7 year colonoscopy recall    1. Surgical [P], colon, rectosigmoid x1, polyp (1) - HYPERPLASTIC POLYP 2. Surgical [P], colon, ascending x1, polyp (1) - TUBULAR ADENOMA - NEGATIVE FOR HIGH-GRADE DYSPLASIA OR MALIGNANCY   PH impedence study 07/25/2019: 1.  Normal esophageal acid exposure with % time < 4 of 0.5% 2.  Esophageal acid exposure is normal in upright and supine position 3.  Overall reflux events within normal limits 4   Positive symptom correlation for reflux events associated with belching based on symptom association probability Impression No evidence of significant pathogenic gastroesophageal acid reflux Very few reflux episodes were noted and were predominantly triggered by excessive belching   EGD 04/02/2019: - Esophagogastric landmarks identified. - 1-2 cm sliding hiatal hernia. - Normal esophagus otherwise - empiric dilation performed to 18mm - Normal stomach. Hill grade III views of the cardia. - Normal duodenal bulb and second portion of the duodenum.   Barium swallow study 10/13/2015: Small reducible hiatal hernia with small amount of gastroesophageal reflux.  There is no evidence of a stricture nor of esophagitis.   EGD 01/21/2015: Normal appearing esophagus. Biopsies obtained to rule out EoE. Empiric dilation performed with 18mm TTS balloon given prior positive response to dilation, in which no resistance was noted and no mucosal wrent appreciated Benign appearing small gastric polyp, removed Normal remainder of examined stomach, biopsies obtained to rule out H pylori. Normal duodenum   EGD 07/24/2012: GERD Esophageal dilatation   EGD 10/12/2010: -Normal stomach -Normal duodenum   Colonoscopy 10/12/2010: 4 polyps removed from the recto sigmoid and rectum    Part A: GEJ BIOPSY:  - SQUAMOCOLUMNAR MUCOSA WITH REFLUX GASTROESOPHAGITIS.  - NEGATIVE FOR GOBLET CELLS, DYSPLASIA AND MALIGNANCY.  .  Part B: RECTO-SIGMOID JUNCTION POLYP X 2 COLD BIOPSY:  - HYPERPLASTIC POLYP (2).  - NEGATIVE FOR DYSPLASIA AND MALIGNANCY.  .  Part C: RECTAL VAULT POLYPS X 2 COLD BIOPSY:  - HYPERPLASTIC POLYP (4).  - NEGATIVE FOR DYSPLASIA AND MALIGNANCY  Past Medical History:  Diagnosis Date   Allergy    Anemia    Arthritis    knees - no meds   Colon polyps    HPP_ 2012   GERD (gastroesophageal reflux disease) 10/12/2010   EGD, positive H. pylori   HSV infection    History   Post-operative nausea and vomiting    Seasonal allergies     Past Surgical History:  Procedure Laterality Date   37 HOUR PH STUDY N/A 07/25/2019   Procedure: 24 HOUR PH STUDY;  Surgeon: San Sandor GAILS, DO;  Location: [facility];  Service: Gastroenterology;  Laterality: N/A;   ABDOMINAL HYSTERECTOMY     BALLOON DILATION N/A 07/24/2012   Procedure: BALLOON DILATION;  Surgeon: Lamar JONETTA Aho, MD;  Location: [facility];  Service: Endoscopy;  Laterality: N/A;   BRAVO PH STUDY N/A 07/24/2012   Procedure: BRAVO PH STUDY;  Surgeon: Lamar JONETTA Aho, MD;  Location: [facility];  Service: Endoscopy;  Laterality: N/A;   COLONOSCOPY     DILATION AND CURETTAGE OF UTERUS      SAB   ESOPHAGEAL MANOMETRY N/A 07/25/2019   Procedure: ESOPHAGEAL MANOMETRY (EM);  Surgeon: San Sandor GAILS, DO;  Location: [facility];  Service: Gastroenterology;  Laterality: N/A;   ESOPHAGOGASTRODUODENOSCOPY N/A 07/24/2012   Procedure: ESOPHAGOGASTRODUODENOSCOPY (EGD);  Surgeon: Lamar JONETTA Aho, MD;  Location: [facility];  Service: Endoscopy;  Laterality: N/A;   OVARIAN CYST REMOVAL  2004   laparotomy -left   ROBOTIC ASSISTED LAPAROSCOPIC LYSIS OF ADHESION N/A 03/21/2014   Procedure: ROBOTIC ASSISTED LAPAROSCOPIC EXTENSIVE LYSIS OF ADHESION (1 Hour);  Surgeon: Dickie DELENA Carder, MD;  Location: [facility];  Service: Gynecology;  Laterality: N/A;   ROBOTIC ASSISTED SALPINGO OOPHERECTOMY Left 03/21/2014   Procedure:  ROBOTIC ASSISTED LEFT OOPHORECTOMY;  Surgeon: Dickie DELENA Carder, MD;  Location: [facility];  Service: Gynecology;  Laterality: Left;   rotator cuff surgery  2017   SHOULDER ARTHROSCOPY Left    TENNIS ELBOW RELEASE/NIRSCHEL PROCEDURE Right 05/01/2015   Procedure: RIGHT ELBOW DEBRIDEMENT AND TENDON REPAIR;  Surgeon: Toribio JULIANNA Chancy, MD;  Location: [facility];  Service: Orthopedics;  Laterality: Right;   UPPER GASTROINTESTINAL ENDOSCOPY     WISDOM TOOTH EXTRACTION      Current Outpatient Medications  Medication Sig Dispense Refill   acyclovir  (ZOVIRAX ) 400 MG tablet TAKE 1 TABLET BY MOUTH TWICE A DAY AS NEEDED 60 tablet 0   azelastine  (ASTELIN ) 0.1 % nasal spray Place 1 spray into both nostrils 2 (two) times daily. Use in each nostril as directed 30 mL 0   benzonatate  (TESSALON ) 100 MG capsule Take 2 capsules (200 mg total) by mouth every 8 (eight) hours. 21 capsule 0   Calcium Carbonate-Vitamin D 600-400 MG-UNIT tablet Take 1 tablet by mouth daily.     dicyclomine  (BENTYL ) 10 MG capsule Take 1 capsule (10 mg total) by mouth every 8 (eight) hours as needed. 30 capsule 0   diphenhydrAMINE (BENADRYL) 12.5 MG/5ML elixir Take by mouth as needed.     famotidine   (PEPCID ) 40 MG tablet TAKE 1 TABLET BY MOUTH TWICE A DAY 60 tablet 8   ferrous sulfate 325 (65 FE) MG tablet Take 325 mg by mouth daily.      fluticasone  (FLONASE ) 50 MCG/ACT nasal spray Place 2 sprays into both nostrils daily. To use in place of your Nasonex  16 mL 0   ipratropium (ATROVENT ) 0.06 % nasal spray Place 2 sprays into both nostrils 4 (four) times daily. 15 mL 12   levocetirizine (XYZAL ) 5 MG tablet TAKE 1 TABLET (5 MG TOTAL) BY MOUTH DAILY. 30 tablet 11   meloxicam (MOBIC) 15 MG tablet Take 15 mg by mouth daily.     promethazine -dextromethorphan (PROMETHAZINE -DM) 6.25-15 MG/5ML syrup Take 5 mLs by mouth 4 (four) times daily as needed. 118 mL 0   vitamin E 400 UNIT capsule Take 400 Units by mouth daily. Reported on 10/09/2015     Vonoprazan Fumarate 20 MG TABS Take 20 mg by mouth daily.     No current facility-administered medications for this visit.    Allergies as of 02/29/2024 - Review Complete 11/17/2023  Allergen Reaction Noted   Adhesive [tape] Other (See Comments) 10/15/2011   Aspirin Nausea  And Vomiting 09/06/2011   Ibuprofen Nausea And Vomiting 02/08/2012   Penicillins Diarrhea 02/08/2012   Nsaids Other (See Comments) 10/04/2011   Oxycodone  Other (See Comments) 02/08/2012   Shrimp extract Other (See Comments) 10/21/2011   Shrimp [shellfish allergy] Nausea And Vomiting 10/21/2011    Family History  Problem Relation Age of Onset   Arthritis Mother    Stroke Mother    Hypertension Mother    Heart failure Mother    Dementia Mother    Lung cancer Father    Diabetes Sister    Diabetes Brother        x 2   Breast cancer Maternal Aunt    Throat cancer Maternal Aunt        Smoker   Esophageal cancer Maternal Aunt    Prostate cancer Maternal Uncle        x 2   Diabetes Maternal Uncle    Diabetes Paternal Grandmother    Diabetes Paternal Grandfather    Hypertension Other    Hyperlipidemia Other    Colon cancer Neg Hx    Stomach cancer Neg Hx    Rectal cancer  Neg Hx    Colon polyps Neg Hx     Social History   Socioeconomic History   Marital status: Married    Spouse name: Not on file   Number of children: 0   Years of education: Not on file   Highest education level: Associate degree: academic program  Occupational History   Occupation: RESEARCH    Employer: [employer]  Tobacco Use   Smoking status: Never   Smokeless tobacco: Never  Vaping Use   Vaping status: Never Used  Substance and Sexual Activity   Alcohol use: Not Currently    Comment: occassional   Drug use: No   Sexual activity: Yes    Birth control/protection: None, Surgical    Comment: hysterectomy  Other Topics Concern   Not on file  Social History Narrative   Teaching Laboratory Technician    Social Drivers of Health   Financial Resource Strain: Low Risk  (07/20/2023)   Overall Financial Resource Strain (CARDIA)    Difficulty of Paying Living Expenses: Not hard at all  Food Insecurity: No Food Insecurity (07/20/2023)   Hunger Vital Sign    Worried About Running Out of Food in the Last Year: Never true    Ran Out of Food in the Last Year: Never true  Transportation Needs: No Transportation Needs (07/20/2023)   PRAPARE - Administrator, Civil Service (Medical): No    Lack of Transportation (Non-Medical): No  Physical Activity: Insufficiently Active (07/20/2023)   Exercise Vital Sign    Days of Exercise per Week: 1 day    Minutes of Exercise per Session: 30 min  Stress: No Stress Concern Present (07/20/2023)   [employer] of Occupational Health - Occupational Stress Questionnaire    Feeling of Stress : Not at all  Social Connections: Socially Integrated (07/20/2023)   Social Connection and Isolation Panel    Frequency of Communication with Friends and Family: More than three times a week    Frequency of Social Gatherings with Friends and Family: Once a week    Attends Religious Services: More than 4 times per year    Active Member of [employer] or  Organizations: Yes    Attends Engineer, Structural: More than 4 times per year    Marital Status: Married  Catering Manager Violence: Not on file  Review of Systems:    Constitutional: No weight loss, fever, chills, weakness or fatigue HEENT: Eyes: No change in vision               Ears, Nose, Throat:  No change in hearing or congestion Skin: No rash or itching Cardiovascular: No chest pain, chest pressure or palpitations   Respiratory: No SOB or cough Gastrointestinal: See HPI and otherwise negative Genitourinary: No dysuria or change in urinary frequency Neurological: No headache, dizziness or syncope Musculoskeletal: No new muscle or joint pain Hematologic: No bleeding or bruising Psychiatric: No history of depression or anxiety    Physical Exam:  Vital signs: LMP 10/06/2011   Constitutional: NAD, alert and cooperative Head:  Normocephalic and atraumatic. Eyes:   PEERL, EOMI. No icterus. Conjunctiva pink. Respiratory: Respirations even and unlabored. Lungs clear to auscultation bilaterally.   No wheezes, crackles, or rhonchi.  Cardiovascular:  Regular rate and rhythm. No peripheral edema, cyanosis or pallor.  Gastrointestinal:  Soft, nondistended, nontender. No rebound or guarding. Normal bowel sounds. No appreciable masses or hepatomegaly. Rectal:  Declines Msk:  Symmetrical without gross deformities. Without edema, no deformity or joint abnormality.  Neurologic:  Alert and  oriented x4;  grossly normal neurologically.  Skin:   Dry and intact without significant lesions or rashes. Psychiatric: Oriented to person, place and time. Demonstrates good judgement and reason without abnormal affect or behaviors.  Physical Exam    RELEVANT LABS AND IMAGING: CBC    Component Value Date/Time   WBC 2.8 (L) 07/21/2023 0741   RBC 4.67 07/21/2023 0741   HGB 14.3 07/21/2023 0741   HCT 43.0 07/21/2023 0741   PLT 200.0 07/21/2023 0741   MCV 92.1 07/21/2023 0741   MCH  30.4 06/20/2020 0721   MCHC 33.3 07/21/2023 0741   RDW 14.6 07/21/2023 0741   LYMPHSABS 1.4 07/21/2023 0741   MONOABS 0.3 07/21/2023 0741   EOSABS 0.0 07/21/2023 0741   BASOSABS 0.0 07/21/2023 0741    CMP     Component Value Date/Time   NA 140 07/21/2023 0741   K 4.3 07/21/2023 0741   CL 101 07/21/2023 0741   CO2 30 07/21/2023 0741   GLUCOSE 87 07/21/2023 0741   BUN 9 07/21/2023 0741   CREATININE 0.83 07/21/2023 0741   CALCIUM 9.4 07/21/2023 0741   PROT 7.3 07/21/2023 0741   ALBUMIN 4.5 07/21/2023 0741   AST 17 07/21/2023 0741   ALT 14 07/21/2023 0741   ALKPHOS 66 07/21/2023 0741   BILITOT 0.6 07/21/2023 0741   GFRNONAA >60 06/20/2020 0721   GFRAA >60 10/09/2015 1500     Assessment/Plan:   Pancreatic tail cyst CTAP with contrast 03/18/2023 with 15 mm cyst in tail of pancreas and gastric wall thickening versus underdistention.  MRCP 02/03/2023 showed pancreatic tail cyst measuring 2.0 x 2.0 cm without inflammatory changes or PD dilation consistent with benign sidebranch IPMN and recommended repeat MRI in 12 months  Upper abdominal pain PPIs resulted in increased reflux symptoms.  Put on famotidine  40 mg twice daily and dicyclomine  January 2025.  EGD 05/2023 with H. pylori negative gastritis, 2 cm hiatal hernia, otherwise normal.  Put on voquezna.   History of colon polyps.  Colonoscopy 01/05/2021 identified 3 tubular adenomatous/hyperplastic polyps removed from the ascending and rectosigmoid colon.  -Next colonoscopy due 01/2028  Nestor Blower, PA-C [facility] 02/28/2024, 8:33 AM  Cc: Glendia Shad, MD

## 2024-02-29 ENCOUNTER — Ambulatory Visit: Admitting: Gastroenterology

## 2024-02-29 ENCOUNTER — Encounter: Payer: Self-pay | Admitting: Gastroenterology

## 2024-02-29 ENCOUNTER — Encounter: Payer: Self-pay | Admitting: Internal Medicine

## 2024-02-29 VITALS — BP 124/78 | HR 100 | Ht 65.0 in | Wt 202.0 lb

## 2024-02-29 DIAGNOSIS — Z860100 Personal history of colon polyps, unspecified: Secondary | ICD-10-CM | POA: Diagnosis not present

## 2024-02-29 DIAGNOSIS — K862 Cyst of pancreas: Secondary | ICD-10-CM | POA: Diagnosis not present

## 2024-02-29 DIAGNOSIS — R1010 Upper abdominal pain, unspecified: Secondary | ICD-10-CM | POA: Diagnosis not present

## 2024-02-29 NOTE — Patient Instructions (Addendum)
 _______________________________________________________  If your blood pressure at your visit was 140/90 or greater, please contact your primary care physician to follow up on this.  _______________________________________________________  If you are age 58 or older, your body mass index should be between 23-30. Your Body mass index is 33.61 kg/m. If this is out of the aforementioned range listed, please consider follow up with your Primary Care Provider.  If you are age 52 or younger, your body mass index should be between 19-25. Your Body mass index is 33.61 kg/m. If this is out of the aformentioned range listed, please consider follow up with your Primary Care Provider.   ________________________________________________________  The [facility] providers would like to encourage you to use MYCHART to communicate with providers for non-urgent requests or questions.  Due to long hold times on the telephone, sending your provider a message by [facility] may be a faster and more efficient way to get a response.  Please allow 48 business hours for a response.  Please remember that this is for non-urgent requests.  _______________________________________________________  [facility] is using a team-based approach to care.  Your team is made up of your doctor and two to three APPS. Our APPS (Nurse Practitioners and Physician Assistants) work with your physician to ensure care continuity for you. They are fully qualified to address your health concerns and develop a treatment plan. They communicate directly with your gastroenterologist to care for you. Seeing the Advanced Practice Practitioners on your physician's team can help you by facilitating care more promptly, often allowing for earlier appointments, access to diagnostic testing, procedures, and other specialty referrals.   You have been scheduled for an MRI at [facility] on March 06 2024. Your appointment time is 1pm. Please  arrive to admitting (at main entrance of the hospital) 30 minutes prior to your appointment time for registration purposes. Please make certain not to have anything to eat or drink 4 hours prior to your test. In addition, if you have any metal in your body, have a pacemaker or defibrillator, please be sure to let your ordering physician know. This test typically takes 45 minutes to 1 hour to complete. Should you need to reschedule, please call [phone] to do so.    FODMAP stands for fermentable oligo-, di-, mono-saccharides and polyols (1). These are the scientific terms used to classify groups of carbs that are difficult for our body to digest and that are notorious for triggering digestive symptoms like bloating, gas, loose stools and stomach pain.   You can try low FODMAP diet  - start with eliminating just one column at a time that you feel may be a trigger for you. - the table at the very bottom contains foods that are low in FODMAPs   Sometimes trying to eliminate the FODMAP's from your diet is difficult or tricky, if you are stuggling with trying to do the elimination diet you can try an enzyme.  There is a food enzymes that you sprinkle in or on your food that helps break down the FODMAP. You can read more about the enzyme by going to this site: https://fodzyme.com/

## 2024-03-01 NOTE — Progress Notes (Signed)
 Agree with assessment and plan as outlined.

## 2024-03-06 ENCOUNTER — Ambulatory Visit (HOSPITAL_COMMUNITY)

## 2024-03-08 ENCOUNTER — Other Ambulatory Visit: Payer: Self-pay | Admitting: Gastroenterology

## 2024-03-08 ENCOUNTER — Ambulatory Visit (HOSPITAL_COMMUNITY)
Admission: RE | Admit: 2024-03-08 | Discharge: 2024-03-08 | Disposition: A | Source: Ambulatory Visit | Attending: Gastroenterology

## 2024-03-08 ENCOUNTER — Ambulatory Visit: Payer: Self-pay | Admitting: Gastroenterology

## 2024-03-08 DIAGNOSIS — K862 Cyst of pancreas: Secondary | ICD-10-CM

## 2024-03-08 MED ORDER — GADOBUTROL 1 MMOL/ML IV SOLN
9.0000 mL | Freq: Once | INTRAVENOUS | Status: AC | PRN
  Administered 2024-03-08: 9 mL via INTRAVENOUS

## 2024-03-30 ENCOUNTER — Other Ambulatory Visit: Payer: Self-pay | Admitting: Nurse Practitioner
